# Patient Record
Sex: Male | Born: 1935 | Race: White | Hispanic: No | Marital: Married | Smoking: Former smoker
Health system: Southern US, Community
[De-identification: ages and names within clinical notes are randomized; demographics above are authoritative.]

## PROBLEM LIST (undated history)

## (undated) DIAGNOSIS — N400 Enlarged prostate without lower urinary tract symptoms: Secondary | ICD-10-CM

## (undated) DIAGNOSIS — R29898 Other symptoms and signs involving the musculoskeletal system: Secondary | ICD-10-CM

## (undated) DIAGNOSIS — M51379 Other intervertebral disc degeneration, lumbosacral region without mention of lumbar back pain or lower extremity pain: Secondary | ICD-10-CM

## (undated) DIAGNOSIS — F0150 Vascular dementia without behavioral disturbance: Secondary | ICD-10-CM

## (undated) DIAGNOSIS — J309 Allergic rhinitis, unspecified: Secondary | ICD-10-CM

## (undated) DIAGNOSIS — E785 Hyperlipidemia, unspecified: Secondary | ICD-10-CM

## (undated) DIAGNOSIS — I10 Essential (primary) hypertension: Secondary | ICD-10-CM

## (undated) DIAGNOSIS — I2510 Atherosclerotic heart disease of native coronary artery without angina pectoris: Secondary | ICD-10-CM

## (undated) DIAGNOSIS — Z973 Presence of spectacles and contact lenses: Secondary | ICD-10-CM

## (undated) DIAGNOSIS — I639 Cerebral infarction, unspecified: Secondary | ICD-10-CM

## (undated) DIAGNOSIS — E119 Type 2 diabetes mellitus without complications: Secondary | ICD-10-CM

## (undated) DIAGNOSIS — Z951 Presence of aortocoronary bypass graft: Secondary | ICD-10-CM

## (undated) DIAGNOSIS — H9190 Unspecified hearing loss, unspecified ear: Secondary | ICD-10-CM

## (undated) DIAGNOSIS — M5137 Other intervertebral disc degeneration, lumbosacral region: Secondary | ICD-10-CM

## (undated) HISTORY — PX: EYE SURGERY: SHX253

## (undated) HISTORY — DX: Hyperlipidemia, unspecified: E78.5

## (undated) HISTORY — PX: OTHER SURGICAL HISTORY: SHX169

## (undated) HISTORY — DX: Other intervertebral disc degeneration, lumbosacral region: M51.37

## (undated) HISTORY — PX: LAMINECTOMY: SHX219

## (undated) HISTORY — DX: Type 2 diabetes mellitus without complications: E11.9

## (undated) HISTORY — PX: HEMORRHOID SURGERY: SHX153

## (undated) HISTORY — DX: Atherosclerotic heart disease of native coronary artery without angina pectoris: I25.10

## (undated) HISTORY — DX: Allergic rhinitis, unspecified: J30.9

## (undated) HISTORY — DX: Cerebral infarction, unspecified: I63.9

## (undated) HISTORY — PX: TRANSURETHRAL RESECTION OF PROSTATE: SHX73

## (undated) HISTORY — PX: ROTATOR CUFF REPAIR: SHX139

## (undated) HISTORY — PX: COLONOSCOPY: SHX174

## (undated) HISTORY — DX: Other intervertebral disc degeneration, lumbosacral region without mention of lumbar back pain or lower extremity pain: M51.379

## (undated) HISTORY — DX: Benign prostatic hyperplasia without lower urinary tract symptoms: N40.0

## (undated) HISTORY — PX: TONSILLECTOMY: SUR1361

## (undated) HISTORY — DX: Essential (primary) hypertension: I10

## (undated) HISTORY — DX: Other symptoms and signs involving the musculoskeletal system: R29.898

---

## 2001-05-11 ENCOUNTER — Encounter: Payer: Self-pay | Admitting: Urology

## 2001-05-18 ENCOUNTER — Encounter (INDEPENDENT_AMBULATORY_CARE_PROVIDER_SITE_OTHER): Payer: Self-pay | Admitting: Specialist

## 2001-05-18 ENCOUNTER — Inpatient Hospital Stay (HOSPITAL_COMMUNITY): Admission: RE | Admit: 2001-05-18 | Discharge: 2001-05-20 | Payer: Self-pay | Admitting: Urology

## 2001-05-31 ENCOUNTER — Encounter: Admission: RE | Admit: 2001-05-31 | Discharge: 2001-08-29 | Payer: Self-pay | Admitting: *Deleted

## 2004-10-12 DIAGNOSIS — Z951 Presence of aortocoronary bypass graft: Secondary | ICD-10-CM

## 2004-10-12 HISTORY — PX: CORONARY ARTERY BYPASS GRAFT: SHX141

## 2004-10-12 HISTORY — DX: Presence of aortocoronary bypass graft: Z95.1

## 2005-04-08 ENCOUNTER — Encounter: Admission: RE | Admit: 2005-04-08 | Discharge: 2005-04-08 | Payer: Self-pay | Admitting: *Deleted

## 2005-04-09 ENCOUNTER — Inpatient Hospital Stay (HOSPITAL_COMMUNITY): Admission: AD | Admit: 2005-04-09 | Discharge: 2005-04-21 | Payer: Self-pay | Admitting: *Deleted

## 2005-11-30 ENCOUNTER — Encounter: Admission: RE | Admit: 2005-11-30 | Discharge: 2005-11-30 | Payer: Self-pay | Admitting: Neurology

## 2005-12-25 ENCOUNTER — Encounter: Admission: RE | Admit: 2005-12-25 | Discharge: 2006-01-08 | Payer: Self-pay | Admitting: Neurology

## 2007-05-24 ENCOUNTER — Emergency Department (HOSPITAL_COMMUNITY): Admission: EM | Admit: 2007-05-24 | Discharge: 2007-05-24 | Payer: Self-pay | Admitting: Emergency Medicine

## 2007-06-17 ENCOUNTER — Encounter: Admission: RE | Admit: 2007-06-17 | Discharge: 2007-06-17 | Payer: Self-pay | Admitting: Internal Medicine

## 2011-02-27 NOTE — Consult Note (Signed)
NAME:  [name], [name]:  [id number]   MEDICAL RECORD NO.:  [id number]          PATIENT TYPE:  OIB   LOCATION:  2899                         FACILITY:  [facility]   PHYSICIAN:  Kerin Perna, M.D.  DATE OF BIRTH:  [birth year]   DATE OF CONSULTATION:  04/09/2005  DATE OF DISCHARGE:                                   CONSULTATION   PHYSICIAN REQUESTING CONSULTATION:  Meade Maw, M.D.   PRIMARY CARE PHYSICIAN:  Thora Lance, M.D.   REASON FOR CONSULTATION:  Exertional class III angina with severe three-  vessel coronary artery disease and preserved left ventricular function.   CHIEF COMPLAINT:  Shortness of breath.   HISTORY OF PRESENT ILLNESS:  I was asked to evaluate this 77 year old white  male for potential coronary revascularization for recently diagnosed severe  three-vessel disease.  The patient presented to his physician with a  complaint of dyspnea on exertion.  This has been increasingly severe with  activities such has yard work and walking his dog.  His most severe episode  was when he rushed home due to rain while he was out walking.  He had no  associated chest pain, nausea, diaphoresis, syncope or symptoms of CHF.  He  has had no resting symptoms of shortness of breath.  His coronary risk  factors are positive for diet-controlled diabetes, hyperlipidemia,  hypertension and a remote history of smoking (he stopped in 1958).  He is  involved in a regular exercise program and has remained well conditioned  over the years.   PAST MEDICAL HISTORY:  1.  Hypertension.  2.  Diet-controlled diabetes.  3.  Dyslipidemia.   ALLERGIES:  No known drug allergies.   PAST SURGICAL HISTORY:  1.  Left rotator cuff surgery.  2.  Lumbar disk laminectomy.  3.  Inguinal hernia repair.  4.  Bilateral carpal tunnel repair.  5.  Hemorrhoidectomy.  6.  TURP for benign prostatic hypertrophy.   SOCIAL HISTORY:  The patient is a retired Quarry manager for  [employer].  He  is married.  He stopped smoking in 1958.  He does not use alcohol.  He is a  previous Engineer, production as well.   FAMILY HISTORY:  Positive for colon cancer and diabetes.  Negative for CAD.   REVIEW OF SYSTEMS:  CONSTITUTIONAL:  Review is negative for fever or weight  loss.  EARS, NOSE, AND THROAT:  Review is negative for differential  swallowing.  He has upper dental plates and partial lower plate.  THORACIC:  Review is negative for trauma or abnormal chest x-ray.  Positive for  shortness of breath.  Negative for hemoptysis.  CARDIAC:  Review is positive  for severe three-vessel coronary disease.  No prior history of MI or cardiac  murmur.  GASTROINTESTINAL:  Review is negative for blood per rectum,  hepatitis or jaundice.  UROLOGIC:  Review is positive for BPH, much improved  with TURP.  ENDOCRINE:  Review is positive for diet-controlled diabetes.  Negative for thyroid disease.  HEMATOLOGIC:  Review is negative for bleeding  disorder or blood transfusion.  MUSCULOSKELETAL:  Review is positive for  bilateral carpal tunnel syndrome treated surgically and bilateral rotator  cuff syndromes treated with surgery on the left and physical therapy on the  right.  VASCULAR:  Review is negative for DVT or claudication.  NEUROLOGIC:  Review is negative for stroke or seizure.   PHYSICAL EXAMINATION:  HEIGHT:  The patient is 5 feet 10 inches.  WEIGHT:  185 pounds.  VITAL SIGNS:  The blood pressure today is 168/74, pulse 75, respirations 18,  temperature 97.7 degrees and oxygen saturation on room air 96%.  GENERAL APPEARANCE:  A pleasant white male in the catheterization laboratory  holding area in no distress.  HEENT:  Normocephalic.  EOMs full.  Pharynx clear.  Dentition good.  NECK:  Without JVD, mass or carotid bruit.  LYMPHATICS:  No palpable supraclavicular or axillary adenopathy.  LUNGS:  Clear.  There is no thoracic deformity.  CARDIAC:  Regular rhythm without S3,  gallop, murmur or rub.  ABDOMEN:  Soft without pulsatile mass.  EXTREMITIES:  No cyanosis, clubbing or edema.  Peripheral pulses are 2+ in  all extremities.  SKIN:  Without rash or lesion.  NEUROLOGIC:  Alert and oriented without focal deficit.   LABORATORY DATA:  I reviewed the coronary arteriograms with Dr. Fraser Din and  agree with the impression of three-vessel coronary artery disease with total  occlusion of the right coronary and high-grade stenosis of the LAD, diagonal  and circumflex vessels.   His hemoglobin A1c is 7.7.  His BUN and creatinine are 11 and 0.7.  Hematocrit 41%.   IMPRESSION AND PLAN:  The patient has severe coronary artery disease and has  restricted activity levels from his exertional dyspnea, which is anginal  equivalent.  He would benefit from surgical revascularization to preserve LV  function and to improve his symptoms and long-term survival.  He is  agreeable to proceed with surgery, which  will be scheduled for Monday, April 13, 2005.  I have discussed the  indications, alternatives and risks of surgery with the patient.  He  understands and agrees to proceed.   Thank you for the consultation.       PV/MEDQ  D:  04/09/2005  T:  04/09/2005  Job:  [id number]   cc:   CVTS Office   Sigmund I. Patsi Sears, M.D.  [address]  [place]  [place] [address]  Fax: [phone]   [facility]

## 2011-02-27 NOTE — Consult Note (Signed)
NAME:  Jonathan [name], Jonathan [name] NO.:  [id number]   MEDICAL RECORD NO.:  [id number]          PATIENT TYPE:  INP   LOCATION:  2006                         FACILITY:  [facility]   PHYSICIAN:  Lebron Conners, M.D.   DATE OF BIRTH:  [birth year]   DATE OF CONSULTATION:  04/17/2005  DATE OF DISCHARGE:                                   CONSULTATION   REASON FOR CONSULTATION:  Gallstones.   HISTORY OF PRESENT ILLNESS:  Jonathan [name] is a 77 year old male patient who  underwent coronary artery bypass grafting on April 13, 2005.  Prior to  surgery, he initially complained of dyspnea on exertion.  He then underwent  a stress test followed by cardiac catheterization which resulted in his  bypass surgery.  Since surgery, the patient complains of abdominal pain,  more diffuse and nonspecific.  He did undergo an ultrasound today that  revealed cholelithiasis but no evidence of cholecystitis.  In addition, the  patient continued to have a postoperative fever with a chest x-ray showing  left basal atelectasis versus pneumonia in association with leukocytosis.  Currently, he is on Maxipime.   ALLERGIES:  No known drug allergies.   MEDICATIONS:  Aspirin, Maxipime, insulin, Prinivil 5 mg daily, Lopressor 50  mg b.i.d., Protonix 40 mg daily.   SOCIAL HISTORY:  Former tobacco, no illicit drug use or alcohol use.  He is  married, retired from [employer], lives in [place].   FAMILY HISTORY:  Diabetes mellitus and colon cancer.   PAST MEDICAL HISTORY:  Coronary artery disease as above, diabetes mellitus,  hypertension, dyslipidemia, BPH.   PAST SURGICAL HISTORY:  TURP, left rotator cuff surgery, lumbar laminectomy,  inguinal hernia repair, bilateral carpal tunnel surgery as well as  hemorrhoidectomy.   REVIEW OF SYMPTOMS:  Tenderness along the incision site, abdominal symptoms  as above, otherwise negative.   LABORATORY DATA:  LFTs normal, BMP normal.  Hemoglobin 10.4, hematocrit  30.1, white  count 16.7 (decreasing).  Chest x-ray reveals wet basilar  atelectasis versus pneumonia.   PHYSICAL EXAMINATION:  VITAL SIGNS:  Temperature 101.l, pulse 115, respirations 20, blood pressure  152/89, O2 saturation is 93% on room air.  HEENT:  No nares discharge.  Conjunctivae normal, sclerae clear.  NECK:  No carotid bruits, no JVD, no thyromegaly.  HEART:  Regular rate and rhythm, no murmurs, rubs, or ectopy.  CHEST:  Crackles in the bases bilaterally.  ABDOMEN:  Soft, there is some right upper quadrant tenderness, although he  seems to be diffusely tender.  EXTREMITIES:  No peripheral edema.  NEUROLOGICAL:  Grossly intact.  SKIN:  Warm and dry.  Incision of the sternum is healing without problems.   ASSESSMENT AND PLAN:  1.  Asymptomatic cholelithiasis.  2.  Fever.  3.  Leukocytosis.  4.  Left lung pneumonia.  5.  Coronary artery disease status post ABG.  6.  Hypertension.  7.  Hyperlipidemia.  8.  Diabetes mellitus.  9.  History of BPH status post TURP.  10. Status post left rotator cuff surgery.  11. Status post lumbar laminectomy.  12. Inguinal hernia repair.  13. Bilateral carpal tunnel surgeries.  14. History of hemorrhoidectomy.   The patient was seen and examined by Dr. Lebron Conners.  At this point, we  will continue to follow the patient while he is an inpatient to see if his  symptoms change, however, we would plan on seeing the patient as an  outpatient on May 18, 2005, at 9 a.m.  At this point, he will hopefully  recover from his pneumonia and be far enough out from his bypass surgery  that we could go ahead and proceed with cholecystectomy.  If the patient  becomes symptomatic, we can address taking him to the operating room prior  to this time.  Thank you for the consult.       LB/MEDQ  D:  04/17/2005  T:  04/17/2005  Job:  [id number]

## 2011-02-27 NOTE — Op Note (Signed)
NAME:  Jonathan [name], Jonathan [name] NO.:  [id number]   MEDICAL RECORD NO.:  [id number]          PATIENT TYPE:  INP   LOCATION:  2309                         FACILITY:  [facility]   PHYSICIAN:  Kerin Perna, M.D.  DATE OF BIRTH:  Oct 27, 1928   DATE OF PROCEDURE:  04/13/2005  DATE OF DISCHARGE:                                 OPERATIVE REPORT   OPERATION:  Coronary artery bypass grafting x 4 (left internal mammary  artery to LAD, saphenous vein graft to poster descending, sequential  saphenous vein graft to OM1 and the OM2).   PRE AND POSTOPERATIVE DIAGNOSIS:  Class IV unstable angina with severe three-  vessel coronary disease.   SURGEON:  Kerin Perna, M.D.   ASSISTANT:  Coral Ceo, PA-C   ANESTHESIA:  General by Dr. Adonis Huguenin   INDICATIONS:  The patient is a 77 year old diabetic who presents with  unstable angina. Cardiac catheterization by Dr. Fraser Din demonstrated severe  three-vessel coronary disease including total occlusion of the right  coronary and 95% stenosis of the LAD. He was not felt to be candidate for  percutaneous intervention and surgical evaluation was requested.   Prior to surgery, I examined the patient in his hospital room and reviewed  the results of the cardiac catheterization with the patient and family. I  discussed the indications and expected benefits of coronary bypass surgery  for treatment of his coronary artery disease. I reviewed the major aspects  of planned procedure including choice of conduit for grafts, location of the  surgical incisions, use of general anesthesia and cardiopulmonary bypass,  and expected postoperative hospital recovery. I discussed with the patient  the risks to him of coronary bypass surgery as well as the alternatives to  surgical therapy. I discussed with him the potential risks of MI, CVA,  bleeding, infection, blood transfusion requirement, and death. He understood  these implications for the surgery and  agreed to proceed with operation as  planned under what I felt was an informed consent.   OPERATIVE FINDINGS:  The mammary artery was a small vessel but was trimmed  proximally to allow adequate flow. The LAD had proximal high-grade calcified  lesions. The circumflex was intramyocardial. The right coronary was totally  occluded but the posterior descending was adequate target. The vein was  harvested endoscopically and was good conduit. No blood transfusions  required. The aprotinin protocol was used for this operation.   PROCEDURE:  The patient was brought to operative placed on the operating  room table. General anesthesia was induced under invasive hemodynamic  monitoring. The chest, abdomen and legs were prepped with Betadine and  draped as a sterile field. A sternal incision was made as the saphenous vein  was harvested endoscopically. Left internal mammary artery was harvested as  a pedicle graft from its origin at the subclavian vessels. Heparin was  administered and ACT was documented as being therapeutic. The sternal  retractor was placed and the pericardium was opened and suspended.  Pursestrings placed in the ascending aorta and right atrium and the patient  was cannulated and placed on bypass. The  coronaries were identified for  grafting and the mammary artery and vein grafts were prepared for the distal  anastomoses. Cardioplegia catheters were placed for both antegrade aortic  and retrograde coronary sinus cardioplegia. The patient was cooled to 32  degrees. Aortic crossclamp was applied. 800 mL of cold blood cardioplegia  was delivered in split doses between the antegrade aortic and retrograde  coronary sinus catheters. There is good cardioplegic arrest and septal  temperature dropped less than 12 degrees. Topical iced saline was used to  augment myocardial preservation and a pericardial insulator pad was used  protect left phrenic nerve.   The distal coronary anastomoses  were then performed. The first distal  anastomosis was to the posterior descending. This was a 1.7 mm vessel with  proximal total occlusion. Reverse saphenous vein was sewn end-to-side with  running 7-0 Prolene with good flow through graft. The second distal  anastomosis was the OM1. There is a small 1.5 mm vessel proximal 90%  stenosis. A side-to-side anastomosis with the vein was sewn using running 7-  0 Prolene. The third distal anastomosis was continuation of the sequential  vein graft to the OM2. The OM2 was a larger 1.7 mm vessel with proximal 90%  stenosis.  The end of the saphenous vein was sewn end-to-side with running 7-  0 Prolene and there was good flow through graft. Cardioplegia was redosed.  The fourth distal anastomosis was placed in the mid LAD beyond the high-  grade calcified stenosis. Left internal mammary artery pedicle was trimmed  back to an adequate size and flow and was sewn end-to-side with running 8-0  Prolene. The mammary pedicle had been placed through an opening in the left  lateral pericardium and brought down onto the LAD. The mammary artery was  briefly flashed by releasing the pedicle bulldog and there was a median  increase in septal temperature. The bulldog was replaced in the pericardium  and the pedicle was secured to the epicardium. Cardioplegia was redosed.   With  still in place, two proximal vein anastomoses were placed in the  ascending aorta using a 4.5 mm punch with running 6-0 Prolene. Prior to  release of the crossclamp, air was removed from the coronaries and left side  of the heart using a dose of retrograde warm blood cardioplegia and the  usual de-airing maneuvers. The head was dropped in deep Trendelenburg and  the crossclamp was removed as the proximal anastomosis was tied.   The heart was cardioverted back to a regular rhythm after air was aspirated  from the vein grafts with 27 gauge needle. The retrograde cardioplegia catheter was  removed. The patient was rewarmed and reperfused. Temporary  pacing wires were applied. The lungs re-expanded and the ventilator was  resumed. The patient was weaned from bypass without inotropes. Blood  pressure and cardiac output were stable. Protamine was administered without  adverse reaction. The cannulas were removed. The mediastinum was irrigated  with warm antibiotic irrigation. Leg incision was irrigated and closed in  standard fashion. The pericardium was loosely reapproximated. Two  mediastinal and a left pleural chest tube were placed and brought out  through separate incisions. The sternum was closed with interrupted steel  wire. The pectoralis fascia was closed in running #1 Vicryl. The  subcutaneous and skin were closed in running Vicryl and sterile dressings  were applied. Total bypass time was 108 minutes with crossclamp time of 70  minutes.       PV/MEDQ  D:  04/13/2005  T:  04/13/2005  Job:  [id number]   cc:   [facility]

## 2011-02-27 NOTE — H&P (Signed)
[facility]  Patient:    Jonathan [name], Jonathan [name]                     MRN: [id number] Adm. Date:  [id number] Attending:  Lauree Chandler                         History and Physical  HISTORY OF PRESENT ILLNESS:  This 77 year old white male has had a long history of bladder outlet obstructive symptoms.  He has been treated with Proscar and his prostate has shrunk significantly from 60 g down to 43 g, but despite that he still has significant bladder outlet obstructive symptoms.  He has bilobar hypertrophy on cystoscopy.  The PSA in April of this year was 0.59, but doubled because he was on Proscar and made it 1.18, which was just fine.  He has had multiple discussions with me about interventional procedures and he chose TUR of the prostate.  PAST MEDICAL HISTORY:  His past history includes recent onset of diabetes and he is now on Amaryl for that.  He watches his diet.  He also has a history of hypertension.  He has arthritis.  PAST SURGICAL HISTORY:  Previous surgeries include left inguinal hernia repair, right shoulder surgery, tonsillectomy, hemorrhoidectomy,  lumbar disk surgery, and bilateral carpal tunnel surgery.  MEDICATIONS: 1. Cozaar 50 mg daily. 2. Proscar 5 mg daily. 3. Aspirin which he recently discontinued. 4. Now he is on Amaryl 1 mg a day for diabetes.  ALLERGIES:  None.  SOCIAL HISTORY:  Tobacco:  None.  Alcohol:  Occasional.  FAMILY HISTORY:  Noncontributory.  REVIEW OF SYSTEMS:  Noted on health history form.  PHYSICAL EXAMINATION:  His blood pressure is 170/90, pulse 80, temperature 98.1 degrees, and respiratory rate 16.  GENERAL APPEARANCE:  He is alert and oriented.  He is in no acute distress.  SKIN:  Warm and dry.  NECK:  Supple.  CHEST:  Clear.  HEART:  Heart tones regular.  ABDOMEN:  Soft and nontender.  PELVIC:  External genitalia unremarkable.  RECTAL:  The prostate feels benign and smooth.  IMPRESSION: 1.  Benign prostatic hypertrophy. 2. Hypertension. 3. Diabetes mellitus. 4. Arthritis. DD:  05/18/01 TD:  05/18/01 Job: [id number]

## 2011-02-27 NOTE — Op Note (Signed)
[facility]  Patient:    Jonathan [name], Jonathan [name]                     MRN: [id number] Proc. Date: 05/18/01 Adm. Date:  [id number] Attending:  Lauree Chandler                           Operative Report  PREOPERATIVE DIAGNOSES:  Benign prostatic hypertrophy and bladder outlet obstruction.  POSTOPERATIVE DIAGNOSES:  Benign prostatic hypertrophy and bladder outlet obstruction.  PROCEDURE:  Transurethral resection of prostate.  SURGEON:  Maretta Bees. Vonita Moss, M.D.  ANESTHESIA:  General.  INDICATIONS:  This gentleman has had a long history of bladder outlet obstructive symptoms and has been on Proscar.  Flomax has not helped him very much.  He is interested in an interventional procedure and has opted for TUR of the prostate.  DESCRIPTION OF PROCEDURE:  The patient was brought to the operating room and placed in the lithotomy position.  After the induction of satisfactory general anesthesia, external genitalia were prepped and draped in the usual fashion. He was sounded from 70 to 44 [place].  A 28 French resectoscope sheath was then inserted.  The bladder had trabeculation but no stones or tumors.  He had bilobar hypertrophy.  A resection was performed of both lateral lobes. Despite being on Proscar, which had shrunk his prostate to 7 degrees, the prostate was still quite vascular.  Meticulous hemostasis was utilized, and the lateral lobes were resected down to capsule.  Prostatic floor was resected as was the very anterior wall.  This created a well-excavated prostatic fossa. Residual chips were removed from the bladder.  Ureteral orifices were intact. The external sphincter was intact, and there was good hemostasis, and the scope was removed.  A 24 French 30 cc Foley was placed in the bladder with clear irrigation after filling the balloon with 45 cc and placing it on traction.  He was taken to the recovery room in good condition.  Estimated blood loss was  less than 200 cc.  He tolerated the procedure well. DD:  05/18/01 TD:  05/18/01 Job: [id number]

## 2011-02-27 NOTE — Discharge Summary (Signed)
[facility]  Patient:    Jonathan [name], Jonathan [name]                     MRN: [id number] Adm. Date:  [id number] Disc. Date: [id number] Attending:  Lauree Chandler                           Discharge Summary  FINAL DIAGNOSES: 1. Benign prostatic hypertrophy and prostatism. 2. Hypertension. 3. Diabetes mellitus. 4. Arthritis.  PROCEDURE:  Transurethral resection of prostate.  HISTORY OF PRESENT ILLNESS:  This 77 year old white male has had a long history of bladder outlet obstructive symptoms.  He has been on Proscar, but still has voiding difficulties.  His PSA has been normal.  He also has had some recent onset of diabetes and a longer history of hypertension and arthritis.  PHYSICAL EXAMINATION:  Noncontributory.  His prostate felt benign.  HOSPITAL COURSE:  After admission, he underwent transurethral resection of prostate.  He had some bladder spasms postoperatively.  They were relieved when the Foley catheter was removed the day of discharge.  He will be discharged post-void.  If he has any problems with voiding, he will go home with a Foley catheter in place and have it removed in a couple of days.  DISCHARGE MEDICATIONS: 1. Cozaar 50 mg q.d. 2. He will finish up the last few Proscar tablets that he has. 3. He will continue on Amaryl 1 mg q.d. for diabetes.  DIET:  Continue diabetic diet.  ACTIVITY:  Light activity for a month.  CONDITION ON DISCHARGE:  He was sent home in good condition.  Final pathology was benign prostatic hypertrophy. DD:  05/20/01 TD:  05/20/01 Job: [id number]

## 2011-02-27 NOTE — Discharge Summary (Signed)
NAME:  Jonathan [name], Jonathan [name] NO.:  [id number]   MEDICAL RECORD NO.:  [id number]          PATIENT TYPE:  INP   LOCATION:  2006                         FACILITY:  [facility]   PHYSICIAN:  Kerin Perna, M.D.  DATE OF BIRTH:  09-03-1929   DATE OF ADMISSION:  04/09/2005  DATE OF DISCHARGE:                                 DISCHARGE SUMMARY   PRIMARY DIAGNOSIS:  Questionable unstable angina with severe three-vessel  coronary artery disease.   IN-HOSPITAL DIAGNOSES:  1.  Postoperative pneumonia.  2.  Cholelithiasis.  3.  Volume overload.   SECONDARY DIAGNOSES:  1.  Hypertension.  2.  Diet-controlled diabetes.  3.  Dyslipidemia.   ALLERGIES:  No known drug allergies.   IN-HOSPITAL OPERATIONS AND PROCEDURES:  1.  Cardiac catheterization.  2.  Coronary artery bypass grafting times four using a left internal mammary      artery to the left anterior descending, saphenous vein graft to the      posterior descending artery, sequential saphenous vein graft to the      first obtuse marginal and second obtuse marginal.   HISTORY AND PHYSICAL/HOSPITAL COURSE:  Jonathan [name] is a pleasant 77 year old  gentleman who presented to his physician with a complaint of dyspnea on  exertion. He says it is increasingly severe with activity just as yardwork  and walking. His most severe episode is when he rushed home due to rain when  he was out walking. He had no associated chest pain, nausea, diaphoresis,  syncope, or symptoms of seizure.  He has had no resting symptoms of  shortness of breath. His coronary risk factors are positive for diet-  controlled diabetes, hyperlipidemia, hypertension, and a remote history of  smoking which he stopped in 1958. He is involved in a regular exercise  program and has remained in well condition over the years. He underwent  cardiac catheterization on April 09, 2005, by Dr. Fraser Din which showed no  significant disease in the left  main artery. The left  anterior descending  revealed significant calcification in the proximal to mid portion. There is  one diffuse lesion in the LAD involving the proximal to mid portion. This  includes a small D-1, the second and third septal perforator. The circumflex  vessel had a large focal disease of 90%. The first lesion 90% is proximal  and involves the first obtuse marginal. The second lesion is in the mid  portion of the circumflex and involves the large second obtuse marginal.  There are left to right collaterals to the distal right coronary artery, is  100% proximally occluded. Ejection fraction is measured at 60% to 65%. Dr.  Donata Clay was then consulted. Dr. Donata Clay discussed undergoing coronary  artery bypass grafting for these lesions. He discussed risks and benefits of  the procedure. The patient acknowledged understanding and wished to proceed.  His surgery was then scheduled for April 13, 2005. He underwent preoperative  bilateral carotid duplex ultrasound revealing no evidence of ICA stenosis on  the right or left.  He also underwent preoperative ABIs which showed to be  within  normal limits. No complications during the patient's hospital stay  prior to undergoing surgery. He was taken to the operating room on April 13, 2005, where he underwent coronary artery bypass grafting times four with the  left internal mammary artery to the left anterior descending, saphenous vein  graft to the posterior descending artery, and sequential saphenous vein  grafting to the first branch of the obtuse marginal and second branch of the  obtuse marginal. The patient tolerated this procedure well and was  transferred up to the intensive care unit in stable condition. He was  extubated by the evening or early morning following surgery. He was deemed  to be hemodynamically stable in the immediate postoperative period. On  postoperative day one, the patient remained stable. Hematocrit was 27%,  creatinine stable  at 0.8. The patient was out of bed to chair. Lines and  chest tubes were discontinued. On postoperative day two the patient was  progressing well. He did show a low grade temperature of 99 degrees  Fahrenheit. White blood cell count was increased to 18.  The patient was  started on Ceftin prophylactically.  He was transferred out to 2000.  On  postoperative day three, the patient's white count increased further to 22.  He remained to have a low grade temperature of 99.2. Ceftin was continued.  Chest x-ray was ordered as well as UA and urine culture, and this was  followed. The patient was out of bed and ambulating well. Incisions were dry  and intact, healing well. The patient remained going in and out of sinus  tachycardia. CBGs were controlled with sliding scale and Lantus. On  postoperative day four, the patient had complaints of diaphoresis and nausea  post meals. He also complained of right upper quadrant pain. Ultrasound of  the gallbladder was done showed positive for cholelithiasis. His white count  did go down to 16.7, but he remained febrile at 101.1. General surgery was  consulted for evaluation. Chest x-ray did show questionable left lower lobe  pneumonia. General surgery evaluated patient on April 17, 2005, and felt that  he did not have cholecystitis and that the cholelithiasis can be taken care  of an outpatient and that his fever and white count was related to his  pneumonia. A follow-up appointment was scheduled with Dr. Orson Slick, per Dr.  Orson Slick. On postoperative day five, the patient was feeling better. He was  out of bed, walking around without difficulty. Appetite was improving. CBGs  were better controlled. White count was down to 13.9.  he did remain febrile  at 101.1. He was saturating at 91% on room air. Incisions were dry, intact,  and healing well. The patient was continued on antibiotics. The patient was  continued on antibiotics. On postoperative day six, the patient  was progressing well. White count was decreased to 13.3. Nausea had improved.  The patient was progressing tolerating diet well. Chest x-ray was improving.  On postoperative day seven, white blood cell count was within normal limits.  The patient was afebrile. He did remain in sinus tachycardia and Lopressor  was increased. The patient's CBGs were slightly under control, but diabetic  case management was consulted for suggestion of home p.o. medications.  Antibiotics were discontinued. The patient was out of bed and ambulating  well. The patient was discharged home on postoperative day eight in stable  condition. Incisions were dry, intact, and healing well. He remained in  regular rate and rhythm at discharge. He was 93%  to 94% on room air.  He was  tolerating a regular diet well with no nausea and no vomiting. He was out of  bed and ambulating well. A follow-up appointment will be scheduled with Dr.  Donata Clay in three weeks. The patient will follow up with Dr. Fraser Din in two  weeks. He will obtain a PA and lateral chest x-ray in Dr. Lindell Spar office  which he will then bring with him to Dr. Zenaida Niece Trigt's appointment. Again, he  is to follow up with Dr. Orson Slick, per Dr. Orson Slick.   The patient received instructions on diet, activity level, and incisional  care. He was told no driving until released to do so and no heavy lifting  over 10 pounds. He is told to ambulate three to four times a day as  tolerated. He is educated on a low-fat, low-salt diet. The patient is to  continue doing his breathing exercises. He is allowed to shower, washing his  incisions using soap and water. He is to contact the office he develops any  drainage or opening from his incision site. The patient acknowledged  understanding. The patient will have diabetic case management evaluate prior  to discharge for questionable p.o. medications.   DISCHARGE MEDICATIONS:  1.  Aspirin 325 mg p.o. daily.  2.  Lopressor 100  mg p.o. b.i.d.  3.  Lisinopril 5 mg p.o. daily.  4.  Lipitor 40 mg p.o. q.h.s.  5.  Mucinex 600 mg b.i.d.  6.  Tylox one to two tabs one to two tabs p.r.n. pain.   Again questionable diabetic medication which the patient will be told to  inform physicians of.       KMD/MEDQ  D:  04/20/2005  T:  04/20/2005  Job:  [id number]   cc:   Meade Maw, M.D.  [address]  [place]  [place] [address]  Fax: [phone]   Lorre Munroe., M.D.  [address], [address]  [place]  [place] [address]

## 2011-02-27 NOTE — Cardiovascular Report (Signed)
NAME:  Jonathan [name], Jonathan [name] NO.:  [id number]   MEDICAL RECORD NO.:  [id number]          PATIENT TYPE:  OIB   LOCATION:  2899                         FACILITY:  [facility]   PHYSICIAN:  Meade Maw, M.D.    DATE OF BIRTH:  [birth year]   DATE OF PROCEDURE:  DATE OF DISCHARGE:                              CARDIAC CATHETERIZATION   REFERRING PHYSICIAN:  Georgann Housekeeper, M.D.   INDICATION FOR PROCEDURE:  Inferolateral ischemia noted on Cardiolite.   DESCRIPTION OF PROCEDURE:  After obtaining written informed consent, the  patient was brought to the cardiac catheterization lab in a post absorptive  state.  Preop sedation was achieved using IV Versed.  The right groin was  prepped and draped in the usual sterile fashion and local anesthesia was  achieved using 1% Xylocaine.  A 6-French hemostasis sheath was placed into  the right femoral artery using a modified Seldinger technique. Selective  coronary angiography was performed using a JL-4 and JR-4 Judkins catheter.  Multiple views were obtained.  All catheter exchanges were made over a  guidewire.  Single-plane ventriculogram was performed in the RAO position  using a 6-French pigtail curved catheter.  The left internal mammary artery  was engaged using the JR-4 Judkins catheter.  The patient was then  transferred to the holding area.  Hemostasis sheath was removed.  Hemostasis  was achieved using a FemoStop device, CVTS consult was obtained.   FINDINGS:  The aortic pressure is 118/65, LV pressure is 127/11, EDP is 14.  Single plane ventriculogram revealed normal wall motion and ejection  fraction of 65%.  There was mild mitral regurgitation noted only.   CORONARY ANGIOGRAPHY:  The left main coronary artery bifurcates into the  left anterior descending and circumflex vessel.  The left main coronary  artery is short.  There is no significant disease noted in the left main  coronary artery.   The left anterior descending  reveals significant calcification in the  proximal to midportion of the LAD.  There is a long diffuse lesion in the  LAD, involving the proximal to midportion.  This includes a small D1, the  second and third septal perforator.  The distal left anterior descending  remains patent and a good target.   The circumflex vessel is a large caliber vessel and has a discrete focal  disease of 90%.  The first 90% lesion is proximal and involves the first  obtuse marginal.  The second lesion is in the midportion of the circumflex  and involves the large second obtuse marginal.  There are left-to-right  collaterals to the distal right, the right coronary artery.  The right  coronary artery is a 100% proximally occluded with collaterals from the left  system.   FINAL IMPRESSION:  1.  Critical multivessel disease with good distal targets.  2.  Normal left ventricular function with an ejection fraction of 60-65%.  3.  CVTS consult will be recommended.  The patient will be admitted.  He      will be initiated on treatment for his dyslipidemia as well as his  diabetes.       HP/MEDQ  D:  04/09/2005  T:  04/09/2005  Job:  [id number]

## 2011-07-27 LAB — POCT URINALYSIS DIP (DEVICE)
Bilirubin Urine: NEGATIVE
Glucose, UA: NEGATIVE
Hgb urine dipstick: NEGATIVE
Nitrite: NEGATIVE
Operator id: 235561
Urobilinogen, UA: 0.2

## 2011-07-27 LAB — URINALYSIS, ROUTINE W REFLEX MICROSCOPIC
Glucose, UA: NEGATIVE
Specific Gravity, Urine: 1.017
pH: 5.5

## 2013-07-03 ENCOUNTER — Encounter: Payer: Self-pay | Admitting: Interventional Cardiology

## 2013-07-03 ENCOUNTER — Encounter: Payer: Self-pay | Admitting: *Deleted

## 2013-07-14 ENCOUNTER — Ambulatory Visit (INDEPENDENT_AMBULATORY_CARE_PROVIDER_SITE_OTHER): Payer: Medicare Other | Admitting: Interventional Cardiology

## 2013-07-14 ENCOUNTER — Encounter: Payer: Self-pay | Admitting: Interventional Cardiology

## 2013-07-14 VITALS — BP 152/72 | HR 75 | Ht 70.0 in | Wt 175.0 lb

## 2013-07-14 DIAGNOSIS — E782 Mixed hyperlipidemia: Secondary | ICD-10-CM

## 2013-07-14 DIAGNOSIS — I2510 Atherosclerotic heart disease of native coronary artery without angina pectoris: Secondary | ICD-10-CM

## 2013-07-14 DIAGNOSIS — I119 Hypertensive heart disease without heart failure: Secondary | ICD-10-CM | POA: Insufficient documentation

## 2013-07-14 DIAGNOSIS — I10 Essential (primary) hypertension: Secondary | ICD-10-CM

## 2013-07-14 NOTE — Progress Notes (Signed)
Patient ID: Jonathan [name], male   DOB: 07-21-1929, 79 y.o.   MRN: [id number]    [address] [place], [place]  [address] Phone: [phone] Fax:  [phone]  Date:  07/14/2013   ID:  Jonathan [name], DOB 05/11/1929, MRN [id number]  PCP:  No primary provider on file.      History of Present Illness: Jonathan [name] is a 79 y.o. male  with CAD. He had CABG in July 2006. BP at home 130-150/70-85. CAD/ASCVD:  He has some fatigue. Walks/works everyday. No sx like he had before his CABG. Arthritis sx. Denies : Chest pain.  Dyspnea on exertion.  Leg edema.  Nitroglycerin.  Orthopnea.  Palpitations.  Syncope.     Wt Readings from Last 3 Encounters:  07/14/13 175 lb (79.379 kg)     Past Medical History  Diagnosis Date  . Diabetes   . Hypertension   . Hyperlipidemia   . Degeneration of lumbar or lumbosacral intervertebral disc   . BPH (benign prostatic hypertrophy)   . Allergic rhinitis   . Dyslipidemia   . Right leg weakness   . Coronary atherosclerosis of native coronary artery   . CAD (coronary artery disease)     CABG 2006     Current Outpatient Prescriptions  Medication Sig Dispense Refill  . losartan-hydrochlorothiazide (HYZAAR) 100-12.5 MG per tablet        No current facility-administered medications for this visit.    Allergies:    Allergies  Allergen Reactions  . Codeine     Social History:  The patient  reports that he has never smoked. He does not have any smokeless tobacco history on file. He reports that he does not use illicit drugs.   Family History:  The patient's family history includes Hypertension in his father.   ROS:  Please see the history of present illness.  No nausea, vomiting.  No fevers, chills.  No focal weakness.  No dysuria.   All other systems reviewed and negative.   PHYSICAL EXAM: VS:  BP 152/72  Pulse 75  Ht 5\' 10"  (1.778 m)  Wt 175 lb (79.379 kg)  BMI 25.11 kg/m2 Well nourished, well developed, in no  acute distress HEENT: normal Neck: no JVD, no carotid bruits Cardiac:  normal S1, S2; RRR;  Lungs:  clear to auscultation bilaterally, no wheezing, rhonchi or rales Abd: soft, nontender, no hepatomegaly Ext: no edema Skin: warm and dry Neuro:   no focal abnormalities noted  EKG:  NSR, no change since 2013     ASSESSMENT AND PLAN:  Coronary atherosclerosis of native coronary artery  Continue Aspirin Tablet Delayed Release, 81 MG, 1 tablet, Orally, Once a day Diagnostic Imaging:EKG Harward,Amy 07/05/2012 03:13:25 PM > Jennavecia Schwier,JAY 07/05/2012 03:36:39 PM > NSR, inf Q waves  No angina. No SHOB. He remains very active.  2. Mixed hyperlipidemia  Stop WelChol Tablet, 625 MG, 2 tablets, PT STOPPED.   intolerant of statins. He has stopped welchol and feels much better, last LDL 141 in 5/14.  3. Hypertension, essential  Continue Hyzaar Tablet, 100-12.5 MG, 1 tablet, Orally, Once a day Feels better when SBP > 130. Otherwise, feels very fatigued. BP controlled at home.  He checked for Dr. Mahlon Gammon.  HbA1C 6.8 in 5/14. Expressed frustration about his wife's options for AFib.  She went to [facility] and the convergent procedure was not offered.  They offered Tikosyn but she was not interested.    F/U in one year.  Signed, Fredric Mare, MD, [facility] 07/14/2013 9:29 AM

## 2013-07-14 NOTE — Patient Instructions (Addendum)
Your physician recommends that you continue on your current medications as directed. Please refer to the Current Medication list given to you today.  Your physician wants you to follow-up in: 1 Year Follow up with Dr. Eldridge Dace. You will receive a reminder letter in the mail two months in advance. If you don't receive a letter, please call our office to schedule the follow-up appointment.

## 2014-03-30 ENCOUNTER — Encounter (HOSPITAL_BASED_OUTPATIENT_CLINIC_OR_DEPARTMENT_OTHER): Payer: Self-pay | Admitting: *Deleted

## 2014-03-30 NOTE — Progress Notes (Signed)
To come in for bmet-hoh yet very active

## 2014-03-30 NOTE — Progress Notes (Signed)
03/30/14 1438  OBSTRUCTIVE SLEEP APNEA  Have you ever been diagnosed with sleep apnea through a sleep study? No  Do you snore loudly (loud enough to be heard through closed doors)?  0  Do you often feel tired, fatigued, or sleepy during the daytime? 0  Has anyone observed you stop breathing during your sleep? 0  Do you have, or are you being treated for high blood pressure? 1  BMI more than 35 kg/m2? 0  Age over 78 years old? 1  Neck circumference greater than 40 cm/16 inches? 1  Gender: 1  Obstructive Sleep Apnea Score 4  Score 4 or greater  Results sent to PCP

## 2014-04-02 ENCOUNTER — Encounter (HOSPITAL_BASED_OUTPATIENT_CLINIC_OR_DEPARTMENT_OTHER)
Admission: RE | Admit: 2014-04-02 | Discharge: 2014-04-02 | Disposition: A | Discharge status: Home / Self Care | Payer: Medicare Other | Source: Ambulatory Visit | Attending: Orthopedic Surgery | Admitting: Orthopedic Surgery

## 2014-04-02 LAB — BASIC METABOLIC PANEL
BUN: 23 mg/dL (ref 6–23)
CHLORIDE: 99 meq/L (ref 96–112)
CO2: 30 meq/L (ref 19–32)
Calcium: 9.4 mg/dL (ref 8.4–10.5)
Creatinine, Ser: 0.84 mg/dL (ref 0.50–1.35)
GFR calc Af Amer: >90 mL/min (ref >90)
GFR calc non Af Amer: 79 mL/min — ABNORMAL LOW (ref >90)
Glucose, Bld: 145 mg/dL — ABNORMAL HIGH (ref 70–99)
POTASSIUM: 4.7 meq/L (ref 3.7–5.3)
Sodium: 141 mEq/L (ref 137–147)

## 2014-04-03 ENCOUNTER — Other Ambulatory Visit: Payer: Self-pay | Admitting: Orthopedic Surgery

## 2014-04-05 ENCOUNTER — Ambulatory Visit (HOSPITAL_BASED_OUTPATIENT_CLINIC_OR_DEPARTMENT_OTHER)
Admission: RE | Admit: 2014-04-05 | Discharge: 2014-04-05 | Disposition: A | Discharge status: Home / Self Care | Payer: Medicare Other | Source: Ambulatory Visit | Attending: Orthopedic Surgery | Admitting: Orthopedic Surgery

## 2014-04-05 ENCOUNTER — Ambulatory Visit (HOSPITAL_BASED_OUTPATIENT_CLINIC_OR_DEPARTMENT_OTHER): Payer: Medicare Other | Admitting: Anesthesiology

## 2014-04-05 ENCOUNTER — Encounter (HOSPITAL_BASED_OUTPATIENT_CLINIC_OR_DEPARTMENT_OTHER): Payer: Self-pay | Admitting: Orthopedic Surgery

## 2014-04-05 ENCOUNTER — Encounter (HOSPITAL_BASED_OUTPATIENT_CLINIC_OR_DEPARTMENT_OTHER)
Admission: RE | Disposition: A | Discharge status: Home / Self Care | Payer: Self-pay | Source: Ambulatory Visit | Attending: Orthopedic Surgery

## 2014-04-05 ENCOUNTER — Encounter (HOSPITAL_BASED_OUTPATIENT_CLINIC_OR_DEPARTMENT_OTHER): Payer: Medicare Other | Admitting: Anesthesiology

## 2014-04-05 DIAGNOSIS — E119 Type 2 diabetes mellitus without complications: Secondary | ICD-10-CM | POA: Insufficient documentation

## 2014-04-05 DIAGNOSIS — N400 Enlarged prostate without lower urinary tract symptoms: Secondary | ICD-10-CM | POA: Insufficient documentation

## 2014-04-05 DIAGNOSIS — R0989 Other specified symptoms and signs involving the circulatory and respiratory systems: Secondary | ICD-10-CM | POA: Insufficient documentation

## 2014-04-05 DIAGNOSIS — M51379 Other intervertebral disc degeneration, lumbosacral region without mention of lumbar back pain or lower extremity pain: Secondary | ICD-10-CM | POA: Insufficient documentation

## 2014-04-05 DIAGNOSIS — H9190 Unspecified hearing loss, unspecified ear: Secondary | ICD-10-CM | POA: Insufficient documentation

## 2014-04-05 DIAGNOSIS — E785 Hyperlipidemia, unspecified: Secondary | ICD-10-CM | POA: Insufficient documentation

## 2014-04-05 DIAGNOSIS — Z885 Allergy status to narcotic agent status: Secondary | ICD-10-CM | POA: Insufficient documentation

## 2014-04-05 DIAGNOSIS — I10 Essential (primary) hypertension: Secondary | ICD-10-CM | POA: Insufficient documentation

## 2014-04-05 DIAGNOSIS — M5137 Other intervertebral disc degeneration, lumbosacral region: Secondary | ICD-10-CM | POA: Insufficient documentation

## 2014-04-05 DIAGNOSIS — R0609 Other forms of dyspnea: Secondary | ICD-10-CM | POA: Insufficient documentation

## 2014-04-05 DIAGNOSIS — M720 Palmar fascial fibromatosis [Dupuytren]: Secondary | ICD-10-CM | POA: Insufficient documentation

## 2014-04-05 DIAGNOSIS — Z951 Presence of aortocoronary bypass graft: Secondary | ICD-10-CM | POA: Insufficient documentation

## 2014-04-05 DIAGNOSIS — I2510 Atherosclerotic heart disease of native coronary artery without angina pectoris: Secondary | ICD-10-CM | POA: Insufficient documentation

## 2014-04-05 HISTORY — DX: Unspecified hearing loss, unspecified ear: H91.90

## 2014-04-05 HISTORY — PX: DUPUYTREN CONTRACTURE RELEASE: SHX1478

## 2014-04-05 HISTORY — DX: Presence of spectacles and contact lenses: Z97.3

## 2014-04-05 LAB — POCT HEMOGLOBIN-HEMACUE: HEMOGLOBIN: 12.9 g/dL — AB (ref 13.0–17.0)

## 2014-04-05 LAB — GLUCOSE, CAPILLARY: Glucose-Capillary: 159 mg/dL — ABNORMAL HIGH (ref 70–99)

## 2014-04-05 SURGERY — RELEASE, DUPUYTREN CONTRACTURE
Anesthesia: Regional | Site: Hand | Laterality: Left

## 2014-04-05 MED ORDER — PROPOFOL 10 MG/ML IV BOLUS
INTRAVENOUS | Status: DC | PRN
  Administered 2014-04-05: 200 mg via INTRAVENOUS

## 2014-04-05 MED ORDER — PHENYLEPHRINE HCL 10 MG/ML IJ SOLN
INTRAMUSCULAR | Status: DC | PRN
  Administered 2014-04-05: 80 ug via INTRAVENOUS
  Administered 2014-04-05: 40 ug via INTRAVENOUS

## 2014-04-05 MED ORDER — CHLORHEXIDINE GLUCONATE 4 % EX LIQD: 60.0000 mL | Freq: Once | CUTANEOUS | Status: DC

## 2014-04-05 MED ORDER — FENTANYL CITRATE 0.05 MG/ML IJ SOLN
50.0000 ug | INTRAMUSCULAR | Status: DC | PRN
  Administered 2014-04-05: 100 ug via INTRAVENOUS

## 2014-04-05 MED ORDER — CEFAZOLIN SODIUM-DEXTROSE 2-3 GM-% IV SOLR
2.0000 g | INTRAVENOUS | Status: AC
  Administered 2014-04-05: 2 g via INTRAVENOUS

## 2014-04-05 MED ORDER — LIDOCAINE HCL 2 % IJ SOLN
INTRAMUSCULAR | Status: AC
Start: 2014-04-05 — End: 2014-04-05
  Filled 2014-04-05: qty 20

## 2014-04-05 MED ORDER — CEPHALEXIN 500 MG PO CAPS: 500.0000 mg | ORAL_CAPSULE | Freq: Three times a day (TID) | ORAL | Status: DC

## 2014-04-05 MED ORDER — HYDROMORPHONE HCL 2 MG PO TABS: 2.0000 mg | ORAL_TABLET | ORAL | Status: DC | PRN

## 2014-04-05 MED ORDER — FENTANYL CITRATE 0.05 MG/ML IJ SOLN: 25.0000 ug | INTRAMUSCULAR | Status: DC | PRN

## 2014-04-05 MED ORDER — SILVER SULFADIAZINE 1 % EX CREA
TOPICAL_CREAM | CUTANEOUS | Status: AC
  Filled 2014-04-05: qty 85

## 2014-04-05 MED ORDER — 0.9 % SODIUM CHLORIDE (POUR BTL) OPTIME
TOPICAL | Status: DC | PRN
  Administered 2014-04-05: 200 mL

## 2014-04-05 MED ORDER — CEFAZOLIN SODIUM-DEXTROSE 2-3 GM-% IV SOLR
INTRAVENOUS | Status: AC
  Filled 2014-04-05: qty 50

## 2014-04-05 MED ORDER — ROPIVACAINE HCL 5 MG/ML IJ SOLN
INTRAMUSCULAR | Status: DC | PRN
  Administered 2014-04-05: 35 mL via EPIDURAL

## 2014-04-05 MED ORDER — MIDAZOLAM HCL 2 MG/2ML IJ SOLN: 1.0000 mg | INTRAMUSCULAR | Status: DC | PRN

## 2014-04-05 MED ORDER — DEXAMETHASONE SODIUM PHOSPHATE 10 MG/ML IJ SOLN
INTRAMUSCULAR | Status: DC | PRN
  Administered 2014-04-05: 10 mg via INTRAVENOUS

## 2014-04-05 MED ORDER — LIDOCAINE HCL (CARDIAC) 20 MG/ML IV SOLN
INTRAVENOUS | Status: DC | PRN
  Administered 2014-04-05: 50 mg via INTRAVENOUS

## 2014-04-05 MED ORDER — FENTANYL CITRATE 0.05 MG/ML IJ SOLN
INTRAMUSCULAR | Status: AC
  Filled 2014-04-05: qty 2

## 2014-04-05 MED ORDER — FENTANYL CITRATE 0.05 MG/ML IJ SOLN
INTRAMUSCULAR | Status: AC
  Filled 2014-04-05: qty 4

## 2014-04-05 MED ORDER — SILVER SULFADIAZINE 1 % EX CREA
TOPICAL_CREAM | CUTANEOUS | Status: DC | PRN
  Administered 2014-04-05: 1 via TOPICAL

## 2014-04-05 MED ORDER — PHENYLEPHRINE HCL 10 MG/ML IJ SOLN
10.0000 mg | INTRAVENOUS | Status: DC | PRN
  Administered 2014-04-05: 50 ug/min via INTRAVENOUS
  Administered 2014-04-05: 50 ug via INTRAVENOUS

## 2014-04-05 MED ORDER — LACTATED RINGERS IV SOLN
INTRAVENOUS | Status: DC
  Administered 2014-04-05 (×2): via INTRAVENOUS

## 2014-04-05 SURGICAL SUPPLY — 60 items
BANDAGE ADH SHEER 1  50/CT (GAUZE/BANDAGES/DRESSINGS) IMPLANT
BANDAGE ELASTIC 3 VELCRO ST LF (GAUZE/BANDAGES/DRESSINGS) IMPLANT
BLADE MINI RND TIP GREEN BEAV (BLADE) ×2 IMPLANT
BLADE SURG 15 STRL LF DISP TIS (BLADE) ×1 IMPLANT
BLADE SURG 15 STRL SS (BLADE) ×8
BNDG CMPR 9X4 STRL LF SNTH (GAUZE/BANDAGES/DRESSINGS) ×1
BNDG COHESIVE 3X5 TAN STRL LF (GAUZE/BANDAGES/DRESSINGS) ×1 IMPLANT
BNDG ESMARK 4X9 LF (GAUZE/BANDAGES/DRESSINGS) ×2 IMPLANT
BNDG GAUZE ELAST 4 BULKY (GAUZE/BANDAGES/DRESSINGS) ×3 IMPLANT
BRUSH SCRUB EZ PLAIN DRY (MISCELLANEOUS) ×2 IMPLANT
CORDS BIPOLAR (ELECTRODE) ×2 IMPLANT
COVER MAYO STAND STRL (DRAPES) ×2 IMPLANT
COVER TABLE BACK 60X90 (DRAPES) ×2 IMPLANT
CUFF TOURNIQUET SINGLE 18IN (TOURNIQUET CUFF) ×1 IMPLANT
DECANTER SPIKE VIAL GLASS SM (MISCELLANEOUS) IMPLANT
DRAPE EXTREMITY T 121X128X90 (DRAPE) ×2 IMPLANT
DRAPE SURG 17X23 STRL (DRAPES) ×2 IMPLANT
DRSG EMULSION OIL 3X3 NADH (GAUZE/BANDAGES/DRESSINGS) ×2 IMPLANT
GAUZE SPONGE 4X4 12PLY STRL (GAUZE/BANDAGES/DRESSINGS) ×2 IMPLANT
GLOVE BIO SURGEON STRL SZ7.5 (GLOVE) ×2 IMPLANT
GLOVE BIOGEL M STRL SZ7.5 (GLOVE) ×1 IMPLANT
GLOVE BIOGEL PI IND STRL 7.0 (GLOVE) IMPLANT
GLOVE BIOGEL PI IND STRL 7.5 (GLOVE) IMPLANT
GLOVE BIOGEL PI IND STRL 8 (GLOVE) IMPLANT
GLOVE BIOGEL PI INDICATOR 7.0 (GLOVE) ×2
GLOVE BIOGEL PI INDICATOR 7.5 (GLOVE) ×1
GLOVE BIOGEL PI INDICATOR 8 (GLOVE) ×2
GLOVE ECLIPSE 6.5 STRL STRAW (GLOVE) ×2 IMPLANT
GLOVE ORTHO TXT STRL SZ7.5 (GLOVE) ×2 IMPLANT
GOWN STRL REUS W/ TWL LRG LVL3 (GOWN DISPOSABLE) ×1 IMPLANT
GOWN STRL REUS W/ TWL XL LVL3 (GOWN DISPOSABLE) ×2 IMPLANT
GOWN STRL REUS W/TWL LRG LVL3 (GOWN DISPOSABLE) ×4
GOWN STRL REUS W/TWL XL LVL3 (GOWN DISPOSABLE) ×5 IMPLANT
LOOP VESSEL MAXI BLUE (MISCELLANEOUS) IMPLANT
NDL HYPO 25X1 1.5 SAFETY (NEEDLE) IMPLANT
NEEDLE 27GAX1X1/2 (NEEDLE) ×1 IMPLANT
NEEDLE HYPO 25X1 1.5 SAFETY (NEEDLE) IMPLANT
NS IRRIG 1000ML POUR BTL (IV SOLUTION) ×2 IMPLANT
PACK BASIN DAY SURGERY FS (CUSTOM PROCEDURE TRAY) ×2 IMPLANT
PAD CAST 3X4 CTTN HI CHSV (CAST SUPPLIES) ×1 IMPLANT
PADDING CAST ABS 4INX4YD NS (CAST SUPPLIES)
PADDING CAST ABS COTTON 4X4 ST (CAST SUPPLIES) IMPLANT
PADDING CAST COTTON 3X4 STRL (CAST SUPPLIES) ×2
SLEEVE SCD COMPRESS KNEE MED (MISCELLANEOUS) ×2 IMPLANT
SLING ARM LRG ADULT FOAM STRAP (SOFTGOODS) ×1 IMPLANT
SPLINT PLASTER CAST XFAST 3X15 (CAST SUPPLIES) ×8 IMPLANT
SPLINT PLASTER XTRA FASTSET 3X (CAST SUPPLIES) ×10
STOCKINETTE 4X48 STRL (DRAPES) ×2 IMPLANT
STRIP CLOSURE SKIN 1/2X4 (GAUZE/BANDAGES/DRESSINGS) IMPLANT
SUT ETHILON 5 0 P 3 18 (SUTURE) ×4
SUT NYLON ETHILON 5-0 P-3 1X18 (SUTURE) ×2 IMPLANT
SUT SILK 4 0 PS 2 (SUTURE) ×3 IMPLANT
SUT VIC AB 4-0 P2 18 (SUTURE) IMPLANT
SYR 3ML 23GX1 SAFETY (SYRINGE) IMPLANT
SYR BULB 3OZ (MISCELLANEOUS) ×2 IMPLANT
SYR CONTROL 10ML LL (SYRINGE) ×1 IMPLANT
TOWEL OR 17X24 6PK STRL BLUE (TOWEL DISPOSABLE) ×4 IMPLANT
TOWEL OR NON WOVEN STRL DISP B (DISPOSABLE) ×1 IMPLANT
TRAY DSU PREP LF (CUSTOM PROCEDURE TRAY) ×2 IMPLANT
UNDERPAD 30X30 INCONTINENT (UNDERPADS AND DIAPERS) ×2 IMPLANT

## 2014-04-05 NOTE — Progress Notes (Signed)
Assisted Dr. Ola Spurr with left, ultrasound guided, supraclavicular block. Side rails up, monitors on throughout procedure. See vital signs in flow sheet. Tolerated Procedure well.

## 2014-04-05 NOTE — Op Note (Signed)
[id number] 

## 2014-04-05 NOTE — Anesthesia Procedure Notes (Addendum)
Anesthesia Regional Block:  Supraclavicular block  Pre-Anesthetic Checklist: ,, timeout performed, Correct Patient, Correct Site, Correct Laterality, Correct Procedure, Correct Position, site marked, Risks and benefits discussed, pre-op evaluation, post-op pain management  Laterality: Left  Prep: Maximum Sterile Barrier Precautions used and chloraprep       Needles:  Injection technique: Single-shot  Needle Type: Echogenic Stimulator Needle     Needle Length: 5cm 5 cm Needle Gauge: 22 and 22 G    Additional Needles:  Procedures: ultrasound guided (picture in chart) Supraclavicular block Narrative:  Start time: 04/05/2014 9:33 AM End time: 04/05/2014 9:44 AM Injection made incrementally with aspirations every 5 mL. Anesthesiologist: Fitzgerald,MD  Additional Notes: 2% Lidocaine skin wheel. Intercostobrachial block with 5cc of 0.5% Ropivicaine.   Procedure Name: LMA Insertion Date/Time: 04/05/2014 11:22 AM Performed by: Lieutenant Diego Pre-anesthesia Checklist: Patient identified, Emergency Drugs available, Suction available and Patient being monitored Patient Re-evaluated:Patient Re-evaluated prior to inductionOxygen Delivery Method: Circle System Utilized Preoxygenation: Pre-oxygenation with 100% oxygen Intubation Type: IV induction Ventilation: Mask ventilation without difficulty LMA: LMA inserted LMA Size: 4.0 Number of attempts: 1 Airway Equipment and Method: bite block Placement Confirmation: positive ETCO2 and breath sounds checked- equal and bilateral Tube secured with: Tape Dental Injury: Teeth and Oropharynx as per pre-operative assessment

## 2014-04-05 NOTE — Anesthesia Postprocedure Evaluation (Signed)
  Anesthesia Post-op Note  Patient: Jonathan [name]  Procedure(s) Performed: Procedure(s): EXCISION DUPUYTREN LEFT PALM, RING, SMALL, AND THUMB (Left)  Patient Location: PACU  Anesthesia Type:General and block  Level of Consciousness: awake and alert   Airway and Oxygen Therapy: Patient Spontanous Breathing  Post-op Pain: none  Post-op Assessment: Post-op Vital signs reviewed, Patient's Cardiovascular Status Stable and Respiratory Function Stable  Post-op Vital Signs: Reviewed  Filed Vitals:   04/05/14 1430  BP: 163/72  Pulse: 76  Temp:   Resp: 16    Complications: No apparent anesthesia complications

## 2014-04-05 NOTE — Discharge Instructions (Addendum)
Keep left hand elevated for the next 5 days above heart level. You may gently move the fingers and the bandage in flexion and extension. Use the pain medication provided one or 2 tablets every 4-6 hours as needed for postsurgical pain.  Contact our office for followup appointment in 8 days for dressing change.  Call our office if you have any difficulties with fever increasing pain drainage from the dressing or unexpected circumstances.  Regional Anesthesia Blocks  1. Numbness or the inability to move the "blocked" extremity may last from 3-48 hours after placement. The length of time depends on the medication injected and your individual response to the medication. If the numbness is not going away after 48 hours, call your surgeon.  2. The extremity that is blocked will need to be protected until the numbness is gone and the  Strength has returned. Because you cannot feel it, you will need to take extra care to avoid injury. Because it may be weak, you may have difficulty moving it or using it. You may not know what position it is in without looking at it while the block is in effect.  3. For blocks in the legs and feet, returning to weight bearing and walking needs to be done carefully. You will need to wait until the numbness is entirely gone and the strength has returned. You should be able to move your leg and foot normally before you try and bear weight or walk. You will need someone to be with you when you first try to ensure you do not fall and possibly risk injury.  4. Bruising and tenderness at the needle site are common side effects and will resolve in a few days.  5. Persistent numbness or new problems with movement should be communicated to the surgeon or the [place] [id number] [place] [phone]).   Post Anesthesia Home Care Instructions  Activity: Get plenty of rest for the remainder of the day. A responsible adult should stay with you  for 24 hours following the procedure.  For the next 24 hours, DO NOT: -Drive a car -Paediatric nurse -Drink alcoholic beverages -Take any medication unless instructed by your physician -Make any legal decisions or sign important papers.  Meals: Start with liquid foods such as gelatin or soup. Progress to regular foods as tolerated. Avoid greasy, spicy, heavy foods. If nausea and/or vomiting occur, drink only clear liquids until the nausea and/or vomiting subsides. Call your physician if vomiting continues.  Special Instructions/Symptoms: Your throat may feel dry or sore from the anesthesia or the breathing tube placed in your throat during surgery. If this causes discomfort, gargle with warm salt water. The discomfort should disappear within 24 hours.

## 2014-04-05 NOTE — H&P (Signed)
Jonathan [name] is an 80 y.o. male.   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 HPI: 80 year old well known patient with progressive Dupuytren's contracture of left hand.  He returns requesting motion salvaging surgery at this time. Background arthritis noted.  Past Medical History  Diagnosis Date  . Hypertension   . Hyperlipidemia   . Degeneration of lumbar or lumbosacral intervertebral disc   . BPH (benign prostatic hypertrophy)   . Allergic rhinitis   . Dyslipidemia   . Right leg weakness   . Coronary atherosclerosis of native coronary artery   . CAD (coronary artery disease)     CABG 2006   . HOH (hard of hearing)   . Wears glasses     reading  . Diabetes     no meds    Past Surgical History  Procedure Laterality Date  . Rotator cuff repair    . Laminectomy    . Bil carpal tunnel surgery    . Hemorrhoid surgery    . Transurethral resection of prostate    . Tonsillectomy    . Colonoscopy    . Eye surgery      both cataracts    Family History  Problem Relation Age of Onset  . Hypertension Father    Social History:  reports that he has never smoked. He does not have any smokeless tobacco history on file. He reports that he does not drink alcohol or use illicit drugs.  Allergies:  Allergies  Allergen Reactions  . Codeine     No prescriptions prior to admission    No results found for this or any previous visit (from the past 48 hour(s)). No results found.  Review of Systems  Constitutional: Negative.   HENT: Negative.   Eyes: Negative.   Respiratory: Negative.   Cardiovascular: Negative.   Gastrointestinal: Negative.   Genitourinary: Negative.   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  Skin: Negative.   Endo/Heme/Allergies: Negative.   Psychiatric/Behavioral: Negative.     Height 5\' 10"  (1.778 m), weight  79.379 kg (175 lb). Physical Exam  Constitutional: He is oriented to person, place, and time. He appears well-developed and well-nourished.  HENT:  Head: Normocephalic and atraumatic.  Eyes: Conjunctivae and EOM are normal. Pupils are equal, round, and reactive to light.  Neck: Normal range of motion. Neck supple.  Cardiovascular: Normal rate, regular rhythm and normal heart sounds.   Respiratory: Effort normal and breath sounds normal.  GI: Soft. Bowel sounds are normal.  Musculoskeletal:  Dupuytren's contracture of thumb web, ring and small fingers.  Neurological: He is alert and oriented to person, place, and time. He has normal reflexes.  Skin: Skin is warm and dry.  Psychiatric: He has a normal mood and affect. His behavior is normal. Judgment and thought content normal.     Assessment/Plan Progressive dupuytren's contracture.  Salvage surgery to relieve flexion contractures left hand.  Complesx biology of Dupuytren's surgery detailed.  Risks and benefits outlined. Questions invited and answered.  Phineas Mcenroe JR,Janos Shampine V 04/05/2014, 6:34 AM

## 2014-04-05 NOTE — Transfer of Care (Signed)
Immediate Anesthesia Transfer of Care Note  Patient: Jonathan [name]  Procedure(s) Performed: Procedure(s): EXCISION DUPUYTREN LEFT PALM, RING, SMALL, AND THUMB (Left)  Patient Location: PACU  Anesthesia Type:GA combined with regional for post-op pain  Level of Consciousness: sedated and patient cooperative  Airway & Oxygen Therapy: Patient Spontanous Breathing and Patient connected to face mask oxygen  Post-op Assessment: Report given to PACU RN and Post -op Vital signs reviewed and stable  Post vital signs: Reviewed and stable  Complications: No apparent anesthesia complications

## 2014-04-05 NOTE — Anesthesia Preprocedure Evaluation (Addendum)
Anesthesia Evaluation  Patient identified by MRN, date of birth, ID band Patient awake    Reviewed: Allergy & Precautions, H&P , NPO status , Patient's Chart, lab work & pertinent test results  Airway Mallampati: II TM Distance: >3 FB Neck ROM: Full    Dental no notable dental hx. (+) Edentulous Upper, Partial Lower, Dental Advisory Given   Pulmonary neg pulmonary ROS,  breath sounds clear to auscultation  Pulmonary exam normal       Cardiovascular hypertension, On Medications + CAD Rhythm:Regular Rate:Normal     Neuro/Psych negative neurological ROS  negative psych ROS   GI/Hepatic negative GI ROS, Neg liver ROS,   Endo/Other  diabetes  Renal/GU negative Renal ROS  negative genitourinary   Musculoskeletal   Abdominal   Peds  Hematology negative hematology ROS (+)   Anesthesia Other Findings   Reproductive/Obstetrics negative OB ROS                          Anesthesia Physical Anesthesia Plan  ASA: III  Anesthesia Plan: General and Regional   Post-op Pain Management:    Induction: Intravenous  Airway Management Planned: LMA  Additional Equipment:   Intra-op Plan:   Post-operative Plan: Extubation in OR  Informed Consent: I have reviewed the patients History and Physical, chart, labs and discussed the procedure including the risks, benefits and alternatives for the proposed anesthesia with the patient or authorized representative who has indicated his/her understanding and acceptance.   Dental advisory given  Plan Discussed with: CRNA  Anesthesia Plan Comments:         Anesthesia Quick Evaluation

## 2014-04-05 NOTE — Brief Op Note (Signed)
04/05/2014  2:00 PM  PATIENT:  Jonathan [name]  80 y.o. male  PRE-OPERATIVE DIAGNOSIS:  LEFT HAND DUPUYTREN'S CONTRACTURE  POST-OPERATIVE DIAGNOSIS:  LEFT HAND DUPUYTREN'S CONTRACTURE  PROCEDURE:  RESECTION OF DUPUYTREN'S PALM, SMALL, RING,  LONG, INDEX AND THUMB  SURGEON:  Surgeon(s) and Role:    * Cammie Sickle, MD - Primary    * Tennis Must, MD - Assisting  PHYSICIAN ASSISTANT:   ASSISTANTS:   ANESTHESIA:   general  EBL:  Total I/O In: 1300 [I.V.:1300] Out: -   BLOOD ADMINISTERED:none  DRAINS: none   LOCAL MEDICATIONS USED:  ROPIVACAINE PLEXUS BLOCK  SPECIMEN:  Biopsy / Limited Resection  DISPOSITION OF SPECIMEN:  PATHOLOGY  COUNTS:  YES  TOURNIQUET:   Total Tourniquet Time Documented: Upper Arm (Left) - 117 minutes Total: Upper Arm (Left) - 117 minutes   DICTATION: .Other Dictation: Dictation Number [id number]  PLAN OF CARE: Discharge to home after PACU  PATIENT DISPOSITION:  PACU - hemodynamically stable.   Delay start of Pharmacological VTE agent (>24hrs) due to surgical blood loss or risk of bleeding: not applicable

## 2014-04-06 ENCOUNTER — Encounter (HOSPITAL_BASED_OUTPATIENT_CLINIC_OR_DEPARTMENT_OTHER): Payer: Self-pay | Admitting: Orthopedic Surgery

## 2014-04-06 NOTE — Op Note (Signed)
NAME:  Jonathan [name], Jonathan [name] NO.:  [id number]  MEDICAL RECORD NO.:  [id number]  LOCATION:                                 FACILITY:  PHYSICIAN:  Jonathan Mighty. Sharon Rubis, M.D.      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  OPERATION: 1. Fasciectomy of pretendinous fibers and extensive nodular disease,     small finger with double Z-plasty. 2. Resection of palmar fascia including pretendinous fibers in palm     and nodular disease over proximal / middle phalanges, ring finger, 3. Resection of very complex radial and palmar disease from thumb and     resection of 2 thumb index web space cords. 4. Resection of pretendinous fibers to index finger. 5. Resection of pretendinous fibers to long finger with segmental     fasciotomy.  OPERATING SURGEON:  Jonathan Mighty. Roberta Kelly, MD.  ASSISTANT:  Jonathan Cover, MD  ANESTHESIA:  General by LMA supplemented by ropivacaine left plexus block placed by Jonathan [name] in the holding area.  INDICATIONS:  Jonathan [name] is an 80 year old, right-hand dominant gentleman who is a longstanding patient for more than 25 years.  More than 80 years age, we resected a complex Dupuytren's palmar fibromatosis from his right hand.  He has had longstanding satisfactory relief of his disease in the palm and small finger.  He now has early disease in the ring finger on the right.  He returned for shoulder reconstruction thereafter and now returns for very complex Dupuytren palmar fibromatosis involvement of his left hand.  He has extensive disease in the thumb and thumb index web space.  He had a 45 degrees flexion contracture of his thumb MP joint and a large nodular radial cord extending to the IP joint.  He had two very thick natatory ligament nodular cords across the web space involving the pretendinous fibers to the index finger as well as the extensive disease of the thumb.  He had involvement of the pretendinous fibers to the index and long fingers as well as a very extensive disease in the ring and small fingers with very prominent flexion contractures of the interphalangeal joints of the  ring and small fingers.  There was a very large cord and nodule along the ulnar aspect of the small finger, that was an extension of the abductor digiti minimi.  We had a detailed informed consent with Jonathan [name] who requested that we perform a salvage procedure to relieve his flexion contractures and allow his thumb and thumb index web space to operate more normally.  At age 80, he is relatively healthy.  He is followed by Jonathan [name] and has occasional dyspnea on exertion.  He uses nitroglycerin occasionally.  His current medications include losartan, hydrochlorothiazide, and aspirin.  He is allergic to codeine.  After detailed informed consent in the  office during which we explained to him that we cannot change the natural history of Dupuytren's disease, however, we could relieve his flexion contractures by fasciotomy and fasciectomy.  Having had a prior experience with right hand surgery, he was ready to proceed at this time.  Preoperatively, we reminded him of the potential risks of surgery which include infection, neurovascular injury, failure to relieve his flexion contractures, keloid scar formation, CRPS type 1 or type 2.  After details have been answered in the office, he was brought to the operating room at this time.  Preoperatively, he was interviewed by Jonathan [name] of Anesthesia and had a ropivacaine plexus block for perioperative comfort and sympathectomy.  DESCRIPTION OF PROCEDURE:  Jonathan [name] was transferred to room 1 of the [facility] and placed supine position on the operating table.  Following induction of general anesthesia by LMA technique under Dr. Blane Ohara direct supervision, the left hand and arm were prepped with Betadine soap and solution, sterilely draped.  A pneumatic tourniquet was applied to the proximal left brachium.  Following exsanguination of the left arm with Esmarch bandage and confirmation that 2 g of Ancef had been administered as an IV prophylactic antibiotic, the arterial tourniquet was inflated to 220 mmHg.  A routine surgical time-out was accomplished.  We carefully studied the complex fascia nodular disease.  In the small finger, we elected to perform a midline incision to allow exposure of the complex multiple cords with optimum visualization of the neurovascular bundles.  This was extended from the distal aspect of the middle phalanx to the hypothenar eminence.  In the ring finger. we performed a Brunner zigzag incision that could have been extended as V-Y advancement flaps and in the thumb-index webspace, we decided to have a pair of incisions.  On the  radial aspect of the thumb, we performed an extensile Brunner incision and across the thumb index web space, a transverse incision to facilitate safe exposure of the cords.  Sequentially, the skin incisions were taken sharply followed by meticulous elevation the dermis off the pathologic fascia. The pretendinous fibers to the long, ring, and small fingers were's resected in the palm and index fingers treated with segmental fasciotomy. Extensive dissection of the neurovascular structures for the common digital vessels to the long, ring, and small fingers as well as the proper digital vessels to the ring and small fingers were meticulously dissected taking care to preserve all named vessels. Several perforating vessels and cutaneous nerve branches were sacrificed with the extensive nodular disease.  We released the cord to the abductor digiti minimi and resected a very large 1 cm x 8 mm nodule on the ulnar aspect of the small finger, proximal phalangeal segment.  In the ring finger, we performed a Brunner step cut across the proximal finger flexion crease and excised the  natatory ligaments between the ring and small fingers.  We removed the extensive nodular disease of the ring finger, and ultimately relieved the flexion contracture of the metacarpophalangeal and proximal interphalangeal joints.  We then proceeded to perform a very extensive dissection of the thumb including resection of 2 cords across the thumb index web space and a very extensive central and radial cord/nodule complex that required meticulous neurolysis of the radial and ulnar proper digital nerves, radial and ulnar proper digital arteries and careful protection of the princeps pollicis artery.  We then performed a transverse incision across the 1st web and resected the pretendinous fibers as well the natatory ligaments at the 1st web.  The index pretendinous fibers were resected by segmental fasciotomy.  We ultimately  corrected all flexion contractures and removed at least 95% of the nodular disease.  The tourniquet was released at 1 hour and 17 minutes with direct pressure to obtain hemostasis.  Bipolar cautery under saline allowed further hemostasis followed by pressure on the wound for 10 minutes.  We then loosely closed the wounds with a pair of 45 degree Z-plasties across the proximal finger flexion crease and in the middle palmar crease of the small finger and repair of the Brunner incisions for the thumb and ring finger.  The transverse incision across the webspace was repaired with simple interrupted suture.  Ultimately the wounds were dressed with Adaptic, Silvadene, sterile gauze, 2 Kerlix for fluffed compression followed by sterile Webril, and a volar plaster splint maintaining the MP and IP joints of the ring and small fingers in near full extension.  We had immediate capillary refill to all fingers and the thumb within 10 seconds of tourniquet release.  There were no apparent complications.  For aftercare, Jonathan [name] is advised to elevate his hand for the next 4 days.  He is provided a prescription for Dilaudid 2 mg 1 or 2 tablets p.o. q.4-6 hours p.r.n. pain, 30 tablets without refill, also Keflex 500 mg 1 p.o. q.8 hours x4 days as prophylactic antibiotic.     Jonathan [name], M.D.     RVS/MEDQ  D:  04/05/2014  T:  04/06/2014  Job:  [id number]  cc:   Dr. Irish Lack

## 2015-08-07 ENCOUNTER — Encounter (HOSPITAL_COMMUNITY): Payer: Self-pay | Admitting: Emergency Medicine

## 2015-08-07 ENCOUNTER — Emergency Department (HOSPITAL_COMMUNITY): Payer: Medicare Other

## 2015-08-07 ENCOUNTER — Emergency Department (HOSPITAL_COMMUNITY)
Admission: EM | Admit: 2015-08-07 | Discharge: 2015-08-08 | Disposition: A | Discharge status: Home / Self Care | Payer: Medicare Other | Attending: Physician Assistant | Admitting: Physician Assistant

## 2015-08-07 DIAGNOSIS — R42 Dizziness and giddiness: Secondary | ICD-10-CM | POA: Insufficient documentation

## 2015-08-07 DIAGNOSIS — Z951 Presence of aortocoronary bypass graft: Secondary | ICD-10-CM | POA: Diagnosis not present

## 2015-08-07 DIAGNOSIS — Z79899 Other long term (current) drug therapy: Secondary | ICD-10-CM | POA: Diagnosis not present

## 2015-08-07 DIAGNOSIS — H9190 Unspecified hearing loss, unspecified ear: Secondary | ICD-10-CM | POA: Insufficient documentation

## 2015-08-07 DIAGNOSIS — Z87438 Personal history of other diseases of male genital organs: Secondary | ICD-10-CM | POA: Diagnosis not present

## 2015-08-07 DIAGNOSIS — Z8739 Personal history of other diseases of the musculoskeletal system and connective tissue: Secondary | ICD-10-CM | POA: Insufficient documentation

## 2015-08-07 DIAGNOSIS — Z8709 Personal history of other diseases of the respiratory system: Secondary | ICD-10-CM | POA: Diagnosis not present

## 2015-08-07 DIAGNOSIS — I10 Essential (primary) hypertension: Secondary | ICD-10-CM | POA: Diagnosis present

## 2015-08-07 DIAGNOSIS — E119 Type 2 diabetes mellitus without complications: Secondary | ICD-10-CM | POA: Diagnosis not present

## 2015-08-07 DIAGNOSIS — I2510 Atherosclerotic heart disease of native coronary artery without angina pectoris: Secondary | ICD-10-CM | POA: Diagnosis not present

## 2015-08-07 DIAGNOSIS — I159 Secondary hypertension, unspecified: Secondary | ICD-10-CM | POA: Diagnosis not present

## 2015-08-07 LAB — BASIC METABOLIC PANEL
Anion gap: 8 (ref 5–15)
BUN: 16 mg/dL (ref 6–20)
CHLORIDE: 100 mmol/L — AB (ref 101–111)
CO2: 27 mmol/L (ref 22–32)
Calcium: 8.9 mg/dL (ref 8.9–10.3)
Creatinine, Ser: 0.83 mg/dL (ref 0.61–1.24)
GFR calc Af Amer: >60 mL/min (ref >60)
GFR calc non Af Amer: >60 mL/min (ref >60)
GLUCOSE: 170 mg/dL — AB (ref 65–99)
POTASSIUM: 3.9 mmol/L (ref 3.5–5.1)
Sodium: 135 mmol/L (ref 135–145)

## 2015-08-07 LAB — I-STAT CHEM 8, ED
BUN: 18 mg/dL (ref 6–20)
CHLORIDE: 103 mmol/L (ref 101–111)
CREATININE: 0.8 mg/dL (ref 0.61–1.24)
Calcium, Ion: 1.12 mmol/L — ABNORMAL LOW (ref 1.13–1.30)
GLUCOSE: 200 mg/dL — AB (ref 65–99)
HEMATOCRIT: 40 % (ref 39.0–52.0)
HEMOGLOBIN: 13.6 g/dL (ref 13.0–17.0)
Potassium: 3.7 mmol/L (ref 3.5–5.1)
Sodium: 141 mmol/L (ref 135–145)
TCO2: 24 mmol/L (ref 0–100)

## 2015-08-07 LAB — CBC WITH DIFFERENTIAL/PLATELET
BASOS ABS: 0 10*3/uL (ref 0.0–0.1)
Basophils Relative: 0 %
EOS PCT: 2 %
Eosinophils Absolute: 0.1 10*3/uL (ref 0.0–0.7)
HEMATOCRIT: 41.1 % (ref 39.0–52.0)
Hemoglobin: 13.7 g/dL (ref 13.0–17.0)
LYMPHS ABS: 1.5 10*3/uL (ref 0.7–4.0)
LYMPHS PCT: 17 %
MCH: 27.2 pg (ref 26.0–34.0)
MCHC: 33.3 g/dL (ref 30.0–36.0)
MCV: 81.7 fL (ref 78.0–100.0)
MONO ABS: 0.7 10*3/uL (ref 0.1–1.0)
MONOS PCT: 7 %
NEUTROS ABS: 6.7 10*3/uL (ref 1.7–7.7)
Neutrophils Relative %: 74 %
PLATELETS: 232 10*3/uL (ref 150–400)
RBC: 5.03 MIL/uL (ref 4.22–5.81)
RDW: 13.7 % (ref 11.5–15.5)
WBC: 9.1 10*3/uL (ref 4.0–10.5)

## 2015-08-07 NOTE — ED Provider Notes (Signed)
CSN: [id number]     Arrival date & time 08/07/15  2117 History   First MD Initiated Contact with Patient 08/07/15 2132     Chief Complaint  Patient presents with  . Hypertension     (Consider location/radiation/quality/duration/timing/severity/associated sxs/prior Treatment) HPI Comments: This is an 81 year old man 10 years post CABG he states he's been on Hyzaar 100/12.5 for the past 2 years.  He also reports for the past 2 years that he's had intermittent episodes of dizziness with head movement.  He takes his blood pressure 4-5 times a day and has noticed that the last 24 hours.  His blood pressure has been persistently elevated. He has played with timing of his blood pressure medication.  He states he did not take any of his medication yesterday but took his dose tonight at 8:00 just prior to leaving for the emergency department. He has not discussed his persistent dizziness with his cardiologist or his primary care physician. He describes the dizziness as when he looks up or turns his head side to side.  He gets dizzy it to the point where sometimes he will have near syncopal episodes even while sitting.  He has caught himself on numerous occasions prior to hitting the floor. He states he has regular eye he had cataract surgery 3-5 years ago.  He wears readers but this has not changed.  Denies any recent illnesses.  No chronic sinus congestion.  No ear problems.  No cerumen problems.  Patient is a 81 y.o. male presenting with hypertension. The history is provided by the patient.  Hypertension This is a chronic problem. The current episode started more than 1 year ago. The problem occurs intermittently. The problem has been unchanged. Pertinent negatives include no abdominal pain, anorexia, chest pain, chills, congestion, fever, headaches, nausea, neck pain, sore throat or weakness. Associated symptoms comments: dizzy. Exacerbated by: position. He has tried nothing for the symptoms. The  treatment provided no relief.    Past Medical History  Diagnosis Date  . Hypertension   . Hyperlipidemia   . Degeneration of lumbar or lumbosacral intervertebral disc   . BPH (benign prostatic hypertrophy)   . Allergic rhinitis   . Dyslipidemia   . Right leg weakness   . Coronary atherosclerosis of native coronary artery   . CAD (coronary artery disease)     CABG 2006   . HOH (hard of hearing)   . Wears glasses     reading  . Diabetes ([place])     no meds   Past Surgical History  Procedure Laterality Date  . Rotator cuff repair    . Laminectomy    . Bil carpal tunnel surgery    . Hemorrhoid surgery    . Transurethral resection of prostate    . Tonsillectomy    . Colonoscopy    . Eye surgery      both cataracts  . Dupuytren contracture release Left 04/05/2014    Procedure: EXCISION DUPUYTREN LEFT PALM, RING, SMALL, AND THUMB;  Surgeon: Cammie Sickle, MD;  Location: [facility];  Service: Orthopedics;  Laterality: Left;   Family History  Problem Relation Age of Onset  . Hypertension Father    Social History  Substance Use Topics  . Smoking status: Never Smoker   . Smokeless tobacco: None  . Alcohol Use: No    Review of Systems  Constitutional: Negative for fever and chills.  HENT: Negative for congestion, sinus pressure and sore throat.   Eyes:  Negative for visual disturbance.  Respiratory: Negative for chest tightness.   Cardiovascular: Negative for chest pain, palpitations and leg swelling.  Gastrointestinal: Negative for nausea, abdominal pain and anorexia.  Musculoskeletal: Negative for neck pain.  Neurological: Positive for dizziness. Negative for weakness and headaches.  All other systems reviewed and are negative.     Allergies  Codeine and Statins  Home Medications   Prior to Admission medications   Medication Sig Start Date End Date Taking? Authorizing Provider  losartan (COZAAR) 100 MG tablet Take 50 mg by mouth daily.   Yes  Historical Provider, MD  cephALEXin (KEFLEX) 500 MG capsule Take 1 capsule (500 mg total) by mouth 3 (three) times daily. Patient not taking: Reported on 08/07/2015 04/05/14   Theodis Sato, MD  HYDROmorphone (DILAUDID) 2 MG tablet Take 1 tablet (2 mg total) by mouth every 4 (four) hours as needed for moderate pain or severe pain. Patient not taking: Reported on 08/07/2015 04/05/14   Theodis Sato, MD   BP 140/80 mmHg  Pulse 83  Temp(Src) 98 F (36.7 C) (Oral)  Resp 20  SpO2 93% Physical Exam  Constitutional: He appears well-developed and well-nourished. No distress.  HENT:  Head: Normocephalic and atraumatic.  Right Ear: External ear normal.  Left Ear: External ear normal.  Mouth/Throat: Oropharynx is clear and moist.  Eyes: Pupils are equal, round, and reactive to light.  Neck: Normal range of motion. Normal carotid pulses and no JVD present. No spinous process tenderness and no muscular tenderness present.  Cardiovascular: Normal rate and regular rhythm.   Pulmonary/Chest: Effort normal and breath sounds normal.  Abdominal: Soft. Bowel sounds are normal. He exhibits no distension.  Musculoskeletal: He exhibits no edema or tenderness.  Skin: Skin is warm and dry. No pallor.  Nursing note and vitals reviewed.   ED Course  Procedures (including critical care time) Labs Review Labs Reviewed  BASIC METABOLIC PANEL - Abnormal; Notable for the following:    Chloride 100 (*)    Glucose, Bld 170 (*)    All other components within normal limits  I-STAT CHEM 8, ED - Abnormal; Notable for the following:    Glucose, Bld 200 (*)    Calcium, Ion 1.12 (*)    All other components within normal limits  CBC WITH DIFFERENTIAL/PLATELET  URINALYSIS, ROUTINE W REFLEX MICROSCOPIC (NOT AT [facility])    Imaging Review Ct Head Wo Contrast  08/08/2015  CLINICAL DATA:  81 year old male with 2 year history of dizziness. Elevated blood pressure today. EXAM: CT HEAD WITHOUT CONTRAST TECHNIQUE: Contiguous  axial images were obtained from the base of the skull through the vertex without intravenous contrast. COMPARISON:  No priors. FINDINGS: Patchy and confluent areas of decreased attenuation are noted throughout the deep and periventricular white matter of the cerebral hemispheres bilaterally, compatible with chronic microvascular ischemic disease. Well-defined areas of low attenuation in the left thalamus, compatible with old lacunar infarcts. No acute intracranial abnormalities. Specifically, no evidence of acute intracranial hemorrhage, no definite findings of acute/subacute cerebral ischemia, no mass, mass effect, hydrocephalus or abnormal intra or extra-axial fluid collections. Visualized paranasal sinuses and mastoids are well pneumatized. No acute displaced skull fractures are identified. IMPRESSION: 1. No acute intracranial abnormalities. 2. 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 Electronically Signed   By: Vinnie Langton M.D.   On: 08/08/2015 00:20   I have personally reviewed and evaluated these images and lab results as part of my medical decision-making.   EKG  Interpretation   Date/Time:  Wednesday August 07 2015 22:00:41 EDT Ventricular Rate:  84 PR Interval:  166 QRS Duration: 105 QT Interval:  381 QTC Calculation: 450 R Axis:   28 Text Interpretation:  Sinus rhythm Probable left atrial enlargement  noacute ischemia. No significant change since last tracing Confirmed by  Gerald Leitz ([id number]) on 08/07/2015 11:34:07 PM     Will obtain basic labs.  A head CT.  I feel this patient needs a carotid ultrasound and tilt table examination to better ascertain the exact cause for his chronic dizziness Initial examination prior to leaving the room.  I discussed obtaining labs and screening head CT, but it was also recommended that the patient follow-up with his primary care physician or cardiologist for carotid Dopplers as well as a tilt  table test to help explain his chronic dizziness at this time, I do not feel inclined to change his blood pressure medicine. MDM   Final diagnoses:  Dizzy spells  Secondary hypertension, unspecified         Junius Creamer, NP 08/08/15 0026  Courteney Julio Alm, MD 08/08/15 2259  Courteney Julio Alm, MD 08/08/15 2259

## 2015-08-07 NOTE — ED Notes (Signed)
Gail Schultz, NP at bedside at this time.  

## 2015-08-07 NOTE — ED Notes (Signed)
Phlebotomy at bedside at this time.

## 2015-08-07 NOTE — ED Notes (Signed)
Pt. reports elevated blood pressure at home this evening 226/107 , denies any pain or discomfort , respirations unlabored , no nausea or vomitting .

## 2015-08-08 LAB — URINALYSIS, ROUTINE W REFLEX MICROSCOPIC
Bilirubin Urine: NEGATIVE
GLUCOSE, UA: NEGATIVE mg/dL
HGB URINE DIPSTICK: NEGATIVE
KETONES UR: NEGATIVE mg/dL
Leukocytes, UA: NEGATIVE
Nitrite: NEGATIVE
Protein, ur: NEGATIVE mg/dL
Specific Gravity, Urine: 1.014 (ref 1.005–1.030)
Urobilinogen, UA: 1 mg/dL (ref 0.0–1.0)
pH: 6 (ref 5.0–8.0)

## 2015-08-08 NOTE — Discharge Instructions (Signed)
Dizziness Dizziness is a common problem. It makes you feel unsteady or lightheaded. You may feel like you are about to pass out (faint). Dizziness can lead to injury if you stumble or fall. Anyone can get dizzy, but dizziness is more common in older adults. This condition can be caused by a number of things, including:  Medicines.  Dehydration.  Illness. HOME CARE Following these instructions may help with your condition: Eating and Drinking  Drink enough fluid to keep your pee (urine) clear or pale yellow. This helps to keep you from getting dehydrated. Try to drink more clear fluids, such as water.  Do not drink alcohol.  Limit how much caffeine you drink or eat if told by your doctor.  Limit how much salt you drink or eat if told by your doctor. Activity  Avoid making quick movements.  When you stand up from sitting in a chair, steady yourself until you feel okay.  In the morning, first sit up on the side of the bed. When you feel okay, stand slowly while you hold onto something. Do this until you know that your balance is fine.  Move your legs often if you need to stand in one place for a long time. Tighten and relax your muscles in your legs while you are standing.  Do not drive or use heavy machinery if you feel dizzy.  Avoid bending down if you feel dizzy. Place items in your home so that they are easy for you to reach without leaning over. Lifestyle  Do not use any tobacco products, including cigarettes, chewing tobacco, or electronic cigarettes. If you need help quitting, ask your doctor.  Try to lower your stress level, such as with yoga or meditation. Talk with your doctor if you need help. General Instructions  Watch your dizziness for any changes.  Take medicines only as told by your doctor. Talk with your doctor if you think that your dizziness is caused by a medicine that you are taking.  Tell a friend or a family member that you are feeling dizzy. If he or  she notices any changes in your behavior, have this person call your doctor.  Keep all follow-up visits as told by your doctor. This is important. GET HELP IF:  Your dizziness does not go away.  Your dizziness or light-headedness gets worse.  You feel sick to your stomach (nauseous).  You have trouble hearing.  You have new symptoms.  You are unsteady on your feet or you feel like the room is spinning. GET HELP RIGHT AWAY IF:  You throw up (vomit) or have diarrhea and are unable to eat or drink anything.  You have trouble:  Talking.  Walking.  Swallowing.  Using your arms, hands, or legs.  You feel generally weak.  You are not thinking clearly or you have trouble forming sentences. It may take a friend or family member to notice this.  You have:  Chest pain.  Pain in your belly (abdomen).  Shortness of breath.  Sweating.  Your vision changes.  You are bleeding.  You have a headache.  You have neck pain or a stiff neck.  You have a fever.   This information is not intended to replace advice given to you by your health care provider. Make sure you discuss any questions you have with your health care provider.   Document Released: 09/17/2011 Document Revised: 02/12/2015 Document Reviewed: 09/24/2014 Elsevier Interactive Patient Education 2016 [place] were evaluated for your  chronic DC spells your lab work is all within normal parameters.  Her EKG is unchanged.  Your blood pressure did decrease slightly while you were here in response to taking your blood pressure medicine about 8 PM your head CT was read by the radiologist as normal. As discussed, I feel it is important that you make an appointment with your primary care physician or your cardiologist to discuss your dizzy spells and perhaps get an ultrasound of your carotid arteries and a "tilt table test".

## 2015-09-24 ENCOUNTER — Encounter (INDEPENDENT_AMBULATORY_CARE_PROVIDER_SITE_OTHER): Payer: Medicare Other | Admitting: Ophthalmology

## 2015-09-24 DIAGNOSIS — H34831 Tributary (branch) retinal vein occlusion, right eye, with macular edema: Secondary | ICD-10-CM | POA: Diagnosis not present

## 2015-09-24 DIAGNOSIS — H35033 Hypertensive retinopathy, bilateral: Secondary | ICD-10-CM

## 2015-09-24 DIAGNOSIS — D3131 Benign neoplasm of right choroid: Secondary | ICD-10-CM | POA: Diagnosis not present

## 2015-09-24 DIAGNOSIS — H43813 Vitreous degeneration, bilateral: Secondary | ICD-10-CM

## 2015-09-24 DIAGNOSIS — I10 Essential (primary) hypertension: Secondary | ICD-10-CM | POA: Diagnosis not present

## 2015-09-28 ENCOUNTER — Encounter (HOSPITAL_COMMUNITY): Payer: Self-pay

## 2015-09-28 ENCOUNTER — Inpatient Hospital Stay (HOSPITAL_COMMUNITY)
Admission: EM | Admit: 2015-09-28 | Discharge: 2015-10-04 | DRG: 872 | Disposition: A | Discharge status: Home / Self Care | Payer: Medicare Other | Attending: Internal Medicine | Admitting: Internal Medicine

## 2015-09-28 ENCOUNTER — Inpatient Hospital Stay (HOSPITAL_COMMUNITY): Payer: Medicare Other

## 2015-09-28 ENCOUNTER — Emergency Department (HOSPITAL_COMMUNITY): Payer: Medicare Other

## 2015-09-28 DIAGNOSIS — J209 Acute bronchitis, unspecified: Secondary | ICD-10-CM | POA: Diagnosis present

## 2015-09-28 DIAGNOSIS — E119 Type 2 diabetes mellitus without complications: Secondary | ICD-10-CM | POA: Diagnosis present

## 2015-09-28 DIAGNOSIS — R002 Palpitations: Secondary | ICD-10-CM

## 2015-09-28 DIAGNOSIS — I2582 Chronic total occlusion of coronary artery: Secondary | ICD-10-CM | POA: Diagnosis present

## 2015-09-28 DIAGNOSIS — R0602 Shortness of breath: Secondary | ICD-10-CM | POA: Diagnosis present

## 2015-09-28 DIAGNOSIS — I480 Paroxysmal atrial fibrillation: Secondary | ICD-10-CM | POA: Diagnosis present

## 2015-09-28 DIAGNOSIS — Z7982 Long term (current) use of aspirin: Secondary | ICD-10-CM

## 2015-09-28 DIAGNOSIS — R0902 Hypoxemia: Secondary | ICD-10-CM | POA: Diagnosis present

## 2015-09-28 DIAGNOSIS — I10 Essential (primary) hypertension: Secondary | ICD-10-CM | POA: Diagnosis present

## 2015-09-28 DIAGNOSIS — B349 Viral infection, unspecified: Secondary | ICD-10-CM | POA: Diagnosis not present

## 2015-09-28 DIAGNOSIS — R6510 Systemic inflammatory response syndrome (SIRS) of non-infectious origin without acute organ dysfunction: Secondary | ICD-10-CM

## 2015-09-28 DIAGNOSIS — H9190 Unspecified hearing loss, unspecified ear: Secondary | ICD-10-CM | POA: Diagnosis present

## 2015-09-28 DIAGNOSIS — I119 Hypertensive heart disease without heart failure: Secondary | ICD-10-CM | POA: Diagnosis present

## 2015-09-28 DIAGNOSIS — A419 Sepsis, unspecified organism: Principal | ICD-10-CM | POA: Diagnosis present

## 2015-09-28 DIAGNOSIS — R509 Fever, unspecified: Secondary | ICD-10-CM

## 2015-09-28 DIAGNOSIS — R0600 Dyspnea, unspecified: Secondary | ICD-10-CM | POA: Diagnosis not present

## 2015-09-28 DIAGNOSIS — I25810 Atherosclerosis of coronary artery bypass graft(s) without angina pectoris: Secondary | ICD-10-CM | POA: Diagnosis present

## 2015-09-28 DIAGNOSIS — E782 Mixed hyperlipidemia: Secondary | ICD-10-CM | POA: Diagnosis present

## 2015-09-28 DIAGNOSIS — I4891 Unspecified atrial fibrillation: Secondary | ICD-10-CM | POA: Diagnosis not present

## 2015-09-28 DIAGNOSIS — I2510 Atherosclerotic heart disease of native coronary artery without angina pectoris: Secondary | ICD-10-CM | POA: Diagnosis present

## 2015-09-28 DIAGNOSIS — I483 Typical atrial flutter: Secondary | ICD-10-CM | POA: Diagnosis not present

## 2015-09-28 DIAGNOSIS — R000 Tachycardia, unspecified: Secondary | ICD-10-CM

## 2015-09-28 DIAGNOSIS — I25110 Atherosclerotic heart disease of native coronary artery with unstable angina pectoris: Secondary | ICD-10-CM | POA: Diagnosis not present

## 2015-09-28 DIAGNOSIS — I248 Other forms of acute ischemic heart disease: Secondary | ICD-10-CM | POA: Diagnosis present

## 2015-09-28 DIAGNOSIS — R931 Abnormal findings on diagnostic imaging of heart and coronary circulation: Secondary | ICD-10-CM | POA: Diagnosis not present

## 2015-09-28 DIAGNOSIS — I471 Supraventricular tachycardia: Secondary | ICD-10-CM | POA: Diagnosis present

## 2015-09-28 DIAGNOSIS — Z951 Presence of aortocoronary bypass graft: Secondary | ICD-10-CM | POA: Diagnosis not present

## 2015-09-28 DIAGNOSIS — Z79899 Other long term (current) drug therapy: Secondary | ICD-10-CM

## 2015-09-28 DIAGNOSIS — R9439 Abnormal result of other cardiovascular function study: Secondary | ICD-10-CM | POA: Clinically undetermined

## 2015-09-28 HISTORY — DX: Presence of aortocoronary bypass graft: Z95.1

## 2015-09-28 LAB — URINALYSIS, ROUTINE W REFLEX MICROSCOPIC
Glucose, UA: NEGATIVE mg/dL
KETONES UR: 15 mg/dL — AB
LEUKOCYTES UA: NEGATIVE
NITRITE: NEGATIVE
PROTEIN: NEGATIVE mg/dL
Specific Gravity, Urine: 1.028 (ref 1.005–1.030)
pH: 5.5 (ref 5.0–8.0)

## 2015-09-28 LAB — PROCALCITONIN: Procalcitonin: <0.1 ng/mL

## 2015-09-28 LAB — CBC WITH DIFFERENTIAL/PLATELET
BASOS PCT: 0 %
Basophils Absolute: 0 10*3/uL (ref 0.0–0.1)
EOS ABS: 0 10*3/uL (ref 0.0–0.7)
Eosinophils Relative: 0 %
HCT: 35.8 % — ABNORMAL LOW (ref 39.0–52.0)
HEMOGLOBIN: 12.2 g/dL — AB (ref 13.0–17.0)
Lymphocytes Relative: 6 %
Lymphs Abs: 0.5 10*3/uL — ABNORMAL LOW (ref 0.7–4.0)
MCH: 28.1 pg (ref 26.0–34.0)
MCHC: 34.1 g/dL (ref 30.0–36.0)
MCV: 82.5 fL (ref 78.0–100.0)
Monocytes Absolute: 1.1 10*3/uL — ABNORMAL HIGH (ref 0.1–1.0)
Monocytes Relative: 12 %
NEUTROS PCT: 82 %
Neutro Abs: 7.2 10*3/uL (ref 1.7–7.7)
Platelets: 203 10*3/uL (ref 150–400)
RBC: 4.34 MIL/uL (ref 4.22–5.81)
RDW: 13.6 % (ref 11.5–15.5)
WBC: 8.8 10*3/uL (ref 4.0–10.5)

## 2015-09-28 LAB — TROPONIN I
TROPONIN I: 0.04 ng/mL — AB (ref <0.031)
TROPONIN I: 0.37 ng/mL — AB (ref <0.031)
Troponin I: 0.34 ng/mL — ABNORMAL HIGH (ref <0.031)
Troponin I: 0.42 ng/mL — ABNORMAL HIGH (ref <0.031)

## 2015-09-28 LAB — BASIC METABOLIC PANEL
ANION GAP: 8 (ref 5–15)
BUN: 21 mg/dL — ABNORMAL HIGH (ref 6–20)
CHLORIDE: 106 mmol/L (ref 101–111)
CO2: 22 mmol/L (ref 22–32)
Calcium: 8 mg/dL — ABNORMAL LOW (ref 8.9–10.3)
Creatinine, Ser: 0.86 mg/dL (ref 0.61–1.24)
GFR calc non Af Amer: >60 mL/min (ref >60)
Glucose, Bld: 209 mg/dL — ABNORMAL HIGH (ref 65–99)
POTASSIUM: 4.2 mmol/L (ref 3.5–5.1)
SODIUM: 136 mmol/L (ref 135–145)

## 2015-09-28 LAB — INFLUENZA PANEL BY PCR (TYPE A & B)
H1N1FLUPCR: NOT DETECTED
INFLAPCR: NEGATIVE
INFLBPCR: NEGATIVE

## 2015-09-28 LAB — GLUCOSE, CAPILLARY: Glucose-Capillary: 138 mg/dL — ABNORMAL HIGH (ref 65–99)

## 2015-09-28 LAB — HEPATIC FUNCTION PANEL
ALT: 14 U/L — ABNORMAL LOW (ref 17–63)
AST: 16 U/L (ref 15–41)
Albumin: 3 g/dL — ABNORMAL LOW (ref 3.5–5.0)
Alkaline Phosphatase: 59 U/L (ref 38–126)
BILIRUBIN INDIRECT: 0.5 mg/dL (ref 0.3–0.9)
Bilirubin, Direct: 0.1 mg/dL (ref 0.1–0.5)
TOTAL PROTEIN: 5.7 g/dL — AB (ref 6.5–8.1)
Total Bilirubin: 0.6 mg/dL (ref 0.3–1.2)

## 2015-09-28 LAB — URINE MICROSCOPIC-ADD ON

## 2015-09-28 LAB — I-STAT TROPONIN, ED: TROPONIN I, POC: 0.02 ng/mL (ref 0.00–0.08)

## 2015-09-28 LAB — MAGNESIUM: MAGNESIUM: 1.8 mg/dL (ref 1.7–2.4)

## 2015-09-28 LAB — T4, FREE: Free T4: 0.8 ng/dL (ref 0.61–1.12)

## 2015-09-28 LAB — MRSA PCR SCREENING: MRSA by PCR: NEGATIVE

## 2015-09-28 LAB — TSH
TSH: 2.767 u[IU]/mL (ref 0.350–4.500)
TSH: 2.004 u[IU]/mL (ref 0.350–4.500)

## 2015-09-28 LAB — PROTIME-INR
INR: 1.21 (ref 0.00–1.49)
PROTHROMBIN TIME: 15.5 s — AB (ref 11.6–15.2)

## 2015-09-28 LAB — CBG MONITORING, ED
GLUCOSE-CAPILLARY: 188 mg/dL — AB (ref 65–99)
GLUCOSE-CAPILLARY: 121 mg/dL — AB (ref 65–99)

## 2015-09-28 LAB — I-STAT CG4 LACTIC ACID, ED: LACTIC ACID, VENOUS: 0.87 mmol/L (ref 0.5–2.0)

## 2015-09-28 LAB — APTT: aPTT: 30 seconds (ref 24–37)

## 2015-09-28 LAB — BRAIN NATRIURETIC PEPTIDE: B NATRIURETIC PEPTIDE 5: 307.3 pg/mL — AB (ref 0.0–100.0)

## 2015-09-28 MED ORDER — ACETAMINOPHEN 325 MG PO TABS
650.0000 mg | ORAL_TABLET | Freq: Four times a day (QID) | ORAL | Status: DC | PRN
Start: 2015-09-28 — End: 2015-10-04
  Administered 2015-09-28 – 2015-10-03 (×7): 650 mg via ORAL
  Filled 2015-09-28 (×7): qty 2

## 2015-09-28 MED ORDER — METOPROLOL TARTRATE 12.5 MG HALF TABLET
12.5000 mg | ORAL_TABLET | Freq: Two times a day (BID) | ORAL | Status: DC
  Administered 2015-09-28 – 2015-09-29 (×2): 12.5 mg via ORAL
  Filled 2015-09-28 (×3): qty 1

## 2015-09-28 MED ORDER — DM-GUAIFENESIN ER 30-600 MG PO TB12: 1.0000 | ORAL_TABLET | Freq: Two times a day (BID) | ORAL | Status: DC | PRN

## 2015-09-28 MED ORDER — ACETAMINOPHEN 650 MG RE SUPP: 650.0000 mg | Freq: Four times a day (QID) | RECTAL | Status: DC | PRN

## 2015-09-28 MED ORDER — LEVALBUTEROL HCL 1.25 MG/0.5ML IN NEBU
1.2500 mg | INHALATION_SOLUTION | Freq: Four times a day (QID) | RESPIRATORY_TRACT | Status: DC
  Administered 2015-09-28 – 2015-09-29 (×5): 1.25 mg via RESPIRATORY_TRACT
  Filled 2015-09-28 (×9): qty 0.5

## 2015-09-28 MED ORDER — DILTIAZEM HCL 100 MG IV SOLR
5.0000 mg/h | INTRAVENOUS | Status: DC
  Administered 2015-09-28: 5 mg/h via INTRAVENOUS
  Filled 2015-09-28: qty 100

## 2015-09-28 MED ORDER — SODIUM CHLORIDE 0.9 % IJ SOLN
3.0000 mL | Freq: Two times a day (BID) | INTRAMUSCULAR | Status: DC
Start: 2015-09-28 — End: 2015-10-04
  Administered 2015-09-28 – 2015-10-04 (×8): 3 mL via INTRAVENOUS

## 2015-09-28 MED ORDER — REGADENOSON 0.4 MG/5ML IV SOLN
0.4000 mg | Freq: Once | INTRAVENOUS | Status: AC
  Administered 2015-09-29: 0.4 mg via INTRAVENOUS
  Filled 2015-09-28: qty 5

## 2015-09-28 MED ORDER — SODIUM CHLORIDE 0.9 % IV BOLUS (SEPSIS)
1000.0000 mL | INTRAVENOUS | Status: AC
  Administered 2015-09-28: 1000 mL via INTRAVENOUS

## 2015-09-28 MED ORDER — PIPERACILLIN-TAZOBACTAM 3.375 G IVPB
3.3750 g | Freq: Three times a day (TID) | INTRAVENOUS | Status: DC
  Administered 2015-09-28 – 2015-09-30 (×5): 3.375 g via INTRAVENOUS
  Filled 2015-09-28 (×8): qty 50

## 2015-09-28 MED ORDER — SODIUM CHLORIDE 0.9 % IV BOLUS (SEPSIS)
500.0000 mL | INTRAVENOUS | Status: AC
  Administered 2015-09-28: 500 mL via INTRAVENOUS

## 2015-09-28 MED ORDER — SODIUM CHLORIDE 0.9 % IV BOLUS (SEPSIS)
1000.0000 mL | Freq: Once | INTRAVENOUS | Status: AC
  Administered 2015-09-28: 1000 mL via INTRAVENOUS

## 2015-09-28 MED ORDER — SODIUM CHLORIDE 0.9 % IV SOLN: 1000.0000 mL | INTRAVENOUS | Status: DC

## 2015-09-28 MED ORDER — PIPERACILLIN-TAZOBACTAM 3.375 G IVPB 30 MIN
3.3750 g | Freq: Once | INTRAVENOUS | Status: AC
  Administered 2015-09-28: 3.375 g via INTRAVENOUS
  Filled 2015-09-28: qty 50

## 2015-09-28 MED ORDER — SODIUM CHLORIDE 0.9 % IV SOLN
1000.0000 mL | INTRAVENOUS | Status: DC
  Administered 2015-09-28: 1000 mL via INTRAVENOUS

## 2015-09-28 MED ORDER — ASPIRIN EC 81 MG PO TBEC
81.0000 mg | DELAYED_RELEASE_TABLET | Freq: Every day | ORAL | Status: DC
  Administered 2015-09-28 – 2015-10-02 (×4): 81 mg via ORAL
  Filled 2015-09-28 (×4): qty 1

## 2015-09-28 MED ORDER — HEPARIN SODIUM (PORCINE) 5000 UNIT/ML IJ SOLN
5000.0000 [IU] | Freq: Three times a day (TID) | INTRAMUSCULAR | Status: DC
  Administered 2015-09-28 – 2015-10-01 (×9): 5000 [IU] via SUBCUTANEOUS
  Filled 2015-09-28 (×10): qty 1

## 2015-09-28 MED ORDER — ASPIRIN 81 MG PO CHEW
324.0000 mg | CHEWABLE_TABLET | Freq: Once | ORAL | Status: AC
  Administered 2015-09-28: 324 mg via ORAL
  Filled 2015-09-28: qty 4

## 2015-09-28 MED ORDER — VANCOMYCIN HCL IN DEXTROSE 1-5 GM/200ML-% IV SOLN
1000.0000 mg | Freq: Once | INTRAVENOUS | Status: AC
  Administered 2015-09-28: 1000 mg via INTRAVENOUS
  Filled 2015-09-28: qty 200

## 2015-09-28 MED ORDER — VANCOMYCIN HCL IN DEXTROSE 1-5 GM/200ML-% IV SOLN
1000.0000 mg | Freq: Two times a day (BID) | INTRAVENOUS | Status: DC
  Administered 2015-09-28 – 2015-09-30 (×4): 1000 mg via INTRAVENOUS
  Filled 2015-09-28 (×5): qty 200

## 2015-09-28 MED ORDER — INSULIN ASPART 100 UNIT/ML ~~LOC~~ SOLN
0.0000 [IU] | Freq: Three times a day (TID) | SUBCUTANEOUS | Status: DC
  Administered 2015-09-28: 1 [IU] via SUBCUTANEOUS
  Administered 2015-09-28: 2 [IU] via SUBCUTANEOUS
  Administered 2015-09-28: 1 [IU] via SUBCUTANEOUS
  Administered 2015-09-29: 2 [IU] via SUBCUTANEOUS
  Administered 2015-09-29 – 2015-09-30 (×2): 1 [IU] via SUBCUTANEOUS
  Administered 2015-09-30: 2 [IU] via SUBCUTANEOUS
  Administered 2015-10-01 – 2015-10-03 (×3): 1 [IU] via SUBCUTANEOUS
  Administered 2015-10-03: 2 [IU] via SUBCUTANEOUS
  Administered 2015-10-04: 1 [IU] via SUBCUTANEOUS
  Filled 2015-09-28: qty 1

## 2015-09-28 NOTE — ED Notes (Signed)
Checked patient blood sugar it was 121 notified RN blood sugar

## 2015-09-28 NOTE — ED Notes (Signed)
Patient converted to NSR, 71bpm. EKG Performed

## 2015-09-28 NOTE — Progress Notes (Signed)
ANTIBIOTIC CONSULT NOTE - INITIAL  Pharmacy Consult for Vancomycin and Zosyn Indication: sepsis  Allergies  Allergen Reactions  . Codeine     unknown  . Statins     "pass out, dizzy, drunk feeling, poor balance"    Patient Measurements:    Ht:  70 in   Wt: 79 kg  Vital Signs: Temp: 101.4 F (38.6 C) (12/17 0309) Temp Source: Oral (12/17 0309) BP: 113/92 mmHg (12/17 0630) Pulse Rate: 133 (12/17 0630) Intake/Output from previous day: 12/16 0701 - 12/17 0700 In: 2050 [I.V.:2050] Out: -  Intake/Output from this shift:    Labs:  Recent Labs  09/28/15 0343  WBC 8.8  HGB 12.2*  PLT 203  CREATININE 0.86   CrCl cannot be calculated (Unknown ideal weight.). No results for input(s): VANCOTROUGH, VANCOPEAK, VANCORANDOM, GENTTROUGH, GENTPEAK, GENTRANDOM, TOBRATROUGH, TOBRAPEAK, TOBRARND, AMIKACINPEAK, AMIKACINTROU, AMIKACIN in the last 72 hours.   Microbiology: No results found for this or any previous visit (from the past 720 hour(s)).  Medical History: Past Medical History  Diagnosis Date  . Hypertension   . Hyperlipidemia   . Degeneration of lumbar or lumbosacral intervertebral disc   . BPH (benign prostatic hypertrophy)   . Allergic rhinitis   . Dyslipidemia   . Right leg weakness   . Coronary atherosclerosis of native coronary artery   . CAD (coronary artery disease)     CABG 2006   . HOH (hard of hearing)   . Wears glasses     reading  . Diabetes ([place])     no meds    Medications:  See electronic med rec  Assessment: 81 y.o. M presents with SOB, palpitation, and fever. Code sepsis called. Pt received Vancomycin 1gm and Zosyn 3.375gm ~0400 in ED. To continue Vancomycin and Zosyn for sepsis - unclear source of infection. LA 0.87, PCT nl. WBC wnl. SCr 0.86, est CrCl 60 ml/min.  Goal of Therapy:  Vancomycin trough level 15-20 mcg/ml  Plan:  Zosyn 3.375gm IV q8h - subsequent doses over 4 hours Vancomycin 1gm IV q12h Will f/u micro data, renal function,  and pt's clinical condition Vanc trough prn  Sherlon Handing, PharmD, BCPS Clinical pharmacist, pager [phone] 09/28/2015,7:02 AM

## 2015-09-28 NOTE — Progress Notes (Signed)
  Echocardiogram 2D Echocardiogram has been performed.  Jonathan [name] 09/28/2015, 6:08 PM

## 2015-09-28 NOTE — Consult Note (Signed)
CARDIOLOGY CONSULT NOTE   Patient ID: Jonathan [name] MRN: [id number] DOB/AGE: 1929-06-26 81 y.o.  Admit Date: 09/28/2015 Referring Physician: [facility] Primary Physician: Irven Shelling, MD Consulting Cardiologist: Ottie Glazier MD Primary Cardiologist: Legrand Pitts MD Reason for Consultation: CAD, PAF, CHF  Clinical Summary Mr. Rosiles is a 81 y.o.male with known history of CAD, CABG  2006, (LIMA to LAD, SVG to PDA, SVG to OM1 and OM2), Hypertension, hyperlipidemia, and diabetes who was admitted with dyspnea and palpitations.  EMS brought patient to ER and noted that he was initally in SVT, treated with adenosine 6 mg and 12 mg. HR slowed to atrial fib. He was treated with IV diltiazem 10 mg in ER for HR of 130 bpm. and he converted to NSR. Vital signs in ER, BP 117/57, HR 109, O2 Sat 87%, febrile at 101.4.  Flu swab was negative for influenza.  Labs demonstrated troponin of 0.04; 0.34. WBC 8.8, Hgb 12.2, HCT 35.8; Glucose 209. K+ CXR ateletasis in the lung bases. Mild vascular congestion. He was treated also with insulin, ECASA, Xopenex, and heparin. EKG NSR with no acute ST-T wave changes. We are asked to see patient concerning his atrial arrhythmia, positive troponin and questionable CHF.   Patient states that he began to have problems about 18 months ago with his BP. He states that he was on Cozaar, but by the afternoon his BP would be too low and his HR would be racing. His PCP changed him to losartan and made it 25 mg TID about 5 days ago. He was feeling ok until last night. He awoke extremely dyspneic.   Allergies  Allergen Reactions  . Codeine     unknown  . Statins     "pass out, dizzy, drunk feeling, poor balance"    Medications Scheduled Medications: . aspirin EC  81 mg Oral Daily  . heparin  5,000 Units Subcutaneous 3 times per day  . insulin aspart  0-9 Units Subcutaneous TID WC  . levalbuterol  1.25 mg Nebulization 4 times per day  . metoprolol tartrate   12.5 mg Oral BID  . piperacillin-tazobactam (ZOSYN)  IV  3.375 g Intravenous 3 times per day  . sodium chloride  3 mL Intravenous Q12H  . vancomycin  1,000 mg Intravenous Q12H      PRN Medications: acetaminophen **OR** acetaminophen, dextromethorphan-guaiFENesin   Past Medical History  Diagnosis Date  . Hypertension   . Hyperlipidemia   . Degeneration of lumbar or lumbosacral intervertebral disc   . BPH (benign prostatic hypertrophy)   . Allergic rhinitis   . Dyslipidemia   . Right leg weakness   . Coronary atherosclerosis of native coronary artery   . CAD (coronary artery disease)     CABG 2006   . HOH (hard of hearing)   . Wears glasses     reading  . Diabetes ([place])     no meds    Past Surgical History  Procedure Laterality Date  . Rotator cuff repair    . Laminectomy    . Bil carpal tunnel surgery    . Hemorrhoid surgery    . Transurethral resection of prostate    . Tonsillectomy    . Colonoscopy    . Eye surgery      both cataracts  . Dupuytren contracture release Left 04/05/2014    Procedure: EXCISION DUPUYTREN LEFT PALM, RING, SMALL, AND THUMB;  Surgeon: Cammie Sickle, MD;  Location: [facility];  Service: Orthopedics;  Laterality: Left;    Family History  Problem Relation Age of Onset  . Hypertension Father     Social History Mr. Normoyle reports that he has never smoked. He does not have any smokeless tobacco history on file. Mr. Belville reports that he does not drink alcohol.  Review of Systems Complete review of systems are found to be negative unless outlined in H&P above.  Physical Examination Blood pressure 133/69, pulse 85, temperature 98.9 F (37.2 C), temperature source Oral, resp. rate 15, height 5\' 7"  (1.702 m), weight 176 lb 12.9 oz (80.2 kg), SpO2 93 %.  Intake/Output Summary (Last 24 hours) at 09/28/15 1557 Last data filed at 09/28/15 0530  Gross per 24 hour  Intake   2050 ml  Output      0 ml  Net   2050 ml     Telemetry: NSR rates in the 80's.   GEN: No acute distress. VERY hard of hearing.  HEENT: Conjunctiva and lids normal, oropharynx clear with moist mucosa. Neck: Supple, no elevated JVP or carotid bruits, no thyromegaly. Lungs: Clear to auscultation, nonlabored breathing at rest. Cardiac: Regular rate and rhythm, no S3 or significant systolic murmur, no pericardial rub. Abdomen: Soft, nontender, no hepatomegaly, bowel sounds present, no guarding or rebound. Extremities: No pitting edema, distal pulses 2+. Skin: Warm and dry. Musculoskeletal: No kyphosis. Neuropsychiatric: Alert and oriented x3, affect grossly appropriate.  Prior Cardiac Testing/Procedures Cardiac Cath 2006 CABG 2006  Lab Results  Basic Metabolic Panel:  Recent Labs Lab 09/28/15 0343  NA 136  K 4.2  CL 106  CO2 22  GLUCOSE 209*  BUN 21*  CREATININE 0.86  CALCIUM 8.0*  MG 1.8    Liver Function Tests:  Recent Labs Lab 09/28/15 0343  AST 16  ALT 14*  ALKPHOS 59  BILITOT 0.6  PROT 5.7*  ALBUMIN 3.0*    CBC:  Recent Labs Lab 09/28/15 0343  WBC 8.8  NEUTROABS 7.2  HGB 12.2*  HCT 35.8*  MCV 82.5  PLT 203    Cardiac Enzymes:  Recent Labs Lab 09/28/15 0615 09/28/15 1440  TROPONINI 0.04* 0.34*     Radiology: Dg Chest 2 View  09/28/2015  CLINICAL DATA:  Fever, shortness of breath, and cough for 2 days. EXAM: CHEST  2 VIEW COMPARISON:  04/20/2005 FINDINGS: Postoperative changes in the mediastinum. Shallow inspiration with atelectasis in the lung bases. No focal consolidation. Mild cardiac enlargement. Mild vascular congestion. No edema. No blunting of costophrenic angles. No pneumothorax. Calcified and tortuous aorta. Degenerative changes in the spine and shoulders. IMPRESSION: Shallow inspiration with atelectasis in the lung bases. Cardiac enlargement with mild vascular congestion. No focal consolidation or edema. Electronically Signed   By: Lucienne Capers M.D.   On: 09/28/2015  03:37     ECG:  Initially, sinus tachycardia with PAC's and ST depression inferior.    Impression and Recommendations  1. Rapid Heart Rate: H&P states he was in SVT at home and treated with adenosine 35m and 12 mg by EMS. Was found to be in atrial fib after given adenosine. Converted to NSR after IV diltiazem. Has been having episodes of rapid HR since being changed from Cozaar to losartan, with decreased BP late in the day. Will  Check TSH.   Troponin is slightly elevated at 0.34 likely from demand ischemia. Agree with Echo. Follow enzymes. He is currently pain free.   2. Hx of CAD with 3 Vessel CABG in 2006: Started on metoprolol 12.5 mg BID by  TRH. Not on statin. Intolerant of statins. He remains on ASA. Consider MPI study vs cardiac cath has it has been 10 years since CABG and he has not been studied.  He has been more fatigued lately.   3. Hypertension: Has had difficulty with hypotension earlier in the day with racing HR. Medications have been adjusted to a smaller dose TID. Agree with echo.   4. CHF:  No evidence of CHF on CXR. He was given one dose of lasix by [facility] but not on diuretic currently.   5. Febrile: Currently on Zosyn IV.    Signed: Phill Myron. Lawrence NP [place]  09/28/2015, 3:57 PM Co-Sign MD  History and all data above reviewed.  Patient examined.  I agree with the findings as above.  The patient exam reveals COR:RRR  ,  Lungs: Clear  ,  Abd: Positive bowel sounds, no rebound no guarding , Ext No edema  .  All available labs, radiology testing, previous records reviewed. Agree with documented assessment and plan. Palpitations:  I do not see atrial fib but NSR with MAT.  He has a mild troponin elevation but I viewed the echo and the EF is well preserved.  Plan Lexiscan Myoview in the AM.  Need records from EMS to see what the rhythm was when they got to him.  Discontinue ACE.  Started low dose of beta blocker although we know that he is very sensitive to meds.    Jeneen Rinks  Nur Rabold  5:43 PM  09/28/2015

## 2015-09-28 NOTE — ED Notes (Signed)
Per EMS: Pt woke up this am with SOB. EMS states pt in SVT. Gave 6mg  Adenosine, no change. Gave a second 12mg  adenosine, slowed HR to show Afib. EMS then gave 10mg  Cardizem, states conversion to ST with premature beats. Pt in afib rate of 130's upon arrival. Pt no longer complaining of SOB. Bp 132/8, 82% RA.

## 2015-09-28 NOTE — H&P (Addendum)
[facility] History and Physical  Jonathan [name] [id number] DOB: 1928/10/22 DOA: 09/28/2015  Referring physician: ED physician PCP: Irven Shelling, MD  Specialists:   Chief Complaint: Shortness of breath, palpitation and fever  HPI: Jonathan [name] is a 81 y.o. male with PMH of hypertension, hyperlipidemia, diet-controlled diabetes, CAD, S/P CABG pain years ago, back pain, BPH, who presents with shortness of breath, palpitation and fever.  Pt reports that he suddenly started having SOB and palpitation in the midnight. He did not have chest pain, cough, runny nose or sore throat. Per EMS, pt was in SVT initially and was given 6 mg Adenosine to no relief. He was then given 12 mg Adenosine, which slowed his HR to show A-Fib. 10 mg Cardizem was given following the Adenosine which showed conversion to ST with premature beats. Per triage note, the pt was in A-Fib in 130s upon arrival. He had oxygen desaturation to 87%, which improved to 96% on nasal cannula.  When I saw pt in ED, he is sweating. His HR is 130 to 140. His shortness of breath has resolved. He does not have chest pain, cough, tenderness over calf areas, abdominal pain, diarrhea, unilateral weakness. He denies dysuria or burning on urination, but states that he has increased urinary frequency for 18 months. His PCP is currently making adjustment for his blood pressure medication, losartan. Pt states that he is on 25 mg Losartan 3x a day and has been for over 2 years. He reports that it has been a "trial and error" process where he will experience "heart beat skips" about 9 hours after taking the medication, particularly if his blood pressure drops to about 100.  In ED, patient was found to have temperature 101.4, tachycardia, lactate 0.82, negative troponin, WBC 8.8, electrolytes and renal function okay, negative chest x-ray for acute abnormalities. Patient is admitted to inpatient for further eval urgent treatment.  Where  does patient live?   At home    Can patient participate in ADLs?  some  Review of Systems:   General: has fevers, chills, no changes in body weight, has fatigue HEENT: no blurry vision, hearing changes or sore throat Pulm: has dyspnea, no coughing, wheezing CV: no chest pain, has palpitations Abd: no nausea, vomiting, abdominal pain, diarrhea, constipation GU: no dysuria, burning on urination, increased urinary frequency, hematuria  Ext: no leg edema Neuro: no unilateral weakness, numbness, or tingling, no vision change or hearing loss Skin: no rash MSK: No muscle spasm, no deformity, no limitation of range of movement in spin Heme: No easy bruising.  Travel history: No recent long distant travel.  Allergy:  Allergies  Allergen Reactions  . Codeine     unknown  . Statins     "pass out, dizzy, drunk feeling, poor balance"    Past Medical History  Diagnosis Date  . Hypertension   . Hyperlipidemia   . Degeneration of lumbar or lumbosacral intervertebral disc   . BPH (benign prostatic hypertrophy)   . Allergic rhinitis   . Dyslipidemia   . Right leg weakness   . Coronary atherosclerosis of native coronary artery   . CAD (coronary artery disease)     CABG 2006   . HOH (hard of hearing)   . Wears glasses     reading  . Diabetes ([place])     no meds    Past Surgical History  Procedure Laterality Date  . Rotator cuff repair    . Laminectomy    . Bil  carpal tunnel surgery    . Hemorrhoid surgery    . Transurethral resection of prostate    . Tonsillectomy    . Colonoscopy    . Eye surgery      both cataracts  . Dupuytren contracture release Left 04/05/2014    Procedure: EXCISION DUPUYTREN LEFT PALM, RING, SMALL, AND THUMB;  Surgeon: Cammie Sickle, MD;  Location: [facility];  Service: Orthopedics;  Laterality: Left;    Social History:  reports that he has never smoked. He does not have any smokeless tobacco history on file. He reports that he does not  drink alcohol or use illicit drugs.  Family History:  Family History  Problem Relation Age of Onset  . Hypertension Father      Prior to Admission medications   Medication Sig Start Date End Date Taking? Authorizing Provider  aspirin EC 81 MG tablet Take 81 mg by mouth daily.   Yes Historical Provider, MD  losartan (COZAAR) 25 MG tablet Take 25 mg by mouth 3 (three) times daily.   Yes Historical Provider, MD  cephALEXin (KEFLEX) 500 MG capsule Take 1 capsule (500 mg total) by mouth 3 (three) times daily. Patient not taking: Reported on 08/07/2015 04/05/14   Theodis Sato, MD  HYDROmorphone (DILAUDID) 2 MG tablet Take 1 tablet (2 mg total) by mouth every 4 (four) hours as needed for moderate pain or severe pain. Patient not taking: Reported on 08/07/2015 04/05/14   Theodis Sato, MD    Physical Exam: Filed Vitals:   09/28/15 0530 09/28/15 0545 09/28/15 0600 09/28/15 0630  BP: 110/66 113/65 123/74 113/92  Pulse: 89 46 90 133  Temp:      TempSrc:      Resp: 19 19 20 22   SpO2: 98% 97% 99% 99%   General: Not in acute distress HEENT:       Eyes: PERRL, EOMI, no scleral icterus.       ENT: No discharge from the ears and nose, no pharynx injection, no tonsillar enlargement.        Neck: No JVD, no bruit, no mass felt. Heme: No neck lymph node enlargement. Cardiac: S1/S2, irregular rhythm, No murmurs, No gallops or rubs. Pulm:  No rales, wheezing, rhonchi or rubs. Abd: Soft, nondistended, nontender, no rebound pain, no organomegaly, BS present. Ext: No pitting leg edema bilaterally. 2+DP/PT pulse bilaterally. Musculoskeletal: No joint deformities, No joint redness or warmth, no limitation of ROM in spin. Skin: No rashes.  Neuro: Alert, oriented X3, cranial nerves II-XII grossly intact, muscle strength 5/5 in all extremities, sensation to light touch intact. B Psych: Patient is not psychotic, no suicidal or hemocidal ideation.  Labs on Admission:  Basic Metabolic Panel:  Recent  Labs Lab 09/28/15 0343  NA 136  K 4.2  CL 106  CO2 22  GLUCOSE 209*  BUN 21*  CREATININE 0.86  CALCIUM 8.0*  MG 1.8   Liver Function Tests:  Recent Labs Lab 09/28/15 0343  AST 16  ALT 14*  ALKPHOS 59  BILITOT 0.6  PROT 5.7*  ALBUMIN 3.0*   No results for input(s): LIPASE, AMYLASE in the last 168 hours. No results for input(s): AMMONIA in the last 168 hours. CBC:  Recent Labs Lab 09/28/15 0343  WBC 8.8  NEUTROABS 7.2  HGB 12.2*  HCT 35.8*  MCV 82.5  PLT 203   Cardiac Enzymes: No results for input(s): CKTOTAL, CKMB, CKMBINDEX, TROPONINI in the last 168 hours.  BNP (last 3 results) No  results for input(s): BNP in the last 8760 hours.  ProBNP (last 3 results) No results for input(s): PROBNP in the last 8760 hours.  CBG: No results for input(s): GLUCAP in the last 168 hours.  Radiological Exams on Admission: Dg Chest 2 View  09/28/2015  CLINICAL DATA:  Fever, shortness of breath, and cough for 2 days. EXAM: CHEST  2 VIEW COMPARISON:  04/20/2005 FINDINGS: Postoperative changes in the mediastinum. Shallow inspiration with atelectasis in the lung bases. No focal consolidation. Mild cardiac enlargement. Mild vascular congestion. No edema. No blunting of costophrenic angles. No pneumothorax. Calcified and tortuous aorta. Degenerative changes in the spine and shoulders. IMPRESSION: Shallow inspiration with atelectasis in the lung bases. Cardiac enlargement with mild vascular congestion. No focal consolidation or edema. Electronically Signed   By: Lucienne Capers M.D.   On: 09/28/2015 03:37    EKG: Independently reviewed. QTC 483, sinus tachycardia, sinus arrhythmia vs MAT.   Assessment/Plan Principal Problem:   Sepsis ([place]) Active Problems:   Coronary atherosclerosis of native coronary artery   Essential hypertension, benign   Mixed hyperlipidemia   Fever   SOB (shortness of breath)   Atrial fibrillation with RVR ([facility])   Diabetes mellitus without  complication ([place])   Hypoxia  Sepsis ([place]): pt is septic with fever, tachycardia, tachypnea on admission. The source of the infection is not clear. He has SOB with oxygen desaturation, pointing to possible respiratory infection, but CXR is negative and patient does not have respiratory symptoms. He has chronic urinary frequency for 18 month, UTI is also possible. Currently patient's hemodynamically stable.  -will admit to SDU due to need of Cardizem gtt -IV vanco and zosyn was started by EDP, will continue -f/u Bx, UA, Ux, flu pcr -will get Procalcitonin and trend lactic acid levels per sepsis protocol. -IVF: 2.5L of NS bolus in ED, followed by 100 cc/h   SOB: unclear etiology. Patient does not have chest pain, no signs of DVT, unlikely to have pulmonary embolism. -See above -Xopenex nebulizer when necessary  CAD: s/p of CABG 10 years ago. No chest pain. But presents with new onset Afib with RVR - continue ASA - cycle CE q6 x3 and repeat her EKG in the am  - Risk factor stratification: will check FLP and A1C  - 2d echo  Possible new onset Atrial Fibrillation with RVR: CHA2DS2-VASc Score is 4, needs oral anticoagulation if become persistent. Currently pt is in sinus arrhythmia vs. MAT -Will need to consult to Card for recommendation -f/u trop x 3 and 2d ehco -check TSH, Free t4 and t3 -IV cardizem gtt  Essential hypertension, benign: -on Cardizem gtt -hold losartan losartan  HLD: Last LDL was not on record, patient is not taking medications currently. -Check FLP  Diet-controlled DM-II: Last A1c not on record. Patient is not taking medications at home. Blood sugar 20 on the map -SSI -Check A1c  DVT ppx: SQ Heparin   Code Status: Full code Family Communication:   Yes, patient's wife and son  at bed side Disposition Plan: Admit to inpatient   Date of Service 09/28/2015    Ivor Costa [facility] Pager [phone]  If 7PM-7AM, please contact  night-coverage [email or web address] Password [email or web address] 09/28/2015, 7:05 AM

## 2015-09-28 NOTE — ED Notes (Addendum)
Cardizem stopped per Admitting MD. Jonathan [name] with admitting MD regarding patient's bed request, states will look at his chart to determine if he is now able to go to Telemetry Unit.

## 2015-09-28 NOTE — ED Notes (Signed)
Dr. Donna Bernard at bedside with patient/family at this time.

## 2015-09-28 NOTE — Progress Notes (Signed)
Patient is seen & examined. Please see today's progress note for the details. 81 y/o male with PMH of HTN, CAD h/o CABG is admitted with SOB, palpitation. Found to have arrhyhtmia, initially SVT given adenosine in EMS, then a fib RVR started on IV Cardizem in ED. Currently in NSR, off Cardizem.  Also suspected possible sepsis, since he and subjective fever, mild leukocytosis on admission but no clear source.  -clinically crackles on exam. probable chf in the setting of a fib RVR prior to admission. We will hold IVF. Check BnP. Echo. He is not in respiratory distress at this time. But will consider IV Lasix as needed. Obtain serial trop.  -A fib. Resolved currently in NSR. Of Cardizem. Start low dose BB. Cont ASA. Monitor. Pend echo, likely needs anticoagulation. Consulted cardiology  -also thought possible sepsis on admission no clear source. obtain CT chest WO contrast for better eval. Deescalate IV atx soon if no source of infection ans improving   Chandy Tarman N 3:42 PM

## 2015-09-28 NOTE — ED Provider Notes (Signed)
By signing my name below, I, Jonathan [name], attest that this documentation has been prepared under the direction and in the presence of [place], DO. Electronically Signed: Irene [name], ED Scribe. 09/28/2015. 2:59 AM.  TIME SEEN: 2:47 AM  CHIEF COMPLAINT: SOB, palpitations  HPI:  HPI Comments: Jonathan [name] is a 81 y.o. male with a hx of CAD, HTN, and DM brought in by EMS who presents to the Emergency Department complaining of shortness of breath and palpitations that started today. Per EMS, pt was in SVT and was given 6 mg Adenosine to no relief. He was then given 12 mg Adenosine, which slowed his HR to show A-Fib. 10 mg Cardizem was given following the Adenosine which showed conversion to ST with premature beats. Per triage note, the pt was in A-Fib in 130s upon arrival. EMS vital stats were BP 132/80 and 82% on RA. He does not wear oxygen at home. Pt is no longer complaining of SOB but reports nausea after receiving medication by EMS. Pt states that he is on 25 mg Losartan 3x a day and has been for over 2 years. He reports that it has been a "trial and error" process where he will experience "heart beat skips" about 8 hours after taking the medication. He blames his palpitations on his losartan. He states that his HR is only irregular when he is on Losartan. Pt is also on baby aspirin. He denies use of blood thinners, fever, chills, coughing, vomiting, diarrhea, dizziness, diaphoresis, chest pressure or chest tightness. No known history of SVT are atrophic relation. He denies that he has had any chest pain or chest discomfort. He states that he does not have a regular cardiologist, but has seen his PCP for this problem. He states that Dr. Laurann Montana has been adjusting his medication.    Patient found to be febrile in the emergency department. He states he did not know he was having a fever. No cough, vomiting or diarrhea. No rash. No sick contacts.   PCP: Irven Shelling, MD  ROS:  See HPI Constitutional:  fever  Eyes: no drainage  ENT: no runny nose   Cardiovascular:  no chest pain  Resp: SOB  GI: no vomiting GU: no dysuria Integumentary: no rash  Allergy: no hives  Musculoskeletal: no leg swelling  Neurological: no slurred speech ROS otherwise negative  PAST MEDICAL HISTORY/PAST SURGICAL HISTORY:  Past Medical History  Diagnosis Date  . Hypertension   . Hyperlipidemia   . Degeneration of lumbar or lumbosacral intervertebral disc   . BPH (benign prostatic hypertrophy)   . Allergic rhinitis   . Dyslipidemia   . Right leg weakness   . Coronary atherosclerosis of native coronary artery   . CAD (coronary artery disease)     CABG 2006   . HOH (hard of hearing)   . Wears glasses     reading  . Diabetes ([place])     no meds    MEDICATIONS:  Prior to Admission medications   Medication Sig Start Date End Date Taking? Authorizing Provider  cephALEXin (KEFLEX) 500 MG capsule Take 1 capsule (500 mg total) by mouth 3 (three) times daily. Patient not taking: Reported on 08/07/2015 04/05/14   Theodis Sato, MD  HYDROmorphone (DILAUDID) 2 MG tablet Take 1 tablet (2 mg total) by mouth every 4 (four) hours as needed for moderate pain or severe pain. Patient not taking: Reported on 08/07/2015 04/05/14   Theodis Sato, MD  losartan (COZAAR) 100 MG tablet  Take 50 mg by mouth daily.    Historical Provider, MD    ALLERGIES:  Allergies  Allergen Reactions  . Codeine     unknown  . Statins     "pass out, dizzy, drunk feeling, poor balance"    SOCIAL HISTORY:  Social History  Substance Use Topics  . Smoking status: Never Smoker   . Smokeless tobacco: Not on file  . Alcohol Use: No    FAMILY HISTORY: Family History  Problem Relation Age of Onset  . Hypertension Father     EXAM: BP 120/64 mmHg  Pulse 51  Temp(Src) 101.4 F (38.6 C) (Oral)  Resp 19  SpO2 96% CONSTITUTIONAL: Alert and oriented and responds appropriately to questions. Well-appearing;  well-nourished; elderly; febrile HEAD: Normocephalic EYES: Conjunctivae clear, PERRL ENT: normal nose; no rhinorrhea; moist mucous membranes; pharynx without lesions noted NECK: Supple, no meningismus, no LAD  CARD: irregular, tachycardic; S1 and S2 appreciated; no murmurs, no clicks, no rubs, no gallops RESP: Normal chest excursion without splinting or tachypnea; breath sounds clear and equal bilaterally; no wheezes, no rhonchi, no rales, no  respiratory distress, speaking full sentences, patient is hypoxic on room air ABD/GI: Normal bowel sounds; non-distended; soft, non-tender, no rebound, no guarding, no peritoneal signs BACK:  The back appears normal and is non-tender to palpation, there is no CVA tenderness EXT: Normal ROM in all joints; non-tender to palpation; no edema; normal capillary refill; no cyanosis, no calf tenderness or swelling    SKIN: Normal color for age and race; warm, no rash NEURO: Moves all extremities equally, sensation to light touch intact diffusely, cranial nerves II through XII intact PSYCH: The patient's mood and manner are appropriate. Grooming and personal hygiene are appropriate.  MEDICAL DECISION MAKING: Patient here with fever, tachycardia. No hypertension but he did have hypoxia and has no oxygen requirement at home. Concern for possible pneumonia. We'll obtain labs, urine, cultures, chest x-ray. We'll give IV fluids and broad-spectrum antibiotics.  ED PROGRESS: Patient's labs showed no leukocytosis, normal lactate. Troponin is negative. Chest x-ray shows no pneumonia but suspect this may be pneumonia clinically that we are not seeing given his hypoxia, fever. Urine shows no sign of infection. We'll discuss with hospitalist for admission. Heart rate is improving and he is still hemodynamically stable. Mentating normally. Patient and family comfortable with this plan.   5:30 AM  D/w Dr. Blaine Hamper for admission to telemetry, inpatient. Patient and family updated with  plan. I will place holding orders per his request.   EKG Interpretation  Date/Time:  Saturday September 28 2015 02:01:25 EST Ventricular Rate:  136 PR Interval:  106 QRS Duration: 94 QT Interval:  321 QTC Calculation: 483 R Axis:   -25 Text Interpretation:  Sinus tachycardia with irregular rhythm Borderline left axis deviation Abnormal R-wave progression, late transition ST depression, probably rate related Confirmed by Frank Pilger,  DO, Rennee Coyne [id number]) on 09/28/2015 3:13:36 AM         EKG Interpretation  Date/Time:  Saturday September 28 2015 03:00:48 EST Ventricular Rate:  130 PR Interval:  157 QRS Duration: 104 QT Interval:  335 QTC Calculation: 493 R Axis:   14 Text Interpretation:  Sinus tachycardia with irregular rate Abnormal R-wave progression, late transition ST depression, probably rate related Minimal ST elevation, inferior leads Confirmed by Tomisha Reppucci,  DO, Kendle Turbin [id number]) on 09/28/2015 3:14:07 AM       CRITICAL CARE Performed by: Nyra Jabs   Total critical care time: 35 minutes  Critical care  time was exclusive of separately billable procedures and treating other patients.  Critical care was necessary to treat or prevent imminent or life-threatening deterioration.  Critical care was time spent personally by me on the following activities: development of treatment plan with patient and/or surrogate as well as nursing, discussions with consultants, evaluation of patient's response to treatment, examination of patient, obtaining history from patient or surrogate, ordering and performing treatments and interventions, ordering and review of laboratory studies, ordering and review of radiographic studies, pulse oximetry and re-evaluation of patient's condition.    I personally performed the services described in this documentation, which was scribed in my presence. The recorded information has been reviewed and is accurate.    [place], DO 09/28/15 [phone]

## 2015-09-28 NOTE — ED Notes (Signed)
CBG- 188 

## 2015-09-29 ENCOUNTER — Inpatient Hospital Stay (HOSPITAL_COMMUNITY): Payer: Medicare Other

## 2015-09-29 DIAGNOSIS — R0600 Dyspnea, unspecified: Secondary | ICD-10-CM

## 2015-09-29 DIAGNOSIS — I483 Typical atrial flutter: Secondary | ICD-10-CM

## 2015-09-29 LAB — LIPID PANEL
Cholesterol: 154 mg/dL (ref 0–200)
HDL: 19 mg/dL — AB (ref >40)
LDL CALC: 112 mg/dL — AB (ref 0–99)
TRIGLYCERIDES: 116 mg/dL (ref <150)
Total CHOL/HDL Ratio: 8.1 RATIO
VLDL: 23 mg/dL (ref 0–40)

## 2015-09-29 LAB — GLUCOSE, CAPILLARY
GLUCOSE-CAPILLARY: 141 mg/dL — AB (ref 65–99)
GLUCOSE-CAPILLARY: 178 mg/dL — AB (ref 65–99)
Glucose-Capillary: 125 mg/dL — ABNORMAL HIGH (ref 65–99)
Glucose-Capillary: 156 mg/dL — ABNORMAL HIGH (ref 65–99)
Glucose-Capillary: 171 mg/dL — ABNORMAL HIGH (ref 65–99)

## 2015-09-29 LAB — COMPREHENSIVE METABOLIC PANEL
ALT: 18 U/L (ref 17–63)
ANION GAP: 5 (ref 5–15)
AST: 19 U/L (ref 15–41)
Albumin: 2.6 g/dL — ABNORMAL LOW (ref 3.5–5.0)
Alkaline Phosphatase: 51 U/L (ref 38–126)
BILIRUBIN TOTAL: 0.8 mg/dL (ref 0.3–1.2)
BUN: 17 mg/dL (ref 6–20)
CO2: 26 mmol/L (ref 22–32)
Calcium: 7.8 mg/dL — ABNORMAL LOW (ref 8.9–10.3)
Chloride: 105 mmol/L (ref 101–111)
Creatinine, Ser: 0.94 mg/dL (ref 0.61–1.24)
GLUCOSE: 134 mg/dL — AB (ref 65–99)
POTASSIUM: 4.1 mmol/L (ref 3.5–5.1)
Sodium: 136 mmol/L (ref 135–145)
TOTAL PROTEIN: 5.3 g/dL — AB (ref 6.5–8.1)

## 2015-09-29 LAB — T3, FREE: T3 FREE: 2.1 pg/mL (ref 2.0–4.4)

## 2015-09-29 LAB — CBC
HEMATOCRIT: 30.6 % — AB (ref 39.0–52.0)
HEMOGLOBIN: 10.4 g/dL — AB (ref 13.0–17.0)
MCH: 28.2 pg (ref 26.0–34.0)
MCHC: 34 g/dL (ref 30.0–36.0)
MCV: 82.9 fL (ref 78.0–100.0)
Platelets: 190 10*3/uL (ref 150–400)
RBC: 3.69 MIL/uL — ABNORMAL LOW (ref 4.22–5.81)
RDW: 13.8 % (ref 11.5–15.5)
WBC: 6 10*3/uL (ref 4.0–10.5)

## 2015-09-29 LAB — URINE CULTURE: CULTURE: NO GROWTH

## 2015-09-29 LAB — TROPONIN I: Troponin I: 0.26 ng/mL — ABNORMAL HIGH (ref <0.031)

## 2015-09-29 MED ORDER — TECHNETIUM TC 99M SESTAMIBI GENERIC - CARDIOLITE
10.0000 | Freq: Once | INTRAVENOUS | Status: AC | PRN
  Administered 2015-09-29: 10 via INTRAVENOUS

## 2015-09-29 MED ORDER — TRAZODONE HCL 50 MG PO TABS
50.0000 mg | ORAL_TABLET | Freq: Once | ORAL | Status: AC
  Administered 2015-09-29: 50 mg via ORAL
  Filled 2015-09-29: qty 1

## 2015-09-29 MED ORDER — LEVALBUTEROL HCL 1.25 MG/0.5ML IN NEBU: 1.2500 mg | INHALATION_SOLUTION | Freq: Four times a day (QID) | RESPIRATORY_TRACT | Status: DC | PRN

## 2015-09-29 MED ORDER — TECHNETIUM TC 99M SESTAMIBI - CARDIOLITE
30.0000 | Freq: Once | INTRAVENOUS | Status: AC | PRN
  Administered 2015-09-29: 30 via INTRAVENOUS

## 2015-09-29 MED ORDER — METOPROLOL TARTRATE 1 MG/ML IV SOLN
INTRAVENOUS | Status: AC
  Filled 2015-09-29: qty 5

## 2015-09-29 MED ORDER — METOPROLOL TARTRATE 25 MG PO TABS
25.0000 mg | ORAL_TABLET | Freq: Once | ORAL | Status: AC
Start: 2015-09-29 — End: 2015-09-29
  Administered 2015-09-29: 25 mg via ORAL
  Filled 2015-09-29: qty 1

## 2015-09-29 MED ORDER — REGADENOSON 0.4 MG/5ML IV SOLN
INTRAVENOUS | Status: AC
  Filled 2015-09-29: qty 5

## 2015-09-29 MED ORDER — METOPROLOL TARTRATE 1 MG/ML IV SOLN
5.0000 mg | Freq: Once | INTRAVENOUS | Status: AC
  Administered 2015-09-29: 5 mg via INTRAVENOUS

## 2015-09-29 MED ORDER — ALPRAZOLAM 0.25 MG PO TABS
0.2500 mg | ORAL_TABLET | Freq: Once | ORAL | Status: AC | PRN
  Administered 2015-10-01: 0.25 mg via ORAL
  Filled 2015-09-29: qty 1

## 2015-09-29 NOTE — Progress Notes (Signed)
Pt taken back to [facility] for scan. Noted HR A-fib with rate 120's-140's. Pt eating crackers and drinking sprite. States he feels his heart racing a little.

## 2015-09-29 NOTE — Progress Notes (Signed)
SUBJECTIVE:  No acute chest pain or SOB   PHYSICAL EXAM Filed Vitals:   09/29/15 0958 09/29/15 0959 09/29/15 1001 09/29/15 1140  BP: 128/63 121/59 126/66 129/66  Pulse:    91  Temp:      TempSrc:      Resp:    28  Height:      Weight:      SpO2:    95%   General:  No distress Lungs:  Clear Heart:  Regular with ectopy Abdomen:  Positive bowel sounds, no rebound no guarding Extremities:  No edema   LABS: Lab Results  Component Value Date   TROPONINI 0.26* 09/29/2015   Results for orders placed or performed during the hospital encounter of 09/28/15 (from the past 24 hour(s))  CBG monitoring, ED     Status: Abnormal   Collection Time: 09/28/15 12:27 PM  Result Value Ref Range   Glucose-Capillary 121 (H) 65 - 99 mg/dL  Influenza panel by PCR (type A & B, H1N1)     Status: None   Collection Time: 09/28/15  1:12 PM  Result Value Ref Range   Influenza A By PCR NEGATIVE NEGATIVE   Influenza B By PCR NEGATIVE NEGATIVE   H1N1 flu by pcr NOT DETECTED NOT DETECTED  MRSA PCR Screening     Status: None   Collection Time: 09/28/15  1:36 PM  Result Value Ref Range   MRSA by PCR NEGATIVE NEGATIVE  Troponin I (q 6hr x 3)     Status: Abnormal   Collection Time: 09/28/15  2:40 PM  Result Value Ref Range   Troponin I 0.34 (H) <0.031 ng/mL  Glucose, capillary     Status: Abnormal   Collection Time: 09/28/15  6:09 PM  Result Value Ref Range   Glucose-Capillary 138 (H) 65 - 99 mg/dL  Troponin I (q 6hr x 3)     Status: Abnormal   Collection Time: 09/28/15  6:32 PM  Result Value Ref Range   Troponin I 0.42 (H) <0.031 ng/mL  Brain natriuretic peptide     Status: Abnormal   Collection Time: 09/28/15  6:32 PM  Result Value Ref Range   B Natriuretic Peptide 307.3 (H) 0.0 - 100.0 pg/mL  TSH     Status: None   Collection Time: 09/28/15  6:32 PM  Result Value Ref Range   TSH 2.004 0.350 - 4.500 uIU/mL  Glucose, capillary     Status: Abnormal   Collection Time: 09/28/15  9:28 PM    Result Value Ref Range   Glucose-Capillary 141 (H) 65 - 99 mg/dL   Comment 1 Capillary Specimen   Troponin I (q 6hr x 3)     Status: Abnormal   Collection Time: 09/28/15  9:40 PM  Result Value Ref Range   Troponin I 0.37 (H) <0.031 ng/mL  Troponin I (q 6hr x 3)     Status: Abnormal   Collection Time: 09/29/15  3:37 AM  Result Value Ref Range   Troponin I 0.26 (H) <0.031 ng/mL  Comprehensive metabolic panel     Status: Abnormal   Collection Time: 09/29/15  3:37 AM  Result Value Ref Range   Sodium 136 135 - 145 mmol/L   Potassium 4.1 3.5 - 5.1 mmol/L   Chloride 105 101 - 111 mmol/L   CO2 26 22 - 32 mmol/L   Glucose, Bld 134 (H) 65 - 99 mg/dL   BUN 17 6 - 20 mg/dL   Creatinine, Ser 0.94 0.61 -  1.24 mg/dL   Calcium 7.8 (L) 8.9 - 10.3 mg/dL   Total Protein 5.3 (L) 6.5 - 8.1 g/dL   Albumin 2.6 (L) 3.5 - 5.0 g/dL   AST 19 15 - 41 U/L   ALT 18 17 - 63 U/L   Alkaline Phosphatase 51 38 - 126 U/L   Total Bilirubin 0.8 0.3 - 1.2 mg/dL   GFR calc non Af Amer >60 >60 mL/min   GFR calc Af Amer >60 >60 mL/min   Anion gap 5 5 - 15  CBC     Status: Abnormal   Collection Time: 09/29/15  3:37 AM  Result Value Ref Range   WBC 6.0 4.0 - 10.5 K/uL   RBC 3.69 (L) 4.22 - 5.81 MIL/uL   Hemoglobin 10.4 (L) 13.0 - 17.0 g/dL   HCT 30.6 (L) 39.0 - 52.0 %   MCV 82.9 78.0 - 100.0 fL   MCH 28.2 26.0 - 34.0 pg   MCHC 34.0 30.0 - 36.0 g/dL   RDW 13.8 11.5 - 15.5 %   Platelets 190 150 - 400 K/uL  Lipid panel     Status: Abnormal   Collection Time: 09/29/15  3:37 AM  Result Value Ref Range   Cholesterol 154 0 - 200 mg/dL   Triglycerides 116 <150 mg/dL   HDL 19 (L) >40 mg/dL   Total CHOL/HDL Ratio 8.1 RATIO   VLDL 23 0 - 40 mg/dL   LDL Cholesterol 112 (H) 0 - 99 mg/dL  Glucose, capillary     Status: Abnormal   Collection Time: 09/29/15  8:03 AM  Result Value Ref Range   Glucose-Capillary 125 (H) 65 - 99 mg/dL   Comment 1 Capillary Specimen     Intake/Output Summary (Last 24 hours) at 09/29/15  1148 Last data filed at 09/29/15 0808  Gross per 24 hour  Intake    700 ml  Output    575 ml  Net    125 ml    ASSESSMENT AND PLAN:  ATRIAL FLUTTER:  He had definite flutter today.  He is reluctant to believe that this is a chronic problem.  He blames much on his ARB which he is no longer taking.   Mr. HALBERT REZNICK has a CHA2DS2 - VASc score of 4 with a risk of stroke of 4%.  However, he wants to hold of on starting Xarelto until he checks with his ophthalmologist about any risk.  He is having ongoing injections in his eyes.    CAD:  Echo results are pending.  However, he has normal EF as I was viewing the images in real time.  Lexiscan Myoview results pending.  I doubt ischemia but we can follow up on these results.  Jeneen Rinks [facility] 09/29/2015 11:48 AM

## 2015-09-29 NOTE — Progress Notes (Signed)
Called Jory Sims, [place] and notified of heart rate. Orders given.

## 2015-09-29 NOTE — Progress Notes (Signed)
Called and spoke with Everlene Farrier, RN regarding pts heart rate and dose of Metoprolol given. Also informed her of monitor being placed on stand by. Pt remains awake and in no distress.

## 2015-09-29 NOTE — Progress Notes (Signed)
Noted monitor to be placed on standby by floor.

## 2015-09-29 NOTE — Progress Notes (Signed)
PT Cancellation Note  Patient Details Name: Jonathan [name] MRN: [id number] DOB: [birth year]   Cancelled Treatment:    Reason Eval/Treat Not Completed: Patient not medically ready, will await improvement in status to proceed with mobility evaluation.  MD, please indicate when pt appropriate for PT activities.  Thank you,    Herbie Drape 09/29/2015, 1:42 PM

## 2015-09-29 NOTE — Progress Notes (Signed)
[facility] PROGRESS NOTE  Jonathan [name] [id number] DOB: [birth year] DOA: 09/28/2015 PCP: Irven Shelling, MD  Length of stay: 1   Assessment/Plan: Principal Problem:   Sepsis ([place]) Active Problems:   Coronary atherosclerosis of native coronary artery   Essential hypertension, benign   Mixed hyperlipidemia   Fever   SOB (shortness of breath)   Atrial fibrillation with RVR ([place])   Diabetes mellitus without complication ([place])   Hypoxia  History of present illness 81 y.o.male with known history of CAD, CABG 2006, (LIMA to LAD, SVG to PDA, SVG to OM1 and OM2), Hypertension, hyperlipidemia, and diabetes who was admitted with dyspnea and palpitations.  EMS brought patient to ER and noted that he was initally in SVT, treated with adenosine 6 mg and 12 mg. HR slowed to atrial fib. He was treated with IV diltiazem 10 mg in ER for HR of 130 bpm. and he converted to NSR. Vital signs in ER, BP 117/57, HR 109, O2 Sat 87%, febrile at 101.4. Flu swab was negative for influenza.  Labs demonstrated troponin of 0.04; 0.34. WBC 8.8, Hgb 12.2, HCT 35.8; Glucose 209. K+ CXR ateletasis in the lung bases. Mild vascular congestion. He was treated also with insulin, ECASA, Xopenex, and heparin. EKG NSR with no acute ST-T wave changes. We are asked to see patient concerning his atrial arrhythmia, positive troponin and questionable CHF.   Patient states that he began to have problems about 18 months ago with his BP. He states that he was on Cozaar, but by the afternoon his BP would be too low and his HR would be racing. His PCP changed him to losartan and made it 25 mg TID about 5 days ago. He was feeling ok until last night. He awoke extremely dyspneic.   Assessment and plan  Sepsis [facility]): pt is septic with fever, tachycardia, tachypnea on admission. The source of the infection is not clear. He has SOB with oxygen desaturation, pointing to possible respiratory infection, but CXR/CT chest  is negative and patient does not have respiratory symptoms. Influenza PCR negative Patient initiated on empiric antibiotics, UA negative Continue SDU due to need of Cardizem gtt -IV vanco and zosyn was started by EDP, will continue Follow blood culture Pro calcitonin negative, normal lactic acid   SOB: Likely secondary to arrhythmia, NSR with MAT,abnormal troponin,- echo, lexiscan myoview   CAD: s/p of CABG 10 years ago. No chest pain. But presents with new onset Afib with RVR - continue ASA - cycle CE q6 x3 and repeat her EKG in the am  - 2d echo, NSR with MAT,Lexiscan Myoview   Possible new onset Atrial Fibrillation with RVR: CHA2DS2-VASc Score is 4, needs oral anticoagulation if become persistent. Currently pt is in sinus arrhythmia vs. MAT -f/u trop x 3 and 2d ehco   TSH, Free t4 and t3-within normal limits Currently receiving IV Lopressor, further recommendations per cardiology  Essential hypertension, benign: Of Cardizem gtt, receiving IV Lopressor, PO metoprolol -hold losartan    HLD: Last LDL was not on record, patient is not taking medications currently. -Check FLP  Diet-controlled DM-II: Last A1c not on record. Patient is not taking medications at home. Blood sugar 20 on the map -SSI Hemoglobin A1c pending   DVT prophylaxsis heparin  Code Status:      Code Status Orders        Start     Ordered   09/28/15 0609  Full code   Continuous  09/28/15 0609      Family Communication: Discussed in detail with the patient, all imaging results, lab results explained to the patient   Disposition Plan:  As per cardiology       Consultants:  Cardiology  Procedures:  Myoview  Antibiotics: Anti-infectives    Start     Dose/Rate Route Frequency Ordered Stop   09/28/15 1700  vancomycin (VANCOCIN) IVPB 1000 mg/200 mL premix     1,000 mg 200 mL/hr over 60 Minutes Intravenous Every 12 hours 09/28/15 0708     09/28/15 1200  piperacillin-tazobactam (ZOSYN)  IVPB 3.375 g     3.375 g 12.5 mL/hr over 240 Minutes Intravenous 3 times per day 09/28/15 0708     09/28/15 0330  piperacillin-tazobactam (ZOSYN) IVPB 3.375 g     3.375 g 100 mL/hr over 30 Minutes Intravenous  Once 09/28/15 0315 09/28/15 0529   09/28/15 0330  vancomycin (VANCOCIN) IVPB 1000 mg/200 mL premix     1,000 mg 200 mL/hr over 60 Minutes Intravenous  Once 09/28/15 0315 09/28/15 0853         HPI/Subjective: Sinus rhythm overnight, back in A. fib during his stress test, given 1 dose of IV metoprolol  Objective: Filed Vitals:   09/29/15 0926 09/29/15 0958 09/29/15 0959 09/29/15 1001  BP: 152/69 128/63 121/59 126/66  Pulse:      Temp:      TempSrc:      Resp:      Height:      Weight:      SpO2:        Intake/Output Summary (Last 24 hours) at 09/29/15 1101 Last data filed at 09/29/15 [id number]  Gross per 24 hour  Intake    700 ml  Output    575 ml  Net    125 ml    Exam:  General: No acute respiratory distress Lungs: Clear to auscultation bilaterally without wheezes or crackles Cardiovascular: Regular rate and rhythm without murmur gallop or rub normal S1 and S2 Abdomen: Nontender, nondistended, soft, bowel sounds positive, no rebound, no ascites, no appreciable mass Extremities: No significant cyanosis, clubbing, or edema bilateral lower extremities     Data Review   Micro Results Recent Results (from the past 240 hour(s))  Blood Culture (routine x 2)     Status: None (Preliminary result)   Collection Time: 09/28/15  3:38 AM  Result Value Ref Range Status   Specimen Description BLOOD RIGHT ARM  Final   Special Requests BOTTLES DRAWN AEROBIC AND ANAEROBIC 10ML  Final   Culture NO GROWTH 1 DAY  Final   Report Status PENDING  Incomplete  Blood Culture (routine x 2)     Status: None (Preliminary result)   Collection Time: 09/28/15  3:43 AM  Result Value Ref Range Status   Specimen Description BLOOD LEFT ARM  Final   Special Requests BOTTLES DRAWN AEROBIC  AND ANAEROBIC 10ML  Final   Culture NO GROWTH 1 DAY  Final   Report Status PENDING  Incomplete  MRSA PCR Screening     Status: None   Collection Time: 09/28/15  1:36 PM  Result Value Ref Range Status   MRSA by PCR NEGATIVE NEGATIVE Final    Comment:        The GeneXpert MRSA Assay (FDA approved for NASAL specimens only), is one component of a comprehensive MRSA colonization surveillance program. It is not intended to diagnose MRSA infection nor to guide or monitor treatment for MRSA infections.  Radiology Reports Dg Chest 2 View  09/28/2015  CLINICAL DATA:  Fever, shortness of breath, and cough for 2 days. EXAM: CHEST  2 VIEW COMPARISON:  04/20/2005 FINDINGS: Postoperative changes in the mediastinum. Shallow inspiration with atelectasis in the lung bases. No focal consolidation. Mild cardiac enlargement. Mild vascular congestion. No edema. No blunting of costophrenic angles. No pneumothorax. Calcified and tortuous aorta. Degenerative changes in the spine and shoulders. IMPRESSION: Shallow inspiration with atelectasis in the lung bases. Cardiac enlargement with mild vascular congestion. No focal consolidation or edema. Electronically Signed   By: Lucienne Capers M.D.   On: 09/28/2015 03:37   Ct Chest Wo Contrast  09/28/2015  CLINICAL DATA:  Shortness of breath. Past medical history of hypertension, coronary artery disease, palpitations, hyperlipidemia. EXAM: CT CHEST WITHOUT CONTRAST TECHNIQUE: Multidetector CT imaging of the chest was performed following the standard protocol without IV contrast. COMPARISON:  None. FINDINGS: Heart size is upper normal. No pericardial effusion. Coronary artery calcifications noted. Patient is status post CABG. Scattered atherosclerotic changes noted along the walls of the normal-caliber thoracic aorta. Scattered small lymph nodes within the mediastinum. No mass or enlarged lymph nodes identified within the mediastinum or perihilar regions. Mild  scarring/atelectasis noted at each lung base. Trace pleural effusions versus pleural thickening at each lung base. Lungs otherwise clear. No evidence of pneumonia. No evidence of congestive heart failure. No pneumothorax. Trachea and central bronchi are unremarkable. Stone within the gallbladder measures at least 1.8 cm diameter. Limited images of the upper abdomen are otherwise unremarkable. Mild degenerative change noted throughout the thoracic spine. No acute appearing osseous abnormality IMPRESSION: 1. No evidence of acute intrathoracic abnormality. No evidence of pneumonia. No significant pleural effusion. Heart size is upper normal. No evidence of congestive heart failure. 2. Mild atelectasis/scarring at each lung base. Trace pleural effusions, versus more likely chronic pleural thickening, at each lung base. 3. Cholelithiasis, incompletely imaged, without obvious evidence of acute cholecystitis. Electronically Signed   By: Franki Cabot M.D.   On: 09/28/2015 22:49     CBC  Recent Labs Lab 09/28/15 0343 09/29/15 0337  WBC 8.8 6.0  HGB 12.2* 10.4*  HCT 35.8* 30.6*  PLT 203 190  MCV 82.5 82.9  MCH 28.1 28.2  MCHC 34.1 34.0  RDW 13.6 13.8  LYMPHSABS 0.5*  --   MONOABS 1.1*  --   EOSABS 0.0  --   BASOSABS 0.0  --     Chemistries   Recent Labs Lab 09/28/15 0343 09/29/15 0337  NA 136 136  K 4.2 4.1  CL 106 105  CO2 22 26  GLUCOSE 209* 134*  BUN 21* 17  CREATININE 0.86 0.94  CALCIUM 8.0* 7.8*  MG 1.8  --   AST 16 19  ALT 14* 18  ALKPHOS 59 51  BILITOT 0.6 0.8   ------------------------------------------------------------------------------------------------------------------ estimated creatinine clearance is 59.3 mL/min (by C-G formula based on Cr of 0.94). ------------------------------------------------------------------------------------------------------------------ No results for input(s): HGBA1C in the last 72  hours. ------------------------------------------------------------------------------------------------------------------  Recent Labs  09/29/15 0337  CHOL 154  HDL 19*  LDLCALC 112*  TRIG 116  CHOLHDL 8.1   ------------------------------------------------------------------------------------------------------------------  Recent Labs  09/28/15 0615 09/28/15 1832  TSH  --  2.004  T3FREE 2.1  --    ------------------------------------------------------------------------------------------------------------------ No results for input(s): VITAMINB12, FOLATE, FERRITIN, TIBC, IRON, RETICCTPCT in the last 72 hours.  Coagulation profile  Recent Labs Lab 09/28/15 0615  INR 1.21    No results for input(s): DDIMER in the last 72 hours.  Cardiac Enzymes  Recent Labs Lab 09/28/15 1832 09/28/15 2140 09/29/15 0337  TROPONINI 0.42* 0.37* 0.26*   ------------------------------------------------------------------------------------------------------------------ Invalid input(s): POCBNP   CBG:  Recent Labs Lab 09/28/15 0811 09/28/15 1227 09/28/15 1809 09/28/15 2128 09/29/15 0803  GLUCAP 188* 121* 138* 141* 125*       Studies: Dg Chest 2 View  09/28/2015  CLINICAL DATA:  Fever, shortness of breath, and cough for 2 days. EXAM: CHEST  2 VIEW COMPARISON:  04/20/2005 FINDINGS: Postoperative changes in the mediastinum. Shallow inspiration with atelectasis in the lung bases. No focal consolidation. Mild cardiac enlargement. Mild vascular congestion. No edema. No blunting of costophrenic angles. No pneumothorax. Calcified and tortuous aorta. Degenerative changes in the spine and shoulders. IMPRESSION: Shallow inspiration with atelectasis in the lung bases. Cardiac enlargement with mild vascular congestion. No focal consolidation or edema. Electronically Signed   By: Lucienne Capers M.D.   On: 09/28/2015 03:37   Ct Chest Wo Contrast  09/28/2015  CLINICAL DATA:  Shortness of  breath. Past medical history of hypertension, coronary artery disease, palpitations, hyperlipidemia. EXAM: CT CHEST WITHOUT CONTRAST TECHNIQUE: Multidetector CT imaging of the chest was performed following the standard protocol without IV contrast. COMPARISON:  None. FINDINGS: Heart size is upper normal. No pericardial effusion. Coronary artery calcifications noted. Patient is status post CABG. Scattered atherosclerotic changes noted along the walls of the normal-caliber thoracic aorta. Scattered small lymph nodes within the mediastinum. No mass or enlarged lymph nodes identified within the mediastinum or perihilar regions. Mild scarring/atelectasis noted at each lung base. Trace pleural effusions versus pleural thickening at each lung base. Lungs otherwise clear. No evidence of pneumonia. No evidence of congestive heart failure. No pneumothorax. Trachea and central bronchi are unremarkable. Stone within the gallbladder measures at least 1.8 cm diameter. Limited images of the upper abdomen are otherwise unremarkable. Mild degenerative change noted throughout the thoracic spine. No acute appearing osseous abnormality IMPRESSION: 1. No evidence of acute intrathoracic abnormality. No evidence of pneumonia. No significant pleural effusion. Heart size is upper normal. No evidence of congestive heart failure. 2. Mild atelectasis/scarring at each lung base. Trace pleural effusions, versus more likely chronic pleural thickening, at each lung base. 3. Cholelithiasis, incompletely imaged, without obvious evidence of acute cholecystitis. Electronically Signed   By: Franki Cabot M.D.   On: 09/28/2015 22:49      No results found for: HGBA1C Lab Results  Component Value Date   LDLCALC 112* 09/29/2015   CREATININE 0.94 09/29/2015       Scheduled Meds: . aspirin EC  81 mg Oral Daily  . heparin  5,000 Units Subcutaneous 3 times per day  . insulin aspart  0-9 Units Subcutaneous TID WC  . levalbuterol  1.25 mg  Nebulization 4 times per day  . metoprolol      . metoprolol      . metoprolol tartrate  12.5 mg Oral BID  . piperacillin-tazobactam (ZOSYN)  IV  3.375 g Intravenous 3 times per day  . regadenoson      . sodium chloride  3 mL Intravenous Q12H  . vancomycin  1,000 mg Intravenous Q12H   Continuous Infusions:   Principal Problem:   Sepsis ([place]) Active Problems:   Coronary atherosclerosis of native coronary artery   Essential hypertension, benign   Mixed hyperlipidemia   Fever   SOB (shortness of breath)   Atrial fibrillation with RVR ([facility])   Diabetes mellitus without complication ([facility])   Hypoxia    Time spent: 45 minutes  [facility] Pager [phone]. If 7PM-7AM, please contact night-coverage at [email or web address], password [facility] 09/29/2015, 11:01 AM  LOS: 1 day

## 2015-09-29 NOTE — Progress Notes (Signed)
Noted CR monitor to be on standby. Turned back on with Pt's HR 122. Metoprolol given. Pt in no distress. States he still feels his heart racing a little but better.

## 2015-09-30 ENCOUNTER — Encounter (HOSPITAL_COMMUNITY): Payer: Self-pay | Admitting: Cardiology

## 2015-09-30 DIAGNOSIS — R9439 Abnormal result of other cardiovascular function study: Secondary | ICD-10-CM | POA: Clinically undetermined

## 2015-09-30 DIAGNOSIS — I10 Essential (primary) hypertension: Secondary | ICD-10-CM

## 2015-09-30 DIAGNOSIS — A419 Sepsis, unspecified organism: Principal | ICD-10-CM

## 2015-09-30 DIAGNOSIS — R509 Fever, unspecified: Secondary | ICD-10-CM

## 2015-09-30 DIAGNOSIS — I2510 Atherosclerotic heart disease of native coronary artery without angina pectoris: Secondary | ICD-10-CM

## 2015-09-30 DIAGNOSIS — I4891 Unspecified atrial fibrillation: Secondary | ICD-10-CM

## 2015-09-30 LAB — COMPREHENSIVE METABOLIC PANEL
ALK PHOS: 51 U/L (ref 38–126)
ALT: 19 U/L (ref 17–63)
ANION GAP: 8 (ref 5–15)
AST: 21 U/L (ref 15–41)
Albumin: 2.8 g/dL — ABNORMAL LOW (ref 3.5–5.0)
BILIRUBIN TOTAL: 0.8 mg/dL (ref 0.3–1.2)
BUN: 14 mg/dL (ref 6–20)
CALCIUM: 7.9 mg/dL — AB (ref 8.9–10.3)
CO2: 26 mmol/L (ref 22–32)
Chloride: 104 mmol/L (ref 101–111)
Creatinine, Ser: 0.95 mg/dL (ref 0.61–1.24)
GFR calc non Af Amer: >60 mL/min (ref >60)
Glucose, Bld: 147 mg/dL — ABNORMAL HIGH (ref 65–99)
POTASSIUM: 3.8 mmol/L (ref 3.5–5.1)
SODIUM: 138 mmol/L (ref 135–145)
TOTAL PROTEIN: 5.5 g/dL — AB (ref 6.5–8.1)

## 2015-09-30 LAB — NM MYOCAR MULTI W/SPECT W/WALL MOTION / EF
CHL CUP STRESS STAGE 1 GRADE: 0 %
CHL CUP STRESS STAGE 1 SPEED: 0 mph
CHL CUP STRESS STAGE 2 GRADE: 0 %
CHL CUP STRESS STAGE 2 HR: 73 {beats}/min
CHL CUP STRESS STAGE 4 SPEED: 0 mph
CSEPPBP: 126 mmHg
CSEPPMHR: 66 %
Estimated workload: 1 METS
Peak HR: 90 {beats}/min
Stage 1 DBP: 69 mmHg
Stage 1 HR: 73 {beats}/min
Stage 1 SBP: 152 mmHg
Stage 2 Speed: 0 mph
Stage 3 DBP: 59 mmHg
Stage 3 Grade: 0 %
Stage 3 HR: 91 {beats}/min
Stage 3 SBP: 121 mmHg
Stage 3 Speed: 0 mph
Stage 4 DBP: 66 mmHg
Stage 4 Grade: 0 %
Stage 4 HR: 90 {beats}/min
Stage 4 SBP: 126 mmHg

## 2015-09-30 LAB — CBC
HCT: 32.1 % — ABNORMAL LOW (ref 39.0–52.0)
HEMOGLOBIN: 11 g/dL — AB (ref 13.0–17.0)
MCH: 28.1 pg (ref 26.0–34.0)
MCHC: 34.3 g/dL (ref 30.0–36.0)
MCV: 81.9 fL (ref 78.0–100.0)
Platelets: 193 10*3/uL (ref 150–400)
RBC: 3.92 MIL/uL — ABNORMAL LOW (ref 4.22–5.81)
RDW: 13.5 % (ref 11.5–15.5)
WBC: 5.7 10*3/uL (ref 4.0–10.5)

## 2015-09-30 LAB — GLUCOSE, CAPILLARY
GLUCOSE-CAPILLARY: 152 mg/dL — AB (ref 65–99)
GLUCOSE-CAPILLARY: 109 mg/dL — AB (ref 65–99)
GLUCOSE-CAPILLARY: 135 mg/dL — AB (ref 65–99)
Glucose-Capillary: 148 mg/dL — ABNORMAL HIGH (ref 65–99)

## 2015-09-30 LAB — HEMOGLOBIN A1C
HEMOGLOBIN A1C: 8.1 % — AB (ref 4.8–5.6)
MEAN PLASMA GLUCOSE: 186 mg/dL

## 2015-09-30 MED ORDER — SODIUM CHLORIDE 0.9 % IV SOLN: 250.0000 mL | INTRAVENOUS | Status: DC | PRN

## 2015-09-30 MED ORDER — SODIUM CHLORIDE 0.9 % IJ SOLN
3.0000 mL | Freq: Two times a day (BID) | INTRAMUSCULAR | Status: DC
  Administered 2015-09-30: 3 mL via INTRAVENOUS

## 2015-09-30 MED ORDER — SODIUM CHLORIDE 0.9 % WEIGHT BASED INFUSION
1.0000 mL/kg/h | INTRAVENOUS | Status: DC
  Administered 2015-09-30: 1 mL/kg/h via INTRAVENOUS

## 2015-09-30 MED ORDER — SODIUM CHLORIDE 0.9 % IJ SOLN: 3.0000 mL | INTRAMUSCULAR | Status: DC | PRN

## 2015-09-30 MED ORDER — METOPROLOL TARTRATE 25 MG PO TABS
25.0000 mg | ORAL_TABLET | Freq: Two times a day (BID) | ORAL | Status: DC
  Administered 2015-09-30 (×2): 25 mg via ORAL
  Filled 2015-09-30 (×2): qty 1

## 2015-09-30 MED ORDER — DEXTROSE 5 % IV SOLN
1.0000 g | INTRAVENOUS | Status: DC
  Administered 2015-09-30 – 2015-10-03 (×4): 1 g via INTRAVENOUS
  Filled 2015-09-30 (×5): qty 10

## 2015-09-30 MED ORDER — AZITHROMYCIN 500 MG PO TABS
500.0000 mg | ORAL_TABLET | Freq: Every day | ORAL | Status: DC
Start: 2015-09-30 — End: 2015-10-04
  Administered 2015-09-30 – 2015-10-04 (×4): 500 mg via ORAL
  Filled 2015-09-30 (×6): qty 1

## 2015-09-30 MED ORDER — ASPIRIN 81 MG PO CHEW
81.0000 mg | CHEWABLE_TABLET | ORAL | Status: AC
  Administered 2015-10-01: 81 mg via ORAL
  Filled 2015-09-30: qty 1

## 2015-09-30 NOTE — Progress Notes (Signed)
PT Cancellation Note  Patient Details Name: Jonathan [name] MRN: [id number] DOB: 16-Nov-1928   Cancelled Treatment:    Reason Eval/Treat Not Completed: Fatigue/lethargy limiting ability to participate; second attempt, patient continues to feel poorly and wished to wait to try and participate in AM prior to cath.  Will attempt again tomorrow.   Reginia Naas 09/30/2015, 4:48 PM  Magda Kiel, [place] 09/30/2015

## 2015-09-30 NOTE — Evaluation (Signed)
Occupational Therapy Evaluation Patient Details Name: Jonathan [name] MRN: [id number] DOB: 11/09/1928 Today's Date: 09/30/2015    History of Present Illness Pt admitted with sepsis, SOB, possible new onset afib with RVR.  PMH: HTN, HLD, DM, CAD s/p CABG, back pain, BPH.   Clinical Impression   Pt reports being independent prior to admission in mobility and ADL. Per RN, pt assisted his wife.  Pt presents with impaired cognition, although he is oriented,  and decreased safety awareness. He demonstrates impaired standing balance interfering with ability to perform ADL.  No family available at time of assessment.  Will follow acutely. Anticipate pt will be able to return home with 24 hour supervision of his wife or son and daughter in law who live next door to pt.    Follow Up Recommendations  Home health OT;Supervision/Assistance - 24 hour    Equipment Recommendations   (TBD)    Recommendations for Other Services       Precautions / Restrictions Precautions Precautions: Fall      Mobility Bed Mobility Overal bed mobility: Modified Independent                Transfers Overall transfer level: Needs assistance Equipment used: 1 person hand held assist Transfers: Sit to/from Stand;Stand Pivot Transfers Sit to Stand: Min guard Stand pivot transfers: Min assist            Balance                                            ADL Overall ADL's : Needs assistance/impaired Eating/Feeding: Independent;Sitting   Grooming: Wash/dry hands;Min guard;Standing   Upper Body Bathing: Set up;Sitting   Lower Body Bathing: Minimal assistance;Sit to/from stand   Upper Body Dressing : Set up;Sitting   Lower Body Dressing: Minimal assistance;Sit to/from stand Lower Body Dressing Details (indicate cue type and reason): able to don and doff socks Toilet Transfer: Stand-pivot;Min guard Toilet Transfer Details (indicate cue type and reason): simulated bed to  chair Toileting- Clothing Manipulation and Hygiene: Minimal assistance;Sit to/from stand               Vision     Perception     Praxis      Pertinent Vitals/Pain Pain Assessment: No/denies pain     Hand Dominance Right   Extremity/Trunk Assessment Upper Extremity Assessment Upper Extremity Assessment: Overall WFL for tasks assessed   Lower Extremity Assessment Lower Extremity Assessment: Defer to PT evaluation       Communication Communication Communication: No difficulties   Cognition Arousal/Alertness: Awake/alert Behavior During Therapy: Impulsive Overall Cognitive Status: No family/caregiver present to determine baseline cognitive functioning Area of Impairment: Memory;Safety/judgement     Memory: Decreased short-term memory   Safety/Judgement: Decreased awareness of safety;Decreased awareness of deficits         General Comments       Exercises       Shoulder Instructions      Home Living Family/patient expects to be discharged to:: Private residence Living Arrangements: Spouse/significant other Available Help at Discharge: Family;Available 24 hours/day Type of Home: House Home Access: Stairs to enter [employer] of Steps: 1 Entrance Stairs-Rails: Right Home Layout: One level     Bathroom Shower/Tub: Occupational psychologist: Standard     Home Equipment: None          Prior Functioning/Environment Level  of Independence: Independent        Comments: pt is a questionable historian    OT Diagnosis: Generalized weakness;Cognitive deficits   OT Problem List: Impaired balance (sitting and/or standing);Decreased cognition;Decreased safety awareness;Decreased knowledge of use of DME or AE   OT Treatment/Interventions: Self-care/ADL training;DME and/or AE instruction;Patient/family education;Balance training    OT Goals(Current goals can be found in the care plan section) Acute Rehab OT Goals Patient Stated  Goal: go home OT Goal Formulation: With patient Time For Goal Achievement: 10/14/15 Potential to Achieve Goals: Good ADL Goals Pt Will Perform Grooming: standing;with modified independence Pt Will Perform Lower Body Bathing: sit to/from stand;with modified independence Pt Will Perform Lower Body Dressing: with modified independence;sit to/from stand Pt Will Transfer to Toilet: with modified independence;ambulating;regular height toilet Pt Will Perform Toileting - Clothing Manipulation and hygiene: with modified independence;sit to/from stand Pt Will Perform Tub/Shower Transfer: Shower transfer;ambulating;with supervision (determine need for shower seat)  OT Frequency: Min 2X/week   Barriers to D/C:            Co-evaluation              End of Session Equipment Utilized During Treatment: Gait belt Nurse Communication: Mobility status  Activity Tolerance: Patient tolerated treatment well Patient left: in chair;with call bell/phone within reach;with chair alarm set   Time: 1455-1520 OT Time Calculation (min): 25 min Charges:  OT General Charges $OT Visit: 1 Procedure OT Evaluation $Initial OT Evaluation Tier I: 1 Procedure OT Treatments $Self Care/Home Management : 8-22 mins G-Codes:    Malka So 09/30/2015, 4:26 PM  [phone]

## 2015-09-30 NOTE — Progress Notes (Signed)
PT Cancellation Note  Patient Details Name: Jonathan [name] MRN: [id number] DOB: [birth year]   Cancelled Treatment:    Reason Eval/Treat Not Completed: Fatigue/lethargy limiting ability to participate; patient just back to bed after going to bathroom on his own and reports lightheadedness nausea.  RN made aware and pt encouraged to call for assist when getting up again.   Reginia Naas 09/30/2015, 1:10 PM  Magda Kiel, [place] 09/30/2015

## 2015-09-30 NOTE — Progress Notes (Signed)
OT Cancellation Note  Patient Details Name: Jonathan [name] MRN: [id number] DOB: [birth year]   Cancelled Treatment:    Reason Eval/Treat Not Completed: Fatigue/lethargy limiting ability to participate. Pt reports "not feeling like a hoot." Politely declined OOB. Will continue to follow.  Malka So 09/30/2015, 3:53 PM  [phone]

## 2015-09-30 NOTE — Progress Notes (Signed)
I spoke with patient for about 30 minutes concerning his stress test report and the plan to move forward with cardiac cath, likely on Tuesday. He asked many questions and was not inclined to proceed with this until he spoke with his family. This was about 11 am.   I got a call from the nurse that his family had arrived and wanted to again discuss the stress test results and cath procedure. I Saw them at 5:30 and spent 45 minutes with them and the patient again answering multiple questions, and explaining the procedure. The patient is now ready to have cardiac cath, but wants to talk to his wife who was not there at the time. Family wishes to discuss this again with interventional cardiologist before proceeding. I will defer to Dr. Warren Lacy for more explanation concerning logistics of the procedure.

## 2015-09-30 NOTE — Progress Notes (Addendum)
[facility] PROGRESS NOTE  Jonathan [name] [id number] DOB: 01-07-1929 DOA: 09/28/2015 PCP: Irven Shelling, MD  Length of stay: 2   Assessment/Plan: Principal Problem:   Sepsis ([place]) Active Problems:   Coronary atherosclerosis of native coronary artery   Essential hypertension, benign   Mixed hyperlipidemia   Fever   SOB (shortness of breath)   Atrial fibrillation with RVR ([place])   Diabetes mellitus without complication ([place])   Hypoxia   Typical atrial flutter (HCC)    History of present illness 80 y.o.male with known history of CAD, CABG 2006, (LIMA to LAD, SVG to PDA, SVG to OM1 and OM2), Hypertension, hyperlipidemia, and diabetes who was admitted with dyspnea and palpitations.  EMS brought patient to ER and noted that he was initally in SVT, treated with adenosine 6 mg and 12 mg. HR slowed to atrial fib. He was treated with IV diltiazem 10 mg in ER for HR of 130 bpm. and he converted to NSR. Vital signs in ER, BP 117/57, HR 109, O2 Sat 87%, febrile at 101.4. Flu swab was negative for influenza.  Labs demonstrated troponin of 0.04; 0.34. WBC 8.8, Hgb 12.2, HCT 35.8; Glucose 209. K+ CXR ateletasis in the lung bases. Mild vascular congestion. He was treated also with insulin, ECASA, Xopenex, and heparin. EKG NSR with no acute ST-T wave changes. We are asked to see patient concerning his atrial arrhythmia, positive troponin and questionable CHF.   Patient states that he began to have problems about 18 months ago with his BP. He states that he was on Cozaar, but by the afternoon his BP would be too low and his HR would be racing. His PCP changed him to losartan and made it 25 mg TID about 5 days ago. He was feeling ok until last night. He awoke extremely dyspneic.     Assessment and plan  Sepsis [facility]): pt is septic with fever, tachycardia, tachypnea on admission. The source of the infection is not clear. He has SOB with oxygen desaturation, pointing to possible  respiratory infection, but CXR/CT chest is negative and patient does not have respiratory symptoms. Influenza PCR negative Patient initiated on empiricbroad-spectrum antibiotics48 hours,blood culture, urine culture negative, DC vancomycin/Zosyn, continue Rocephin, azithromycin for cough, possible acute bronchitis Continue SDU       SOB: Likely secondary to arrhythmia, NSR with MAT,abnormal troponin,- echo, lexiscan myoview abnormal, EF 42%, plan is for cardiac cath tomorrow  CAD: s/p of CABG 10 years ago. No chest pain. But presents with new onset Afib with RVR - continue ASA - cycle CE q6 x3 and repeat her EKG in the am  Abnormal Lexiscan Myoview , cardiac cath tomorrow  Possible new onset Atrial Fibrillation with RVR: CHA2DS2-VASc Score is 4, needs oral anticoagulation if become persistent. Currently pt is in sinus arrhythmia vs. MAT Cardiology recommended anticoagulation after cath, Cardizem drip DC'd, switched to by mouth metoprolol   TSH, Free t4 and t3-within normal limits     Essential hypertension, benign: Of Cardizem gtt, receiving IV Lopressor, PO metoprolol -hold losartan    HLD: Last LDL was not on record, patient is not taking medications currently. -Check FLP  Diet-controlled DM-II: Last A1c not on record. Patient is not taking medications at home. Blood sugar 20 on the map -SSI Hemoglobin A1c 8.1   DVT prophylaxsis heparin  Code Status:      Code Status Orders        Start     Ordered  09/28/15 0609  Full code   Continuous     09/28/15 0609      Family Communication: Discussed in detail with the patient, all imaging results, lab results explained to the patient   Disposition Plan: cardiac cath in the morning      Consultants:  Cardiology  Procedures:  Myoview  Antibiotics: Anti-infectives    Start     Dose/Rate Route Frequency Ordered Stop   09/28/15 1700  vancomycin (VANCOCIN) IVPB 1000 mg/200 mL premix     1,000 mg 200 mL/hr over 60  Minutes Intravenous Every 12 hours 09/28/15 0708     09/28/15 1200  piperacillin-tazobactam (ZOSYN) IVPB 3.375 g     3.375 g 12.5 mL/hr over 240 Minutes Intravenous 3 times per day 09/28/15 0708     09/28/15 0330  piperacillin-tazobactam (ZOSYN) IVPB 3.375 g     3.375 g 100 mL/hr over 30 Minutes Intravenous  Once 09/28/15 0315 09/28/15 0529   09/28/15 0330  vancomycin (VANCOCIN) IVPB 1000 mg/200 mL premix     1,000 mg 200 mL/hr over 60 Minutes Intravenous  Once 09/28/15 0315 09/28/15 0853         HPI/Subjective: Low-grade fever last night, no cough nodules or focal symptoms  Objective: Filed Vitals:   09/29/15 2332 09/30/15 0446 09/30/15 0740 09/30/15 1124  BP: 132/67 132/57 143/76 150/66  Pulse: 71 67 74 86  Temp: 98.7 F (37.1 C) 98.4 F (36.9 C) 98 F (36.7 C) 98.3 F (36.8 C)  TempSrc: Oral Oral Oral Oral  Resp: 21 18 18 18   Height:      Weight:      SpO2: 98% 100% 98% 95%    Intake/Output Summary (Last 24 hours) at 09/30/15 1209 Last data filed at 09/30/15 1100  Gross per 24 hour  Intake   1030 ml  Output   1400 ml  Net   -370 ml    Exam:  General: No acute respiratory distress Lungs: Clear to auscultation bilaterally without wheezes or crackles Cardiovascular: Regular rate and rhythm without murmur gallop or rub normal S1 and S2 Abdomen: Nontender, nondistended, soft, bowel sounds positive, no rebound, no ascites, no appreciable mass Extremities: No significant cyanosis, clubbing, or edema bilateral lower extremities     Data Review   Micro Results Recent Results (from the past 240 hour(s))  Blood Culture (routine x 2)     Status: None (Preliminary result)   Collection Time: 09/28/15  3:38 AM  Result Value Ref Range Status   Specimen Description BLOOD RIGHT ARM  Final   Special Requests BOTTLES DRAWN AEROBIC AND ANAEROBIC 10ML  Final   Culture NO GROWTH 1 DAY  Final   Report Status PENDING  Incomplete  Blood Culture (routine x 2)     Status:  None (Preliminary result)   Collection Time: 09/28/15  3:43 AM  Result Value Ref Range Status   Specimen Description BLOOD LEFT ARM  Final   Special Requests BOTTLES DRAWN AEROBIC AND ANAEROBIC 10ML  Final   Culture NO GROWTH 1 DAY  Final   Report Status PENDING  Incomplete  Urine culture     Status: None   Collection Time: 09/28/15  5:51 AM  Result Value Ref Range Status   Specimen Description URINE, RANDOM  Final   Special Requests NONE  Final   Culture NO GROWTH 1 DAY  Final   Report Status 09/29/2015 FINAL  Final  MRSA PCR Screening     Status: None   Collection Time:  09/28/15  1:36 PM  Result Value Ref Range Status   MRSA by PCR NEGATIVE NEGATIVE Final    Comment:        The GeneXpert MRSA Assay (FDA approved for NASAL specimens only), is one component of a comprehensive MRSA colonization surveillance program. It is not intended to diagnose MRSA infection nor to guide or monitor treatment for MRSA infections.     Radiology Reports Dg Chest 2 View  09/28/2015  CLINICAL DATA:  Fever, shortness of breath, and cough for 2 days. EXAM: CHEST  2 VIEW COMPARISON:  04/20/2005 FINDINGS: Postoperative changes in the mediastinum. Shallow inspiration with atelectasis in the lung bases. No focal consolidation. Mild cardiac enlargement. Mild vascular congestion. No edema. No blunting of costophrenic angles. No pneumothorax. Calcified and tortuous aorta. Degenerative changes in the spine and shoulders. IMPRESSION: Shallow inspiration with atelectasis in the lung bases. Cardiac enlargement with mild vascular congestion. No focal consolidation or edema. Electronically Signed   By: Lucienne Capers M.D.   On: 09/28/2015 03:37   Ct Chest Wo Contrast  09/28/2015  CLINICAL DATA:  Shortness of breath. Past medical history of hypertension, coronary artery disease, palpitations, hyperlipidemia. EXAM: CT CHEST WITHOUT CONTRAST TECHNIQUE: Multidetector CT imaging of the chest was performed following  the standard protocol without IV contrast. COMPARISON:  None. FINDINGS: Heart size is upper normal. No pericardial effusion. Coronary artery calcifications noted. Patient is status post CABG. Scattered atherosclerotic changes noted along the walls of the normal-caliber thoracic aorta. Scattered small lymph nodes within the mediastinum. No mass or enlarged lymph nodes identified within the mediastinum or perihilar regions. Mild scarring/atelectasis noted at each lung base. Trace pleural effusions versus pleural thickening at each lung base. Lungs otherwise clear. No evidence of pneumonia. No evidence of congestive heart failure. No pneumothorax. Trachea and central bronchi are unremarkable. Stone within the gallbladder measures at least 1.8 cm diameter. Limited images of the upper abdomen are otherwise unremarkable. Mild degenerative change noted throughout the thoracic spine. No acute appearing osseous abnormality IMPRESSION: 1. No evidence of acute intrathoracic abnormality. No evidence of pneumonia. No significant pleural effusion. Heart size is upper normal. No evidence of congestive heart failure. 2. Mild atelectasis/scarring at each lung base. Trace pleural effusions, versus more likely chronic pleural thickening, at each lung base. 3. Cholelithiasis, incompletely imaged, without obvious evidence of acute cholecystitis. Electronically Signed   By: Franki Cabot M.D.   On: 09/28/2015 22:49   Nm Myocar Multi W/spect W/wall Motion / Ef  09/29/2015  CLINICAL DATA:  Shortness of breath beginning Friday and Saturday, history diabetes mellitus, hypertension, hyperlipidemia, coronary artery disease post CABG EXAM: MYOCARDIAL IMAGING WITH SPECT (REST AND PHARMACOLOGIC-STRESS) GATED LEFT VENTRICULAR WALL MOTION STUDY LEFT VENTRICULAR EJECTION FRACTION TECHNIQUE: Standard myocardial SPECT imaging was performed after resting intravenous injection of 10 mCi Tc-19m sestamibi. Subsequently, intravenous infusion of  Lexiscan was performed under the supervision of the Cardiology staff. At peak effect of the drug, 30 mCi Tc-51m sestamibi was injected intravenously and standard myocardial SPECT imaging was performed. Quantitative gated imaging was also performed to evaluate left ventricular wall motion, and estimate left ventricular ejection fraction. COMPARISON:  None. FINDINGS: Perfusion: Moderate-sized area of decreased myocardial perfusion at the lateral wall of the LEFT ventricle on image its obtained following pharmacologic stress. Reperfusion of this zone on resting images. No pulmonary uptake of tracer. Wall Motion: Normal left ventricular wall motion. No left ventricular dilation. Left Ventricular Ejection Fraction: 42 % End diastolic volume 87 ml End systolic volume  51 ml IMPRESSION: 1. Lexiscan-induced reversible decreased myocardial perfusion of the lateral wall of the LEFT ventricle. 2. Normal left ventricular wall motion. 3. Left ventricular ejection fraction 42% 4. Intermediate-risk stress test findings*. *2012 Appropriate Use Criteria for Coronary Revascularization Focused Update: J Am Coll Cardiol. [id number]. [email or web address]?[email or web address] Electronically Signed   By: Lavonia Dana M.D.   On: 09/29/2015 12:44     CBC  Recent Labs Lab 09/28/15 0343 09/29/15 0337 09/30/15 0445  WBC 8.8 6.0 5.7  HGB 12.2* 10.4* 11.0*  HCT 35.8* 30.6* 32.1*  PLT 203 190 193  MCV 82.5 82.9 81.9  MCH 28.1 28.2 28.1  MCHC 34.1 34.0 34.3  RDW 13.6 13.8 13.5  LYMPHSABS 0.5*  --   --   MONOABS 1.1*  --   --   EOSABS 0.0  --   --   BASOSABS 0.0  --   --     Chemistries   Recent Labs Lab 09/28/15 0343 09/29/15 0337 09/30/15 0445  NA 136 136 138  K 4.2 4.1 3.8  CL 106 105 104  CO2 22 26 26   GLUCOSE 209* 134* 147*  BUN 21* 17 14  CREATININE 0.86 0.94 0.95  CALCIUM 8.0* 7.8* 7.9*  MG 1.8  --   --   AST 16 19 21   ALT 14* 18 19  ALKPHOS 59 51 51  BILITOT 0.6 0.8 0.8    ------------------------------------------------------------------------------------------------------------------ estimated creatinine clearance is 58.7 mL/min (by C-G formula based on Cr of 0.95). ------------------------------------------------------------------------------------------------------------------  Recent Labs  09/28/15 0615  HGBA1C 8.1*   ------------------------------------------------------------------------------------------------------------------  Recent Labs  09/29/15 0337  CHOL 154  HDL 19*  LDLCALC 112*  TRIG 116  CHOLHDL 8.1   ------------------------------------------------------------------------------------------------------------------  Recent Labs  09/28/15 0615 09/28/15 1832  TSH  --  2.004  T3FREE 2.1  --    ------------------------------------------------------------------------------------------------------------------ No results for input(s): VITAMINB12, FOLATE, FERRITIN, TIBC, IRON, RETICCTPCT in the last 72 hours.  Coagulation profile  Recent Labs Lab 09/28/15 0615  INR 1.21    No results for input(s): DDIMER in the last 72 hours.  Cardiac Enzymes  Recent Labs Lab 09/28/15 1832 09/28/15 2140 09/29/15 0337  TROPONINI 0.42* 0.37* 0.26*   ------------------------------------------------------------------------------------------------------------------ Invalid input(s): POCBNP   CBG:  Recent Labs Lab 09/29/15 0803 09/29/15 1220 09/29/15 1650 09/29/15 2112 09/30/15 0742  GLUCAP 125* 156* 171* 178* 148*       Studies: Ct Chest Wo Contrast  09/28/2015  CLINICAL DATA:  Shortness of breath. Past medical history of hypertension, coronary artery disease, palpitations, hyperlipidemia. EXAM: CT CHEST WITHOUT CONTRAST TECHNIQUE: Multidetector CT imaging of the chest was performed following the standard protocol without IV contrast. COMPARISON:  None. FINDINGS: Heart size is upper normal. No pericardial effusion.  Coronary artery calcifications noted. Patient is status post CABG. Scattered atherosclerotic changes noted along the walls of the normal-caliber thoracic aorta. Scattered small lymph nodes within the mediastinum. No mass or enlarged lymph nodes identified within the mediastinum or perihilar regions. Mild scarring/atelectasis noted at each lung base. Trace pleural effusions versus pleural thickening at each lung base. Lungs otherwise clear. No evidence of pneumonia. No evidence of congestive heart failure. No pneumothorax. Trachea and central bronchi are unremarkable. Stone within the gallbladder measures at least 1.8 cm diameter. Limited images of the upper abdomen are otherwise unremarkable. Mild degenerative change noted throughout the thoracic spine. No acute appearing osseous abnormality IMPRESSION: 1. No evidence of acute intrathoracic abnormality. No evidence of pneumonia. No significant pleural effusion. Heart size is upper normal.  No evidence of congestive heart failure. 2. Mild atelectasis/scarring at each lung base. Trace pleural effusions, versus more likely chronic pleural thickening, at each lung base. 3. Cholelithiasis, incompletely imaged, without obvious evidence of acute cholecystitis. Electronically Signed   By: Franki Cabot M.D.   On: 09/28/2015 22:49   Nm Myocar Multi W/spect W/wall Motion / Ef  09/29/2015  CLINICAL DATA:  Shortness of breath beginning Friday and Saturday, history diabetes mellitus, hypertension, hyperlipidemia, coronary artery disease post CABG EXAM: MYOCARDIAL IMAGING WITH SPECT (REST AND PHARMACOLOGIC-STRESS) GATED LEFT VENTRICULAR WALL MOTION STUDY LEFT VENTRICULAR EJECTION FRACTION TECHNIQUE: Standard myocardial SPECT imaging was performed after resting intravenous injection of 10 mCi Tc-88m sestamibi. Subsequently, intravenous infusion of Lexiscan was performed under the supervision of the Cardiology staff. At peak effect of the drug, 30 mCi Tc-51m sestamibi was  injected intravenously and standard myocardial SPECT imaging was performed. Quantitative gated imaging was also performed to evaluate left ventricular wall motion, and estimate left ventricular ejection fraction. COMPARISON:  None. FINDINGS: Perfusion: Moderate-sized area of decreased myocardial perfusion at the lateral wall of the LEFT ventricle on image its obtained following pharmacologic stress. Reperfusion of this zone on resting images. No pulmonary uptake of tracer. Wall Motion: Normal left ventricular wall motion. No left ventricular dilation. Left Ventricular Ejection Fraction: 42 % End diastolic volume 87 ml End systolic volume 51 ml IMPRESSION: 1. Lexiscan-induced reversible decreased myocardial perfusion of the lateral wall of the LEFT ventricle. 2. Normal left ventricular wall motion. 3. Left ventricular ejection fraction 42% 4. Intermediate-risk stress test findings*. *2012 Appropriate Use Criteria for Coronary Revascularization Focused Update: J Am Coll Cardiol. [id number]. http://[email or web address]?[email or web address] Electronically Signed   By: Lavonia Dana M.D.   On: 09/29/2015 12:44      Lab Results  Component Value Date   HGBA1C 8.1* 09/28/2015   Lab Results  Component Value Date   LDLCALC 112* 09/29/2015   CREATININE 0.95 09/30/2015       Scheduled Meds: . aspirin EC  81 mg Oral Daily  . heparin  5,000 Units Subcutaneous 3 times per day  . insulin aspart  0-9 Units Subcutaneous TID WC  . metoprolol tartrate  25 mg Oral BID  . piperacillin-tazobactam (ZOSYN)  IV  3.375 g Intravenous 3 times per day  . sodium chloride  3 mL Intravenous Q12H  . vancomycin  1,000 mg Intravenous Q12H   Continuous Infusions:   Principal Problem:   Sepsis ([place]) Active Problems:   Coronary atherosclerosis of native coronary artery   Essential hypertension, benign   Mixed hyperlipidemia   Fever   SOB (shortness of breath)   Atrial fibrillation with RVR ([place])    Diabetes mellitus without complication (HCC)   Hypoxia   Typical atrial flutter ([place])    Time spent: 45 minutes   [facility] Pager [phone]. If 7PM-7AM, please contact night-coverage at [email or web address], password [email or web address] 09/30/2015, 12:09 PM  LOS: 2 days

## 2015-09-30 NOTE — Progress Notes (Addendum)
Dr. Irish Lack Subjective:   Transient fever over night. No CP, no SOB. Mild cough, sputum.   Objective:  Vital Signs in the last 24 hours: Temp:  [98 F (36.7 C)-102.2 F (39 C)] 98 F (36.7 C) (12/19 0740) Pulse Rate:  [67-98] 74 (12/19 0740) Resp:  [18-28] 18 (12/19 0740) BP: (114-148)/(51-76) 143/76 mmHg (12/19 0740) SpO2:  [89 %-100 %] 98 % (12/19 0740)  Intake/Output from previous day: 12/18 0701 - 12/19 0700 In: 960 [P.O.:360; IV Piggyback:600] Out: 1300 [Urine:1300]   Physical Exam: General: Well developed,elderly, in no acute distress. Head:  Normocephalic and atraumatic. Lungs: Clear to auscultation and percussion. Heart: Normal S1 and S2.  No murmur, rubs or gallops.  Abdomen: soft, non-tender, positive bowel sounds. Extremities: No clubbing or cyanosis. No edema. Neurologic: Alert and oriented x 3.    Lab Results:  Recent Labs  09/29/15 0337 09/30/15 0445  WBC 6.0 5.7  HGB 10.4* 11.0*  PLT 190 193    Recent Labs  09/29/15 0337 09/30/15 0445  NA 136 138  K 4.1 3.8  CL 105 104  CO2 26 26  GLUCOSE 134* 147*  BUN 17 14  CREATININE 0.94 0.95    Recent Labs  09/28/15 2140 09/29/15 0337  TROPONINI 0.37* 0.26*   Hepatic Function Panel  Recent Labs  09/28/15 0343  09/30/15 0445  PROT 5.7*  < > 5.5*  ALBUMIN 3.0*  < > 2.8*  AST 16  < > 21  ALT 14*  < > 19  ALKPHOS 59  < > 51  BILITOT 0.6  < > 0.8  BILIDIR 0.1  --   --   IBILI 0.5  --   --   < > = values in this interval not displayed.  Recent Labs  09/29/15 0337  CHOL 154   No results for input(s): PROTIME in the last 72 hours.  Imaging: Ct Chest Wo Contrast  09/28/2015  CLINICAL DATA:  Shortness of breath. Past medical history of hypertension, coronary artery disease, palpitations, hyperlipidemia. EXAM: CT CHEST WITHOUT CONTRAST TECHNIQUE: Multidetector CT imaging of the chest was performed following the standard protocol without IV contrast. COMPARISON:  None. FINDINGS:  Heart size is upper normal. No pericardial effusion. Coronary artery calcifications noted. Patient is status post CABG. Scattered atherosclerotic changes noted along the walls of the normal-caliber thoracic aorta. Scattered small lymph nodes within the mediastinum. No mass or enlarged lymph nodes identified within the mediastinum or perihilar regions. Mild scarring/atelectasis noted at each lung base. Trace pleural effusions versus pleural thickening at each lung base. Lungs otherwise clear. No evidence of pneumonia. No evidence of congestive heart failure. No pneumothorax. Trachea and central bronchi are unremarkable. Stone within the gallbladder measures at least 1.8 cm diameter. Limited images of the upper abdomen are otherwise unremarkable. Mild degenerative change noted throughout the thoracic spine. No acute appearing osseous abnormality IMPRESSION: 1. No evidence of acute intrathoracic abnormality. No evidence of pneumonia. No significant pleural effusion. Heart size is upper normal. No evidence of congestive heart failure. 2. Mild atelectasis/scarring at each lung base. Trace pleural effusions, versus more likely chronic pleural thickening, at each lung base. 3. Cholelithiasis, incompletely imaged, without obvious evidence of acute cholecystitis. Electronically Signed   By: Franki Cabot M.D.   On: 09/28/2015 22:49   Nm Myocar Multi W/spect W/wall Motion / Ef  09/29/2015  CLINICAL DATA:  Shortness of breath beginning Friday and Saturday, history diabetes mellitus, hypertension, hyperlipidemia, coronary artery disease post CABG EXAM: MYOCARDIAL  IMAGING WITH SPECT (REST AND PHARMACOLOGIC-STRESS) GATED LEFT VENTRICULAR WALL MOTION STUDY LEFT VENTRICULAR EJECTION FRACTION TECHNIQUE: Standard myocardial SPECT imaging was performed after resting intravenous injection of 10 mCi Tc-78m sestamibi. Subsequently, intravenous infusion of Lexiscan was performed under the supervision of the Cardiology staff. At peak  effect of the drug, 30 mCi Tc-77m sestamibi was injected intravenously and standard myocardial SPECT imaging was performed. Quantitative gated imaging was also performed to evaluate left ventricular wall motion, and estimate left ventricular ejection fraction. COMPARISON:  None. FINDINGS: Perfusion: Moderate-sized area of decreased myocardial perfusion at the lateral wall of the LEFT ventricle on image its obtained following pharmacologic stress. Reperfusion of this zone on resting images. No pulmonary uptake of tracer. Wall Motion: Normal left ventricular wall motion. No left ventricular dilation. Left Ventricular Ejection Fraction: 42 % End diastolic volume 87 ml End systolic volume 51 ml IMPRESSION: 1. Lexiscan-induced reversible decreased myocardial perfusion of the lateral wall of the LEFT ventricle. 2. Normal left ventricular wall motion. 3. Left ventricular ejection fraction 42% 4. Intermediate-risk stress test findings*. *2012 Appropriate Use Criteria for Coronary Revascularization Focused Update: J Am Coll Cardiol. [id number]. http://[email or web address]?[email or web address] Electronically Signed   By: Lavonia Dana M.D.   On: 09/29/2015 12:44  Personally viewed   Telemetry: SVT episodes (PAT at times), now NSR Personally viewed.   EKG:   NSR 79 Personally viewed.  Cardiac Studies:  NUC - lateral ischemia. EF 42%  Meds: Scheduled Meds: . aspirin EC  81 mg Oral Daily  . heparin  5,000 Units Subcutaneous 3 times per day  . insulin aspart  0-9 Units Subcutaneous TID WC  . metoprolol tartrate  12.5 mg Oral BID  . piperacillin-tazobactam (ZOSYN)  IV  3.375 g Intravenous 3 times per day  . sodium chloride  3 mL Intravenous Q12H  . vancomycin  1,000 mg Intravenous Q12H   Continuous Infusions:  PRN Meds:.acetaminophen **OR** acetaminophen, ALPRAZolam, dextromethorphan-guaiFENesin, levalbuterol  Assessment/Plan:  Principal Problem:   Sepsis ([place]) Active Problems:    Coronary atherosclerosis of native coronary artery   Essential hypertension, benign   Mixed hyperlipidemia   Fever   SOB (shortness of breath)   Atrial fibrillation with RVR (HCC)   Diabetes mellitus without complication (HCC)   Hypoxia   Typical atrial flutter (HCC)  Demand ischemia  - Trop low level elevation  - Stress test abnormal, lateral ischemia  - EF 42% on NUC  - Plan for cath tomorrow, (as long as fever not an issue)  - 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   Fever  - recent 102, now AF  - Zosyn per primary team  - no PNA on CT scan.   CAD  - post CABG ten years ago  - 2006, (LIMA to LAD, SVG to PDA, SVG to OM1 and OM2)  - 9am Dr. Ellyn Hack for 10/01/15, Tues  Dyspnea  - improved (main complaint coming in)  - EF on ECHO 70%, on NUC 42%.   Essential hypertension  - off ARB   SKAINS, MARK 09/30/2015, 9:51 AM

## 2015-10-01 ENCOUNTER — Inpatient Hospital Stay (HOSPITAL_COMMUNITY): Payer: Medicare Other

## 2015-10-01 ENCOUNTER — Encounter (HOSPITAL_COMMUNITY)
Admission: EM | Disposition: A | Discharge status: Home / Self Care | Payer: Self-pay | Source: Home / Self Care | Attending: Internal Medicine

## 2015-10-01 ENCOUNTER — Encounter (HOSPITAL_COMMUNITY): Payer: Self-pay | Admitting: Cardiology

## 2015-10-01 DIAGNOSIS — Z951 Presence of aortocoronary bypass graft: Secondary | ICD-10-CM

## 2015-10-01 DIAGNOSIS — R931 Abnormal findings on diagnostic imaging of heart and coronary circulation: Secondary | ICD-10-CM

## 2015-10-01 HISTORY — PX: CARDIAC CATHETERIZATION: SHX172

## 2015-10-01 LAB — BASIC METABOLIC PANEL
Anion gap: 9 (ref 5–15)
BUN: 15 mg/dL (ref 6–20)
CALCIUM: 8.3 mg/dL — AB (ref 8.9–10.3)
CO2: 24 mmol/L (ref 22–32)
CREATININE: 0.82 mg/dL (ref 0.61–1.24)
Chloride: 105 mmol/L (ref 101–111)
GFR calc non Af Amer: >60 mL/min (ref >60)
GLUCOSE: 124 mg/dL — AB (ref 65–99)
Potassium: 3.6 mmol/L (ref 3.5–5.1)
Sodium: 138 mmol/L (ref 135–145)

## 2015-10-01 LAB — CBC
HCT: 36.9 % — ABNORMAL LOW (ref 39.0–52.0)
HEMATOCRIT: 32.3 % — AB (ref 39.0–52.0)
Hemoglobin: 11 g/dL — ABNORMAL LOW (ref 13.0–17.0)
Hemoglobin: 12.3 g/dL — ABNORMAL LOW (ref 13.0–17.0)
MCH: 27.8 pg (ref 26.0–34.0)
MCH: 27.2 pg (ref 26.0–34.0)
MCHC: 34.1 g/dL (ref 30.0–36.0)
MCHC: 33.3 g/dL (ref 30.0–36.0)
MCV: 81.6 fL (ref 78.0–100.0)
MCV: 81.6 fL (ref 78.0–100.0)
PLATELETS: 247 10*3/uL (ref 150–400)
Platelets: 217 10*3/uL (ref 150–400)
RBC: 3.96 MIL/uL — ABNORMAL LOW (ref 4.22–5.81)
RBC: 4.52 MIL/uL (ref 4.22–5.81)
RDW: 13.5 % (ref 11.5–15.5)
RDW: 13.3 % (ref 11.5–15.5)
WBC: 7 10*3/uL (ref 4.0–10.5)
WBC: 8 10*3/uL (ref 4.0–10.5)

## 2015-10-01 LAB — CREATININE, SERUM
CREATININE: 0.84 mg/dL (ref 0.61–1.24)
GFR calc Af Amer: >60 mL/min (ref >60)

## 2015-10-01 LAB — GLUCOSE, CAPILLARY
GLUCOSE-CAPILLARY: 115 mg/dL — AB (ref 65–99)
GLUCOSE-CAPILLARY: 128 mg/dL — AB (ref 65–99)
Glucose-Capillary: 109 mg/dL — ABNORMAL HIGH (ref 65–99)
Glucose-Capillary: 143 mg/dL — ABNORMAL HIGH (ref 65–99)

## 2015-10-01 LAB — C-REACTIVE PROTEIN: CRP: 10.4 mg/dL — ABNORMAL HIGH (ref <1.0)

## 2015-10-01 LAB — PROTIME-INR
INR: 1.23 (ref 0.00–1.49)
Prothrombin Time: 15.6 seconds — ABNORMAL HIGH (ref 11.6–15.2)

## 2015-10-01 LAB — SEDIMENTATION RATE: Sed Rate: 48 mm/hr — ABNORMAL HIGH (ref 0–16)

## 2015-10-01 LAB — LACTATE DEHYDROGENASE: LDH: 226 U/L — ABNORMAL HIGH (ref 98–192)

## 2015-10-01 SURGERY — LEFT HEART CATH AND CORS/GRAFTS ANGIOGRAPHY

## 2015-10-01 MED ORDER — LIDOCAINE HCL (PF) 1 % IJ SOLN
INTRAMUSCULAR | Status: AC
Start: 2015-10-01 — End: 2015-10-01
  Filled 2015-10-01: qty 30

## 2015-10-01 MED ORDER — SODIUM CHLORIDE 0.9 % WEIGHT BASED INFUSION
1.0000 mL/kg/h | INTRAVENOUS | Status: AC
  Administered 2015-10-01: 1 mL/kg/h via INTRAVENOUS

## 2015-10-01 MED ORDER — IOHEXOL 350 MG/ML SOLN
INTRAVENOUS | Status: DC | PRN
  Administered 2015-10-01: 120 mL via INTRAVENOUS

## 2015-10-01 MED ORDER — FENTANYL CITRATE (PF) 100 MCG/2ML IJ SOLN
INTRAMUSCULAR | Status: DC | PRN
  Administered 2015-10-01: 25 ug via INTRAVENOUS

## 2015-10-01 MED ORDER — MIDAZOLAM HCL 2 MG/2ML IJ SOLN
INTRAMUSCULAR | Status: DC | PRN
  Administered 2015-10-01: 1 mg via INTRAVENOUS

## 2015-10-01 MED ORDER — HEPARIN SODIUM (PORCINE) 5000 UNIT/ML IJ SOLN
5000.0000 [IU] | Freq: Three times a day (TID) | INTRAMUSCULAR | Status: DC
  Filled 2015-10-01 (×2): qty 1

## 2015-10-01 MED ORDER — HEPARIN SODIUM (PORCINE) 1000 UNIT/ML IJ SOLN
INTRAMUSCULAR | Status: DC | PRN
  Administered 2015-10-01: 4000 [IU] via INTRAVENOUS

## 2015-10-01 MED ORDER — HEPARIN SODIUM (PORCINE) 1000 UNIT/ML IJ SOLN
INTRAMUSCULAR | Status: AC
  Filled 2015-10-01: qty 1

## 2015-10-01 MED ORDER — FENTANYL CITRATE (PF) 100 MCG/2ML IJ SOLN
INTRAMUSCULAR | Status: AC
  Filled 2015-10-01: qty 2

## 2015-10-01 MED ORDER — MIDAZOLAM HCL 2 MG/2ML IJ SOLN
INTRAMUSCULAR | Status: AC
  Filled 2015-10-01: qty 2

## 2015-10-01 MED ORDER — VERAPAMIL HCL 2.5 MG/ML IV SOLN
INTRAVENOUS | Status: AC
Start: 2015-10-01 — End: 2015-10-01
  Filled 2015-10-01: qty 2

## 2015-10-01 MED ORDER — LIDOCAINE HCL (PF) 1 % IJ SOLN
INTRAMUSCULAR | Status: DC | PRN
  Administered 2015-10-01: 5 mL

## 2015-10-01 MED ORDER — VERAPAMIL HCL 2.5 MG/ML IV SOLN
INTRAVENOUS | Status: DC | PRN
  Administered 2015-10-01: 10 mL via INTRA_ARTERIAL

## 2015-10-01 MED ORDER — HEPARIN (PORCINE) IN NACL 2-0.9 UNIT/ML-% IJ SOLN
INTRAMUSCULAR | Status: AC
  Filled 2015-10-01: qty 1500

## 2015-10-01 MED ORDER — HEPARIN (PORCINE) IN NACL 2-0.9 UNIT/ML-% IJ SOLN
INTRAMUSCULAR | Status: DC | PRN
  Administered 2015-10-01: 11:00:00

## 2015-10-01 MED ORDER — SODIUM CHLORIDE 0.9 % IJ SOLN: 3.0000 mL | INTRAMUSCULAR | Status: DC | PRN

## 2015-10-01 MED ORDER — SODIUM CHLORIDE 0.9 % IV SOLN: 250.0000 mL | INTRAVENOUS | Status: DC | PRN

## 2015-10-01 MED ORDER — SODIUM CHLORIDE 0.9 % IJ SOLN
3.0000 mL | Freq: Two times a day (BID) | INTRAMUSCULAR | Status: DC
  Administered 2015-10-01 – 2015-10-04 (×5): 3 mL via INTRAVENOUS

## 2015-10-01 MED ORDER — METOPROLOL TARTRATE 50 MG PO TABS
50.0000 mg | ORAL_TABLET | Freq: Two times a day (BID) | ORAL | Status: DC
  Administered 2015-10-01 – 2015-10-02 (×2): 50 mg via ORAL
  Filled 2015-10-01 (×2): qty 1

## 2015-10-01 SURGICAL SUPPLY — 10 items
CATH INFINITI 5 FR IM (CATHETERS) ×1 IMPLANT
CATH INFINITI 5FR MULTPACK ANG (CATHETERS) ×1 IMPLANT
DEVICE RAD COMP TR BAND LRG (VASCULAR PRODUCTS) ×2 IMPLANT
GLIDESHEATH SLEND SS 6F .021 (SHEATH) ×2 IMPLANT
KIT HEART LEFT (KITS) ×2 IMPLANT
PACK CARDIAC CATHETERIZATION (CUSTOM PROCEDURE TRAY) ×2 IMPLANT
SYR MEDRAD MARK V 150ML (SYRINGE) ×2 IMPLANT
TRANSDUCER W/STOPCOCK (MISCELLANEOUS) ×2 IMPLANT
TUBING CIL FLEX 10 FLL-RA (TUBING) ×2 IMPLANT
WIRE SAFE-T 1.5MM-J .035X260CM (WIRE) ×2 IMPLANT

## 2015-10-01 NOTE — Progress Notes (Signed)
Report called to Ryan on unit 5W. Patient transferred via bed to 5W17. Attempted to contact family wife @ [phone] with no answer, son [name] at [phone] with no answer, message left for son [name].

## 2015-10-01 NOTE — Evaluation (Signed)
Physical Therapy Evaluation Patient Details Name: Jonathan [name] MRN: [id number] DOB: 20-Sep-1929 Today's Date: 10/01/2015   History of Present Illness  Pt admitted with sepsis, SOB, possible new onset afib with RVR.  PMH: HTN, HLD, DM, CAD s/p CABG, back pain, BPH.  Clinical Impression  Patient presents with mild balance deficits, decreased safety awareness and some decreased activity tolerance and will benefit from skilled PT in the acute setting to ensure safety and allow d/c home with intermittent family support.  Likely no follow up needs, but will perform formal balance assessment following cath to ensure minimal fall risk.    Follow Up Recommendations No PT follow up    Equipment Recommendations  Other (comment) (TBA after formal balance assessment)    Recommendations for Other Services       Precautions / Restrictions Precautions Precautions: Fall      Mobility  Bed Mobility Overal bed mobility: Modified Independent                Transfers     Transfers: Sit to/from Stand Sit to Stand: Min guard         General transfer comment: for balance/safety due to bed higher at edge  Ambulation/Gait Ambulation/Gait assistance: Supervision;Min guard Ambulation Distance (Feet): 250 Feet Assistive device: None Gait Pattern/deviations: Step-through pattern;Wide base of support;Decreased stride length     General Gait Details: increased lateral weight shift, but no LOB. Did head turns/nods (reports occasionally woozy with head nods but felt due to Losartan), and turn and stop without increased variance from straight path or LOB  Stairs            Wheelchair Mobility    Modified Rankin (Stroke Patients Only)       Balance Overall balance assessment: Needs assistance           Standing balance-Leahy Scale: Good Standing balance comment: mild deficits evident with initial standing, but no LOB                             Pertinent  Vitals/Pain Pain Assessment: No/denies pain    Home Living Family/patient expects to be discharged to:: Private residence Living Arrangements: Spouse/significant other Available Help at Discharge: Family;Available 24 hours/day Type of Home: House Home Access: Stairs to enter Entrance Stairs-Rails: Right Entrance Stairs-Number of Steps: 1 Home Layout: One level Home Equipment: None      Prior Function Level of Independence: Independent         Comments: family in room to confirm info     Hand Dominance   Dominant Hand: Right    Extremity/Trunk Assessment   Upper Extremity Assessment:  (noted R with Dupytren's contracture, reports recent surgery for L hand)           Lower Extremity Assessment: Overall WFL for tasks assessed         Communication   Communication: No difficulties  Cognition Arousal/Alertness: Awake/alert Behavior During Therapy: Impulsive   Area of Impairment: Memory;Safety/judgement     Memory: Decreased short-term memory   Safety/Judgement: Decreased awareness of safety          General Comments      Exercises        Assessment/Plan    PT Assessment Patient needs continued PT services  PT Diagnosis Abnormality of gait   PT Problem List Decreased balance;Decreased mobility;Decreased safety awareness;Decreased cognition;Decreased activity tolerance  PT Treatment Interventions Balance training;Stair training;Cognitive remediation;Gait training;Functional mobility training;Patient/family education;Therapeutic  activities;Therapeutic exercise   PT Goals (Current goals can be found in the Care Plan section) Acute Rehab PT Goals Patient Stated Goal: go home after successful cath procedure PT Goal Formulation: With patient/family Time For Goal Achievement: 10/08/15 Potential to Achieve Goals: Good    Frequency Min 3X/week   Barriers to discharge Decreased caregiver support      Co-evaluation               End of  Session Equipment Utilized During Treatment: Gait belt Activity Tolerance: Patient tolerated treatment well Patient left: in bed;with call bell/phone within reach;with family/visitor present           Time: 0845-0900 PT Time Calculation (min) (ACUTE ONLY): 15 min   Charges:   PT Evaluation $Initial PT Evaluation Tier I: 1 Procedure     PT G CodesReginia Naas 19-Oct-2015, 9:12 AM  Magda Kiel, PT [phone] 10/19/15

## 2015-10-01 NOTE — H&P (View-Only) (Signed)
Dr. Irish Lack Subjective:   Transient temp overnight again. No source. IM team following.  No CP, no SOB. Mild cough, sputum. ?bronchitis. [place] to cath lab  Objective:  Vital Signs in the last 24 hours: Temp:  [97.9 F (36.6 C)-101.1 F (38.4 C)] 98 F (36.7 C) (12/20 0721) Pulse Rate:  [70-86] 70 (12/19 2153) Resp:  [16-20] 18 (12/20 0721) BP: (119-171)/(64-87) 171/87 mmHg (12/20 0721) SpO2:  [92 %-98 %] 92 % (12/20 0721) Weight:  [173 lb 11.6 oz (78.8 kg)] 173 lb 11.6 oz (78.8 kg) (12/20 0600)  Intake/Output from previous day: 12/19 0701 - 12/20 0700 In: 1012.1 [P.O.:120; I.V.:842.1; IV Piggyback:50] Out: [id number] [Urine:575]   Physical Exam: General: Well developed,elderly, in no acute distress. Head:  Normocephalic and atraumatic. Lungs: Clear to auscultation and percussion. Heart: Normal S1 and S2.  No murmur, rubs or gallops.  Abdomen: soft, non-tender, positive bowel sounds. Extremities: No clubbing or cyanosis. No edema. Neurologic: Alert and oriented x 3.    Lab Results:  Recent Labs  09/30/15 0445 10/01/15 0505  WBC 5.7 7.0  HGB 11.0* 11.0*  PLT 193 217    Recent Labs  09/30/15 0445 10/01/15 0505  NA 138 138  K 3.8 3.6  CL 104 105  CO2 26 24  GLUCOSE 147* 124*  BUN 14 15  CREATININE 0.95 0.82    Recent Labs  09/28/15 2140 09/29/15 0337  TROPONINI 0.37* 0.26*   Hepatic Function Panel  Recent Labs  09/30/15 0445  PROT 5.5*  ALBUMIN 2.8*  AST 21  ALT 19  ALKPHOS 51  BILITOT 0.8    Recent Labs  09/29/15 0337  CHOL 154   No results for input(s): PROTIME in the last 72 hours.  Imaging: Nm Myocar Multi W/spect W/wall Motion / Ef  09/29/2015  CLINICAL DATA:  Shortness of breath beginning Friday and Saturday, history diabetes mellitus, hypertension, hyperlipidemia, coronary artery disease post CABG EXAM: MYOCARDIAL IMAGING WITH SPECT (REST AND PHARMACOLOGIC-STRESS) GATED LEFT VENTRICULAR WALL MOTION STUDY LEFT VENTRICULAR  EJECTION FRACTION TECHNIQUE: Standard myocardial SPECT imaging was performed after resting intravenous injection of 10 mCi Tc-72m sestamibi. Subsequently, intravenous infusion of Lexiscan was performed under the supervision of the Cardiology staff. At peak effect of the drug, 30 mCi Tc-78m sestamibi was injected intravenously and standard myocardial SPECT imaging was performed. Quantitative gated imaging was also performed to evaluate left ventricular wall motion, and estimate left ventricular ejection fraction. COMPARISON:  None. FINDINGS: Perfusion: Moderate-sized area of decreased myocardial perfusion at the lateral wall of the LEFT ventricle on image its obtained following pharmacologic stress. Reperfusion of this zone on resting images. No pulmonary uptake of tracer. Wall Motion: Normal left ventricular wall motion. No left ventricular dilation. Left Ventricular Ejection Fraction: 42 % End diastolic volume 87 ml End systolic volume 51 ml IMPRESSION: 1. Lexiscan-induced reversible decreased myocardial perfusion of the lateral wall of the LEFT ventricle. 2. Normal left ventricular wall motion. 3. Left ventricular ejection fraction 42% 4. Intermediate-risk stress test findings*. *2012 Appropriate Use Criteria for Coronary Revascularization Focused Update: J Am Coll Cardiol. [id number]. http://[email or web address]?[email or web address] Electronically Signed   By: Lavonia Dana M.D.   On: 09/29/2015 12:44  Personally viewed   Telemetry: SVT episodes (PAT at times), now NSR Personally viewed.   EKG:   NSR 79 Personally viewed.  Cardiac Studies:  NUC - lateral ischemia. EF 42%  Meds: Scheduled Meds: . aspirin EC  81 mg Oral Daily  . azithromycin  500 mg Oral Daily  .  cefTRIAXone (ROCEPHIN)  IV  1 g Intravenous Q24H  . heparin  5,000 Units Subcutaneous 3 times per day  . insulin aspart  0-9 Units Subcutaneous TID WC  . metoprolol tartrate  50 mg Oral BID  . sodium chloride  3 mL  Intravenous Q12H  . sodium chloride  3 mL Intravenous Q12H   Continuous Infusions: . sodium chloride 1 mL/kg/hr (09/30/15 2030)   PRN Meds:.sodium chloride, acetaminophen **OR** acetaminophen, ALPRAZolam, dextromethorphan-guaiFENesin, levalbuterol, sodium chloride  Assessment/Plan:  Principal Problem:   Sepsis ([place]) Active Problems:   Coronary atherosclerosis of native coronary artery   Essential hypertension, benign   Mixed hyperlipidemia   Fever   SOB (shortness of breath)   Atrial fibrillation with RVR (HCC)   Diabetes mellitus without complication (HCC)   Hypoxia   Typical atrial flutter (HCC)   S/P CABG x 4   Abnormal nuclear stress test - Intermediate Risk, Lateral ischemia, EF 42%  Demand ischemia  - Trop low level elevation  - Stress test abnormal, lateral ischemia  - EF 42% on NUC  -  cath today  - 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   Fever  - recent 101, now AFeb  - Ceftriaxone per primary team  - no PNA on CT scan.   - empiric tx for bronchitis  CAD  - post CABG ten years ago  - 2006, (LIMA to LAD, SVG to PDA, SVG to OM1 and OM2)  - 9am Dr. Ellyn Hack for 10/01/15, Tues  Dyspnea  - improved (main complaint coming in)  - EF on ECHO 70%, on NUC 42%.   Essential hypertension  - off ARB - he thinks this is contributing to AFIB.   SKAINS, [place] 10/01/2015, 9:30 AM

## 2015-10-01 NOTE — Progress Notes (Signed)
Pt. Arrived to, and oriented to unit. Patient stable at this time.

## 2015-10-01 NOTE — Progress Notes (Signed)
HR afib 160-170 patient is asymptomatic. BP 144/94. Dr Marlou Porch paged and made aware Metoprolol 50mg  Po and xanax .25mg  po given. Dr Marlou Porch aware and advises to continue to monitor patient. Will continue to monitor patient.

## 2015-10-01 NOTE — Progress Notes (Signed)
Dr. Irish Lack Subjective:   Transient temp overnight again. No source. IM team following.  No CP, no SOB. Mild cough, sputum. ?bronchitis. [place] to cath lab  Objective:  Vital Signs in the last 24 hours: Temp:  [97.9 F (36.6 C)-101.1 F (38.4 C)] 98 F (36.7 C) (12/20 0721) Pulse Rate:  [70-86] 70 (12/19 2153) Resp:  [16-20] 18 (12/20 0721) BP: (119-171)/(64-87) 171/87 mmHg (12/20 0721) SpO2:  [92 %-98 %] 92 % (12/20 0721) Weight:  [173 lb 11.6 oz (78.8 kg)] 173 lb 11.6 oz (78.8 kg) (12/20 0600)  Intake/Output from previous day: 12/19 0701 - 12/20 0700 In: 1012.1 [P.O.:120; I.V.:842.1; IV Piggyback:50] Out: [id number] [Urine:575]   Physical Exam: General: Well developed,elderly, in no acute distress. Head:  Normocephalic and atraumatic. Lungs: Clear to auscultation and percussion. Heart: Normal S1 and S2.  No murmur, rubs or gallops.  Abdomen: soft, non-tender, positive bowel sounds. Extremities: No clubbing or cyanosis. No edema. Neurologic: Alert and oriented x 3.    Lab Results:  Recent Labs  09/30/15 0445 10/01/15 0505  WBC 5.7 7.0  HGB 11.0* 11.0*  PLT 193 217    Recent Labs  09/30/15 0445 10/01/15 0505  NA 138 138  K 3.8 3.6  CL 104 105  CO2 26 24  GLUCOSE 147* 124*  BUN 14 15  CREATININE 0.95 0.82    Recent Labs  09/28/15 2140 09/29/15 0337  TROPONINI 0.37* 0.26*   Hepatic Function Panel  Recent Labs  09/30/15 0445  PROT 5.5*  ALBUMIN 2.8*  AST 21  ALT 19  ALKPHOS 51  BILITOT 0.8    Recent Labs  09/29/15 0337  CHOL 154   No results for input(s): PROTIME in the last 72 hours.  Imaging: Nm Myocar Multi W/spect W/wall Motion / Ef  09/29/2015  CLINICAL DATA:  Shortness of breath beginning Friday and Saturday, history diabetes mellitus, hypertension, hyperlipidemia, coronary artery disease post CABG EXAM: MYOCARDIAL IMAGING WITH SPECT (REST AND PHARMACOLOGIC-STRESS) GATED LEFT VENTRICULAR WALL MOTION STUDY LEFT VENTRICULAR  EJECTION FRACTION TECHNIQUE: Standard myocardial SPECT imaging was performed after resting intravenous injection of 10 mCi Tc-62m sestamibi. Subsequently, intravenous infusion of Lexiscan was performed under the supervision of the Cardiology staff. At peak effect of the drug, 30 mCi Tc-73m sestamibi was injected intravenously and standard myocardial SPECT imaging was performed. Quantitative gated imaging was also performed to evaluate left ventricular wall motion, and estimate left ventricular ejection fraction. COMPARISON:  None. FINDINGS: Perfusion: Moderate-sized area of decreased myocardial perfusion at the lateral wall of the LEFT ventricle on image its obtained following pharmacologic stress. Reperfusion of this zone on resting images. No pulmonary uptake of tracer. Wall Motion: Normal left ventricular wall motion. No left ventricular dilation. Left Ventricular Ejection Fraction: 42 % End diastolic volume 87 ml End systolic volume 51 ml IMPRESSION: 1. Lexiscan-induced reversible decreased myocardial perfusion of the lateral wall of the LEFT ventricle. 2. Normal left ventricular wall motion. 3. Left ventricular ejection fraction 42% 4. Intermediate-risk stress test findings*. *2012 Appropriate Use Criteria for Coronary Revascularization Focused Update: J Am Coll Cardiol. [id number]. http://[email or web address]?[email or web address] Electronically Signed   By: Lavonia Dana M.D.   On: 09/29/2015 12:44  Personally viewed   Telemetry: SVT episodes (PAT at times), now NSR Personally viewed.   EKG:   NSR 79 Personally viewed.  Cardiac Studies:  NUC - lateral ischemia. EF 42%  Meds: Scheduled Meds: . aspirin EC  81 mg Oral Daily  . azithromycin  500 mg Oral Daily  .  cefTRIAXone (ROCEPHIN)  IV  1 g Intravenous Q24H  . heparin  5,000 Units Subcutaneous 3 times per day  . insulin aspart  0-9 Units Subcutaneous TID WC  . metoprolol tartrate  50 mg Oral BID  . sodium chloride  3 mL  Intravenous Q12H  . sodium chloride  3 mL Intravenous Q12H   Continuous Infusions: . sodium chloride 1 mL/kg/hr (09/30/15 2030)   PRN Meds:.sodium chloride, acetaminophen **OR** acetaminophen, ALPRAZolam, dextromethorphan-guaiFENesin, levalbuterol, sodium chloride  Assessment/Plan:  Principal Problem:   Sepsis ([place]) Active Problems:   Coronary atherosclerosis of native coronary artery   Essential hypertension, benign   Mixed hyperlipidemia   Fever   SOB (shortness of breath)   Atrial fibrillation with RVR (HCC)   Diabetes mellitus without complication (HCC)   Hypoxia   Typical atrial flutter (HCC)   S/P CABG x 4   Abnormal nuclear stress test - Intermediate Risk, Lateral ischemia, EF 42%  Demand ischemia  - Trop low level elevation  - Stress test abnormal, lateral ischemia  - EF 42% on NUC  -  cath today  - 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   Fever  - recent 101, now AFeb  - Ceftriaxone per primary team  - no PNA on CT scan.   - empiric tx for bronchitis  CAD  - post CABG ten years ago  - 2006, (LIMA to LAD, SVG to PDA, SVG to OM1 and OM2)  - 9am Dr. Ellyn Hack for 10/01/15, Tues  Dyspnea  - improved (main complaint coming in)  - EF on ECHO 70%, on NUC 42%.   Essential hypertension  - off ARB - he thinks this is contributing to AFIB.   Jonathan [name], [place] 10/01/2015, 9:30 AM

## 2015-10-01 NOTE — Care Management Important Message (Signed)
Important Message  Patient Details  Name: Jonathan [name] MRN: [id number] Date of Birth: 07-24-1929   Medicare Important Message Given:  Yes    [name] P [name] 10/01/2015, 12:40 PM

## 2015-10-01 NOTE — Progress Notes (Addendum)
[facility] PROGRESS NOTE  Jonathan [name] [id number] DOB: April 09, 1929 DOA: 09/28/2015 PCP: Irven Shelling, MD  Length of stay: 3   Assessment/Plan: Principal Problem:   Sepsis ([place]) Active Problems:   Coronary atherosclerosis of native coronary artery   Essential hypertension, benign   Mixed hyperlipidemia   Fever   SOB (shortness of breath)   Atrial fibrillation with RVR ([place])   Diabetes mellitus without complication (HCC)   Hypoxia   Typical atrial flutter (HCC)   S/P CABG x 4   Abnormal nuclear stress test - Intermediate Risk, Lateral ischemia, EF 42%    History of present illness 81 y.o.male with known history of CAD, CABG 2006, (LIMA to LAD, SVG to PDA, SVG to OM1 and OM2), Hypertension, hyperlipidemia, and diabetes who was admitted with dyspnea and palpitations.  EMS brought patient to ER and noted that he was initally in SVT, treated with adenosine 6 mg and 12 mg. HR slowed to atrial fib. He was treated with IV diltiazem 10 mg in ER for HR of 130 bpm. and he converted to NSR. Vital signs in ER, BP 117/57, HR 109, O2 Sat 87%, febrile at 101.4. Flu swab was negative for influenza.  Labs demonstrated troponin of 0.04; 0.34. WBC 8.8, Hgb 12.2, HCT 35.8; Glucose 209. K+ CXR ateletasis in the lung bases. Mild vascular congestion. He was treated also with insulin, ECASA, Xopenex, and heparin. EKG NSR with no acute ST-T wave changes. We are asked to see patient concerning his atrial arrhythmia, positive troponin and questionable CHF.   Patient states that he began to have problems about 18 months ago with his BP. He states that he was on Cozaar, but by the afternoon his BP would be too low and his HR would be racing. His PCP changed him to losartan and made it 25 mg TID about 5 days ago. He was feeling ok until last night. He awoke extremely dyspneic.     Assessment and plan  Sepsis (HCC):/fever , almost cyclical every 24 hrs   pt appeared  septic with  fever, tachycardia, tachypnea on admission. The source of the infection is not clear. He has SOB with oxygen desaturation, pointing to possible respiratory infection, but CXR/CT chest is negative and patient does not have respiratory symptoms. Influenza PCR negative Patient initiated on empiricbroad-spectrum antibiotics48 hours,blood culture, urine culture negative, DC'd  vancomycin/Zosyn, now on  Rocephin, azithromycin for cough, possible acute bronchitis Repeat CXR   Patient family would like me to start workup of fever of unknown origin, labs ordered    SOB: Likely secondary to arrhythmia, NSR with MAT,abnormal troponin,- echo, lexiscan myoview abnormal, EF 42%, plan is for cardiac cath today   CAD: s/p of CABG 10 years ago. No chest pain. But presents with new onset Afib with RVR - continue ASA - cycle CE q6 x3 and repeat her EKG in the am  Abnormal Lexiscan Myoview , cardiac cath Severe 3 vessel obstructive CAD, medical therapy. No suitable targets for PCI  Possible new onset Atrial Fibrillation with RVR: CHA2DS2-VASc Score is 4, needs oral anticoagulation if become persistent.  ,Cardiology recommended anticoagulation after cath, Cardizem drip DC'd, switched to by mouth metoprolol TSH, Free t4 and t3-within normal limits increase metoprolol to 50 BID Choice of anticoagulant per cards     Essential hypertension, benign: Off Cardizem gtt, now on  PO metoprolol -hold losartan    HLD: Last LDL was not on record, patient is not taking medications  currently. -Check FLP  Diet-controlled DM-II: Last A1c not on record. Patient is not taking medications at home. Blood sugar 20 on the map -SSI Hemoglobin A1c 8.1   DVT prophylaxsis heparin  Code Status:      Code Status Orders        Start     Ordered   09/28/15 0609  Full code   Continuous     09/28/15 0609      Family Communication: Discussed in detail with the patient, all imaging results, lab results explained to the  patient   Disposition Plan: tx to tele       Consultants:  Cardiology  Procedures:  Myoview  Antibiotics: Anti-infectives    Start     Dose/Rate Route Frequency Ordered Stop   09/30/15 1300  cefTRIAXone (ROCEPHIN) 1 g in dextrose 5 % 50 mL IVPB     1 g 100 mL/hr over 30 Minutes Intravenous Every 24 hours 09/30/15 1221     09/30/15 1230  azithromycin (ZITHROMAX) tablet 500 mg     500 mg Oral Daily 09/30/15 1221     09/28/15 1700  vancomycin (VANCOCIN) IVPB 1000 mg/200 mL premix  Status:  Discontinued     1,000 mg 200 mL/hr over 60 Minutes Intravenous Every 12 hours 09/28/15 0708 09/30/15 1229   09/28/15 1200  piperacillin-tazobactam (ZOSYN) IVPB 3.375 g  Status:  Discontinued     3.375 g 12.5 mL/hr over 240 Minutes Intravenous 3 times per day 09/28/15 0708 09/30/15 1229   09/28/15 0330  piperacillin-tazobactam (ZOSYN) IVPB 3.375 g     3.375 g 100 mL/hr over 30 Minutes Intravenous  Once 09/28/15 0315 09/28/15 0529   09/28/15 0330  vancomycin (VANCOCIN) IVPB 1000 mg/200 mL premix     1,000 mg 200 mL/hr over 60 Minutes Intravenous  Once 09/28/15 0315 09/28/15 0853         HPI/Subjective: Low-grade fever last night, no cough  ,No CP, no SOB  Objective: Filed Vitals:   10/01/15 1145 10/01/15 1200 10/01/15 1206 10/01/15 1215  BP: 163/92 146/91 146/91 163/98  Pulse: 98 95  88  Temp:   99.4 F (37.4 C)   TempSrc:   Oral   Resp:   20   Height:      Weight:      SpO2: 97% 99% 100% 99%    Intake/Output Summary (Last 24 hours) at 10/01/15 1233 Last data filed at 10/01/15 0700  Gross per 24 hour  Intake  892.1 ml  Output    300 ml  Net  592.1 ml    Exam:  General: Well developed,elderly, in no acute distress. Head: Normocephalic and atraumatic. Lungs: Clear to auscultation and percussion. Heart: Normal S1 and S2. No murmur, rubs or gallops.  Abdomen: soft, non-tender, positive bowel sounds. Extremities: No clubbing or cyanosis. No edema. Neurologic:  Alert and oriented x 3.   Data Review   Micro Results Recent Results (from the past 240 hour(s))  Blood Culture (routine x 2)     Status: None (Preliminary result)   Collection Time: 09/28/15  3:38 AM  Result Value Ref Range Status   Specimen Description BLOOD RIGHT ARM  Final   Special Requests BOTTLES DRAWN AEROBIC AND ANAEROBIC 10ML  Final   Culture NO GROWTH 2 DAYS  Final   Report Status PENDING  Incomplete  Blood Culture (routine x 2)     Status: None (Preliminary result)   Collection Time: 09/28/15  3:43 AM  Result Value Ref Range  Status   Specimen Description BLOOD LEFT ARM  Final   Special Requests BOTTLES DRAWN AEROBIC AND ANAEROBIC 10ML  Final   Culture NO GROWTH 2 DAYS  Final   Report Status PENDING  Incomplete  Urine culture     Status: None   Collection Time: 09/28/15  5:51 AM  Result Value Ref Range Status   Specimen Description URINE, RANDOM  Final   Special Requests NONE  Final   Culture NO GROWTH 1 DAY  Final   Report Status 09/29/2015 FINAL  Final  MRSA PCR Screening     Status: None   Collection Time: 09/28/15  1:36 PM  Result Value Ref Range Status   MRSA by PCR NEGATIVE NEGATIVE Final    Comment:        The GeneXpert MRSA Assay (FDA approved for NASAL specimens only), is one component of a comprehensive MRSA colonization surveillance program. It is not intended to diagnose MRSA infection nor to guide or monitor treatment for MRSA infections.     Radiology Reports Dg Chest 2 View  09/28/2015  CLINICAL DATA:  Fever, shortness of breath, and cough for 2 days. EXAM: CHEST  2 VIEW COMPARISON:  04/20/2005 FINDINGS: Postoperative changes in the mediastinum. Shallow inspiration with atelectasis in the lung bases. No focal consolidation. Mild cardiac enlargement. Mild vascular congestion. No edema. No blunting of costophrenic angles. No pneumothorax. Calcified and tortuous aorta. Degenerative changes in the spine and shoulders. IMPRESSION: Shallow  inspiration with atelectasis in the lung bases. Cardiac enlargement with mild vascular congestion. No focal consolidation or edema. Electronically Signed   By: Lucienne Capers M.D.   On: 09/28/2015 03:37   Ct Chest Wo Contrast  09/28/2015  CLINICAL DATA:  Shortness of breath. Past medical history of hypertension, coronary artery disease, palpitations, hyperlipidemia. EXAM: CT CHEST WITHOUT CONTRAST TECHNIQUE: Multidetector CT imaging of the chest was performed following the standard protocol without IV contrast. COMPARISON:  None. FINDINGS: Heart size is upper normal. No pericardial effusion. Coronary artery calcifications noted. Patient is status post CABG. Scattered atherosclerotic changes noted along the walls of the normal-caliber thoracic aorta. Scattered small lymph nodes within the mediastinum. No mass or enlarged lymph nodes identified within the mediastinum or perihilar regions. Mild scarring/atelectasis noted at each lung base. Trace pleural effusions versus pleural thickening at each lung base. Lungs otherwise clear. No evidence of pneumonia. No evidence of congestive heart failure. No pneumothorax. Trachea and central bronchi are unremarkable. Stone within the gallbladder measures at least 1.8 cm diameter. Limited images of the upper abdomen are otherwise unremarkable. Mild degenerative change noted throughout the thoracic spine. No acute appearing osseous abnormality IMPRESSION: 1. No evidence of acute intrathoracic abnormality. No evidence of pneumonia. No significant pleural effusion. Heart size is upper normal. No evidence of congestive heart failure. 2. Mild atelectasis/scarring at each lung base. Trace pleural effusions, versus more likely chronic pleural thickening, at each lung base. 3. Cholelithiasis, incompletely imaged, without obvious evidence of acute cholecystitis. Electronically Signed   By: Franki Cabot M.D.   On: 09/28/2015 22:49   Nm Myocar Multi W/spect W/wall Motion /  Ef  09/29/2015  CLINICAL DATA:  Shortness of breath beginning Friday and Saturday, history diabetes mellitus, hypertension, hyperlipidemia, coronary artery disease post CABG EXAM: MYOCARDIAL IMAGING WITH SPECT (REST AND PHARMACOLOGIC-STRESS) GATED LEFT VENTRICULAR WALL MOTION STUDY LEFT VENTRICULAR EJECTION FRACTION TECHNIQUE: Standard myocardial SPECT imaging was performed after resting intravenous injection of 10 mCi Tc-62m sestamibi. Subsequently, intravenous infusion of Lexiscan was performed under  the supervision of the Cardiology staff. At peak effect of the drug, 30 mCi Tc-57m sestamibi was injected intravenously and standard myocardial SPECT imaging was performed. Quantitative gated imaging was also performed to evaluate left ventricular wall motion, and estimate left ventricular ejection fraction. COMPARISON:  None. FINDINGS: Perfusion: Moderate-sized area of decreased myocardial perfusion at the lateral wall of the LEFT ventricle on image its obtained following pharmacologic stress. Reperfusion of this zone on resting images. No pulmonary uptake of tracer. Wall Motion: Normal left ventricular wall motion. No left ventricular dilation. Left Ventricular Ejection Fraction: 42 % End diastolic volume 87 ml End systolic volume 51 ml IMPRESSION: 1. Lexiscan-induced reversible decreased myocardial perfusion of the lateral wall of the LEFT ventricle. 2. Normal left ventricular wall motion. 3. Left ventricular ejection fraction 42% 4. Intermediate-risk stress test findings*. *2012 Appropriate Use Criteria for Coronary Revascularization Focused Update: J Am Coll Cardiol. [id number]. http://content.airportbarriers.com.aspx?articleid=[email or web address] Electronically Signed   By: Lavonia Dana M.D.   On: 09/29/2015 12:44     CBC  Recent Labs Lab 09/28/15 0343 09/29/15 0337 09/30/15 0445 10/01/15 0505  WBC 8.8 6.0 5.7 7.0  HGB 12.2* 10.4* 11.0* 11.0*  HCT 35.8* 30.6* 32.1* 32.3*  PLT 203 190 193 217  MCV  82.5 82.9 81.9 81.6  MCH 28.1 28.2 28.1 27.8  MCHC 34.1 34.0 34.3 34.1  RDW 13.6 13.8 13.5 13.5  LYMPHSABS 0.5*  --   --   --   MONOABS 1.1*  --   --   --   EOSABS 0.0  --   --   --   BASOSABS 0.0  --   --   --     Chemistries   Recent Labs Lab 09/28/15 0343 09/29/15 0337 09/30/15 0445 10/01/15 0505  NA 136 136 138 138  K 4.2 4.1 3.8 3.6  CL 106 105 104 105  CO2 22 26 26 24   GLUCOSE 209* 134* 147* 124*  BUN 21* 17 14 15   CREATININE 0.86 0.94 0.95 0.82  CALCIUM 8.0* 7.8* 7.9* 8.3*  MG 1.8  --   --   --   AST 16 19 21   --   ALT 14* 18 19  --   ALKPHOS 59 51 51  --   BILITOT 0.6 0.8 0.8  --    ------------------------------------------------------------------------------------------------------------------ estimated creatinine clearance is 62.7 mL/min (by C-G formula based on Cr of 0.82). ------------------------------------------------------------------------------------------------------------------ No results for input(s): HGBA1C in the last 72 hours. ------------------------------------------------------------------------------------------------------------------  Recent Labs  09/29/15 0337  CHOL 154  HDL 19*  LDLCALC 112*  TRIG 116  CHOLHDL 8.1   ------------------------------------------------------------------------------------------------------------------  Recent Labs  09/28/15 1832  TSH 2.004   ------------------------------------------------------------------------------------------------------------------ No results for input(s): VITAMINB12, FOLATE, FERRITIN, TIBC, IRON, RETICCTPCT in the last 72 hours.  Coagulation profile  Recent Labs Lab 09/28/15 0615 10/01/15 0505  INR 1.21 1.23    No results for input(s): DDIMER in the last 72 hours.  Cardiac Enzymes  Recent Labs Lab 09/28/15 1832 09/28/15 2140 09/29/15 0337  TROPONINI 0.42* 0.37* 0.26*    ------------------------------------------------------------------------------------------------------------------ Invalid input(s): POCBNP   CBG:  Recent Labs Lab 09/30/15 1126 09/30/15 1659 09/30/15 2122 10/01/15 0725 10/01/15 1212  GLUCAP 152* 109* 135* 115* 109*       Studies: No results found.    Lab Results  Component Value Date   HGBA1C 8.1* 09/28/2015   Lab Results  Component Value Date   LDLCALC 112* 09/29/2015   CREATININE 0.82 10/01/2015       Scheduled Meds: .  aspirin EC  81 mg Oral Daily  . azithromycin  500 mg Oral Daily  . cefTRIAXone (ROCEPHIN)  IV  1 g Intravenous Q24H  . heparin  5,000 Units Subcutaneous 3 times per day  . insulin aspart  0-9 Units Subcutaneous TID WC  . metoprolol tartrate  50 mg Oral BID  . sodium chloride  3 mL Intravenous Q12H  . sodium chloride  3 mL Intravenous Q12H   Continuous Infusions: . sodium chloride      Principal Problem:   Sepsis ([place]) Active Problems:   Coronary atherosclerosis of native coronary artery   Essential hypertension, benign   Mixed hyperlipidemia   Fever   SOB (shortness of breath)   Atrial fibrillation with RVR (HCC)   Diabetes mellitus without complication (HCC)   Hypoxia   Typical atrial flutter (HCC)   S/P CABG x 4   Abnormal nuclear stress test - Intermediate Risk, Lateral ischemia, EF 42%    Time spent: 45 minutes   [facility] Pager [phone]. If 7PM-7AM, please contact night-coverage at [email or web address], password [facility] 10/01/2015, 12:33 PM  LOS: 3 days

## 2015-10-01 NOTE — Interval H&P Note (Signed)
History and Physical Interval Note:  10/01/2015 10:02 AM  Jonathan [name]  has presented today for surgery, with the diagnosis of cp  The various methods of treatment have been discussed with the patient and family. After consideration of risks, benefits and other options for treatment, the patient has consented to  Procedure(s): Left Heart Cath and Cors/Grafts Angiography (N/A) as a surgical intervention .  The patient's history has been reviewed, patient examined, no change in status, stable for surgery.  I have reviewed the patient's chart and labs.  Questions were answered to the patient's satisfaction.   Cath Lab Visit (complete for each Cath Lab visit)  Clinical Evaluation Leading to the Procedure:   ACS: Yes.    Non-ACS:    Anginal Classification: CCS III  Anti-ischemic medical therapy: Minimal Therapy (1 class of medications)  Non-Invasive Test Results: Intermediate-risk stress test findings: cardiac mortality 1-3%/year  Prior CABG: Previous CABG        Collier [facility] 10/01/2015 10:02 AM

## 2015-10-02 ENCOUNTER — Inpatient Hospital Stay (HOSPITAL_COMMUNITY): Payer: Medicare Other

## 2015-10-02 LAB — PROTEIN ELECTROPHORESIS, SERUM
A/G Ratio: 0.9 (ref 0.7–1.7)
ALPHA-1-GLOBULIN: 0.3 g/dL (ref 0.0–0.4)
ALPHA-2-GLOBULIN: 0.9 g/dL (ref 0.4–1.0)
Albumin ELP: 2.7 g/dL — ABNORMAL LOW (ref 2.9–4.4)
Beta Globulin: 0.9 g/dL (ref 0.7–1.3)
GAMMA GLOBULIN: 1.1 g/dL (ref 0.4–1.8)
Globulin, Total: 3.1 g/dL (ref 2.2–3.9)
Total Protein ELP: 5.8 g/dL — ABNORMAL LOW (ref 6.0–8.5)

## 2015-10-02 LAB — GLUCOSE, CAPILLARY
GLUCOSE-CAPILLARY: 113 mg/dL — AB (ref 65–99)
GLUCOSE-CAPILLARY: 119 mg/dL — AB (ref 65–99)
GLUCOSE-CAPILLARY: 175 mg/dL — AB (ref 65–99)
Glucose-Capillary: 135 mg/dL — ABNORMAL HIGH (ref 65–99)

## 2015-10-02 LAB — HEPATITIS PANEL, ACUTE
HEP A IGM: NEGATIVE
HEP B C IGM: NEGATIVE
Hepatitis B Surface Ag: NEGATIVE

## 2015-10-02 LAB — ANTI-DNA ANTIBODY, DOUBLE-STRANDED

## 2015-10-02 LAB — CMV ANTIBODY, IGG (EIA): CMV AB - IGG: 3.6 U/mL — AB (ref 0.00–0.59)

## 2015-10-02 LAB — HIV ANTIBODY (ROUTINE TESTING W REFLEX): HIV Screen 4th Generation wRfx: NONREACTIVE

## 2015-10-02 LAB — CYCLIC CITRUL PEPTIDE ANTIBODY, IGG/IGA: CCP ANTIBODIES IGG/IGA: 8 U (ref 0–19)

## 2015-10-02 LAB — EPSTEIN-BARR VIRUS NUCLEAR ANTIGEN ANTIBODY, IGG: EBV NA IgG: 33.9 U/mL — ABNORMAL HIGH (ref 0.0–17.9)

## 2015-10-02 MED ORDER — RIVAROXABAN 20 MG PO TABS
20.0000 mg | ORAL_TABLET | Freq: Every day | ORAL | Status: DC
  Administered 2015-10-02 – 2015-10-03 (×2): 20 mg via ORAL
  Filled 2015-10-02 (×2): qty 1

## 2015-10-02 MED ORDER — LOSARTAN POTASSIUM 50 MG PO TABS
50.0000 mg | ORAL_TABLET | Freq: Every day | ORAL | Status: DC
  Filled 2015-10-02 (×2): qty 1

## 2015-10-02 MED ORDER — CARVEDILOL 12.5 MG PO TABS
12.5000 mg | ORAL_TABLET | Freq: Two times a day (BID) | ORAL | Status: DC
  Administered 2015-10-02 – 2015-10-03 (×2): 12.5 mg via ORAL
  Filled 2015-10-02 (×2): qty 1

## 2015-10-02 NOTE — Progress Notes (Addendum)
Occupational Therapy Treatment Patient Details Name: Jonathan [name] MRN: [id number] DOB: 07/30/1929 Today's Date: 10/02/2015    History of present illness Pt admitted with sepsis, SOB, possible new onset afib with RVR.  PMH: HTN, HLD, DM, CAD s/p CABG, back pain, BPH.   OT comments  Pt making steady progress. Able to ambulate around room and unit for functional tasks with S. O2 Sats RA 97. Pt with minimal balance deficits. Discussed reducing risk of falls at home and slowly increasing activity tolerance. Son will be able to assist as needed after D/C. Will continue to follow. Encourage ambulation with staff.    Follow Up Recommendations  Home health OT;Supervision/Assistance - 24 hour    Equipment Recommendations  None recommended by OT    Recommendations for Other Services      Precautions / Restrictions Precautions Precautions: Fall       Mobility Bed Mobility Overal bed mobility: Modified Independent                Transfers Overall transfer level: Needs assistance   Transfers: Sit to/from Stand Sit to Stand: Supervision              Balance Overall balance assessment: Needs assistance   Sitting balance-Leahy Scale: Good       Standing balance-Leahy Scale: Good                       ADL                           Toilet Transfer: Supervision/safety;Ambulation;Regular Museum/gallery exhibitions officer and Hygiene: Modified independent         General ADL Comments: Overall set up for ADL      Vision                     Perception     Praxis      Cognition   Behavior During Therapy: [facility] for tasks assessed/performed Overall Cognitive Status: Within Functional Limits for tasks assessed (family confirmed pt at baseline)       Memory: Decreased short-term memory               Extremity/Trunk Assessment               Exercises Other Exercises Other Exercises: theraband ex for general UB  strengthening   Shoulder Instructions       General Comments      Pertinent Vitals/ Pain       Pain Assessment: 0-10 Pain Score: 3  Pain Location: headache Pain Descriptors / Indicators: Aching Pain Intervention(s): Limited activity within patient's tolerance  Home Living                                          Prior Functioning/Environment              Frequency Min 2X/week     Progress Toward Goals  OT Goals(current goals can now be found in the care plan section)  Progress towards OT goals: Progressing toward goals  Acute Rehab OT Goals Patient Stated Goal: go home after successful cath procedure OT Goal Formulation: With patient Time For Goal Achievement: 10/14/15 Potential to Achieve Goals: Good ADL Goals Pt Will Perform Grooming: standing;with modified independence Pt Will Perform Lower Body Bathing: sit  to/from stand;with modified independence Pt Will Perform Lower Body Dressing: with modified independence;sit to/from stand Pt Will Transfer to Toilet: with modified independence;ambulating;regular height toilet Pt Will Perform Toileting - Clothing Manipulation and hygiene: with modified independence;sit to/from stand Pt Will Perform Tub/Shower Transfer: Shower transfer;ambulating;with supervision  Plan Discharge plan remains appropriate    Co-evaluation                 End of Session Equipment Utilized During Treatment: Gait belt   Activity Tolerance Patient tolerated treatment well   Patient Left in chair;with call bell/phone within reach;with family/visitor present   Nurse Communication Mobility status        Time: [phone] OT Time Calculation (min): 24 min  Charges: OT General Charges $OT Visit: 1 Procedure OT Treatments $Self Care/Home Management : 23-37 mins  Phylliss Strege,HILLARY 10/02/2015, 9:30 AM   Maurie Boettcher, OTR/L  [phone] 10/02/2015

## 2015-10-02 NOTE — Progress Notes (Signed)
Patient has order for continuous pulse oximetry. RN came to patient's room, pulse oximetry was unhooked, lying on the bed, patient refused to put it back on, stating "It's bothering me. It wraps around my neck every time I move. I don't need it back on." Patient's O2 sat 93-95% on room air, denied shortness of breath, denied acute distress. Baltazar Najjar NP paged and notified.

## 2015-10-02 NOTE — Progress Notes (Signed)
Physical Therapy Treatment Patient Details Name: Jonathan [name] MRN: [id number] DOB: February 01, 1929 Today's Date: 10/23/2015    History of Present Illness Pt admitted with sepsis, SOB, possible new onset afib with RVR.  PMH: HTN, HLD, DM, CAD s/p CABG, back pain, BPH.    PT Comments    Pt is getting up to walk with PT supervising and is notably somewhat tired but not unsafe.  He is being given multiple tests today and is just simply fatigued from the persistent process.  Recommend he go with no AD for now, and pt is not likely to use one even if recommended.  Had balance test with OT today and will not expect further testing needed.  Follow Up Recommendations  No PT follow up     Equipment Recommendations  None recommended by PT    Recommendations for Other Services       Precautions / Restrictions Precautions Precautions: Fall    Mobility  Bed Mobility Overal bed mobility: Modified Independent                Transfers Overall transfer level: Modified independent Equipment used: 1 person hand held assist Transfers: Sit to/from [facility] Sit to Stand: Supervision;Min guard Stand pivot transfers: Supervision;Min guard       General transfer comment: reminded of hand placement  Ambulation/Gait Ambulation/Gait assistance: Supervision Ambulation Distance (Feet): 200 Feet Assistive device: None Gait Pattern/deviations: Step-through pattern;Drifts right/left;Wide base of support Gait velocity: reduced Gait velocity interpretation: Below normal speed for age/gender General Gait Details: wider base to steady himself   Stairs            Wheelchair Mobility    Modified Rankin (Stroke Patients Only)       Balance     Sitting balance-Leahy Scale: Good       Standing balance-Leahy Scale: Good                      Cognition Arousal/Alertness: Awake/alert Behavior During Therapy: WFL for tasks assessed/performed Overall  Cognitive Status: Within Functional Limits for tasks assessed       Memory: Decreased recall of precautions;Decreased short-term memory   Safety/Judgement: Decreased awareness of safety;Decreased awareness of deficits          Exercises      General Comments        Pertinent Vitals/Pain Pain Assessment: No/denies pain    Home Living                      Prior Function            PT Goals (current goals can now be found in the care plan section) Acute Rehab PT Goals Patient Stated Goal: get home Progress towards PT goals: Progressing toward goals    Frequency  Min 3X/week    PT Plan Current plan remains appropriate    Co-evaluation             End of Session   Activity Tolerance: Patient tolerated treatment well Patient left: in bed;with call bell/phone within reach     Time: 1656-1710 PT Time Calculation (min) (ACUTE ONLY): 14 min  Charges:  $Gait Training: 8-22 mins                    G Codes:      [name] 23-Oct-2015, 6:54 PM   [name], PT MS Acute Rehab Dept. Number: [phone] [phone] and [place] [phone]

## 2015-10-02 NOTE — Progress Notes (Signed)
Cardiologist: Dr. Irish Lack Subjective:   Transient temp overnight again. No clear source. IM team following.  No CP, no SOB. Mild cough, sputum. ?bronchitis.   Cath yesterday - occluded SVG to OM. No PCI Objective:  Vital Signs in the last 24 hours: Temp:  [98.4 F (36.9 C)-102.3 F (39.1 C)] 99 F (37.2 C) (12/21 0549) Pulse Rate:  [68-122] 87 (12/21 0840) Resp:  [16-20] 20 (12/21 0549) BP: (124-167)/(57-98) 167/77 mmHg (12/21 0840) SpO2:  [90 %-100 %] 100 % (12/21 0549) Weight:  [174 lb 9.7 oz (79.2 kg)] 174 lb 9.7 oz (79.2 kg) (12/20 1554)  Intake/Output from previous day: 12/20 0701 - 12/21 0700 In: 62 [P.O.:480; IV Piggyback:50] Out: 650 [Urine:650]   Physical Exam: General: Well developed,elderly, in no acute distress. Head:  Normocephalic and atraumatic. Lungs: Clear to auscultation and percussion. Heart: Normal S1 and S2.  No murmur, rubs or gallops.  Abdomen: soft, non-tender, positive bowel sounds. Extremities: No clubbing or cyanosis. No edema. Neurologic: Alert and oriented x 3.    Lab Results:  Recent Labs  10/01/15 0505 10/01/15 1415  WBC 7.0 8.0  HGB 11.0* 12.3*  PLT 217 247    Recent Labs  09/30/15 0445 10/01/15 0505 10/01/15 1415  NA 138 138  --   K 3.8 3.6  --   CL 104 105  --   CO2 26 24  --   GLUCOSE 147* 124*  --   BUN 14 15  --   CREATININE 0.95 0.82 0.84   No results for input(s): TROPONINI in the last 72 hours.  Invalid input(s): CK, MB Hepatic Function Panel  Recent Labs  09/30/15 0445  PROT 5.5*  ALBUMIN 2.8*  AST 21  ALT 19  ALKPHOS 51  BILITOT 0.8    Imaging: Dg Chest Port 1 View  10/01/2015  CLINICAL DATA:  Fever and shortness of Breath EXAM: PORTABLE CHEST - 1 VIEW COMPARISON:  09/28/2015 FINDINGS: Cardiac shadow is stable. Postoperative changes are seen. Increased vascular congestion with interstitial edema is noted. Slight increased density is noted in the medial right lung base which may represent some  very early infiltrate. No bony abnormality noted. IMPRESSION: Increased interstitial edema. Question early right basilar infiltrate. Electronically Signed   By: Inez Catalina M.D.   On: 10/01/2015 13:59  Personally viewed   Telemetry: Episodes of rapid AFIB, now NSR Personally viewed.   EKG:   NSR 79 Personally viewed.  Cardiac Studies:  NUC - lateral ischemia. EF 42%  Meds: Scheduled Meds: . azithromycin  500 mg Oral Daily  . carvedilol  12.5 mg Oral BID WC  . cefTRIAXone (ROCEPHIN)  IV  1 g Intravenous Q24H  . insulin aspart  0-9 Units Subcutaneous TID WC  . rivaroxaban  20 mg Oral Daily  . sodium chloride  3 mL Intravenous Q12H  . sodium chloride  3 mL Intravenous Q12H   Continuous Infusions:   PRN Meds:.sodium chloride, acetaminophen **OR** acetaminophen, dextromethorphan-guaiFENesin, levalbuterol, sodium chloride  Assessment/Plan:  Principal Problem:   Sepsis ([place]) Active Problems:   Coronary atherosclerosis of native coronary artery   Essential hypertension, benign   Mixed hyperlipidemia   Fever   SOB (shortness of breath)   Atrial fibrillation with RVR (HCC)   Diabetes mellitus without complication (HCC)   Hypoxia   Typical atrial flutter (HCC)   S/P CABG x 4   Abnormal nuclear stress test - Intermediate Risk, Lateral ischemia, EF 42%  Demand ischemia  - Trop low level elevation  -  Stress test abnormal, lateral ischemia  - EF 42% on NUC  - cath 12/20 with occluded SVG - OM. NO PCI. Med mgt  PSVT/ PAF  - AFIB after adenosine  - CHADS-VASc 4  - Starting Xarelto 20mg  PO QD (wife takes). Discussed risks, bleeding.   - changed metoprolol to 50 BID to coreg 12.5 BID. He is worried that metoprolol caused him to pass out after his CABG. Does not want to take it.   - States that 9 hours after taking high dose Losartan that his heart would race.   Fever  - recent 102, now AFeb  - Ceftriaxone per primary team  - no PNA on CT scan.   - empiric tx for bronchitis. ?  develp infiltrate on xray  CAD  - post CABG ten years ago  - 2006, (LIMA to LAD, SVG to PDA, SVG to OM1 and OM2)  - 9am Dr. Ellyn Hack for 10/01/15, Tues  Dyspnea  - improved (main complaint coming in)  - EF on ECHO 70%, on NUC 42%.   Essential hypertension  - off ARB - he thinks this is contributing to AFIB.  - maybe coreg will be more efficaciaous.   - states that his BP can be labile  - lots of questions.     SKAINS, [place] 10/02/2015, 11:38 AM

## 2015-10-02 NOTE — Progress Notes (Signed)
[name] PROGRESS NOTE  [name] [id number] DOB: January 16, 1929 DOA: 09/28/2015 PCP: Irven Shelling, MD  Length of stay: 4   Assessment/Plan: Principal Problem:   Sepsis ([place]) Active Problems:   Coronary atherosclerosis of native coronary artery   Essential hypertension, benign   Mixed hyperlipidemia   Fever   SOB (shortness of breath)   Atrial fibrillation with RVR ([place])   Diabetes mellitus without complication (HCC)   Hypoxia   Typical atrial flutter (HCC)   S/P CABG x 4   Abnormal nuclear stress test - Intermediate Risk, Lateral ischemia, EF 42%    History of present illness 81 y.o.male with known history of CAD, CABG 2006, (LIMA to LAD, SVG to PDA, SVG to OM1 and OM2), Hypertension, hyperlipidemia, and diabetes who was admitted with dyspnea and palpitations.  EMS brought patient to ER and noted that he was initally in SVT, treated with adenosine 6 mg and 12 mg. HR slowed to atrial fib. He was treated with IV diltiazem 10 mg in ER for HR of 130 bpm. and he converted to NSR. Vital signs in ER, BP 117/57, HR 109, O2 Sat 87%, febrile at 101.4. Flu swab was negative for influenza.  Labs demonstrated troponin of 0.04; 0.34. WBC 8.8, Hgb 12.2, HCT 35.8; Glucose 209. K+ CXR ateletasis in the lung bases. Mild vascular congestion. He was treated also with insulin, ECASA, Xopenex, and heparin. EKG NSR with no acute ST-T wave changes. We are asked to see patient concerning his atrial arrhythmia, positive troponin and questionable CHF.   Patient states that he began to have problems about 18 months ago with his BP. He states that he was on Cozaar, but by the afternoon his BP would be too low and his HR would be racing. His PCP changed him to losartan and made it 25 mg TID about 5 days ago. He was feeling ok until last night. He awoke extremely dyspneic.     Assessment and plan  Sepsis ([place]) -Evidenced by temperature of 102.3, heart rate 122, respiratory  rate of 25  -Suspected pulmonary source as he presented with SOB with oxygen desaturation -CXR/CT chest is negative. Influenza PCR negative -Patient initiated on empiricbroad-spectrum antibiotics48 hours,blood culture, urine culture negative, DC'd  vancomycin/Zosyn, now on  Rocephin, azithromycin for cough, possible acute bronchitis -Pending repeat 2 view chest x-ray    SOB: Likely secondary to arrhythmia, NSR with MAT,abnormal troponin,- echo, lexiscan myoview abnormal, EF 42%  CAD: s/p of CABG 10 years ago. No chest pain. But presents with new onset Afib with RVR - continue ASA - cycle CE q6 x3 and repeat her EKG in the am  Abnormal Lexiscan Myoview   -Cardiac Cath performed on 10/01/2015 showing severe 3 vessel obstructive CAD, medical therapy. No suitable targets for PCI  Possible new onset Atrial Fibrillation with RVR: CHA2DS2-VASc Score is 4, needs oral anticoagulation if become persistent.  ,Cardiology recommended anticoagulation after cath, Cardizem drip DC'd, switched to by mouth metoprolol TSH, Free t4 and t3-within normal limits increase metoprolol to 50 BID He is now anticoagulated with Xarelto    Essential hypertension, benign: -Continue Coreg 12.5 mg by mouth twice a day -Blood pressures remain elevated will restart Losartan at 50 mg by mouth daily  Diet-controlled DM-II:  -Blood sugars controlled  DVT prophylaxsis: Patient fully anticoagulated  Code Status:      Code Status Orders        Start     Ordered  09/28/15 0609  Full code   Continuous     09/28/15 0609      Family Communication: Discussed in detail with the patient, all imaging results, lab results explained to the patient   Disposition Plan: tx to tele       Consultants:  Cardiology  Procedures:  Myoview  Antibiotics: Anti-infectives    Start     Dose/Rate Route Frequency Ordered Stop   09/30/15 1300  cefTRIAXone (ROCEPHIN) 1 g in dextrose 5 % 50 mL IVPB     1 g 100 mL/hr over  30 Minutes Intravenous Every 24 hours 09/30/15 1221     09/30/15 1230  azithromycin (ZITHROMAX) tablet 500 mg     500 mg Oral Daily 09/30/15 1221     09/28/15 1700  vancomycin (VANCOCIN) IVPB 1000 mg/200 mL premix  Status:  Discontinued     1,000 mg 200 mL/hr over 60 Minutes Intravenous Every 12 hours 09/28/15 0708 09/30/15 1229   09/28/15 1200  piperacillin-tazobactam (ZOSYN) IVPB 3.375 g  Status:  Discontinued     3.375 g 12.5 mL/hr over 240 Minutes Intravenous 3 times per day 09/28/15 0708 09/30/15 1229   09/28/15 0330  piperacillin-tazobactam (ZOSYN) IVPB 3.375 g     3.375 g 100 mL/hr over 30 Minutes Intravenous  Once 09/28/15 0315 09/28/15 0529   09/28/15 0330  vancomycin (VANCOCIN) IVPB 1000 mg/200 mL premix     1,000 mg 200 mL/hr over 60 Minutes Intravenous  Once 09/28/15 0315 09/28/15 0853         HPI/Subjective: Low-grade fever last night, no cough  ,No CP, no SOB  Objective: Filed Vitals:   10/02/15 0402 10/02/15 0549 10/02/15 0635 10/02/15 0840  BP:  161/72 154/68 167/77  Pulse:  90  87  Temp:  99 F (37.2 C)    TempSrc:  Oral    Resp:  20    Height:      Weight:      SpO2: 95% 100%      Intake/Output Summary (Last 24 hours) at 10/02/15 1553 Last data filed at 10/02/15 1053  Gross per 24 hour  Intake    603 ml  Output    475 ml  Net    128 ml    Exam:  General: Well developed,elderly, in no acute distress. Head: Normocephalic and atraumatic. Lungs: Clear to auscultation and percussion. Heart: Normal S1 and S2. No murmur, rubs or gallops.  Abdomen: soft, non-tender, positive bowel sounds. Extremities: No clubbing or cyanosis. No edema. Neurologic: Alert and oriented x 3.   Data Review   Micro Results Recent Results (from the past 240 hour(s))  Blood Culture (routine x 2)     Status: None (Preliminary result)   Collection Time: 09/28/15  3:38 AM  Result Value Ref Range Status   Specimen Description BLOOD RIGHT ARM  Final   Special Requests  BOTTLES DRAWN AEROBIC AND ANAEROBIC 10ML  Final   Culture NO GROWTH 4 DAYS  Final   Report Status PENDING  Incomplete  Blood Culture (routine x 2)     Status: None (Preliminary result)   Collection Time: 09/28/15  3:43 AM  Result Value Ref Range Status   Specimen Description BLOOD LEFT ARM  Final   Special Requests BOTTLES DRAWN AEROBIC AND ANAEROBIC 10ML  Final   Culture NO GROWTH 4 DAYS  Final   Report Status PENDING  Incomplete  Urine culture     Status: None   Collection Time: 09/28/15  5:51 AM  Result Value Ref Range Status   Specimen Description URINE, RANDOM  Final   Special Requests NONE  Final   Culture NO GROWTH 1 DAY  Final   Report Status 09/29/2015 FINAL  Final  MRSA PCR Screening     Status: None   Collection Time: 09/28/15  1:36 PM  Result Value Ref Range Status   MRSA by PCR NEGATIVE NEGATIVE Final    Comment:        The GeneXpert MRSA Assay (FDA approved for NASAL specimens only), is one component of a comprehensive MRSA colonization surveillance program. It is not intended to diagnose MRSA infection nor to guide or monitor treatment for MRSA infections.   Culture, blood (single)     Status: None (Preliminary result)   Collection Time: 10/01/15  9:06 PM  Result Value Ref Range Status   Specimen Description BLOOD LEFT ANTECUBITAL  Final   Special Requests IN PEDIATRIC BOTTLE 3CC  Final   Culture NO GROWTH < 24 HOURS  Final   Report Status PENDING  Incomplete    Radiology Reports Dg Chest 2 View  10/02/2015  CLINICAL DATA:  Fever EXAM: CHEST  2 VIEW COMPARISON:  Chest radiograph from one day prior. FINDINGS: Stable configuration of median sternotomy wires. Stable cardiomediastinal silhouette with top-normal heart size. No pneumothorax. Trace bilateral pleural effusions. No overt pulmonary edema. Mild patchy opacity in both lower lobes, not appreciably changed. IMPRESSION: 1. Nonspecific stable mild patchy bilateral lower lobe opacities, likely atelectasis,  cannot exclude pneumonia or aspiration. 2. Trace bilateral pleural effusions. Electronically Signed   By: Ilona Sorrel M.D.   On: 10/02/2015 15:36   Dg Chest 2 View  09/28/2015  CLINICAL DATA:  Fever, shortness of breath, and cough for 2 days. EXAM: CHEST  2 VIEW COMPARISON:  04/20/2005 FINDINGS: Postoperative changes in the mediastinum. Shallow inspiration with atelectasis in the lung bases. No focal consolidation. Mild cardiac enlargement. Mild vascular congestion. No edema. No blunting of costophrenic angles. No pneumothorax. Calcified and tortuous aorta. Degenerative changes in the spine and shoulders. IMPRESSION: Shallow inspiration with atelectasis in the lung bases. Cardiac enlargement with mild vascular congestion. No focal consolidation or edema. Electronically Signed   By: Lucienne Capers M.D.   On: 09/28/2015 03:37   Ct Chest Wo Contrast  09/28/2015  CLINICAL DATA:  Shortness of breath. Past medical history of hypertension, coronary artery disease, palpitations, hyperlipidemia. EXAM: CT CHEST WITHOUT CONTRAST TECHNIQUE: Multidetector CT imaging of the chest was performed following the standard protocol without IV contrast. COMPARISON:  None. FINDINGS: Heart size is upper normal. No pericardial effusion. Coronary artery calcifications noted. Patient is status post CABG. Scattered atherosclerotic changes noted along the walls of the normal-caliber thoracic aorta. Scattered small lymph nodes within the mediastinum. No mass or enlarged lymph nodes identified within the mediastinum or perihilar regions. Mild scarring/atelectasis noted at each lung base. Trace pleural effusions versus pleural thickening at each lung base. Lungs otherwise clear. No evidence of pneumonia. No evidence of congestive heart failure. No pneumothorax. Trachea and central bronchi are unremarkable. Stone within the gallbladder measures at least 1.8 cm diameter. Limited images of the upper abdomen are otherwise unremarkable. Mild  degenerative change noted throughout the thoracic spine. No acute appearing osseous abnormality IMPRESSION: 1. No evidence of acute intrathoracic abnormality. No evidence of pneumonia. No significant pleural effusion. Heart size is upper normal. No evidence of congestive heart failure. 2. Mild atelectasis/scarring at each lung base. Trace pleural effusions, versus more likely chronic pleural thickening, at  each lung base. 3. Cholelithiasis, incompletely imaged, without obvious evidence of acute cholecystitis. Electronically Signed   By: Franki Cabot M.D.   On: 09/28/2015 22:49   Nm Myocar Multi W/spect W/wall Motion / Ef  09/29/2015  CLINICAL DATA:  Shortness of breath beginning Friday and Saturday, history diabetes mellitus, hypertension, hyperlipidemia, coronary artery disease post CABG EXAM: MYOCARDIAL IMAGING WITH SPECT (REST AND PHARMACOLOGIC-STRESS) GATED LEFT VENTRICULAR WALL MOTION STUDY LEFT VENTRICULAR EJECTION FRACTION TECHNIQUE: Standard myocardial SPECT imaging was performed after resting intravenous injection of 10 mCi Tc-19m sestamibi. Subsequently, intravenous infusion of Lexiscan was performed under the supervision of the Cardiology staff. At peak effect of the drug, 30 mCi Tc-49m sestamibi was injected intravenously and standard myocardial SPECT imaging was performed. Quantitative gated imaging was also performed to evaluate left ventricular wall motion, and estimate left ventricular ejection fraction. COMPARISON:  None. FINDINGS: Perfusion: Moderate-sized area of decreased myocardial perfusion at the lateral wall of the LEFT ventricle on image its obtained following pharmacologic stress. Reperfusion of this zone on resting images. No pulmonary uptake of tracer. Wall Motion: Normal left ventricular wall motion. No left ventricular dilation. Left Ventricular Ejection Fraction: 42 % End diastolic volume 87 ml End systolic volume 51 ml IMPRESSION: 1. Lexiscan-induced reversible decreased  myocardial perfusion of the lateral wall of the LEFT ventricle. 2. Normal left ventricular wall motion. 3. Left ventricular ejection fraction 42% 4. Intermediate-risk stress test findings*. *2012 Appropriate Use Criteria for Coronary Revascularization Focused Update: J Am Coll Cardiol. N6492421. http://content.airportbarriers.com.aspx?articleid=[email or web address] Electronically Signed   By: Lavonia Dana M.D.   On: 09/29/2015 12:44   Dg Chest Port 1 View  10/01/2015  CLINICAL DATA:  Fever and shortness of Breath EXAM: PORTABLE CHEST - 1 VIEW COMPARISON:  09/28/2015 FINDINGS: Cardiac shadow is stable. Postoperative changes are seen. Increased vascular congestion with interstitial edema is noted. Slight increased density is noted in the medial right lung base which may represent some very early infiltrate. No bony abnormality noted. IMPRESSION: Increased interstitial edema. Question early right basilar infiltrate. Electronically Signed   By: Inez Catalina M.D.   On: 10/01/2015 13:59     CBC  Recent Labs Lab 09/28/15 0343 09/29/15 0337 09/30/15 0445 10/01/15 0505 10/01/15 1415  WBC 8.8 6.0 5.7 7.0 8.0  HGB 12.2* 10.4* 11.0* 11.0* 12.3*  HCT 35.8* 30.6* 32.1* 32.3* 36.9*  PLT 203 190 193 217 247  MCV 82.5 82.9 81.9 81.6 81.6  MCH 28.1 28.2 28.1 27.8 27.2  MCHC 34.1 34.0 34.3 34.1 33.3  RDW 13.6 13.8 13.5 13.5 13.3  LYMPHSABS 0.5*  --   --   --   --   MONOABS 1.1*  --   --   --   --   EOSABS 0.0  --   --   --   --   BASOSABS 0.0  --   --   --   --     Chemistries   Recent Labs Lab 09/28/15 0343 09/29/15 0337 09/30/15 0445 10/01/15 0505 10/01/15 1415  NA 136 136 138 138  --   K 4.2 4.1 3.8 3.6  --   CL 106 105 104 105  --   CO2 22 26 26 24   --   GLUCOSE 209* 134* 147* 124*  --   BUN 21* 17 14 15   --   CREATININE 0.86 0.94 0.95 0.82 0.84  CALCIUM 8.0* 7.8* 7.9* 8.3*  --   MG 1.8  --   --   --   --  AST 16 19 21   --   --   ALT 14* 18 19  --   --   ALKPHOS 59 51 51  --   --    BILITOT 0.6 0.8 0.8  --   --    ------------------------------------------------------------------------------------------------------------------ estimated creatinine clearance is 61.2 mL/min (by C-G formula based on Cr of 0.84). ------------------------------------------------------------------------------------------------------------------ No results for input(s): HGBA1C in the last 72 hours. ------------------------------------------------------------------------------------------------------------------ No results for input(s): CHOL, HDL, LDLCALC, TRIG, CHOLHDL, LDLDIRECT in the last 72 hours. ------------------------------------------------------------------------------------------------------------------ No results for input(s): TSH, T4TOTAL, T3FREE, THYROIDAB in the last 72 hours.  Invalid input(s): FREET3 ------------------------------------------------------------------------------------------------------------------ No results for input(s): VITAMINB12, FOLATE, FERRITIN, TIBC, IRON, RETICCTPCT in the last 72 hours.  Coagulation profile  Recent Labs Lab 09/28/15 0615 10/01/15 0505  INR 1.21 1.23    No results for input(s): DDIMER in the last 72 hours.  Cardiac Enzymes  Recent Labs Lab 09/28/15 1832 09/28/15 2140 09/29/15 0337  TROPONINI 0.42* 0.37* 0.26*   ------------------------------------------------------------------------------------------------------------------ Invalid input(s): POCBNP   CBG:  Recent Labs Lab 10/01/15 1212 10/01/15 1747 10/01/15 2157 10/02/15 0749 10/02/15 1148  GLUCAP 109* 143* 128* 113* 135*       Studies: Dg Chest 2 View  10/02/2015  CLINICAL DATA:  Fever EXAM: CHEST  2 VIEW COMPARISON:  Chest radiograph from one day prior. FINDINGS: Stable configuration of median sternotomy wires. Stable cardiomediastinal silhouette with top-normal heart size. No pneumothorax. Trace bilateral pleural effusions. No overt pulmonary edema.  Mild patchy opacity in both lower lobes, not appreciably changed. IMPRESSION: 1. Nonspecific stable mild patchy bilateral lower lobe opacities, likely atelectasis, cannot exclude pneumonia or aspiration. 2. Trace bilateral pleural effusions. Electronically Signed   By: Ilona Sorrel M.D.   On: 10/02/2015 15:36   Dg Chest Port 1 View  10/01/2015  CLINICAL DATA:  Fever and shortness of Breath EXAM: PORTABLE CHEST - 1 VIEW COMPARISON:  09/28/2015 FINDINGS: Cardiac shadow is stable. Postoperative changes are seen. Increased vascular congestion with interstitial edema is noted. Slight increased density is noted in the medial right lung base which may represent some very early infiltrate. No bony abnormality noted. IMPRESSION: Increased interstitial edema. Question early right basilar infiltrate. Electronically Signed   By: Inez Catalina M.D.   On: 10/01/2015 13:59      Lab Results  Component Value Date   HGBA1C 8.1* 09/28/2015   Lab Results  Component Value Date   LDLCALC 112* 09/29/2015   CREATININE 0.84 10/01/2015       Scheduled Meds: . azithromycin  500 mg Oral Daily  . carvedilol  12.5 mg Oral BID WC  . cefTRIAXone (ROCEPHIN)  IV  1 g Intravenous Q24H  . insulin aspart  0-9 Units Subcutaneous TID WC  . rivaroxaban  20 mg Oral Q supper  . sodium chloride  3 mL Intravenous Q12H  . sodium chloride  3 mL Intravenous Q12H   Continuous Infusions:    Principal Problem:   Sepsis ([place]) Active Problems:   Coronary atherosclerosis of native coronary artery   Essential hypertension, benign   Mixed hyperlipidemia   Fever   SOB (shortness of breath)   Atrial fibrillation with RVR (HCC)   Diabetes mellitus without complication (HCC)   Hypoxia   Typical atrial flutter (HCC)   S/P CABG x 4   Abnormal nuclear stress test - Intermediate Risk, Lateral ischemia, EF 42%    Time spent: 30 minutes   Kelvin Cellar  [facility] Pager [phone]. If 7PM-7AM, please contact  night-coverage  at [email or web address], password [facility] 10/02/2015, 3:53 PM  LOS: 4 days

## 2015-10-02 NOTE — Progress Notes (Signed)
Patient hr in 150-169 [place] on call paged order for a EKG

## 2015-10-02 NOTE — Progress Notes (Signed)
RN paged this NP because pt's HR on tele was 150-160 and ? Afib. By the time 12 lead EKG performed, pt's HR was in the 80s on tele and confirmed as NSR on EKG. Will follow. KJKG, NP [facility]

## 2015-10-03 DIAGNOSIS — I25110 Atherosclerotic heart disease of native coronary artery with unstable angina pectoris: Secondary | ICD-10-CM

## 2015-10-03 LAB — CULTURE, BLOOD (ROUTINE X 2)
CULTURE: NO GROWTH
CULTURE: NO GROWTH

## 2015-10-03 LAB — CBC
HCT: 34.1 % — ABNORMAL LOW (ref 39.0–52.0)
Hemoglobin: 11.8 g/dL — ABNORMAL LOW (ref 13.0–17.0)
MCH: 28.1 pg (ref 26.0–34.0)
MCHC: 34.6 g/dL (ref 30.0–36.0)
MCV: 81.2 fL (ref 78.0–100.0)
PLATELETS: 255 10*3/uL (ref 150–400)
RBC: 4.2 MIL/uL — AB (ref 4.22–5.81)
RDW: 13.4 % (ref 11.5–15.5)
WBC: 6.3 10*3/uL (ref 4.0–10.5)

## 2015-10-03 LAB — BASIC METABOLIC PANEL
ANION GAP: 11 (ref 5–15)
BUN: 12 mg/dL (ref 6–20)
CALCIUM: 8 mg/dL — AB (ref 8.9–10.3)
CO2: 26 mmol/L (ref 22–32)
CREATININE: 0.81 mg/dL (ref 0.61–1.24)
Chloride: 98 mmol/L — ABNORMAL LOW (ref 101–111)
Glucose, Bld: 143 mg/dL — ABNORMAL HIGH (ref 65–99)
Potassium: 3.3 mmol/L — ABNORMAL LOW (ref 3.5–5.1)
SODIUM: 135 mmol/L (ref 135–145)

## 2015-10-03 LAB — GLUCOSE, CAPILLARY
GLUCOSE-CAPILLARY: 137 mg/dL — AB (ref 65–99)
GLUCOSE-CAPILLARY: 153 mg/dL — AB (ref 65–99)
GLUCOSE-CAPILLARY: 124 mg/dL — AB (ref 65–99)
GLUCOSE-CAPILLARY: 148 mg/dL — AB (ref 65–99)

## 2015-10-03 MED ORDER — ISOSORBIDE MONONITRATE ER 30 MG PO TB24
30.0000 mg | ORAL_TABLET | Freq: Every day | ORAL | Status: DC
  Administered 2015-10-03 – 2015-10-04 (×2): 30 mg via ORAL
  Filled 2015-10-03 (×2): qty 1

## 2015-10-03 MED ORDER — CARVEDILOL 25 MG PO TABS
25.0000 mg | ORAL_TABLET | Freq: Two times a day (BID) | ORAL | Status: DC
  Administered 2015-10-03 – 2015-10-04 (×2): 25 mg via ORAL
  Filled 2015-10-03 (×2): qty 1

## 2015-10-03 MED ORDER — POTASSIUM CHLORIDE CRYS ER 20 MEQ PO TBCR
40.0000 meq | EXTENDED_RELEASE_TABLET | Freq: Once | ORAL | Status: AC
  Administered 2015-10-03: 40 meq via ORAL
  Filled 2015-10-03: qty 2

## 2015-10-03 NOTE — Discharge Instructions (Signed)

## 2015-10-03 NOTE — Progress Notes (Signed)
Cardiologist: Dr. Irish Lack Subjective:   Transient temp overnight again. Brief AFIB episode (160). He says his heart stopped (tele OK).  IM team following.  No CP, no SOB. Mild cough, sputum. ?bronchitis.   Cath yesterday - occluded SVG to OM. No PCI Objective:  Vital Signs in the last 24 hours: Temp:  [99 F (37.2 C)-101.8 F (38.8 C)] 99.1 F (37.3 C) (12/22 0542) Pulse Rate:  [55-91] 89 (12/22 0542) Resp:  [18-20] 20 (12/22 0542) BP: (132-181)/(60-88) 165/76 mmHg (12/22 0542) SpO2:  [94 %-96 %] 96 % (12/22 0542)  Intake/Output from previous day: 12/21 0701 - 12/22 0700 In: 483 [P.O.:480; I.V.:3] Out: 25 [Urine:25]   Physical Exam: General: Well developed,elderly, in no acute distress. Head:  Normocephalic and atraumatic. Lungs: Clear to auscultation and percussion. Heart: Normal S1 and S2.  No murmur, rubs or gallops.  Abdomen: soft, non-tender, positive bowel sounds. Extremities: No clubbing or cyanosis. No edema. Neurologic: Alert and oriented x 3.    Lab Results:  Recent Labs  10/01/15 1415 10/03/15 0615  WBC 8.0 6.3  HGB 12.3* 11.8*  PLT 247 255    Recent Labs  10/01/15 0505 10/01/15 1415 10/03/15 0615  NA 138  --  135  K 3.6  --  3.3*  CL 105  --  98*  CO2 24  --  26  GLUCOSE 124*  --  143*  BUN 15  --  12  CREATININE 0.82 0.84 0.81   Imaging: Dg Chest 2 View  10/02/2015  CLINICAL DATA:  Fever EXAM: CHEST  2 VIEW COMPARISON:  Chest radiograph from one day prior. FINDINGS: Stable configuration of median sternotomy wires. Stable cardiomediastinal silhouette with top-normal heart size. No pneumothorax. Trace bilateral pleural effusions. No overt pulmonary edema. Mild patchy opacity in both lower lobes, not appreciably changed. IMPRESSION: 1. Nonspecific stable mild patchy bilateral lower lobe opacities, likely atelectasis, cannot exclude pneumonia or aspiration. 2. Trace bilateral pleural effusions. Electronically Signed   By: Ilona Sorrel M.D.    On: 10/02/2015 15:36   Dg Chest Port 1 View  10/01/2015  CLINICAL DATA:  Fever and shortness of Breath EXAM: PORTABLE CHEST - 1 VIEW COMPARISON:  09/28/2015 FINDINGS: Cardiac shadow is stable. Postoperative changes are seen. Increased vascular congestion with interstitial edema is noted. Slight increased density is noted in the medial right lung base which may represent some very early infiltrate. No bony abnormality noted. IMPRESSION: Increased interstitial edema. Question early right basilar infiltrate. Electronically Signed   By: Inez Catalina M.D.   On: 10/01/2015 13:59  Personally viewed  Telemetry: Episodes of rapid AFIB, now NSR Personally viewed.   EKG:   NSR 79 Personally viewed.  Cardiac Studies:  NUC - lateral ischemia. EF 42%  Meds: Scheduled Meds: . azithromycin  500 mg Oral Daily  . carvedilol  12.5 mg Oral BID WC  . cefTRIAXone (ROCEPHIN)  IV  1 g Intravenous Q24H  . insulin aspart  0-9 Units Subcutaneous TID WC  . isosorbide mononitrate  30 mg Oral Daily  . rivaroxaban  20 mg Oral Q supper  . sodium chloride  3 mL Intravenous Q12H  . sodium chloride  3 mL Intravenous Q12H   Continuous Infusions:   PRN Meds:.sodium chloride, acetaminophen **OR** acetaminophen, dextromethorphan-guaiFENesin, levalbuterol, sodium chloride  Assessment/Plan:  Principal Problem:   Sepsis ([place]) Active Problems:   Coronary atherosclerosis of native coronary artery   Essential hypertension, benign   Mixed hyperlipidemia   Fever   SOB (shortness of  breath)   Atrial fibrillation with RVR (HCC)   Diabetes mellitus without complication (HCC)   Hypoxia   Typical atrial flutter (HCC)   S/P CABG x 4   Abnormal nuclear stress test - Intermediate Risk, Lateral ischemia, EF 42%  Demand ischemia  - Trop low level elevation  - Stress test abnormal, lateral ischemia  - EF 42% on NUC  - cath 12/20 with occluded SVG - OM. NO PCI. Med mgt  PSVT/ PAF  - AFIB after adenosine  - CHADS-VASc 4  -  Starting Xarelto 20mg  PO QD (wife takes). Discussed risks, bleeding.   - changed metoprolol to 50 BID to coreg 12.5 BID which we will now increase to 25mg  BID (needs improved BP control). He was worried that metoprolol caused him to pass out after his CABG. Does not want to take it.   - States that 9 hours after taking high dose Losartan that his heart would race.   - Imdur started - good choice with CAD.   Fever  - recent fevers, now AFeb  - Ceftriaxone per primary team  - no PNA on CT scan.   - empiric tx for bronchitis. ? develp infiltrate on xray  CAD  - post CABG ten years ago  - 2006, (LIMA to LAD, SVG to PDA, SVG to OM1 and OM2)  - Occluded OM graft - med mgt.   Dyspnea  - improved (main complaint coming in)  - EF on ECHO 70%, on NUC 42%.   Essential hypertension  - off ARB - he thinks this is the reason why he has AFIB.  - maybe coreg will be more efficaciaous.   - states that his BP can be labile  - coreg increase, imdur start.      Jaleeya Mcnelly, [place] 10/03/2015, 1:13 PM

## 2015-10-03 NOTE — Progress Notes (Signed)
Pt copay will be $45- prior auth not required for xarelto per benefit check

## 2015-10-03 NOTE — Progress Notes (Signed)
[facility] PROGRESS NOTE  Jonathan [name] [id number] DOB: 1929-04-06 DOA: 09/28/2015 PCP: Irven Shelling, MD  Length of stay: 5   Assessment/Plan: Principal Problem:   Sepsis ([place]) Active Problems:   Coronary atherosclerosis of native coronary artery   Essential hypertension, benign   Mixed hyperlipidemia   Fever   SOB (shortness of breath)   Atrial fibrillation with RVR ([place])   Diabetes mellitus without complication (HCC)   Hypoxia   Typical atrial flutter (HCC)   S/P CABG x 4   Abnormal nuclear stress test - Intermediate Risk, Lateral ischemia, EF 42%    History of present illness 81 y.o.male with known history of CAD, CABG 2006, (LIMA to LAD, SVG to PDA, SVG to OM1 and OM2), Hypertension, hyperlipidemia, and diabetes who was admitted with dyspnea and palpitations.  EMS brought patient to ER and noted that he was initally in SVT, treated with adenosine 6 mg and 12 mg. HR slowed to atrial fib. He was treated with IV diltiazem 10 mg in ER for HR of 130 bpm. and he converted to NSR. Vital signs in ER, BP 117/57, HR 109, O2 Sat 87%, febrile at 101.4. Flu swab was negative for influenza.  Labs demonstrated troponin of 0.04; 0.34. WBC 8.8, Hgb 12.2, HCT 35.8; Glucose 209. K+ CXR ateletasis in the lung bases. Mild vascular congestion. He was treated also with insulin, ECASA, Xopenex, and heparin. EKG NSR with no acute ST-T wave changes. We are asked to see patient concerning his atrial arrhythmia, positive troponin and questionable CHF.   Patient states that he began to have problems about 18 months ago with his BP. He states that he was on Cozaar, but by the afternoon his BP would be too low and his HR would be racing. His PCP changed him to losartan and made it 25 mg TID about 5 days ago. He was feeling ok until last night. He awoke extremely dyspneic.     Assessment and plan  Sepsis ([place]) -Evidenced by temperature of 102.3, heart rate 122, respiratory  rate of 25  -Suspected pulmonary source as he presented with SOB with oxygen desaturation -CXR/CT chest is negative. Influenza PCR negative -Patient initiated on empiricbroad-spectrum antibiotics48 hours,blood culture, urine culture negative, DC'd  vancomycin/Zosyn, now on  Rocephin, azithromycin for cough, possible acute bronchitis -Repeat chest x-ray performed on 10/02/2015 showing stable findings. Patchy lower extremity opacities likely secondary to atelectasis. -On 10/02/2015 he again spiked a temperature of 101.8. On exam. Appears nontoxic and nonfocal. Will continue empiric antibiotic coverage with ceftriaxone and azithromycin since he continues to spike temperatures.   SOB: Likely secondary to arrhythmia, NSR with MAT,abnormal troponin,- echo, lexiscan myoview abnormal, EF 42% -Given treated with antibiotic therapy for possible acute bronchitis.  CAD: s/p of CABG 10 years ago. No chest pain. But presents with new onset Afib with RVR - continue ASA - cycle CE q6 x3 and repeat her EKG in the am  Abnormal Lexiscan Myoview   -Cardiac Cath performed on 10/01/2015 showing severe 3 vessel obstructive CAD, medical therapy. No suitable targets for PCI  Paroxysmal atrial fibrillation: CHA2DS2-VASc Score is 4, needs oral anticoagulation if become persistent. Cardiology recommended anticoagulation after cath, Cardizem drip DC'd, switched to by mouth metoprolol TSH, Free t4 and t3-within normal limits -Patient did not want to take metoprolol for which this agent was changed to Coreg 12.5 mg by mouth twice a day. Overnight had episode of A. fib with RVR that spontaneously resolved. Case  discussed with cardiology plan to increase his Coreg to 25 mg by mouth twice a day     Essential hypertension, benign: -His blood pressures remain elevated. He refuses ARB therapy stating Cozaar causes his heart to race.  -On 10/03/2015 Cozaar stopped per pt's request. Have started Imdur 30 mg PO q daily -Will  continue monitoring blood pressures.   Diet-controlled DM-II:  -Blood sugars controlled  DVT prophylaxsis: Patient fully anticoagulated  Code Status:      Code Status Orders        Start     Ordered   09/28/15 0609  Full code   Continuous     09/28/15 0609      Family Communication:  I spoke with his son and wife were present at bedside   Disposition Plan:  Added Imdur 30 mg by mouth daily and increasing Coreg dose. Plan to continue antimicrobial therapy as he continues to spike temperatures.    Consultants:  Cardiology  Procedures:  Myoview  Antibiotics: Anti-infectives    Start     Dose/Rate Route Frequency Ordered Stop   09/30/15 1300  cefTRIAXone (ROCEPHIN) 1 g in dextrose 5 % 50 mL IVPB     1 g 100 mL/hr over 30 Minutes Intravenous Every 24 hours 09/30/15 1221     09/30/15 1230  azithromycin (ZITHROMAX) tablet 500 mg     500 mg Oral Daily 09/30/15 1221     09/28/15 1700  vancomycin (VANCOCIN) IVPB 1000 mg/200 mL premix  Status:  Discontinued     1,000 mg 200 mL/hr over 60 Minutes Intravenous Every 12 hours 09/28/15 0708 09/30/15 1229   09/28/15 1200  piperacillin-tazobactam (ZOSYN) IVPB 3.375 g  Status:  Discontinued     3.375 g 12.5 mL/hr over 240 Minutes Intravenous 3 times per day 09/28/15 0708 09/30/15 1229   09/28/15 0330  piperacillin-tazobactam (ZOSYN) IVPB 3.375 g     3.375 g 100 mL/hr over 30 Minutes Intravenous  Once 09/28/15 0315 09/28/15 0529   09/28/15 0330  vancomycin (VANCOCIN) IVPB 1000 mg/200 mL premix     1,000 mg 200 mL/hr over 60 Minutes Intravenous  Once 09/28/15 0315 09/28/15 0853         HPI/Subjective: Low-grade fever last night, no cough  ,No CP, no SOB  Objective: Filed Vitals:   10/03/15 0415 10/03/15 0417 10/03/15 0542 10/03/15 1350  BP: 181/88 174/83 165/76 134/67  Pulse: 91 90 89 75  Temp: 99 F (37.2 C)  99.1 F (37.3 C) 100.5 F (38.1 C)  TempSrc: Oral  Oral Oral  Resp: 18  20 16   Height:      Weight:       SpO2: 96%  96% 96%    Intake/Output Summary (Last 24 hours) at 10/03/15 1611 Last data filed at 10/03/15 0730  Gross per 24 hour  Intake    120 ml  Output      0 ml  Net    120 ml    Exam:  General: Well developed,elderly, in no acute distress. Head: Normocephalic and atraumatic. Lungs: Clear to auscultation and percussion. Heart: Normal S1 and S2. No murmur, rubs or gallops.  Abdomen: soft, non-tender, positive bowel sounds. Extremities: No clubbing or cyanosis. No edema.  Neurologic: Alert and oriented x 3.   Data Review   Micro Results Recent Results (from the past 240 hour(s))  Blood Culture (routine x 2)     Status: None   Collection Time: 09/28/15  3:38 AM  Result Value Ref  Range Status   Specimen Description BLOOD RIGHT ARM  Final   Special Requests BOTTLES DRAWN AEROBIC AND ANAEROBIC 10ML  Final   Culture NO GROWTH 5 DAYS  Final   Report Status 10/03/2015 FINAL  Final  Blood Culture (routine x 2)     Status: None   Collection Time: 09/28/15  3:43 AM  Result Value Ref Range Status   Specimen Description BLOOD LEFT ARM  Final   Special Requests BOTTLES DRAWN AEROBIC AND ANAEROBIC 10ML  Final   Culture NO GROWTH 5 DAYS  Final   Report Status 10/03/2015 FINAL  Final  Urine culture     Status: None   Collection Time: 09/28/15  5:51 AM  Result Value Ref Range Status   Specimen Description URINE, RANDOM  Final   Special Requests NONE  Final   Culture NO GROWTH 1 DAY  Final   Report Status 09/29/2015 FINAL  Final  MRSA PCR Screening     Status: None   Collection Time: 09/28/15  1:36 PM  Result Value Ref Range Status   MRSA by PCR NEGATIVE NEGATIVE Final    Comment:        The GeneXpert MRSA Assay (FDA approved for NASAL specimens only), is one component of a comprehensive MRSA colonization surveillance program. It is not intended to diagnose MRSA infection nor to guide or monitor treatment for MRSA infections.   Culture, blood (single)     Status:  None (Preliminary result)   Collection Time: 10/01/15  9:06 PM  Result Value Ref Range Status   Specimen Description BLOOD LEFT ANTECUBITAL  Final   Special Requests IN PEDIATRIC BOTTLE 3CC  Final   Culture NO GROWTH 2 DAYS  Final   Report Status PENDING  Incomplete    Radiology Reports Dg Chest 2 View  10/02/2015  CLINICAL DATA:  Fever EXAM: CHEST  2 VIEW COMPARISON:  Chest radiograph from one day prior. FINDINGS: Stable configuration of median sternotomy wires. Stable cardiomediastinal silhouette with top-normal heart size. No pneumothorax. Trace bilateral pleural effusions. No overt pulmonary edema. Mild patchy opacity in both lower lobes, not appreciably changed. IMPRESSION: 1. Nonspecific stable mild patchy bilateral lower lobe opacities, likely atelectasis, cannot exclude pneumonia or aspiration. 2. Trace bilateral pleural effusions. Electronically Signed   By: Ilona Sorrel M.D.   On: 10/02/2015 15:36   Dg Chest 2 View  09/28/2015  CLINICAL DATA:  Fever, shortness of breath, and cough for 2 days. EXAM: CHEST  2 VIEW COMPARISON:  04/20/2005 FINDINGS: Postoperative changes in the mediastinum. Shallow inspiration with atelectasis in the lung bases. No focal consolidation. Mild cardiac enlargement. Mild vascular congestion. No edema. No blunting of costophrenic angles. No pneumothorax. Calcified and tortuous aorta. Degenerative changes in the spine and shoulders. IMPRESSION: Shallow inspiration with atelectasis in the lung bases. Cardiac enlargement with mild vascular congestion. No focal consolidation or edema. Electronically Signed   By: Lucienne Capers M.D.   On: 09/28/2015 03:37   Ct Chest Wo Contrast  09/28/2015  CLINICAL DATA:  Shortness of breath. Past medical history of hypertension, coronary artery disease, palpitations, hyperlipidemia. EXAM: CT CHEST WITHOUT CONTRAST TECHNIQUE: Multidetector CT imaging of the chest was performed following the standard protocol without IV contrast.  COMPARISON:  None. FINDINGS: Heart size is upper normal. No pericardial effusion. Coronary artery calcifications noted. Patient is status post CABG. Scattered atherosclerotic changes noted along the walls of the normal-caliber thoracic aorta. Scattered small lymph nodes within the mediastinum. No mass or enlarged lymph nodes  identified within the mediastinum or perihilar regions. Mild scarring/atelectasis noted at each lung base. Trace pleural effusions versus pleural thickening at each lung base. Lungs otherwise clear. No evidence of pneumonia. No evidence of congestive heart failure. No pneumothorax. Trachea and central bronchi are unremarkable. Stone within the gallbladder measures at least 1.8 cm diameter. Limited images of the upper abdomen are otherwise unremarkable. Mild degenerative change noted throughout the thoracic spine. No acute appearing osseous abnormality IMPRESSION: 1. No evidence of acute intrathoracic abnormality. No evidence of pneumonia. No significant pleural effusion. Heart size is upper normal. No evidence of congestive heart failure. 2. Mild atelectasis/scarring at each lung base. Trace pleural effusions, versus more likely chronic pleural thickening, at each lung base. 3. Cholelithiasis, incompletely imaged, without obvious evidence of acute cholecystitis. Electronically Signed   By: Franki Cabot M.D.   On: 09/28/2015 22:49   Nm Myocar Multi W/spect W/wall Motion / Ef  09/29/2015  CLINICAL DATA:  Shortness of breath beginning Friday and Saturday, history diabetes mellitus, hypertension, hyperlipidemia, coronary artery disease post CABG EXAM: MYOCARDIAL IMAGING WITH SPECT (REST AND PHARMACOLOGIC-STRESS) GATED LEFT VENTRICULAR WALL MOTION STUDY LEFT VENTRICULAR EJECTION FRACTION TECHNIQUE: Standard myocardial SPECT imaging was performed after resting intravenous injection of 10 mCi Tc-74m sestamibi. Subsequently, intravenous infusion of Lexiscan was performed under the supervision of  the Cardiology staff. At peak effect of the drug, 30 mCi Tc-33m sestamibi was injected intravenously and standard myocardial SPECT imaging was performed. Quantitative gated imaging was also performed to evaluate left ventricular wall motion, and estimate left ventricular ejection fraction. COMPARISON:  None. FINDINGS: Perfusion: Moderate-sized area of decreased myocardial perfusion at the lateral wall of the LEFT ventricle on image its obtained following pharmacologic stress. Reperfusion of this zone on resting images. No pulmonary uptake of tracer. Wall Motion: Normal left ventricular wall motion. No left ventricular dilation. Left Ventricular Ejection Fraction: 42 % End diastolic volume 87 ml End systolic volume 51 ml IMPRESSION: 1. Lexiscan-induced reversible decreased myocardial perfusion of the lateral wall of the LEFT ventricle. 2. Normal left ventricular wall motion. 3. Left ventricular ejection fraction 42% 4. Intermediate-risk stress test findings*. *2012 Appropriate Use Criteria for Coronary Revascularization Focused Update: J Am Coll Cardiol. [id number]. http://content.[email or web address].aspx?articleid=[email or web address] Electronically Signed   By: Lavonia Dana M.D.   On: 09/29/2015 12:44   Dg Chest Port 1 View  10/01/2015  CLINICAL DATA:  Fever and shortness of Breath EXAM: PORTABLE CHEST - 1 VIEW COMPARISON:  09/28/2015 FINDINGS: Cardiac shadow is stable. Postoperative changes are seen. Increased vascular congestion with interstitial edema is noted. Slight increased density is noted in the medial right lung base which may represent some very early infiltrate. No bony abnormality noted. IMPRESSION: Increased interstitial edema. Question early right basilar infiltrate. Electronically Signed   By: Inez Catalina M.D.   On: 10/01/2015 13:59     CBC  Recent Labs Lab 09/28/15 0343 09/29/15 0337 09/30/15 0445 10/01/15 0505 10/01/15 1415 10/03/15 0615  WBC 8.8 6.0 5.7 7.0 8.0 6.3  HGB 12.2*  10.4* 11.0* 11.0* 12.3* 11.8*  HCT 35.8* 30.6* 32.1* 32.3* 36.9* 34.1*  PLT 203 190 193 217 247 255  MCV 82.5 82.9 81.9 81.6 81.6 81.2  MCH 28.1 28.2 28.1 27.8 27.2 28.1  MCHC 34.1 34.0 34.3 34.1 33.3 34.6  RDW 13.6 13.8 13.5 13.5 13.3 13.4  LYMPHSABS 0.5*  --   --   --   --   --   MONOABS 1.1*  --   --   --   --   --  EOSABS 0.0  --   --   --   --   --   BASOSABS 0.0  --   --   --   --   --     Chemistries   Recent Labs Lab 09/28/15 0343 09/29/15 0337 09/30/15 0445 10/01/15 0505 10/01/15 1415 10/03/15 0615  NA 136 136 138 138  --  135  K 4.2 4.1 3.8 3.6  --  3.3*  CL 106 105 104 105  --  98*  CO2 22 26 26 24   --  26  GLUCOSE 209* 134* 147* 124*  --  143*  BUN 21* 17 14 15   --  12  CREATININE 0.86 0.94 0.95 0.82 0.84 0.81  CALCIUM 8.0* 7.8* 7.9* 8.3*  --  8.0*  MG 1.8  --   --   --   --   --   AST 16 19 21   --   --   --   ALT 14* 18 19  --   --   --   ALKPHOS 59 51 51  --   --   --   BILITOT 0.6 0.8 0.8  --   --   --    ------------------------------------------------------------------------------------------------------------------ estimated creatinine clearance is 63.5 mL/min (by C-G formula based on Cr of 0.81). ------------------------------------------------------------------------------------------------------------------ No results for input(s): HGBA1C in the last 72 hours. ------------------------------------------------------------------------------------------------------------------ No results for input(s): CHOL, HDL, LDLCALC, TRIG, CHOLHDL, LDLDIRECT in the last 72 hours. ------------------------------------------------------------------------------------------------------------------ No results for input(s): TSH, T4TOTAL, T3FREE, THYROIDAB in the last 72 hours.  Invalid input(s): FREET3 ------------------------------------------------------------------------------------------------------------------ No results for input(s): VITAMINB12, FOLATE, FERRITIN,  TIBC, IRON, RETICCTPCT in the last 72 hours.  Coagulation profile  Recent Labs Lab 09/28/15 0615 10/01/15 0505  INR 1.21 1.23    No results for input(s): DDIMER in the last 72 hours.  Cardiac Enzymes  Recent Labs Lab 09/28/15 1832 09/28/15 2140 09/29/15 0337  TROPONINI 0.42* 0.37* 0.26*   ------------------------------------------------------------------------------------------------------------------ Invalid input(s): POCBNP   CBG:  Recent Labs Lab 10/02/15 1148 10/02/15 1653 10/02/15 2200 10/03/15 0744 10/03/15 1207  GLUCAP 135* 119* 175* 137* 153*       Studies: Dg Chest 2 View  10/02/2015  CLINICAL DATA:  Fever EXAM: CHEST  2 VIEW COMPARISON:  Chest radiograph from one day prior. FINDINGS: Stable configuration of median sternotomy wires. Stable cardiomediastinal silhouette with top-normal heart size. No pneumothorax. Trace bilateral pleural effusions. No overt pulmonary edema. Mild patchy opacity in both lower lobes, not appreciably changed. IMPRESSION: 1. Nonspecific stable mild patchy bilateral lower lobe opacities, likely atelectasis, cannot exclude pneumonia or aspiration. 2. Trace bilateral pleural effusions. Electronically Signed   By: Ilona Sorrel M.D.   On: 10/02/2015 15:36      Lab Results  Component Value Date   HGBA1C 8.1* 09/28/2015   Lab Results  Component Value Date   LDLCALC 112* 09/29/2015   CREATININE 0.81 10/03/2015       Scheduled Meds: . azithromycin  500 mg Oral Daily  . carvedilol  25 mg Oral BID WC  . cefTRIAXone (ROCEPHIN)  IV  1 g Intravenous Q24H  . insulin aspart  0-9 Units Subcutaneous TID WC  . isosorbide mononitrate  30 mg Oral Daily  . rivaroxaban  20 mg Oral Q supper  . sodium chloride  3 mL Intravenous Q12H  . sodium chloride  3 mL Intravenous Q12H   Continuous Infusions:    Principal Problem:   Sepsis ([place]) Active Problems:   Coronary atherosclerosis of native  coronary artery   Essential hypertension,  benign   Mixed hyperlipidemia   Fever   SOB (shortness of breath)   Atrial fibrillation with RVR (HCC)   Diabetes mellitus without complication (HCC)   Hypoxia   Typical atrial flutter (HCC)   S/P CABG x 4   Abnormal nuclear stress test - Intermediate Risk, Lateral ischemia, EF 42%    Time spent: 25 minutes   Kelvin Cellar  [facility] Pager [phone]. If 7PM-7AM, please contact night-coverage at [email or web address], password [facility] 10/03/2015, 4:11 PM  LOS: 5 days

## 2015-10-03 NOTE — Care Management Note (Addendum)
Case Management Note  Patient Details  Name: Jonathan [name] MRN: [id number] Date of Birth: December 26, 1928  Subjective/Objective:   Pt admitted with sepsis, SOB.  Started on Xarelto due to new onset Afib.PMH: HTN, HLD, DM, CAD s/p CABG, back pain, BPH.   Home with Family is the plan.              Action/Plan: Presented pt, Spouse and adult son with 30 day free coupon for Xarelto. CM has submitted benefits check for ongoing therapy and will relay this info when available. Discussed importance of daily therapy and costs, as well as, cost effectiveness of Xarelto vs Coumadin and INR Blood Draws with Office copays. CM reinforced that these alternatives could be discussed at PCP visit but no changes needed to be made without advisement of PCP. All were in agreement.    Expected Discharge Date:                  Expected Discharge Plan:     In-House Referral:     Discharge planning Services  CM Consult  Post Acute Care Choice:    Choice offered to:     DME Arranged:    DME Agency:     HH Arranged:    HH Agency:     Status of Service:  In process, will continue to follow  Medicare Important Message Given:  Yes Date Medicare IM Given:    Medicare IM give by:    Date Additional Medicare IM Given:    Additional Medicare Important Message give by:     If discussed at [place] of Stay Meetings, dates discussed:    Additional Comments:  Delrae Sawyers, RN 10/03/2015, 11:46 AM

## 2015-10-04 DIAGNOSIS — B349 Viral infection, unspecified: Secondary | ICD-10-CM

## 2015-10-04 LAB — BASIC METABOLIC PANEL
ANION GAP: 7 (ref 5–15)
BUN: 15 mg/dL (ref 6–20)
CO2: 27 mmol/L (ref 22–32)
Calcium: 8.3 mg/dL — ABNORMAL LOW (ref 8.9–10.3)
Chloride: 103 mmol/L (ref 101–111)
Creatinine, Ser: 0.89 mg/dL (ref 0.61–1.24)
GLUCOSE: 208 mg/dL — AB (ref 65–99)
POTASSIUM: 3.9 mmol/L (ref 3.5–5.1)
SODIUM: 137 mmol/L (ref 135–145)

## 2015-10-04 LAB — GLUCOSE, CAPILLARY
GLUCOSE-CAPILLARY: 124 mg/dL — AB (ref 65–99)
GLUCOSE-CAPILLARY: 144 mg/dL — AB (ref 65–99)

## 2015-10-04 MED ORDER — ISOSORBIDE MONONITRATE ER 30 MG PO TB24: 30.0000 mg | ORAL_TABLET | Freq: Every day | ORAL | Status: DC

## 2015-10-04 MED ORDER — RIVAROXABAN 20 MG PO TABS: 20.0000 mg | ORAL_TABLET | Freq: Every day | ORAL | Status: DC

## 2015-10-04 MED ORDER — CARVEDILOL 25 MG PO TABS: 25.0000 mg | ORAL_TABLET | Freq: Two times a day (BID) | ORAL | Status: DC

## 2015-10-04 NOTE — Progress Notes (Signed)
Cardiologist: Dr. Irish Lack Subjective:  Improved. No CP, noSOB  Objective:  Vital Signs in the last 24 hours: Temp:  [97.4 F (36.3 C)-100.5 F (38.1 C)] 97.4 F (36.3 C) (12/23 1325) Pulse Rate:  [71-85] 73 (12/23 1325) Resp:  [18-22] 22 (12/23 1325) BP: (121-142)/(54-66) 136/61 mmHg (12/23 1325) SpO2:  [94 %-99 %] 97 % (12/23 1325)  Intake/Output from previous day: 12/22 0701 - 12/23 0700 In: 240 [P.O.:240] Out: 350 [Urine:350]   Physical Exam: General: Well developed,elderly, in no acute distress. Head:  Normocephalic and atraumatic. Lungs: Clear to auscultation and percussion. Heart: Normal S1 and S2.  No murmur, rubs or gallops.  Abdomen: soft, non-tender, positive bowel sounds. Extremities: No clubbing or cyanosis. No edema. Neurologic: Alert and oriented x 3.    Lab Results:  Recent Labs  10/03/15 0615  WBC 6.3  HGB 11.8*  PLT 255    Recent Labs  10/03/15 0615 10/04/15 0921  NA 135 137  K 3.3* 3.9  CL 98* 103  CO2 26 27  GLUCOSE 143* 208*  BUN 12 15  CREATININE 0.81 0.89   Imaging: Dg Chest 2 View  10/02/2015  CLINICAL DATA:  Fever EXAM: CHEST  2 VIEW COMPARISON:  Chest radiograph from one day prior. FINDINGS: Stable configuration of median sternotomy wires. Stable cardiomediastinal silhouette with top-normal heart size. No pneumothorax. Trace bilateral pleural effusions. No overt pulmonary edema. Mild patchy opacity in both lower lobes, not appreciably changed. IMPRESSION: 1. Nonspecific stable mild patchy bilateral lower lobe opacities, likely atelectasis, cannot exclude pneumonia or aspiration. 2. Trace bilateral pleural effusions. Electronically Signed   By: Ilona Sorrel M.D.   On: 10/02/2015 15:36  Personally viewed  Telemetry: Episodes of rapid AFIB previously, now NSR Personally viewed.   EKG:   NSR 79 Personally viewed.  Cardiac Studies:  NUC - lateral ischemia. EF 42%  Meds: Scheduled Meds: . azithromycin  500 mg Oral Daily  .  carvedilol  25 mg Oral BID WC  . cefTRIAXone (ROCEPHIN)  IV  1 g Intravenous Q24H  . insulin aspart  0-9 Units Subcutaneous TID WC  . isosorbide mononitrate  30 mg Oral Daily  . rivaroxaban  20 mg Oral Q supper  . sodium chloride  3 mL Intravenous Q12H  . sodium chloride  3 mL Intravenous Q12H   Continuous Infusions:   PRN Meds:.sodium chloride, acetaminophen **OR** acetaminophen, dextromethorphan-guaiFENesin, levalbuterol, sodium chloride  Assessment/Plan:  Principal Problem:   Sepsis ([place]) Active Problems:   Coronary atherosclerosis of native coronary artery   Essential hypertension, benign   Mixed hyperlipidemia   Fever   SOB (shortness of breath)   Atrial fibrillation with RVR (HCC)   Diabetes mellitus without complication (HCC)   Hypoxia   Typical atrial flutter (HCC)   S/P CABG x 4   Abnormal nuclear stress test - Intermediate Risk, Lateral ischemia, EF 42%  Demand ischemia  - Trop low level elevation  - Stress test abnormal, lateral ischemia  - EF 42% on NUC  - cath 12/20 with occluded SVG - OM. NO PCI. Med mgt  PSVT/ PAF  - AFIB after adenosine  - CHADS-VASc 4  - Starting Xarelto 20mg  PO QD (wife takes). Discussed risks, bleeding.   - changed metoprolol to 50 BID to coreg 12.5 BID which we will now increase to 25mg  BID (needs improved BP control). He was worried that metoprolol caused him to pass out after his CABG. Does not want to take it.   - States that  9 hours after taking high dose Losartan that his heart would race.   - Imdur started - good choice with CAD.   Fever  - recent fevers, now AFeb  - Ceftriaxone per primary team  - no PNA on CT scan.   - empiric tx for bronchitis. ? develp infiltrate on xray  CAD  - post CABG ten years ago  - 2006, (LIMA to LAD, SVG to PDA, SVG to OM1 and OM2)  - Occluded OM graft - med mgt.   Dyspnea  - improved (main complaint coming in)  - EF on ECHO 70%, on NUC 42%.   Essential hypertension  - off ARB - he thinks  this is the reason why he has AFIB.  - maybe coreg will be more efficaciaous.   - states that his BP can be labile  - coreg increase, imdur start.    He is going home. Discussed with him. Encouraged him to be positive about new medication.   SKAINS, [place] 10/04/2015, 2:28 PM

## 2015-10-04 NOTE — Care Management Note (Signed)
Case Management Note  Patient Details  Name: [name] MRN: [id number] Date of Birth: Oct 04, 1929  Subjective/Objective:   Patient states  He would like MD to electronically send scripts to pharmacy so they will be ready to pick up when he is discharge , [facility] on [address] does have xarelto in stock.              Action/Plan:   Expected Discharge Date:                  Expected Discharge Plan:  Home/Self Care  In-House Referral:     Discharge planning Services  CM Consult, Medication Assistance (Xarelto)  Post Acute Care Choice:    Choice offered to:     DME Arranged:    DME Agency:     HH Arranged:    HH Agency:     Status of Service:  Completed, signed off  Medicare Important Message Given:  Yes Date Medicare IM Given:    Medicare IM give by:    Date Additional Medicare IM Given:    Additional Medicare Important Message give by:     If discussed at [place] of Stay Meetings, dates discussed:    Additional Comments:  Zenon Mayo, RN 10/04/2015, 11:49 AM

## 2015-10-04 NOTE — Progress Notes (Signed)
Pt and wife given discharge instructions, prescriptions, and care notes. Pt verbalized understanding AEB no further questions or concerns at this time. IV was discontinued, no redness, pain, or swelling noted at this time. Telemetry discontinued and Centralized Telemetry was notified. Pt left the floor via wheelchair with staff in stable condition.

## 2015-10-04 NOTE — Discharge Summary (Signed)
Physician Discharge Summary  [name] [id number] DOB: [birth year] DOA: 09/28/2015  PCP: Jonathan Shelling, MD  Admit date: 09/28/2015 Discharge date: 10/04/2015  Time spent: 35 minutes  Recommendations for Outpatient Follow-up:  1. Please follow-up on blood pressures, he was hypertensive during this hospitalization, discharged on Coreg and Imdur.  2. He was discharged on anticoagulation with Xarelto for A. fib. 3. Febrile illness felt to be secondary to viral infection. By day of discharge he was coming down. Please follow-up   Discharge Diagnoses:  Principal Problem:   Sepsis ([place]) Active Problems:   Coronary atherosclerosis of native coronary artery   Essential hypertension, benign   Mixed hyperlipidemia   Fever   SOB (shortness of breath)   Atrial fibrillation with RVR (HCC)   Diabetes mellitus without complication (HCC)   Hypoxia   Typical atrial flutter (HCC)   S/P CABG x 4   Abnormal nuclear stress test - Intermediate Risk, Lateral ischemia, EF 42%   Discharge Condition: Stable  Diet recommendation: Heart healthy  Filed Weights   09/28/15 1336 10/01/15 0600 10/01/15 1554  Weight: 80.2 kg (176 lb 12.9 oz) 78.8 kg (173 lb 11.6 oz) 79.2 kg (174 lb 9.7 oz)    History of present illness:  Jonathan [name] is a 81 y.o. male with PMH of hypertension, hyperlipidemia, diet-controlled diabetes, CAD, S/P CABG pain years ago, back pain, BPH, who presents with shortness of breath, palpitation and fever.  Pt reports that he suddenly started having SOB and palpitation in the midnight. He did not have chest pain, cough, runny nose or sore throat. Per EMS, pt was in SVT initially and was given 6 mg Adenosine to no relief. He was then given 12 mg Adenosine, which slowed his HR to show A-Fib. 10 mg Cardizem was given following the Adenosine which showed conversion to ST with premature beats. Per triage note, the pt was in A-Fib in 130s upon arrival. He had oxygen  desaturation to 87%, which improved to 96% on nasal cannula.  When I saw pt in ED, he is sweating. His HR is 130 to 140. His shortness of breath has resolved. He does not have chest pain, cough, tenderness over calf areas, abdominal pain, diarrhea, unilateral weakness. He denies dysuria or burning on urination, but states that he has increased urinary frequency for 18 months. His PCP is currently making adjustment for his blood pressure medication, losartan. Pt states that he is on 25 mg Losartan 3x a day and has been for over 2 years. He reports that it has been a "trial and error" process where he will experience "heart beat skips" about 9 hours after taking the medication, particularly if his blood pressure drops to about 100.  In ED, patient was found to have temperature 101.4, tachycardia, lactate 0.82, negative troponin, WBC 8.8, electrolytes and renal function okay, negative chest x-ray for acute abnormalities. Patient is admitted to inpatient for further eval urgent treatment.  Hospital Course:  Mr Jonathan [name] is an 81 year old gentleman with a history of coronary artery disease status post coronary artery bypass grafting, paroxysmal atrial fibrillation, admitted to the medicine service on 09/28/2015 when he presented with complaints of shortness of breath, palpitations and fever. He was felt to be septic evidence by temperature 102.3, heart rate of 122, respiratory rate of 25. With his history of cough and shortness of breath source of infection was suspected to be pulmonary. Influenza PCR was negative. Chest x-ray and CT scan of lungs do not show infiltrate. He  was treated with empiric IV antimicrobial therapy initially with vancomycin and Zosyn and then changed to ceftriaxone and azithromycin for atypical coverage and to cover acute bronchitis. He had a repeat chest x-ray on 10/02/2015 that was stable. His fevers gradually came down. Blood cultures drawn during this hospitalization remained  negative. Other issues addressed during this hospitalization include paroxysmal atrial fibrillation. Having a CHADSVasc score of 4 he was started on oral anticoagulation with Xarelto. During this hospitalization he had an abnormal stress test for which she was taken to the cath lab on 10/01/2015 that showed severe 3 vessel obstructive coronary artery disease, cardiology recommending ongoing medical therapy as there were no suitable targets for percutaneous intervention. By 10/04/2015 he reported feeling much better. His sats were in the 90s on room air. Fevers coming down. Blood pressure is better controlled. He was discharged on Coreg 25 mg by mouth twice a day, Imdur 30 mg by mouth daily, please follow-up on blood pressures. He was discharged to his home in stable condition on 10/04/2015.  Procedures:  Transthoracic echocardiogram performed on 09/28/2015 depression: - Left ventricle: The cavity size was normal. There was mild focal basal hypertrophy of the septum. Systolic function was vigorous. The estimated ejection fraction was in the range of 65% to 70%. Wall motion was normal; there were no regional wall motion abnormalities. Doppler parameters are consistent with abnormal left ventricular relaxation (grade 1 diastolic dysfunction). Doppler parameters are consistent with elevated ventricular end-diastolic filling pressure.  Cardiac catheterization performed on 10/01/2015 impression: 1. Severe 3 vessel obstructive CAD 2. Patent LIMA to the LAD 3. Patent SVG to PDA 4. Occluded sequential SVG to the second and third OMs. 5. Mild LV dysfunction.    Consultations:  Cardiology  Discharge Exam: Filed Vitals:   10/04/15 0837 10/04/15 1325  BP: 142/66 136/61  Pulse: 85 73  Temp: 100.5 F (38.1 C) 97.4 F (36.3 C)  Resp:  22   General: He is awake and alert, no acute distress. Head: Normocephalic and atraumatic. Lungs: Clear to auscultation and percussion. Heart:  Normal S1 and S2. No murmur, rubs or gallops.  Abdomen: soft, non-tender, positive bowel sounds. Extremities: No clubbing or cyanosis. No edema.  Neurologic: Alert and oriented x 3.  Discharge Instructions   Discharge Instructions    Call MD for:  difficulty breathing, headache or visual disturbances    Complete by:  As directed      Call MD for:  extreme fatigue    Complete by:  As directed      Call MD for:  hives    Complete by:  As directed      Call MD for:  persistant dizziness or light-headedness    Complete by:  As directed      Call MD for:  persistant nausea and vomiting    Complete by:  As directed      Call MD for:  redness, tenderness, or signs of infection (pain, swelling, redness, odor or green/yellow discharge around incision site)    Complete by:  As directed      Call MD for:  severe uncontrolled pain    Complete by:  As directed      Call MD for:  temperature >100.4    Complete by:  As directed      Call MD for:    Complete by:  As directed      Diet - low sodium heart healthy    Complete by:  As directed  Increase activity slowly    Complete by:  As directed           Current Discharge Medication List    START taking these medications   Details  carvedilol (COREG) 25 MG tablet Take 1 tablet (25 mg total) by mouth 2 (two) times daily with a meal. Qty: 60 tablet, Refills: 1    isosorbide mononitrate (IMDUR) 30 MG 24 hr tablet Take 1 tablet (30 mg total) by mouth daily. Qty: 30 tablet, Refills: 1    rivaroxaban (XARELTO) 20 MG TABS tablet Take 1 tablet (20 mg total) by mouth daily with supper. Qty: 30 tablet, Refills: 1      STOP taking these medications     aspirin EC 81 MG tablet      losartan (COZAAR) 25 MG tablet      cephALEXin (KEFLEX) 500 MG capsule      HYDROmorphone (DILAUDID) 2 MG tablet        Allergies  Allergen Reactions  . Codeine     unknown  . Statins     "pass out, dizzy, drunk feeling, poor balance"    Follow-up Information    Follow up with Jonathan Shelling, MD In 2 weeks.   Specialty:  Internal Medicine   Why:  Left message for doctor office to call patient at home with an appointment    Contact information:   [address] [place] [address] [phone]       Follow up with Jettie Booze., MD In 2 weeks.   Specialties:  Cardiology, Radiology, Interventional Cardiology   Contact information:   [id number] [place] [address] [phone]       Follow up with Jonathan Shelling, MD.   Specialty:  Internal Medicine   Contact information:   [address] [place] [address] [phone]        The results of significant diagnostics from this hospitalization (including imaging, microbiology, ancillary and laboratory) are listed below for reference.    Significant Diagnostic Studies: Dg Chest 2 View  10/02/2015  CLINICAL DATA:  Fever EXAM: CHEST  2 VIEW COMPARISON:  Chest radiograph from one day prior. FINDINGS: Stable configuration of median sternotomy wires. Stable cardiomediastinal silhouette with top-normal heart size. No pneumothorax. Trace bilateral pleural effusions. No overt pulmonary edema. Mild patchy opacity in both lower lobes, not appreciably changed. IMPRESSION: 1. Nonspecific stable mild patchy bilateral lower lobe opacities, likely atelectasis, cannot exclude pneumonia or aspiration. 2. Trace bilateral pleural effusions. Electronically Signed   By: Ilona Sorrel M.D.   On: 10/02/2015 15:36   Dg Chest 2 View  09/28/2015  CLINICAL DATA:  Fever, shortness of breath, and cough for 2 days. EXAM: CHEST  2 VIEW COMPARISON:  04/20/2005 FINDINGS: Postoperative changes in the mediastinum. Shallow inspiration with atelectasis in the lung bases. No focal consolidation. Mild cardiac enlargement. Mild vascular congestion. No edema. No blunting of costophrenic angles. No pneumothorax. Calcified and tortuous  aorta. Degenerative changes in the spine and shoulders. IMPRESSION: Shallow inspiration with atelectasis in the lung bases. Cardiac enlargement with mild vascular congestion. No focal consolidation or edema. Electronically Signed   By: Lucienne Capers M.D.   On: 09/28/2015 03:37   Ct Chest Wo Contrast  09/28/2015  CLINICAL DATA:  Shortness of breath. Past medical history of hypertension, coronary artery disease, palpitations, hyperlipidemia. EXAM: CT CHEST WITHOUT CONTRAST TECHNIQUE: Multidetector CT imaging of the chest was performed following the standard protocol without IV  contrast. COMPARISON:  None. FINDINGS: Heart size is upper normal. No pericardial effusion. Coronary artery calcifications noted. Patient is status post CABG. Scattered atherosclerotic changes noted along the walls of the normal-caliber thoracic aorta. Scattered small lymph nodes within the mediastinum. No mass or enlarged lymph nodes identified within the mediastinum or perihilar regions. Mild scarring/atelectasis noted at each lung base. Trace pleural effusions versus pleural thickening at each lung base. Lungs otherwise clear. No evidence of pneumonia. No evidence of congestive heart failure. No pneumothorax. Trachea and central bronchi are unremarkable. Stone within the gallbladder measures at least 1.8 cm diameter. Limited images of the upper abdomen are otherwise unremarkable. Mild degenerative change noted throughout the thoracic spine. No acute appearing osseous abnormality IMPRESSION: 1. No evidence of acute intrathoracic abnormality. No evidence of pneumonia. No significant pleural effusion. Heart size is upper normal. No evidence of congestive heart failure. 2. Mild atelectasis/scarring at each lung base. Trace pleural effusions, versus more likely chronic pleural thickening, at each lung base. 3. Cholelithiasis, incompletely imaged, without obvious evidence of acute cholecystitis. Electronically Signed   By: Franki Cabot M.D.    On: 09/28/2015 22:49   Nm Myocar Multi W/spect W/wall Motion / Ef  09/29/2015  CLINICAL DATA:  Shortness of breath beginning Friday and Saturday, history diabetes mellitus, hypertension, hyperlipidemia, coronary artery disease post CABG EXAM: MYOCARDIAL IMAGING WITH SPECT (REST AND PHARMACOLOGIC-STRESS) GATED LEFT VENTRICULAR WALL MOTION STUDY LEFT VENTRICULAR EJECTION FRACTION TECHNIQUE: Standard myocardial SPECT imaging was performed after resting intravenous injection of 10 mCi Tc-57m sestamibi. Subsequently, intravenous infusion of Lexiscan was performed under the supervision of the Cardiology staff. At peak effect of the drug, 30 mCi Tc-65m sestamibi was injected intravenously and standard myocardial SPECT imaging was performed. Quantitative gated imaging was also performed to evaluate left ventricular wall motion, and estimate left ventricular ejection fraction. COMPARISON:  None. FINDINGS: Perfusion: Moderate-sized area of decreased myocardial perfusion at the lateral wall of the LEFT ventricle on image its obtained following pharmacologic stress. Reperfusion of this zone on resting images. No pulmonary uptake of tracer. Wall Motion: Normal left ventricular wall motion. No left ventricular dilation. Left Ventricular Ejection Fraction: 42 % End diastolic volume 87 ml End systolic volume 51 ml IMPRESSION: 1. Lexiscan-induced reversible decreased myocardial perfusion of the lateral wall of the LEFT ventricle. 2. Normal left ventricular wall motion. 3. Left ventricular ejection fraction 42% 4. Intermediate-risk stress test findings*. *2012 Appropriate Use Criteria for Coronary Revascularization Focused Update: J Am Coll Cardiol. N6492421. http://content.[email or web address].aspx?articleid=[email or web address] Electronically Signed   By: Lavonia Dana M.D.   On: 09/29/2015 12:44   Dg Chest Port 1 View  10/01/2015  CLINICAL DATA:  Fever and shortness of Breath EXAM: PORTABLE CHEST - 1 VIEW COMPARISON:   09/28/2015 FINDINGS: Cardiac shadow is stable. Postoperative changes are seen. Increased vascular congestion with interstitial edema is noted. Slight increased density is noted in the medial right lung base which may represent some very early infiltrate. No bony abnormality noted. IMPRESSION: Increased interstitial edema. Question early right basilar infiltrate. Electronically Signed   By: Inez Catalina M.D.   On: 10/01/2015 13:59    Microbiology: Recent Results (from the past 240 hour(s))  Blood Culture (routine x 2)     Status: None   Collection Time: 09/28/15  3:38 AM  Result Value Ref Range Status   Specimen Description BLOOD RIGHT ARM  Final   Special Requests BOTTLES DRAWN AEROBIC AND ANAEROBIC 10ML  Final   Culture NO GROWTH 5 DAYS  Final  Report Status 10/03/2015 FINAL  Final  Blood Culture (routine x 2)     Status: None   Collection Time: 09/28/15  3:43 AM  Result Value Ref Range Status   Specimen Description BLOOD LEFT ARM  Final   Special Requests BOTTLES DRAWN AEROBIC AND ANAEROBIC 10ML  Final   Culture NO GROWTH 5 DAYS  Final   Report Status 10/03/2015 FINAL  Final  Urine culture     Status: None   Collection Time: 09/28/15  5:51 AM  Result Value Ref Range Status   Specimen Description URINE, RANDOM  Final   Special Requests NONE  Final   Culture NO GROWTH 1 DAY  Final   Report Status 09/29/2015 FINAL  Final  MRSA PCR Screening     Status: None   Collection Time: 09/28/15  1:36 PM  Result Value Ref Range Status   MRSA by PCR NEGATIVE NEGATIVE Final    Comment:        The GeneXpert MRSA Assay (FDA approved for NASAL specimens only), is one component of a comprehensive MRSA colonization surveillance program. It is not intended to diagnose MRSA infection nor to guide or monitor treatment for MRSA infections.   Culture, blood (single)     Status: None (Preliminary result)   Collection Time: 10/01/15  9:06 PM  Result Value Ref Range Status   Specimen Description  BLOOD LEFT ANTECUBITAL  Final   Special Requests IN PEDIATRIC BOTTLE 3CC  Final   Culture NO GROWTH 3 DAYS  Final   Report Status PENDING  Incomplete     Labs: Basic Metabolic Panel:  Recent Labs Lab 09/28/15 0343 09/29/15 0337 09/30/15 0445 10/01/15 0505 10/01/15 1415 10/03/15 0615 10/04/15 0921  NA 136 136 138 138  --  135 137  K 4.2 4.1 3.8 3.6  --  3.3* 3.9  CL 106 105 104 105  --  98* 103  CO2 22 26 26 24   --  26 27  GLUCOSE 209* 134* 147* 124*  --  143* 208*  BUN 21* 17 14 15   --  12 15  CREATININE 0.86 0.94 0.95 0.82 0.84 0.81 0.89  CALCIUM 8.0* 7.8* 7.9* 8.3*  --  8.0* 8.3*  MG 1.8  --   --   --   --   --   --    Liver Function Tests:  Recent Labs Lab 09/28/15 0343 09/29/15 0337 09/30/15 0445  AST 16 19 21   ALT 14* 18 19  ALKPHOS 59 51 51  BILITOT 0.6 0.8 0.8  PROT 5.7* 5.3* 5.5*  ALBUMIN 3.0* 2.6* 2.8*   No results for input(s): LIPASE, AMYLASE in the last 168 hours. No results for input(s): AMMONIA in the last 168 hours. CBC:  Recent Labs Lab 09/28/15 0343 09/29/15 0337 09/30/15 0445 10/01/15 0505 10/01/15 1415 10/03/15 0615  WBC 8.8 6.0 5.7 7.0 8.0 6.3  NEUTROABS 7.2  --   --   --   --   --   HGB 12.2* 10.4* 11.0* 11.0* 12.3* 11.8*  HCT 35.8* 30.6* 32.1* 32.3* 36.9* 34.1*  MCV 82.5 82.9 81.9 81.6 81.6 81.2  PLT 203 190 193 217 247 255   Cardiac Enzymes:  Recent Labs Lab 09/28/15 0615 09/28/15 1440 09/28/15 1832 09/28/15 2140 09/29/15 0337  TROPONINI 0.04* 0.34* 0.42* 0.37* 0.26*   BNP: BNP (last 3 results)  Recent Labs  09/28/15 1832  BNP 307.3*    ProBNP (last 3 results) No results for input(s): PROBNP in the last  8760 hours.  CBG:  Recent Labs Lab 10/03/15 1207 10/03/15 1643 10/03/15 2054 10/04/15 0751 10/04/15 1149  GLUCAP 153* 124* 148* 124* 144*       Signed:  Kelvin Cellar MD  FACP  [facility] 10/04/2015, 1:29 PM

## 2015-10-04 NOTE — Progress Notes (Signed)
Physical Therapy Treatment Patient Details Name: JEFFERIE MEYERING MRN: [id number] DOB: Feb 25, 1929 Today's Date: 10/31/15    History of Present Illness Pt admitted with sepsis, SOB, possible new onset afib with RVR.  PMH: HTN, HLD, DM, CAD s/p CABG, back pain, BPH.    PT Comments    Pt feeling "good", eager to D/C to home.  Assisted OOB to amb a greater distance with No LOB and No issues.  Follow Up Recommendations  No PT follow up     Equipment Recommendations  None recommended by PT    Recommendations for Other Services       Precautions / Restrictions Precautions Precautions: Fall    Mobility  Bed Mobility Overal bed mobility: Modified Independent             General bed mobility comments: in/out bed  Transfers Overall transfer level: Modified independent Equipment used: 1 person hand held assist Transfers: Sit to/from Stand Sit to Stand: Modified independent (Device/Increase time)         General transfer comment: increased time  Ambulation/Gait Ambulation/Gait assistance: Supervision Ambulation Distance (Feet): 250 Feet Assistive device: None Gait Pattern/deviations: Step-through pattern     General Gait Details: wider base to steady himself.  tolerated increased distance.   Stairs            Wheelchair Mobility    Modified Rankin (Stroke Patients Only)       Balance                                    Cognition Arousal/Alertness: Awake/alert Behavior During Therapy: WFL for tasks assessed/performed Overall Cognitive Status: Within Functional Limits for tasks assessed                      Exercises      General Comments        Pertinent Vitals/Pain Pain Assessment: No/denies pain    Home Living                      Prior Function            PT Goals (current goals can now be found in the care plan section) Progress towards PT goals: Progressing toward goals    Frequency  Min  3X/week    PT Plan Current plan remains appropriate    Co-evaluation             End of Session Equipment Utilized During Treatment: Gait belt Activity Tolerance: Patient tolerated treatment well Patient left: in bed;with call bell/phone within reach;with family/visitor present     Time: [id number] PT Time Calculation (min) (ACUTE ONLY): 12 min  Charges:  $Gait Training: 8-22 mins                    G Codes:      Nathanial Rancher Oct 31, 2015, 12:27 PM

## 2015-10-06 LAB — CULTURE, BLOOD (SINGLE): Culture: NO GROWTH

## 2015-10-17 ENCOUNTER — Encounter: Payer: Self-pay | Admitting: Cardiology

## 2015-10-17 DIAGNOSIS — Z7901 Long term (current) use of anticoagulants: Secondary | ICD-10-CM | POA: Insufficient documentation

## 2015-10-18 ENCOUNTER — Encounter (INDEPENDENT_AMBULATORY_CARE_PROVIDER_SITE_OTHER): Payer: Medicare Other | Admitting: Ophthalmology

## 2015-10-18 DIAGNOSIS — D3131 Benign neoplasm of right choroid: Secondary | ICD-10-CM

## 2015-10-18 DIAGNOSIS — H35033 Hypertensive retinopathy, bilateral: Secondary | ICD-10-CM

## 2015-10-18 DIAGNOSIS — H34831 Tributary (branch) retinal vein occlusion, right eye, with macular edema: Secondary | ICD-10-CM | POA: Diagnosis not present

## 2015-10-18 DIAGNOSIS — I10 Essential (primary) hypertension: Secondary | ICD-10-CM | POA: Diagnosis not present

## 2015-10-18 DIAGNOSIS — H43813 Vitreous degeneration, bilateral: Secondary | ICD-10-CM | POA: Diagnosis not present

## 2015-10-29 ENCOUNTER — Other Ambulatory Visit: Payer: Self-pay | Admitting: Cardiology

## 2015-10-29 ENCOUNTER — Ambulatory Visit (HOSPITAL_COMMUNITY)
Admission: RE | Admit: 2015-10-29 | Discharge: 2015-10-29 | Disposition: A | Discharge status: Home / Self Care | Payer: Medicare Other | Source: Ambulatory Visit | Attending: Vascular Surgery | Admitting: Vascular Surgery

## 2015-10-29 DIAGNOSIS — R42 Dizziness and giddiness: Secondary | ICD-10-CM | POA: Diagnosis not present

## 2015-11-15 ENCOUNTER — Encounter (INDEPENDENT_AMBULATORY_CARE_PROVIDER_SITE_OTHER): Payer: Medicare Other | Admitting: Ophthalmology

## 2015-11-15 DIAGNOSIS — H43813 Vitreous degeneration, bilateral: Secondary | ICD-10-CM | POA: Diagnosis not present

## 2015-11-15 DIAGNOSIS — H35033 Hypertensive retinopathy, bilateral: Secondary | ICD-10-CM

## 2015-11-15 DIAGNOSIS — D3131 Benign neoplasm of right choroid: Secondary | ICD-10-CM | POA: Diagnosis not present

## 2015-11-15 DIAGNOSIS — H34831 Tributary (branch) retinal vein occlusion, right eye, with macular edema: Secondary | ICD-10-CM

## 2015-11-15 DIAGNOSIS — I10 Essential (primary) hypertension: Secondary | ICD-10-CM

## 2015-12-13 ENCOUNTER — Encounter (INDEPENDENT_AMBULATORY_CARE_PROVIDER_SITE_OTHER): Payer: Medicare Other | Admitting: Ophthalmology

## 2015-12-13 DIAGNOSIS — H43813 Vitreous degeneration, bilateral: Secondary | ICD-10-CM

## 2015-12-13 DIAGNOSIS — H35033 Hypertensive retinopathy, bilateral: Secondary | ICD-10-CM

## 2015-12-13 DIAGNOSIS — D3131 Benign neoplasm of right choroid: Secondary | ICD-10-CM | POA: Diagnosis not present

## 2015-12-13 DIAGNOSIS — H34831 Tributary (branch) retinal vein occlusion, right eye, with macular edema: Secondary | ICD-10-CM

## 2015-12-13 DIAGNOSIS — I10 Essential (primary) hypertension: Secondary | ICD-10-CM | POA: Diagnosis not present

## 2015-12-27 DIAGNOSIS — E119 Type 2 diabetes mellitus without complications: Secondary | ICD-10-CM | POA: Diagnosis not present

## 2016-01-10 ENCOUNTER — Encounter (INDEPENDENT_AMBULATORY_CARE_PROVIDER_SITE_OTHER): Payer: Medicare Other | Admitting: Ophthalmology

## 2016-01-10 DIAGNOSIS — D3131 Benign neoplasm of right choroid: Secondary | ICD-10-CM | POA: Diagnosis not present

## 2016-01-10 DIAGNOSIS — H35033 Hypertensive retinopathy, bilateral: Secondary | ICD-10-CM

## 2016-01-10 DIAGNOSIS — H43813 Vitreous degeneration, bilateral: Secondary | ICD-10-CM | POA: Diagnosis not present

## 2016-01-10 DIAGNOSIS — H34831 Tributary (branch) retinal vein occlusion, right eye, with macular edema: Secondary | ICD-10-CM | POA: Diagnosis not present

## 2016-01-10 DIAGNOSIS — I10 Essential (primary) hypertension: Secondary | ICD-10-CM | POA: Diagnosis not present

## 2016-01-16 DIAGNOSIS — R42 Dizziness and giddiness: Secondary | ICD-10-CM | POA: Diagnosis not present

## 2016-01-16 DIAGNOSIS — Z7901 Long term (current) use of anticoagulants: Secondary | ICD-10-CM | POA: Diagnosis not present

## 2016-01-16 DIAGNOSIS — I10 Essential (primary) hypertension: Secondary | ICD-10-CM | POA: Diagnosis not present

## 2016-01-16 DIAGNOSIS — I2510 Atherosclerotic heart disease of native coronary artery without angina pectoris: Secondary | ICD-10-CM | POA: Diagnosis not present

## 2016-01-16 DIAGNOSIS — E119 Type 2 diabetes mellitus without complications: Secondary | ICD-10-CM | POA: Diagnosis not present

## 2016-01-16 DIAGNOSIS — E785 Hyperlipidemia, unspecified: Secondary | ICD-10-CM | POA: Diagnosis not present

## 2016-01-16 DIAGNOSIS — I480 Paroxysmal atrial fibrillation: Secondary | ICD-10-CM | POA: Diagnosis not present

## 2016-01-16 DIAGNOSIS — Z951 Presence of aortocoronary bypass graft: Secondary | ICD-10-CM | POA: Diagnosis not present

## 2016-01-30 DIAGNOSIS — I10 Essential (primary) hypertension: Secondary | ICD-10-CM | POA: Diagnosis not present

## 2016-01-30 DIAGNOSIS — R42 Dizziness and giddiness: Secondary | ICD-10-CM | POA: Diagnosis not present

## 2016-01-30 DIAGNOSIS — I480 Paroxysmal atrial fibrillation: Secondary | ICD-10-CM | POA: Diagnosis not present

## 2016-01-30 DIAGNOSIS — I2510 Atherosclerotic heart disease of native coronary artery without angina pectoris: Secondary | ICD-10-CM | POA: Diagnosis not present

## 2016-01-30 DIAGNOSIS — Z7901 Long term (current) use of anticoagulants: Secondary | ICD-10-CM | POA: Diagnosis not present

## 2016-01-30 DIAGNOSIS — Z951 Presence of aortocoronary bypass graft: Secondary | ICD-10-CM | POA: Diagnosis not present

## 2016-01-30 DIAGNOSIS — E785 Hyperlipidemia, unspecified: Secondary | ICD-10-CM | POA: Diagnosis not present

## 2016-01-30 DIAGNOSIS — E119 Type 2 diabetes mellitus without complications: Secondary | ICD-10-CM | POA: Diagnosis not present

## 2016-02-07 ENCOUNTER — Encounter (INDEPENDENT_AMBULATORY_CARE_PROVIDER_SITE_OTHER): Payer: Medicare Other | Admitting: Ophthalmology

## 2016-02-07 DIAGNOSIS — H34831 Tributary (branch) retinal vein occlusion, right eye, with macular edema: Secondary | ICD-10-CM

## 2016-02-07 DIAGNOSIS — D3131 Benign neoplasm of right choroid: Secondary | ICD-10-CM | POA: Diagnosis not present

## 2016-02-07 DIAGNOSIS — I10 Essential (primary) hypertension: Secondary | ICD-10-CM | POA: Diagnosis not present

## 2016-02-07 DIAGNOSIS — H43813 Vitreous degeneration, bilateral: Secondary | ICD-10-CM | POA: Diagnosis not present

## 2016-02-07 DIAGNOSIS — H35033 Hypertensive retinopathy, bilateral: Secondary | ICD-10-CM | POA: Diagnosis not present

## 2016-02-12 DIAGNOSIS — H26491 Other secondary cataract, right eye: Secondary | ICD-10-CM | POA: Diagnosis not present

## 2016-02-13 ENCOUNTER — Ambulatory Visit (INDEPENDENT_AMBULATORY_CARE_PROVIDER_SITE_OTHER): Payer: Medicare Other | Admitting: Neurology

## 2016-02-13 ENCOUNTER — Encounter: Payer: Self-pay | Admitting: Neurology

## 2016-02-13 VITALS — BP 190/99 | HR 76 | Resp 16 | Ht 70.0 in | Wt 175.0 lb

## 2016-02-13 DIAGNOSIS — R531 Weakness: Secondary | ICD-10-CM

## 2016-02-13 DIAGNOSIS — R413 Other amnesia: Secondary | ICD-10-CM | POA: Diagnosis not present

## 2016-02-13 DIAGNOSIS — R42 Dizziness and giddiness: Secondary | ICD-10-CM | POA: Diagnosis not present

## 2016-02-13 DIAGNOSIS — M6289 Other specified disorders of muscle: Secondary | ICD-10-CM | POA: Diagnosis not present

## 2016-02-13 DIAGNOSIS — R2689 Other abnormalities of gait and mobility: Secondary | ICD-10-CM

## 2016-02-13 DIAGNOSIS — R29818 Other symptoms and signs involving the nervous system: Secondary | ICD-10-CM

## 2016-02-13 NOTE — Patient Instructions (Addendum)
We will do a brain scan, called MRI and call you with the test results. We will have to schedule you for this on a separate date. This test requires authorization from your insurance, and we will take care of the insurance process.  We will refer you to physical therapy for gait and balance and strengthening exercises.   Your memory scores are good.   Monitor your water intake.   Please ask family and friends or your significant other or bed partner if you snore and if so, how loud it is, and if you have breathing related issues in your sleep, such as: snorting sounds, choking sounds, pauses in your breathing or shallow breathing events. These may be symptoms of obstructive sleep apnea (OSA).

## 2016-02-13 NOTE — Progress Notes (Signed)
Subjective:    Patient ID: Jonathan [name] is a 82 y.o. male.  HPI      Star Age, MD, PhD [facility] [address], [address] [place], [place] [address]   Dear Dr. Laurann Montana,   I saw your patient, Jonathan [name], upon your kind request in my neurologic clinic today for initial consultation of his memory loss, dizziness and foot drop. The patient is accompanied by his son today. As you know, Jonathan [name] is an 82 year old right-handed gentleman with an underlying complex medical history of BPH, right leg weakness, lumbar degenerative disc disease, history of L3-for herniated disc, coronary artery disease status post four-vessel CABG in 2007, hypertension, paroxysmal A. fib, on Xarelto, type 2 diabetes with diabetic retinopathy, hyperlipidemia, who reports feeling off balance for some months, and he feels weaker in his L leg. He has had some memory complaints. Overall, he reports that since his hospitalization in December 2016 he does not feel right and not the same. He used to be very active and very strong and now he feels easily exhausted, weaker, off balance, unsteady on his feet at times, mentally less sharp. He brought in a lot of written notes and records, hand written notes, had multiple questions today and blood pressure and pulse log. All of the questions were reviewed and his notes were reviewed and I reviewed his handwritten blood pressure and pulse log as well. He has been working with his cardiologist with medication changes. Coreg is being phased out. Some of his symptoms were attributed to medication side effects. He has complaints of dizziness. Sometimes he feels that the room is spinning. I reviewed your office note from 09/17/2015, which you kindly included. Blood work from 09/17/2015 was also reviewed, hemoglobin A1c 7.7, BMP unremarkable with the exception of glucose at 1:30, lipid panel showed total cholesterol of 204, triglycerides 167, LDL  139. Of note, he was hospitalized from 09/28/2015 through 10/04/2015 secondary to fever, shortness of breath and palpitation. He was felt to have sepsis secondary to febrile illness from a viral infection. I reviewed records from the hospitalization including test results and discharge summary. He was treated with IV antibiotics, initially vancomycin and Zosyn and then changed to ceftriaxone and azithromycin.  He  had a TTE on 09/28/2015 which showed systolic function was vigorous, estimated EF was 65-70%. He had He had an abnormal stress test on 09/29/15. He had C Doppler study on 10/29/15 with <40% stenosis on R ICA and 40-59% stenosis on L ICA. He had a left heart cath on 10/01/2015 which showed severe three-vessel obstructive coronary artery disease, patent lima to LAD, patent SVG to PDA, occluded sequential SVG to the second and third OM's. Mild LV Dysfunction. Cardiology was consulted. Medical therapy was recommended. He had a chest x-ray and CT chest.  He had a head CT without contrast on 08/08/2015 which I reviewed:IMPRESSION: 1. No acute intracranial abnormalities. 2. 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  He denies any significant snoring. He does not always wake up rested. 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 He quit smoking in 1958 and quit alcohol altogether in 2015 and was an occasional beer drinker before then. He has never used illicit drugs and drinks about 3 cups of coffee per day, not always enough water.  It bothers him that he does not have full recollection from all the events that happened during his hospitalization in December  2016. He was sedated for his left heart cath and has amnesia for quite some time even after the left heart cath.  His Past Medical History Is Significant For: Past Medical History  Diagnosis Date  . Hypertension   . Hyperlipidemia   . Degeneration of lumbar or lumbosacral  intervertebral disc   . BPH (benign prostatic hypertrophy)   . Allergic rhinitis   . Dyslipidemia   . Right leg weakness   . Coronary atherosclerosis of native coronary artery   . S/P CABG x 4 2006    LIMA-LAD, SVG-rPDA, sSVG-OM1-OM2  . HOH (hard of hearing)   . Wears glasses     reading  . Diabetes ([place])     no meds  . Stroke [facility])     His Past Surgical History Is Significant For: Past Surgical History  Procedure Laterality Date  . Rotator cuff repair    . Laminectomy    . Bil carpal tunnel surgery    . Hemorrhoid surgery    . Transurethral resection of prostate    . Tonsillectomy    . Colonoscopy    . Eye surgery      both cataracts  . Dupuytren contracture release Left 04/05/2014    Procedure: EXCISION DUPUYTREN LEFT PALM, RING, SMALL, AND THUMB;  Surgeon: Cammie Sickle, MD;  Location: [facility];  Service: Orthopedics;  Laterality: Left;  . Coronary artery bypass graft  2006     Coronary artery bypass grafting x 4 (left internal mammary  . Cardiac catheterization N/A 10/01/2015    Procedure: Left Heart Cath and Cors/Grafts Angiography;  Surgeon: Peter M Martinique, MD;  Location: [place] [facility];  Service: Cardiovascular;  Laterality: N/A;    His Family History Is Significant For: Family History  Problem Relation Age of Onset  . Hypertension Father     His Social History Is Significant For: Social History   Social History  . Marital Status: Married    Spouse Name: N/A  . Number of Children: 2  . Years of Education: HS   Occupational History  . Retired    Social History Main Topics  . Smoking status: Former Research scientist (life sciences)  . Smokeless tobacco: None     Comment: Quit 1958  . Alcohol Use: 0.0 oz/week    0 Standard drinks or equivalent per week  . Drug Use: No  . Sexual Activity: Not Asked   Other Topics Concern  . None   Social History Narrative   Retired ,Scientist, clinical (histocompatibility and immunogenetics), married .    His Allergies Are:  Allergies  Allergen  Reactions  . Amlodipine Other (See Comments)    Fatigue   . Codeine     unknown  . Metformin And Related Other (See Comments)    fatigue  . Statins     "pass out, dizzy, drunk feeling, poor balance"  . Welchol [Colesevelam Hcl]   :   His Current Medications Are:  Outpatient Encounter Prescriptions as of 02/13/2016  Medication Sig  . carvedilol (COREG) 25 MG tablet   . isosorbide mononitrate (IMDUR) 30 MG 24 hr tablet Take 1 tablet (30 mg total) by mouth daily.  . rivaroxaban (XARELTO) 20 MG TABS tablet Take 1 tablet (20 mg total) by mouth daily with supper.  . [DISCONTINUED] carvedilol (COREG) 25 MG tablet Take 1 tablet (25 mg total) by mouth 2 (two) times daily with a meal.  . [DISCONTINUED] carvedilol (COREG) 3.125 MG tablet    No facility-administered encounter medications  on file as of 02/13/2016.  :   Review of Systems:  Out of a complete 14 point review of systems, all are reviewed and negative with the exception of these symptoms as listed below:   Review of Systems  Neurological:       Patient reports that he has trouble with dizziness and unstable balance. Since then he has had some medication changes. The dizziness has changed and become constant Reports that his dizziness is less in the morning, before taking his medications. Cardiologist is lowering dose of Coreg.     Objective:  Neurologic Exam  Physical Exam Physical Examination:   Filed Vitals:   02/13/16 1451 02/13/16 1456  BP: 190/97 190/99  Pulse: 73 76  Resp: 16 16   General Examination: The patient is a very pleasant 82 y.o. male in no acute distress. He appears well-developed and well-nourished and well groomed. He denies lightheadedness upon standing. He has no vertiginous symptoms.  HEENT: Normocephalic, atraumatic, pupils are equal, round and reactive to light and accommodation. Funduscopic exam is normal with sharp disc margins noted. Extraocular tracking is good without limitation to gaze  excursion or nystagmus noted. Normal smooth pursuit is noted. Hearing is grossly intact. Face is symmetric with normal facial animation and normal facial sensation. Speech is clear with no dysarthria noted. There is no hypophonia. There is no lip, neck/head, jaw or voice tremor. Neck is supple with full range of passive and active motion. There are no carotid bruits on auscultation. Oropharynx exam reveals: mild mouth dryness, adequate dental hygiene and mild airway crowding, due to Smaller airway entry, redundant soft palate, and larger tongue. Mallampati is class II. Tongue protrudes centrally and palate elevates symmetrically.   Chest: Clear to auscultation without wheezing, rhonchi or crackles noted.  Heart: S1+S2+0, regular and normal without murmurs, rubs or gallops noted.   Abdomen: Soft, non-tender and non-distended with normal bowel sounds appreciated on auscultation.  Extremities: There is no pitting edema in the distal lower extremities bilaterally. Pedal pulses are intact.  Skin: Warm and dry without trophic changes noted. There are no varicose veins.  Musculoskeletal: exam reveals no obvious joint deformities, tenderness or joint swelling or erythema.   Neurologically:  Mental status: The patient is awake, alert and oriented in all 4 spheres. His immediate and remote memory, attention, language skills and fund of knowledge are appropriate. There is no evidence of aphasia, agnosia, apraxia or anomia. Speech is clear with normal prosody and enunciation. Thought process is linear. Mood is normal and affect is normal.   On 02/13/2016: MMSE is 30 out of 30, clock drawing is 4 out of 4, animal fluency is 9/m. Cranial nerves II - XII are as described above under HEENT exam. In addition: shoulder shrug is normal with equal shoulder height noted. Motor exam: Normal bulk, strength and tone is noted. He has thinner legs and thigh muscles appear to be slender. But overall strength is very good and  he does not have a foot drop. There is no drift, tremor or rebound. Romberg is negative. Reflexes are 1+ in the upper extremities, trace in the knees and absent in the ankles.  2+ throughout. Babinski: Toes are flexor bilaterally. Fine motor skills and coordination: intact with normal finger taps, normal hand movements, normal rapid alternating patting, normal foot taps and normal foot agility.  Cerebellar testing: No dysmetria or intention tremor on finger to nose testing. Heel to shin is unremarkable bilaterally. There is no truncal or gait ataxia.  Sensory exam: intact to light touch, pinprick, vibration, temperature sense in the upper and lower extremities.  Gait, station and balance: He stands without difficulty.  stands slightly wide-based. He walks fairly well with preserved arm swing but reports that he walks slower than his usual and with less confidence. He is slightly insecure returning and does stagger a little bit when he turns and he has a tendency to turn quite quickly. Tandem walk is not possible safely for him.  Assessment and Plan:   In summary, Jonathan [name] is a very pleasant 82 y.o.-year old male with an underlying complex medical history of BPH, right leg weakness, lumbar degenerative disc disease, history of L3-for herniated disc, coronary artery disease status post four-vessel CABG in 2007, hypertension, paroxysmal A. fib, on Xarelto, type 2 diabetes with diabetic retinopathy, hyperlipidemia, who Presents for initial consultation for a variety of concerns including balance issues, weakness particularly left-sided weakness, exhaustion, dizziness, lethargy, lack of energy, and memory issues. He was recently hospitalized for a viral infection, sepsis, and was found to have restenosis of some of his bypass blood vessels. He was not a candidate for stenting. Overall, he reports that since his hospitalization in December 2016 he does not feel right and not the same. He used to be very  active and very strong and now he feels easily exhausted, weaker, off balance, unsteady on his feet at times, mentally less sharp. On examination, he does not have any orthostatic hypotension, memory scores are not bad, certainly not in keeping with dementia, overall physical and neurological exam also nonfocal, neurological exam, not in keeping with one-sided weakness and I did not detect any foot drop. He is somewhat unsteady and less confident with his walk. Nothing specific with his walk stands out though. He certainly does not have symptoms and signs in keeping with vertigo. I talked to the patient and his son at length today. I explained to him that his symptomatology may not be explained by 1 factor one issue and may not be fixable with one intervention. I think it is a good idea to revisit his medication regimen and he is in the process of phasing out of Coreg. In addition, from my end of things, I suggested we proceed with a brain MRI. His head CT from October 2016 showed old lacunar strokes. We talked about stroke secondary prevention, we talked about overall healthy lifestyle. We talked about age related issues with balance and memory and energy. I tried to explain to him that having gone through a fairly eventful hospital admission recently can also cause setbacks and it may just take him longer to recuperate from a systemic illness. He is also worried about his cardiac health. I think stress is a player as well.  I suggested we proceed with a brain MRI, physical therapy evaluation for gait and balance evaluation and possible strength training. He is somewhat reluctant to pursue physical therapy as in the past even after surgery physical therapy was not very helpful he feels. We will also do an EEG in our office. We will keep them posted via phone call as to his test results. I did not suggest any new medication. I suggested that he increase his water intake with a goal of about 6 glasses of water per  day. He is advised to pace himself as well but to stay active mentally and physically.  I will see him back routinely in a couple months, sooner if needed. I answered all their questions  today and he had several comments and questions. He was in agreement with the plan. This was an extended and complex visit.  Thank you very much for allowing me to participate in the care of this nice patient. If I can be of any further assistance to you please do not hesitate to call me at [phone].  Sincerely,   Star Age, MD, PhD

## 2016-02-18 DIAGNOSIS — I10 Essential (primary) hypertension: Secondary | ICD-10-CM | POA: Diagnosis not present

## 2016-02-18 DIAGNOSIS — R42 Dizziness and giddiness: Secondary | ICD-10-CM | POA: Diagnosis not present

## 2016-02-18 DIAGNOSIS — Z7901 Long term (current) use of anticoagulants: Secondary | ICD-10-CM | POA: Diagnosis not present

## 2016-02-18 DIAGNOSIS — I2510 Atherosclerotic heart disease of native coronary artery without angina pectoris: Secondary | ICD-10-CM | POA: Diagnosis not present

## 2016-02-18 DIAGNOSIS — Z951 Presence of aortocoronary bypass graft: Secondary | ICD-10-CM | POA: Diagnosis not present

## 2016-02-18 DIAGNOSIS — E785 Hyperlipidemia, unspecified: Secondary | ICD-10-CM | POA: Diagnosis not present

## 2016-02-18 DIAGNOSIS — I480 Paroxysmal atrial fibrillation: Secondary | ICD-10-CM | POA: Diagnosis not present

## 2016-02-18 DIAGNOSIS — E119 Type 2 diabetes mellitus without complications: Secondary | ICD-10-CM | POA: Diagnosis not present

## 2016-02-21 ENCOUNTER — Ambulatory Visit
Admission: RE | Admit: 2016-02-21 | Discharge: 2016-02-21 | Disposition: A | Discharge status: Home / Self Care | Payer: Medicare Other | Source: Ambulatory Visit | Attending: Neurology | Admitting: Neurology

## 2016-02-21 DIAGNOSIS — R531 Weakness: Secondary | ICD-10-CM

## 2016-02-21 DIAGNOSIS — R42 Dizziness and giddiness: Secondary | ICD-10-CM

## 2016-02-21 DIAGNOSIS — R413 Other amnesia: Secondary | ICD-10-CM

## 2016-02-21 DIAGNOSIS — M6289 Other specified disorders of muscle: Secondary | ICD-10-CM | POA: Diagnosis not present

## 2016-02-21 DIAGNOSIS — R2689 Other abnormalities of gait and mobility: Secondary | ICD-10-CM

## 2016-02-24 NOTE — Progress Notes (Signed)
Quick Note:  Please call patient regarding the recent brain MRI: The brain scan showed a normal structure of the brain and mild volume loss which we call atrophy. There were changes in the deeper structures of the brain, which we call white matter changes or microvascular changes. These were reported as moderate in His case, but truthfully, mostly in keeping with [name] related and expected changes.  These are tiny white spots, that occur with time and are seen in a variety of conditions, including with normal aging, chronic hypertension, chronic headaches, especially migraine HAs, chronic diabetes, chronic hyperlipidemia. These are not strokes and no mass or lesion were seen which is reassuring. Again, there were no acute findings, such as a stroke, or mass or blood products. No further action is required on this test at this time, other than re-enforcing the importance of good blood pressure control, good cholesterol control, good blood sugar control, and weight management. Please remind patient to keep any upcoming appointments or tests and to call us with any interim questions, concerns, problems or updates. Thanks,  Jonathan Age, MD, PhD    ______

## 2016-02-25 ENCOUNTER — Telehealth: Payer: Self-pay

## 2016-02-25 NOTE — Telephone Encounter (Signed)
LM for patient to call back for results

## 2016-02-25 NOTE — Telephone Encounter (Signed)
-----   Message from Star Age, MD sent at 02/24/2016 10:08 AM EDT ----- Please call patient regarding the recent brain MRI: The brain scan showed a normal structure of the brain and mild volume loss which we call atrophy. There were changes in the deeper structures of the brain, which we call white matter changes or microvascular changes. These were reported as moderate in His case, but truthfully, mostly in keeping with age related and expected changes.  These are tiny white spots, that occur with time and are seen in a variety of conditions, including with normal aging, chronic hypertension, chronic headaches, especially migraine HAs, chronic diabetes, chronic hyperlipidemia. These are not strokes and no mass or lesion were seen which is reassuring. Again, there were no acute findings, such as a stroke, or mass or blood products. No further action is required on this test at this time, other than re-enforcing the importance of good blood pressure control, good cholesterol control, good blood sugar control, and weight management. Please remind patient to keep any upcoming appointments or tests and to call us with any interim questions, concerns, problems or updates. Thanks,  Star Age, MD, PhD

## 2016-03-02 ENCOUNTER — Ambulatory Visit (INDEPENDENT_AMBULATORY_CARE_PROVIDER_SITE_OTHER): Payer: Medicare Other

## 2016-03-02 NOTE — Telephone Encounter (Signed)
I called patient back and he is aware of results and recommendations.

## 2016-03-03 ENCOUNTER — Ambulatory Visit (INDEPENDENT_AMBULATORY_CARE_PROVIDER_SITE_OTHER): Payer: Medicare Other | Admitting: Neurology

## 2016-03-03 DIAGNOSIS — R410 Disorientation, unspecified: Secondary | ICD-10-CM | POA: Diagnosis not present

## 2016-03-03 DIAGNOSIS — R413 Other amnesia: Secondary | ICD-10-CM

## 2016-03-03 NOTE — Procedures (Signed)
    History: Jonathan [name] is an 82 year old gentleman with a history of dizziness and a gait disturbance. He has some issues with memory and slight confusion. The patient is being evaluated for this issue.  This is a routine EEG. No skull defects are noted. Medications include Coreg, Imdur, and Xarelto.   EEG classification: Normal awake and drowsy  Description of the recording: The background rhythms of this recording consists of a fairly well modulated medium amplitude alpha rhythm of 9 Hz that is reactive to eye opening and closure. As the record progresses, the patient appears to remain in the waking state throughout the recording. Photic stimulation and hyperventilation were not performed. Toward the end of the recording, the patient enters the drowsy state with slight symmetric slowing seen. The patient never enters stage II sleep. At no time during the recording does there appear to be evidence of spike or spike wave discharges or evidence of focal slowing. EKG monitor shows no evidence of cardiac rhythm abnormalities with a heart rate of 72.  Impression: This is a normal EEG recording in the waking and drowsy state. No evidence of ictal or interictal discharges are seen.

## 2016-03-16 NOTE — Progress Notes (Signed)
Quick Note:  Please call and advise the patient that the EEG or brain wave test we performed was reported as normal in the awake and drowsy states. We checked for abnormal electrical discharges in the brain waves and the report suggested normal findings. No further action is required on this test at this time. Please remind patient to keep any upcoming appointments or tests and to call us with any interim questions, concerns, problems or updates. Thanks,  Jonathan Mckeough, MD, PhD    ______ 

## 2016-03-18 ENCOUNTER — Telehealth: Payer: Self-pay

## 2016-03-18 NOTE — Telephone Encounter (Signed)
-----   Message from Star Age, MD sent at 03/16/2016 11:44 AM EDT ----- Please call and advise the patient that the EEG or brain wave test we performed was reported as normal in the awake and drowsy states. We checked for abnormal electrical discharges in the brain waves and the report suggested normal findings. No further action is required on this test at this time. Please remind patient to keep any upcoming appointments or tests and to call us with any interim questions, concerns, problems or updates. Thanks,  Star Age, MD, PhD

## 2016-03-18 NOTE — Telephone Encounter (Signed)
I spoke to patient and he is aware of results/.  

## 2016-03-19 DIAGNOSIS — R5383 Other fatigue: Secondary | ICD-10-CM | POA: Diagnosis not present

## 2016-03-19 DIAGNOSIS — I10 Essential (primary) hypertension: Secondary | ICD-10-CM | POA: Diagnosis not present

## 2016-03-19 DIAGNOSIS — R42 Dizziness and giddiness: Secondary | ICD-10-CM | POA: Diagnosis not present

## 2016-03-19 DIAGNOSIS — E119 Type 2 diabetes mellitus without complications: Secondary | ICD-10-CM | POA: Diagnosis not present

## 2016-03-19 DIAGNOSIS — I480 Paroxysmal atrial fibrillation: Secondary | ICD-10-CM | POA: Diagnosis not present

## 2016-04-17 DIAGNOSIS — E119 Type 2 diabetes mellitus without complications: Secondary | ICD-10-CM | POA: Diagnosis not present

## 2016-05-19 DIAGNOSIS — Z7901 Long term (current) use of anticoagulants: Secondary | ICD-10-CM | POA: Diagnosis not present

## 2016-05-19 DIAGNOSIS — I2510 Atherosclerotic heart disease of native coronary artery without angina pectoris: Secondary | ICD-10-CM | POA: Diagnosis not present

## 2016-05-19 DIAGNOSIS — Z951 Presence of aortocoronary bypass graft: Secondary | ICD-10-CM | POA: Diagnosis not present

## 2016-05-19 DIAGNOSIS — I10 Essential (primary) hypertension: Secondary | ICD-10-CM | POA: Diagnosis not present

## 2016-05-19 DIAGNOSIS — I480 Paroxysmal atrial fibrillation: Secondary | ICD-10-CM | POA: Diagnosis not present

## 2016-05-19 DIAGNOSIS — R42 Dizziness and giddiness: Secondary | ICD-10-CM | POA: Diagnosis not present

## 2016-05-19 DIAGNOSIS — E785 Hyperlipidemia, unspecified: Secondary | ICD-10-CM | POA: Diagnosis not present

## 2016-05-19 DIAGNOSIS — E119 Type 2 diabetes mellitus without complications: Secondary | ICD-10-CM | POA: Diagnosis not present

## 2016-05-20 ENCOUNTER — Ambulatory Visit: Payer: Medicare Other | Admitting: Neurology

## 2016-06-16 DIAGNOSIS — H04123 Dry eye syndrome of bilateral lacrimal glands: Secondary | ICD-10-CM | POA: Diagnosis not present

## 2016-06-16 DIAGNOSIS — Z961 Presence of intraocular lens: Secondary | ICD-10-CM | POA: Diagnosis not present

## 2016-06-16 DIAGNOSIS — H02831 Dermatochalasis of right upper eyelid: Secondary | ICD-10-CM | POA: Diagnosis not present

## 2016-06-16 DIAGNOSIS — H02834 Dermatochalasis of left upper eyelid: Secondary | ICD-10-CM | POA: Diagnosis not present

## 2016-08-10 DIAGNOSIS — E119 Type 2 diabetes mellitus without complications: Secondary | ICD-10-CM | POA: Diagnosis not present

## 2016-08-12 ENCOUNTER — Ambulatory Visit (INDEPENDENT_AMBULATORY_CARE_PROVIDER_SITE_OTHER): Payer: Medicare Other | Admitting: Ophthalmology

## 2016-09-22 DIAGNOSIS — I480 Paroxysmal atrial fibrillation: Secondary | ICD-10-CM | POA: Diagnosis not present

## 2016-09-22 DIAGNOSIS — Z1389 Encounter for screening for other disorder: Secondary | ICD-10-CM | POA: Diagnosis not present

## 2016-09-22 DIAGNOSIS — I10 Essential (primary) hypertension: Secondary | ICD-10-CM | POA: Diagnosis not present

## 2016-09-22 DIAGNOSIS — I2510 Atherosclerotic heart disease of native coronary artery without angina pectoris: Secondary | ICD-10-CM | POA: Diagnosis not present

## 2016-09-22 DIAGNOSIS — Z0000 Encounter for general adult medical examination without abnormal findings: Secondary | ICD-10-CM | POA: Diagnosis not present

## 2016-09-22 DIAGNOSIS — E119 Type 2 diabetes mellitus without complications: Secondary | ICD-10-CM | POA: Diagnosis not present

## 2016-10-28 ENCOUNTER — Emergency Department (HOSPITAL_COMMUNITY): Payer: Medicare Other

## 2016-10-28 ENCOUNTER — Inpatient Hospital Stay (HOSPITAL_COMMUNITY)
Admission: EM | Admit: 2016-10-28 | Discharge: 2016-10-30 | DRG: 066 | Disposition: A | Discharge status: Rehabilitation Facility | Payer: Medicare Other | Attending: Internal Medicine | Admitting: Internal Medicine

## 2016-10-28 ENCOUNTER — Encounter (HOSPITAL_COMMUNITY): Payer: Self-pay | Admitting: Emergency Medicine

## 2016-10-28 DIAGNOSIS — E1165 Type 2 diabetes mellitus with hyperglycemia: Secondary | ICD-10-CM | POA: Diagnosis not present

## 2016-10-28 DIAGNOSIS — I6330 Cerebral infarction due to thrombosis of unspecified cerebral artery: Secondary | ICD-10-CM | POA: Diagnosis not present

## 2016-10-28 DIAGNOSIS — Z8249 Family history of ischemic heart disease and other diseases of the circulatory system: Secondary | ICD-10-CM

## 2016-10-28 DIAGNOSIS — R410 Disorientation, unspecified: Secondary | ICD-10-CM | POA: Diagnosis not present

## 2016-10-28 DIAGNOSIS — Z951 Presence of aortocoronary bypass graft: Secondary | ICD-10-CM

## 2016-10-28 DIAGNOSIS — R4701 Aphasia: Secondary | ICD-10-CM | POA: Diagnosis present

## 2016-10-28 DIAGNOSIS — I480 Paroxysmal atrial fibrillation: Secondary | ICD-10-CM | POA: Diagnosis not present

## 2016-10-28 DIAGNOSIS — E1151 Type 2 diabetes mellitus with diabetic peripheral angiopathy without gangrene: Secondary | ICD-10-CM | POA: Diagnosis present

## 2016-10-28 DIAGNOSIS — E785 Hyperlipidemia, unspecified: Secondary | ICD-10-CM | POA: Diagnosis not present

## 2016-10-28 DIAGNOSIS — E119 Type 2 diabetes mellitus without complications: Secondary | ICD-10-CM

## 2016-10-28 DIAGNOSIS — I639 Cerebral infarction, unspecified: Secondary | ICD-10-CM | POA: Diagnosis not present

## 2016-10-28 DIAGNOSIS — R4182 Altered mental status, unspecified: Secondary | ICD-10-CM

## 2016-10-28 DIAGNOSIS — I119 Hypertensive heart disease without heart failure: Secondary | ICD-10-CM | POA: Diagnosis not present

## 2016-10-28 DIAGNOSIS — R4189 Other symptoms and signs involving cognitive functions and awareness: Secondary | ICD-10-CM | POA: Diagnosis not present

## 2016-10-28 DIAGNOSIS — I10 Essential (primary) hypertension: Secondary | ICD-10-CM | POA: Diagnosis present

## 2016-10-28 DIAGNOSIS — R6889 Other general symptoms and signs: Secondary | ICD-10-CM | POA: Diagnosis not present

## 2016-10-28 DIAGNOSIS — Z79899 Other long term (current) drug therapy: Secondary | ICD-10-CM

## 2016-10-28 DIAGNOSIS — Z8673 Personal history of transient ischemic attack (TIA), and cerebral infarction without residual deficits: Secondary | ICD-10-CM

## 2016-10-28 DIAGNOSIS — H9190 Unspecified hearing loss, unspecified ear: Secondary | ICD-10-CM | POA: Diagnosis not present

## 2016-10-28 DIAGNOSIS — I2510 Atherosclerotic heart disease of native coronary artery without angina pectoris: Secondary | ICD-10-CM | POA: Diagnosis present

## 2016-10-28 DIAGNOSIS — R55 Syncope and collapse: Secondary | ICD-10-CM | POA: Diagnosis present

## 2016-10-28 DIAGNOSIS — Z87891 Personal history of nicotine dependence: Secondary | ICD-10-CM

## 2016-10-28 LAB — BASIC METABOLIC PANEL
Anion gap: 8 (ref 5–15)
BUN: 15 mg/dL (ref 6–20)
CHLORIDE: 104 mmol/L (ref 101–111)
CO2: 27 mmol/L (ref 22–32)
CREATININE: 0.71 mg/dL (ref 0.61–1.24)
Calcium: 9.2 mg/dL (ref 8.9–10.3)
GFR calc Af Amer: >60 mL/min (ref >60)
GLUCOSE: 133 mg/dL — AB (ref 65–99)
Potassium: 3.9 mmol/L (ref 3.5–5.1)
Sodium: 139 mmol/L (ref 135–145)

## 2016-10-28 LAB — URINALYSIS, ROUTINE W REFLEX MICROSCOPIC
BILIRUBIN URINE: NEGATIVE
GLUCOSE, UA: NEGATIVE mg/dL
HGB URINE DIPSTICK: NEGATIVE
Ketones, ur: NEGATIVE mg/dL
Leukocytes, UA: NEGATIVE
Nitrite: NEGATIVE
PROTEIN: NEGATIVE mg/dL
Specific Gravity, Urine: 1.014 (ref 1.005–1.030)
pH: 7 (ref 5.0–8.0)

## 2016-10-28 LAB — I-STAT TROPONIN, ED: Troponin i, poc: 0 ng/mL (ref 0.00–0.08)

## 2016-10-28 LAB — COOXEMETRY PANEL
CARBOXYHEMOGLOBIN: 1.1 % (ref 0.5–1.5)
Methemoglobin: 1 % (ref 0.0–1.5)
O2 SAT: 95.3 %
Total hemoglobin: 12.3 g/dL (ref 12.0–16.0)

## 2016-10-28 LAB — CBC WITH DIFFERENTIAL/PLATELET
Basophils Absolute: 0 10*3/uL (ref 0.0–0.1)
Basophils Relative: 1 %
EOS ABS: 0.2 10*3/uL (ref 0.0–0.7)
EOS PCT: 2 %
HCT: 38.5 % — ABNORMAL LOW (ref 39.0–52.0)
Hemoglobin: 12.9 g/dL — ABNORMAL LOW (ref 13.0–17.0)
LYMPHS ABS: 1.3 10*3/uL (ref 0.7–4.0)
LYMPHS PCT: 15 %
MCH: 26.5 pg (ref 26.0–34.0)
MCHC: 33.5 g/dL (ref 30.0–36.0)
MCV: 79.2 fL (ref 78.0–100.0)
MONO ABS: 0.5 10*3/uL (ref 0.1–1.0)
MONOS PCT: 6 %
Neutro Abs: 6.6 10*3/uL (ref 1.7–7.7)
Neutrophils Relative %: 76 %
PLATELETS: 241 10*3/uL (ref 150–400)
RBC: 4.86 MIL/uL (ref 4.22–5.81)
RDW: 14 % (ref 11.5–15.5)
WBC: 8.6 10*3/uL (ref 4.0–10.5)

## 2016-10-28 MED ORDER — ENOXAPARIN SODIUM 40 MG/0.4ML ~~LOC~~ SOLN
40.0000 mg | SUBCUTANEOUS | Status: DC
  Administered 2016-10-28: 40 mg via SUBCUTANEOUS
  Filled 2016-10-28: qty 0.4

## 2016-10-28 MED ORDER — ISOSORBIDE MONONITRATE ER 30 MG PO TB24
15.0000 mg | ORAL_TABLET | Freq: Every day | ORAL | Status: DC
  Administered 2016-10-29 – 2016-10-30 (×2): 15 mg via ORAL
  Filled 2016-10-28 (×2): qty 1

## 2016-10-28 MED ORDER — SODIUM CHLORIDE 0.9 % IV SOLN
INTRAVENOUS | Status: AC
  Administered 2016-10-28: 100 mL/h via INTRAVENOUS

## 2016-10-28 MED ORDER — ACETAMINOPHEN 325 MG PO TABS: 650.0000 mg | ORAL_TABLET | ORAL | Status: DC | PRN

## 2016-10-28 MED ORDER — ASPIRIN 325 MG PO TABS
325.0000 mg | ORAL_TABLET | Freq: Every day | ORAL | Status: DC
  Administered 2016-10-29: 325 mg via ORAL
  Filled 2016-10-28: qty 1

## 2016-10-28 MED ORDER — ASPIRIN 300 MG RE SUPP: 300.0000 mg | Freq: Every day | RECTAL | Status: DC

## 2016-10-28 MED ORDER — ACETAMINOPHEN 160 MG/5ML PO SOLN: 650.0000 mg | ORAL | Status: DC | PRN

## 2016-10-28 MED ORDER — ACETAMINOPHEN 650 MG RE SUPP: 650.0000 mg | RECTAL | Status: DC | PRN

## 2016-10-28 MED ORDER — STROKE: EARLY STAGES OF RECOVERY BOOK
Freq: Once | Status: AC
  Administered 2016-10-29: 02:00:00
  Filled 2016-10-28: qty 1

## 2016-10-28 NOTE — Progress Notes (Signed)
Received report from ED nurse, Gretta Cool.

## 2016-10-28 NOTE — ED Triage Notes (Addendum)
Per EMS, Pt's family believes pt fell while outside shoveling snow. Pt was crawling to door to come in house. Since incident, Pt is confused, normally A&O. Pt gives inappropriate answers to questions. No injuries noted. Pt denies pain. BP 190/110. Cbg-138.

## 2016-10-28 NOTE — ED Notes (Signed)
Patient transported to CT 

## 2016-10-28 NOTE — ED Notes (Signed)
Patient transported to MRI 

## 2016-10-28 NOTE — ED Notes (Signed)
Patient returned from MRI.

## 2016-10-28 NOTE — ED Provider Notes (Signed)
[facility] DEPT Provider Note   CSN: [id number] Arrival date & time: 10/28/16  1728     History   Chief Complaint Chief Complaint  Patient presents with  . Altered Mental Status    HPI Jonathan [name] is a 83 y.o. male.  HPI   Patient is a 83 year old male with history of CAD s/p CABG, CVA, hypertension, hyperlipidemia and diabetes who presents to the ED via EMS from home with complaint of altered mental status. Son at bedside reports around 1 PM this afternoon his mother states when she looked out the window she saw the patient was playing on the ground on their front porch trying to crawl over to a chair. Patient unable to report if he fell or passed out. She notes since finding him on the ground patient began acting unusual. Patient appeared to have difficulty answering questions, states it took him a while to respond and was confused regarding what had happened. Patient denies any pain or complaints. Son reports when he got home from work around 3 PM his mother called him over to the patient's house to come and check on the patient. He states when he saw the patient he appeared confused which she notes is a significant change from his baseline mental status. Denies patient having history of confusion or dementia. Son also reports patient appeared weak and was having difficulty standing up from his chair. Denies use of anticoagulants, son reports patient was previously on Xarelto for episode of A. fib but states the med was d/c by his physician and he has not been taking it for over the past month.  Level V Caveat- AMS  Past Medical History:  Diagnosis Date  . Allergic rhinitis   . BPH (benign prostatic hypertrophy)   . Coronary atherosclerosis of native coronary artery   . Degeneration of lumbar or lumbosacral intervertebral disc   . Diabetes ([place])    no meds  . Dyslipidemia   . HOH (hard of hearing)   . Hyperlipidemia   . Hypertension   . Right leg weakness   . S/P CABG  x 4 2006   LIMA-LAD, SVG-rPDA, sSVG-OM1-OM2  . Stroke ([place])   . Wears glasses    reading    Patient Active Problem List   Diagnosis Date Noted  . Confusion 10/28/2016  . Syncope 10/28/2016  . Long-term (current) use of anticoagulants 10/17/2015  . Typical atrial flutter ([place])   . Diabetes mellitus without complication ([place]) [id number]  . Paroxysmal atrial fibrillation ([facility])   . Coronary atherosclerosis of native coronary artery 07/14/2013  . Mixed hyperlipidemia 07/14/2013  . Hypertensive heart disease without CHF     Past Surgical History:  Procedure Laterality Date  . bil carpal tunnel surgery    . CARDIAC CATHETERIZATION N/A 10/01/2015   Procedure: Left Heart Cath and Cors/Grafts Angiography;  Surgeon: Peter M Martinique, MD;  Location: [facility];  Service: Cardiovascular;  Laterality: N/A;  . COLONOSCOPY    . CORONARY ARTERY BYPASS GRAFT  2006    Coronary artery bypass grafting x 4 (left internal mammary  . DUPUYTREN CONTRACTURE RELEASE Left 04/05/2014   Procedure: EXCISION DUPUYTREN LEFT PALM, RING, SMALL, AND THUMB;  Surgeon: Cammie Sickle, MD;  Location: [facility];  Service: Orthopedics;  Laterality: Left;  . EYE SURGERY     both cataracts  . HEMORRHOID SURGERY    . LAMINECTOMY    . ROTATOR CUFF REPAIR    . TONSILLECTOMY    .  TRANSURETHRAL RESECTION OF PROSTATE         Home Medications    Prior to Admission medications   Medication Sig Start Date End Date Taking? Authorizing Provider  isosorbide mononitrate (IMDUR) 30 MG 24 hr tablet Take 1 tablet (30 mg total) by mouth daily. Patient taking differently: Take 15 mg by mouth daily.  10/04/15  Yes Kelvin Cellar, MD    Family History Family History  Problem Relation Age of Onset  . Hypertension Father     Social History Social History  Substance Use Topics  . Smoking status: Former Research scientist (life sciences)  . Smokeless tobacco: Never Used     Comment: Quit 1958  . Alcohol use 0.0 oz/week      Allergies   Amlodipine; Codeine; Metformin and related; Statins; and Welchol [colesevelam hcl]   Review of Systems Review of Systems  Unable to perform ROS: Mental status change     Physical Exam Updated Vital Signs BP 170/82 (BP Location: Left Arm)   Pulse 75   Temp 98 F (36.7 C)   Resp 14   SpO2 98%   Physical Exam  Constitutional: He appears well-developed and well-nourished.  HENT:  Head: Normocephalic and atraumatic. Head is without raccoon's eyes, without Battle's sign, without abrasion, without contusion and without laceration.  Right Ear: Tympanic membrane normal. No hemotympanum.  Left Ear: Tympanic membrane normal. No hemotympanum.  Nose: Nose normal.  Mouth/Throat: Uvula is midline, oropharynx is clear and moist and mucous membranes are normal. No oropharyngeal exudate, posterior oropharyngeal edema, posterior oropharyngeal erythema or tonsillar abscesses. No tonsillar exudate.  Eyes: Conjunctivae and EOM are normal. Pupils are equal, round, and reactive to light. Right eye exhibits no discharge. Left eye exhibits no discharge. No scleral icterus.  Neck: Normal range of motion. Neck supple.  Cardiovascular: Normal rate, regular rhythm, normal heart sounds and intact distal pulses.   Pulmonary/Chest: Effort normal and breath sounds normal. No respiratory distress. He has no wheezes. He has no rales. He exhibits no tenderness.  Abdominal: Soft. Bowel sounds are normal. He exhibits no distension and no mass. There is no tenderness. There is no rebound and no guarding. No hernia.  Musculoskeletal: Normal range of motion. He exhibits no edema.  No midline C, T, or L tenderness. Full range of motion of neck and back. Full range of motion of bilateral upper and lower extremities, with 5/5 strength. Sensation intact. 2+ radial and PT pulses. Cap refill <2 seconds.   Lymphadenopathy:    He has no cervical adenopathy.  Neurological: He is alert. He has normal strength  and normal reflexes. He displays no tremor. No cranial nerve deficit or sensory deficit. He displays a negative Romberg sign. Coordination normal. GCS eye subscore is 4. GCS verbal subscore is 5. GCS motor subscore is 6.  Pt alert, only oriented to self. Pt with slowed verbal responses to questions, switching of words and tangential responses. Pt with shuffling/limping gait on exam and appears to bear most of his weight on his right leg.  Skin: Skin is warm and dry.  Nursing note and vitals reviewed.    ED Treatments / Results  Labs (all labs ordered are listed, but only abnormal results are displayed) Labs Reviewed  CBC WITH DIFFERENTIAL/PLATELET - Abnormal; Notable for the following:       Result Value   Hemoglobin 12.9 (*)    HCT 38.5 (*)    All other components within normal limits  BASIC METABOLIC PANEL - Abnormal; Notable for the following:  Glucose, Bld 133 (*)    All other components within normal limits  URINALYSIS, ROUTINE W REFLEX MICROSCOPIC  COOXEMETRY PANEL  I-STAT TROPOININ, ED    EKG  EKG Interpretation  Date/Time:  Wednesday October 28 2016 17:55:07 EST Ventricular Rate:  80 PR Interval:    QRS Duration: 104 QT Interval:  379 QTC Calculation: 438 R Axis:   -21 Text Interpretation:  Sinus rhythm Borderline left axis deviation Abnormal R-wave progression, late transition Minimal ST elevation, inferior leads Otherwise no significant change Confirmed by [facility] MD, PEDRO ([id number]) on 10/28/2016 7:30:01 PM       Radiology Dg Chest 2 View  Result Date: 10/28/2016 CLINICAL DATA:  Altered mental status EXAM: CHEST  2 VIEW COMPARISON:  10/02/2015 FINDINGS: AP and lateral views of the chest show low volumes. The cardio pericardial silhouette is enlarged. There is pulmonary vascular congestion without overt pulmonary edema. Patient is status post CABG. IMPRESSION: No active cardiopulmonary disease. Electronically Signed   By: Misty Stanley M.D.   On: 10/28/2016 18:34    Ct Head Wo Contrast  Result Date: 10/28/2016 CLINICAL DATA:  Altered mental status possible fall EXAM: CT HEAD WITHOUT CONTRAST TECHNIQUE: Contiguous axial images were obtained from the base of the skull through the vertex without intravenous contrast. COMPARISON:  MRI 02/21/2016, CT 08/07/2015 FINDINGS: Brain: No acute territorial infarction, intracranial hemorrhage or extra-axial fluid collection is seen. No focal mass, mass effect or midline shift. Mild to moderate periventricular white matter hypodensity consistent with small vessel disease. Old left thalamic lacunar infarcts as before. Old left cerebellar infarct as before. Ventricles are similar in size compared to the prior study. There is mild atrophy. Vascular: No hyperdense vessels.  Carotid artery calcifications. Skull: Mastoid air cells clear.  No skull fracture Sinuses/Orbits: Mucosal thickening in the maxillary ethmoid and sphenoid sinuses. No acute orbital abnormality. Bilateral lens extraction. Other: None IMPRESSION: No definite CT evidence for acute intracranial abnormality Electronically Signed   By: Donavan Foil M.D.   On: 10/28/2016 18:49   Mr Brain Wo Contrast  Result Date: 10/28/2016 CLINICAL DATA:  [place] while shoveling snow. Confusion, change in speech. History of hypertension, hyperlipidemia. EXAM: MRI HEAD WITHOUT CONTRAST TECHNIQUE: Multiplanar, multiecho pulse sequences of the brain and surrounding structures were obtained without intravenous contrast. COMPARISON:  CT HEAD October 28, 2016 and MRI head Feb 21, 2016 FINDINGS: BRAIN: 15 x 11 mm focus of reduced diffusion mesial LEFT thalamus with low ADC values. 3 mm T2 hyperintensity LEFT thalamus most consistent with perivascular space, present on prior MRI. No susceptibility artifact to suggest hemorrhage. Ventricles and sulci are normal for patient's age. Old small LEFT cerebellar infarct. Patchy supratentorial white matter FLAIR T2 hyperintensities. No abnormal extra-axial  fluid collections. VASCULAR: Similar probable slow flow RIGHT vertebral artery, major intracranial vascular flow voids otherwise maintained. SKULL AND UPPER CERVICAL SPINE: No abnormal sellar expansion. No suspicious calvarial bone marrow signal. Craniocervical junction maintained. SINUSES/ORBITS: Trace paranasal sinus mucosal thickening. Mastoid air cells are well aerated. Status post bilateral ocular lens implants. The included ocular globes and orbital contents are non-suspicious. OTHER: None. IMPRESSION: Acute 15 x 11 mm LEFT thalamus infarct. Otherwise stable appearance of the brain including old small LEFT cerebellar infarct and mild to moderate chronic small vessel ischemic disease. Electronically Signed   By: Elon Alas M.D.   On: 10/28/2016 21:28    Procedures Procedures (including critical care time)  Medications Ordered in ED Medications - No data to display   Initial Impression /  Assessment and Plan / ED Course  I have reviewed the triage vital signs and the nursing notes.  Pertinent labs & imaging results that were available during my care of the patient were reviewed by me and considered in my medical decision making (see chart for details).  Clinical Course    Pt presents from home with reported new onset confusion and weakness that started around 1pm when his wife found his crawling on their front porch after he was sweeping off the snow. Pt unable to recall what happened during the event. Son reports pt is usually alert and oriented, active and states "he is a very healthy 83yo". PMH of CAD s/p CABG, CVA, hypertension, hyperlipidemia and diabetes. Denies use of anticoagulants, son notes he was on Xarelto for episode of afib last year but states the med was d/c a few months ago. Denies any other recent illness. VSS. On exam, pt appears to be in no acute distress, alert and oriented to self only. Pt follows commands, CNs intact, 5/5 strength to BUE and BLE, no abnormal  coordination with finger-nose-finger. Pt able to stand but noted to have bilateral leg weakness upon stand with limping/shuffling gait. Pt with FROM of BLE, nontender, no pelvic instability or tenderness. Pt with slowed verbal responses to questions, switching of words and tangential responses.   EKG showed sinus rhythm with slight ST elevation in lead III which appears unchanged from prior. Trop negative. CXR negative. No leukocytosis. UA without evidence of infection. CT head negative. Discussed pt with Dr. Leonette Monarch who also evaluated pt in the ED. On reevaluation, son reports pt was in garage for a short period of time earlier this afternoon and notes he had a wood burning stove that he uses to heat the garage, however son reports his mother noted the pt was not down there for very long. Will order carbon monoxide labs for further evaluation. Consulted neurology. Dr. Leonel Ramsay advised to order MRI brain and EEG and to admit pt to medicine for further workup of AMS. Consulted hospitalist, Dr. Loleta Books agrees to admission. Discussed results and plan for admission with pt and family.   Final Clinical Impressions(s) / ED Diagnoses   Final diagnoses:  Altered mental status, unspecified altered mental status type    New Prescriptions New Prescriptions   No medications on file     Nona Dell, [place] 10/28/16 2155    Fatima Blank, MD 10/30/16 1550

## 2016-10-28 NOTE — ED Notes (Signed)
Dr Loleta Books in with son.

## 2016-10-28 NOTE — Consult Note (Signed)
Neurology Consultation Reason for Consult: Altered mental status Referring Physician: Danford, C  CC: Altered mental status  History is obtained from: Patient  HPI: Jonathan [name] is a 83 y.o. male he was last known to be well sometime between 35 and 12:30 when he spoke to his son. Following shoveling some snow, he came inside and took off his coat. He then was seen by his wife to be crawling, and had difficulty standing up. He went to lay down for a couple of hours. Following this, he continued to be confused and therefore his wife called his son over. At that time, he was complaining of right-sided numbness. His speech did not make sense completely and therefore he was brought to the hospital via EMS.   LKW: 12 AM tpa given?: no, out of window   ROS: A 14 point ROS was performed and is negative except as noted in the HPI.   Past Medical History:  Diagnosis Date  . Allergic rhinitis   . BPH (benign prostatic hypertrophy)   . Coronary atherosclerosis of native coronary artery   . Degeneration of lumbar or lumbosacral intervertebral disc   . Diabetes ([place])    no meds  . Dyslipidemia   . HOH (hard of hearing)   . Hyperlipidemia   . Hypertension   . Right leg weakness   . S/P CABG x 4 2006   LIMA-LAD, SVG-rPDA, sSVG-OM1-OM2  . Stroke ([place])   . Wears glasses    reading     Family History  Problem Relation Age of Onset  . Hypertension Father      Social History:  reports that he has quit smoking. He has never used smokeless tobacco. He reports that he drinks alcohol. He reports that he does not use drugs.   Exam: Current vital signs: BP (!) 185/85 (BP Location: Left Arm)   Pulse 75   Temp 98.1 F (36.7 C) (Oral)   Resp 18   SpO2 98%  Vital signs in last 24 hours: Temp:  [98 F (36.7 C)-98.3 F (36.8 C)] 98.1 F (36.7 C) (01/17 2216) Pulse Rate:  [75-82] 75 (01/17 2215) Resp:  [12-21] 18 (01/17 2215) BP: (153-185)/(82-100) 185/85 (01/17 2215) SpO2:  [95 %-99  %] 98 % (01/17 2215)   Physical Exam  Constitutional: Appears well-developed and well-nourished.  Psych: Affect appropriate to situation Eyes: No scleral injection HENT: No OP obstrucion Head: Normocephalic.  Cardiovascular: Normal rate and regular rhythm.  Respiratory: Effort normal and breath sounds normal to anterior ascultation GI: Soft.  No distension. There is no tenderness.  Skin: WDI  Neuro: Mental Status: Patient is awake, alert, He has a moderate aphasia with frequent word substitution, preserved repetition, and relatively preserved understanding.  Cranial Nerves: II: Visual Fields are full. Pupils are equal, round, and reactive to light.   III,IV, VI: EOMI without ptosis or diploplia.  V: Facial sensation is symmetric to temperature VII: Facial movement is symmetric.  VIII: hearing is intact to voice X: Uvula elevates symmetrically XI: Shoulder shrug is symmetric. XII: tongue is midline without atrophy or fasciculations.  Motor: Tone is normal. Bulk is normal. 5/5 strength was present in all four extremities.  Sensory: Sensation is symmetric to light touch and temperature in the arms and legs. Cerebellar: No clear ataxia  I have reviewed labs in epic and the results pertinent to this consultation are: Bmp - unrmarkable  I have reviewed the images obtained: MRI brain - left thalamic infarct.   Impression: 83 year old  male with thalamic infarct in the setting of history of atrial fibrillation. The location is one I typically think of more in association with small vessel disease, but difficult to definitively rule out embolus.   Recommendations: 1. HgbA1c, fasting lipid panel 2. CTA head and neck 3. Frequent neuro checks 4. Echocardiogram 5. Prophylactic therapy-Antiplatelet med: Aspirin - dose 325mg  PO or 300mg  PR 6. Risk factor modification 7. Telemetry monitoring 8. PT consult, OT consult, Speech consult 9. please page stroke NP  Or  PA  Or MD  from 8am -4  pm starting 1/18 as this patient will be followed by the stroke team at this point.   You can look them up on [email or web address]      Roland Rack, MD [facility] [phone]  If 7pm- 7am, please page neurology on call as listed in [place].

## 2016-10-28 NOTE — ED Notes (Signed)
Nurse starting IV 

## 2016-10-28 NOTE — H&P (Signed)
History and Physical  Patient Name: Jonathan [name]     [id number]    DOB: June 02, 1929    DOA: 10/28/2016 PCP: Irven Shelling, MD   Patient coming from: Home     Chief Complaint: Altered mental status  HPI: Jonathan [name] is a 83 y.o. male with a past medical history significant for CAD s/p CABG in 2007, HTN, pAF no longer on Xarelto and diet controlled DM who presents with altered mental status.  History obtained from family, as patient has no ability to explain event.    The patient was in his usual state of health (lives with wife, independent, walks a mile/day, still drives, still does finances, negative dementia workup with Neuro 2 years ago) until this afternoon.    Per son, the patient was doing work around the house most of the morning, son saw him, he was normal.  Then around 1PM, he came in from shoveling outside, had taken off his coat and boots, and was in the sunroom, when wife found him crawling on the floor to his recliner.  He was able to pull himself into the recliner, was tachycardic per wife, told her he "thought he passed out", but was otherwise incoherent and couldn't give any details of the event.  He was relatively oriented, and so his wife let him nap for a few hours, but when he woke up, he was still somewhat disoriented, and so she called her son over who describes that he was oriented to his surroundings and his son, but kept repeating "I just can't think of the word" and when asked about what had happened, he conflated things he had done earlier in the day or week.  EMS arrived, he had a negative stroke screen, but couldn't state the date, his DOB, or the president, and kept repeating "I can't think of the word."  ED course: -Afebrile, heart rate 82, respirations and pulse ox normal, BP 157/100 -Na 139, K 3.9, Cr 0.71, WBC 8.6K, Hgb 12.9 -COox testing done because of some initial concern for CO poisoning, but this was normal -Troponin negative, ECG  unchanged from previous -CT head unremarkable -The case was discussed with Neurology who recommended MRI brain and EEG and observation overnight   The patient was diagnosed with pAF in the setting of pneumonia 2 years ago.  Eventually tapered carvedilol and actually stopped Xarelto recently.  Additionally, he was evaluated by Neuro 2 years ago for dementia and foot drop.  MRI brain at the time showed age appropriate changes/microvascular disease, no acute findings.  MMSE was 30/30 and he was prescribed PT.     Review of systems:  Review of Systems  Constitutional: Negative for chills and fever.  Respiratory: Negative for shortness of breath.   Cardiovascular: Negative for chest pain and palpitations.  Neurological: Positive for loss of consciousness (uncertain, unwitnessed, patient can't say). Negative for dizziness, tingling, sensory change, speech change, focal weakness, seizures and headaches.  All other systems reviewed and are negative.        Past Medical History:  Diagnosis Date  . Allergic rhinitis   . BPH (benign prostatic hypertrophy)   . Coronary atherosclerosis of native coronary artery   . Degeneration of lumbar or lumbosacral intervertebral disc   . Diabetes ([place])    no meds  . Dyslipidemia   . HOH (hard of hearing)   . Hyperlipidemia   . Hypertension   . Right leg weakness   . S/P CABG x 4 2006  LIMA-LAD, SVG-rPDA, sSVG-OM1-OM2  . Stroke ([place])   . Wears glasses    reading    Past Surgical History:  Procedure Laterality Date  . bil carpal tunnel surgery    . CARDIAC CATHETERIZATION N/A 10/01/2015   Procedure: Left Heart Cath and Cors/Grafts Angiography;  Surgeon: Peter M Martinique, MD;  Location: [facility];  Service: Cardiovascular;  Laterality: N/A;  . COLONOSCOPY    . CORONARY ARTERY BYPASS GRAFT  2006    Coronary artery bypass grafting x 4 (left internal mammary  . DUPUYTREN CONTRACTURE RELEASE Left 04/05/2014   Procedure: EXCISION  DUPUYTREN LEFT PALM, RING, SMALL, AND THUMB;  Surgeon: Cammie Sickle, MD;  Location: [facility];  Service: Orthopedics;  Laterality: Left;  . EYE SURGERY     both cataracts  . HEMORRHOID SURGERY    . LAMINECTOMY    . ROTATOR CUFF REPAIR    . TONSILLECTOMY    . TRANSURETHRAL RESECTION OF PROSTATE      Social History: Patient lives with his wife.  Patient walks unasssited.  He walks a mile per day.  He smoked cigars, long time ago.  He is from [place], went to [place].  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  POA is his wife.  Allergies  Allergen Reactions  . Amlodipine Other (See Comments)    Fatigue   . Codeine     unknown  . Metformin And Related Other (See Comments)    fatigue  . Statins     "pass out, dizzy, drunk feeling, poor balance"  . Welchol [Colesevelam Hcl]     Family history: family history includes Hypertension in his father.  Prior to Admission medications   Medication Sig Start Date End Date Taking? Authorizing Provider  isosorbide mononitrate (IMDUR) 30 MG 24 hr tablet Take 1 tablet (30 mg total) by mouth daily. Patient taking differently: Take 15 mg by mouth daily.  10/04/15  Yes Kelvin Cellar, MD     Physical Exam: BP 177/97   Pulse 80   Temp 98.3 F (36.8 C) (Oral)   Resp 12   SpO2 99%  General appearance: Well-developed, elderly adult male, awake and alert to circumstances, in no acute distress.   Eyes: Anicteric, conjunctiva pink, lids and lashes normal. PERRL.    ENT: No nasal deformity, discharge, epistaxis.  Hearing normal. OP moist without lesions.   Dentition normal, upper dentures. Lymph: No cervical, supraclavicular or axillary lymphadenopathy. Skin: Warm and dry.  No jaundice.  No suspicious rashes or lesions. Cardiac: RRR, nl S1-S2, no murmurs appreciated.  Capillary refill is brisk.  JVP normal.  No LE edema.  Radial and DP pulses 2+ and symmetric.  No carotid bruits. Respiratory: Normal respiratory  rate and rhythm.  CTAB without rales or wheezes.  Diminihsed/poor effort. GI: Abdomen soft without rigidity.  No TTP. No ascites, distension, no hepatosplenomegaly.   MSK: No deformities or effusions. Neuro: Pupils are 4 mm and reactive to 3 mm. Extraocular movements are intact, without nystagmus. Cranial nerve 5 is within normal limits. Cranial nerve 7 is symmetrical. Cranial nerve 8 is within normal limits. Cranial nerves 9 and 10 reveal equal palate elevation. Cranial nerve 11 reveals sternocleidomastoid strong. Cranial nerve 12 is midline. I do not note a deficit in motor strength testing in the upper and lower extremities bilaterally with normal motor, tone and bulk. Romberg maneuver is negative for pathology. Finger-to-nose testing is within normal limits. Speech is fluent. Naming is  Intact  for "hat", "shoe".  Calls a pen a pencil.  Oriented to self, says that he is in the "[place] [facility]", doesn't say hospital.  States that his son is his "brother" but uses correct name. When asked if anything out of the ordinary happened today, states "no". Attention span and concentration seem within normal limits.   Psych: Behavior seems appropriate.  Affect pleasant.  No evidence of aural or visual hallucinations or delusions.       Labs on Admission:  I have personally reviewed following labs and imaging studies: CBC:  Recent Labs Lab 10/28/16 1754  WBC 8.6  NEUTROABS 6.6  HGB 12.9*  HCT 38.5*  MCV 79.2  PLT [id number]   Basic Metabolic Panel:  Recent Labs Lab 10/28/16 1754  NA 139  K 3.9  CL 104  CO2 27  GLUCOSE 133*  BUN 15  CREATININE 0.71  CALCIUM 9.2   GFR: CrCl cannot be calculated (Unknown ideal weight.). Liver Function Tests: No results for input(s): AST, ALT, ALKPHOS, BILITOT, PROT, ALBUMIN in the last 168 hours. No results for input(s): LIPASE, AMYLASE in the last 168 hours. No results for input(s): AMMONIA in the last 168 hours. Coagulation Profile: No  results for input(s): INR, PROTIME in the last 168 hours. Cardiac Enzymes: No results for input(s): CKTOTAL, CKMB, CKMBINDEX, TROPONINI in the last 168 hours. BNP (last 3 results) No results for input(s): PROBNP in the last 8760 hours. HbA1C: No results for input(s): HGBA1C in the last 72 hours. CBG: No results for input(s): GLUCAP in the last 168 hours. Lipid Profile: No results for input(s): CHOL, HDL, LDLCALC, TRIG, CHOLHDL, LDLDIRECT in the last 72 hours. Thyroid Function Tests: No results for input(s): TSH, T4TOTAL, FREET4, T3FREE, THYROIDAB in the last 72 hours. Anemia Panel: No results for input(s): VITAMINB12, FOLATE, FERRITIN, TIBC, IRON, RETICCTPCT in the last 72 hours. Sepsis Labs: Invalid input(s): PROCALCITONIN, LACTICIDVEN No results found for this or any previous visit (from the past 240 hour(s)).    Radiological Exams on Admission: Personally reviewed CXR shows no focal opacity; CT head report reviewed: Dg Chest 2 View  Result Date: 10/28/2016 CLINICAL DATA:  Altered mental status EXAM: CHEST  2 VIEW COMPARISON:  10/02/2015 FINDINGS: AP and lateral views of the chest show low volumes. The cardio pericardial silhouette is enlarged. There is pulmonary vascular congestion without overt pulmonary edema. Patient is status post CABG. IMPRESSION: No active cardiopulmonary disease. Electronically Signed   By: Misty Stanley M.D.   On: 10/28/2016 18:34   Ct Head Wo Contrast  Result Date: 10/28/2016 CLINICAL DATA:  Altered mental status possible fall EXAM: CT HEAD WITHOUT CONTRAST TECHNIQUE: Contiguous axial images were obtained from the base of the skull through the vertex without intravenous contrast. COMPARISON:  MRI 02/21/2016, CT 08/07/2015 FINDINGS: Brain: No acute territorial infarction, intracranial hemorrhage or extra-axial fluid collection is seen. No focal mass, mass effect or midline shift. Mild to moderate periventricular white matter hypodensity consistent with small  vessel disease. Old left thalamic lacunar infarcts as before. Old left cerebellar infarct as before. Ventricles are similar in size compared to the prior study. There is mild atrophy. Vascular: No hyperdense vessels.  Carotid artery calcifications. Skull: Mastoid air cells clear.  No skull fracture Sinuses/Orbits: Mucosal thickening in the maxillary ethmoid and sphenoid sinuses. No acute orbital abnormality. Bilateral lens extraction. Other: None IMPRESSION: No definite CT evidence for acute intracranial abnormality Electronically Signed   By: Donavan Foil M.D.   On: 10/28/2016 18:49  EKG: Independently reviewed. Rate 80, QTc 438, inferior STE that are old and similar to previous ECG.    Assessment/Plan  1. Possible TIA vs syncope:  There are no metabolic abnormalities or evidence of infection. Coox normal.  Patient unable to state circumstances of his acute change in mental status. One report states that he initially told wife he "passed out" or "almost passed out".  Neuro exam normal now except aphasia.  Seems to be a very prolonged period of time to be confused after a simple vasovagal episode, but this is considered.  MRI pending.   -Admit to telemetry -Neuro checks, NIHSS per protocol -Check MRI brain and EEG -Gentle fluids -Check echocardiogram  -Check orthostatics -PT/OT/SLP consultation -Consult to Neurology, appreciate recommendations -Daily aspirin 325 mg for now -Check lipids, hemoglobin A1c   2. Hypertension and coronary secondary prevention:  Not on aspirin, BB, or statin. -Continue Imdur  3. Diabetes:  HgbA1c 8% before, not currently on meds. -Check HgbA1c  4. Paroxysmal atrial fibrillation: CHADS2Vasc would be 5.  Saw Dr. Wynonia Lawman, but does not have Cards follow up.  Never had an event monitor.  Discussed risk per year of stroke with his CHADS score, vs risk with anticoagulation with his primary Cardiologist, and chose not to take Xarelto anymore because of  fatigue.       DVT prophylaxis: Lovenox  Code Status: FULL for now, son will discuss with wife  Family Communication: Son at bedsdie  Disposition Plan: Anticipate work up as above and consult to ancillary services.  Expect discharge within 1-2 days. Consults called: Neurology, Dr. Leonel Ramsay has seen patient. Admission status: Telemetry, OBS status  Core measures: -VTE prophylaxis ordered at time of admission -Aspirin ordered at admission -Atrial fibrillation: previously present, patient off anticoagulation -tPA not given because of minor symptoms -Dysphagia screen ordered in ER -Lipids ordered -PT eval ordered     Medical decision making: Patient seen at 9:10 PM on 10/28/2016.  The patient was discussed with Harlene Ramus, PA-C. What exists of the patient's chart was reviewed in depth and summarized above.  Clinical condition: stable.       Edwin Dada [facility] Pager [phone]

## 2016-10-29 ENCOUNTER — Observation Stay (HOSPITAL_COMMUNITY): Payer: Medicare Other

## 2016-10-29 DIAGNOSIS — H9190 Unspecified hearing loss, unspecified ear: Secondary | ICD-10-CM | POA: Diagnosis not present

## 2016-10-29 DIAGNOSIS — E1151 Type 2 diabetes mellitus with diabetic peripheral angiopathy without gangrene: Secondary | ICD-10-CM | POA: Diagnosis present

## 2016-10-29 DIAGNOSIS — R4189 Other symptoms and signs involving cognitive functions and awareness: Secondary | ICD-10-CM | POA: Diagnosis not present

## 2016-10-29 DIAGNOSIS — E785 Hyperlipidemia, unspecified: Secondary | ICD-10-CM | POA: Diagnosis not present

## 2016-10-29 DIAGNOSIS — I69351 Hemiplegia and hemiparesis following cerebral infarction affecting right dominant side: Secondary | ICD-10-CM | POA: Diagnosis not present

## 2016-10-29 DIAGNOSIS — I480 Paroxysmal atrial fibrillation: Secondary | ICD-10-CM | POA: Diagnosis not present

## 2016-10-29 DIAGNOSIS — R410 Disorientation, unspecified: Secondary | ICD-10-CM

## 2016-10-29 DIAGNOSIS — F09 Unspecified mental disorder due to known physiological condition: Secondary | ICD-10-CM | POA: Diagnosis not present

## 2016-10-29 DIAGNOSIS — I119 Hypertensive heart disease without heart failure: Secondary | ICD-10-CM | POA: Diagnosis not present

## 2016-10-29 DIAGNOSIS — I2510 Atherosclerotic heart disease of native coronary artery without angina pectoris: Secondary | ICD-10-CM | POA: Diagnosis not present

## 2016-10-29 DIAGNOSIS — F05 Delirium due to known physiological condition: Secondary | ICD-10-CM | POA: Diagnosis not present

## 2016-10-29 DIAGNOSIS — I639 Cerebral infarction, unspecified: Secondary | ICD-10-CM | POA: Diagnosis not present

## 2016-10-29 DIAGNOSIS — Z951 Presence of aortocoronary bypass graft: Secondary | ICD-10-CM | POA: Diagnosis not present

## 2016-10-29 DIAGNOSIS — E119 Type 2 diabetes mellitus without complications: Secondary | ICD-10-CM | POA: Diagnosis not present

## 2016-10-29 DIAGNOSIS — I10 Essential (primary) hypertension: Secondary | ICD-10-CM | POA: Diagnosis present

## 2016-10-29 DIAGNOSIS — E1165 Type 2 diabetes mellitus with hyperglycemia: Secondary | ICD-10-CM | POA: Diagnosis not present

## 2016-10-29 DIAGNOSIS — Z79899 Other long term (current) drug therapy: Secondary | ICD-10-CM | POA: Diagnosis not present

## 2016-10-29 DIAGNOSIS — Z9842 Cataract extraction status, left eye: Secondary | ICD-10-CM | POA: Diagnosis not present

## 2016-10-29 DIAGNOSIS — I6330 Cerebral infarction due to thrombosis of unspecified cerebral artery: Secondary | ICD-10-CM | POA: Diagnosis present

## 2016-10-29 DIAGNOSIS — G8191 Hemiplegia, unspecified affecting right dominant side: Secondary | ICD-10-CM | POA: Diagnosis not present

## 2016-10-29 DIAGNOSIS — G9340 Encephalopathy, unspecified: Secondary | ICD-10-CM | POA: Diagnosis not present

## 2016-10-29 DIAGNOSIS — I69319 Unspecified symptoms and signs involving cognitive functions following cerebral infarction: Secondary | ICD-10-CM | POA: Diagnosis not present

## 2016-10-29 DIAGNOSIS — I25810 Atherosclerosis of coronary artery bypass graft(s) without angina pectoris: Secondary | ICD-10-CM | POA: Diagnosis not present

## 2016-10-29 DIAGNOSIS — Z8673 Personal history of transient ischemic attack (TIA), and cerebral infarction without residual deficits: Secondary | ICD-10-CM | POA: Diagnosis not present

## 2016-10-29 DIAGNOSIS — R55 Syncope and collapse: Secondary | ICD-10-CM | POA: Diagnosis not present

## 2016-10-29 DIAGNOSIS — Z8249 Family history of ischemic heart disease and other diseases of the circulatory system: Secondary | ICD-10-CM | POA: Diagnosis not present

## 2016-10-29 DIAGNOSIS — Z87891 Personal history of nicotine dependence: Secondary | ICD-10-CM | POA: Diagnosis not present

## 2016-10-29 DIAGNOSIS — R4182 Altered mental status, unspecified: Secondary | ICD-10-CM | POA: Diagnosis present

## 2016-10-29 DIAGNOSIS — I69398 Other sequelae of cerebral infarction: Secondary | ICD-10-CM | POA: Diagnosis not present

## 2016-10-29 DIAGNOSIS — R4701 Aphasia: Secondary | ICD-10-CM | POA: Diagnosis not present

## 2016-10-29 LAB — LIPID PANEL
Cholesterol: 183 mg/dL (ref 0–200)
HDL: 25 mg/dL — ABNORMAL LOW (ref >40)
LDL CALC: 126 mg/dL — AB (ref 0–99)
Total CHOL/HDL Ratio: 7.3 RATIO
Triglycerides: 161 mg/dL — ABNORMAL HIGH (ref <150)
VLDL: 32 mg/dL (ref 0–40)

## 2016-10-29 LAB — ECHOCARDIOGRAM COMPLETE
Height: 71 in
WEIGHTICAEL: 2638.4 [oz_av]

## 2016-10-29 MED ORDER — ENSURE ENLIVE PO LIQD
237.0000 mL | Freq: Two times a day (BID) | ORAL | Status: DC
  Administered 2016-10-29 – 2016-10-30 (×3): 237 mL via ORAL

## 2016-10-29 MED ORDER — IOPAMIDOL (ISOVUE-370) INJECTION 76%
INTRAVENOUS | Status: AC
  Administered 2016-10-29: 50 mL
  Filled 2016-10-29: qty 100

## 2016-10-29 MED ORDER — APIXABAN 5 MG PO TABS
5.0000 mg | ORAL_TABLET | Freq: Two times a day (BID) | ORAL | Status: DC
  Administered 2016-10-29 – 2016-10-30 (×2): 5 mg via ORAL
  Filled 2016-10-29 (×2): qty 1

## 2016-10-29 MED ORDER — ASPIRIN EC 81 MG PO TBEC
81.0000 mg | DELAYED_RELEASE_TABLET | Freq: Every day | ORAL | Status: DC
  Administered 2016-10-30: 81 mg via ORAL
  Filled 2016-10-29: qty 1

## 2016-10-29 MED ORDER — PRAVASTATIN SODIUM 20 MG PO TABS
20.0000 mg | ORAL_TABLET | Freq: Every day | ORAL | Status: DC
  Administered 2016-10-29 – 2016-10-30 (×2): 20 mg via ORAL
  Filled 2016-10-29 (×2): qty 1

## 2016-10-29 NOTE — Consult Note (Signed)
Physical Medicine and Rehabilitation Consult Reason for Consult: Acute left thalamic infarct Referring Physician: [facility]   HPI: Jonathan [name] is a 83 y.o. right handed male with history of CAD status post CABG 2007, hypertension, PAF on Xarelto in the past. Presented 10/28/2016 with altered mental status right-sided numbness and slurred speech. Per chart review patient lives with her 79 year old wife. Reported to be independent prior to admission still driving. Son lives next door and works during the day. MRI imaging revealed acute 15 x 11 mm left thalamus infarct. CT angiogram head and neck showed right posterior cerebral artery P1 occlusion. Atherosclerotic type changes. 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 Patient did not receive TPA. EEG and echocardiogram are pending. Neurology follow-up currently on aspirin for CVA prophylaxis. Subcutaneous Lovenox for DVT prophylaxis. Tolerating a regular consistency diet. Physical therapy evaluation completed 10/29/2016 with recommendations of physical medicine rehabilitation consult.   Review of Systems  Constitutional: Negative for chills, diaphoresis and fever.  HENT: Positive for hearing loss.   Eyes: Negative for blurred vision and double vision.  Respiratory: Negative for cough and shortness of breath.   Cardiovascular: Positive for palpitations and leg swelling. Negative for chest pain.  Gastrointestinal: Positive for constipation. Negative for nausea and vomiting.  Genitourinary: Positive for frequency and urgency.  Musculoskeletal: Positive for falls, joint pain and myalgias.  Skin: Negative for rash.  Neurological: Positive for speech change. Negative for seizures and loss of consciousness.  Psychiatric/Behavioral: Positive for memory loss.  All other systems reviewed and are negative.  Past Medical History:  Diagnosis Date  . Allergic rhinitis   . BPH (benign prostatic hypertrophy)   . Coronary atherosclerosis of  native coronary artery   . Degeneration of lumbar or lumbosacral intervertebral disc   . Diabetes ([place])    no meds  . Dyslipidemia   . HOH (hard of hearing)   . Hyperlipidemia   . Hypertension   . Right leg weakness   . S/P CABG x 4 2006   LIMA-LAD, SVG-rPDA, sSVG-OM1-OM2  . Stroke ([place])   . Wears glasses    reading   Past Surgical History:  Procedure Laterality Date  . bil carpal tunnel surgery    . CARDIAC CATHETERIZATION N/A 10/01/2015   Procedure: Left Heart Cath and Cors/Grafts Angiography;  Surgeon: Peter M Martinique, MD;  Location: [facility];  Service: Cardiovascular;  Laterality: N/A;  . COLONOSCOPY    . CORONARY ARTERY BYPASS GRAFT  2006    Coronary artery bypass grafting x 4 (left internal mammary  . DUPUYTREN CONTRACTURE RELEASE Left 04/05/2014   Procedure: EXCISION DUPUYTREN LEFT PALM, RING, SMALL, AND THUMB;  Surgeon: Cammie Sickle, MD;  Location: [facility];  Service: Orthopedics;  Laterality: Left;  . EYE SURGERY     both cataracts  . HEMORRHOID SURGERY    . LAMINECTOMY    . ROTATOR CUFF REPAIR    . TONSILLECTOMY    . TRANSURETHRAL RESECTION OF PROSTATE     Family History  Problem Relation Age of Onset  . Hypertension Father    Social History:  reports that he has quit smoking. He has never used smokeless tobacco. He reports that he drinks alcohol. He reports that he does not use drugs. Allergies:  Allergies  Allergen Reactions  . Amlodipine Other (See Comments)    Fatigue   . Codeine     unknown  . Metformin And Related Other (See Comments)  fatigue  . Statins     "pass out, dizzy, drunk feeling, poor balance"  . Welchol [Colesevelam Hcl]    Medications Prior to Admission  Medication Sig Dispense Refill  . isosorbide mononitrate (IMDUR) 30 MG 24 hr tablet Take 1 tablet (30 mg total) by mouth daily. (Patient taking differently: Take 15 mg by mouth daily. ) 30 tablet 1    Home: [place] expects to  be discharged to:: Private residence Living Arrangements: Spouse/significant other (pt unable to recall, detail provided by son) Available Help at Discharge: Family, Available 24 hours/day Type of Home: House Home Access: Level entry Home Layout: One level Bathroom Shower/Tub: Multimedia programmer: Handicapped height Home Equipment: Shower seat Additional Comments: Pt unable to recall living situation, details provided by son.   Functional History: Prior Function Level of Independence: Independent Comments: enjoys Dealer work Functional Status:  Mobility: Bed Mobility Overal bed mobility: Needs Assistance Bed Mobility: Sit to Supine Supine to sit: Min guard Sit to supine: Min assist General bed mobility comments: Pt using rail for assist Transfers Overall transfer level: Needs assistance Equipment used: Rolling walker (2 wheeled), None Transfers: Sit to/from Stand Sit to Stand: Min assist General transfer comment: repeated cues for safety needed, pt not consistenlty following commands Ambulation/Gait Ambulation/Gait assistance: Museum/gallery curator (Feet): 110 Feet Assistive device: Rolling walker (2 wheeled) Gait Pattern/deviations: Step-through pattern, Decreased step length - right, Decreased step length - left, Trunk flexed General Gait Details: Pt having difficulty with Rt UE grip of rw, poor safety awareness using rw, assist needed to avoid objects in room.  Gait velocity: decreased    ADL: ADL Overall ADL's : Needs assistance/impaired Eating/Feeding: Set up, Sitting Eating/Feeding Details (indicate cue type and reason): Son reports pt having difficulty self feeding with use of R hand but able to complete with increased time. Grooming: Min guard, Sitting Upper Body Bathing: Min guard, Sitting Lower Body Bathing: Sit to/from stand Upper Body Dressing : Min guard, Sitting Lower Body Dressing: Sit to/from stand, Moderate assistance Lower Body  Dressing Details (indicate cue type and reason): Min assist to don socks sitting EOB Toilet Transfer: Minimal assistance, Ambulation, BSC, RW, Cueing for safety, Cueing for sequencing Toilet Transfer Details (indicate cue type and reason): Simulated by sit to stand from EOB with short distance functional mobility in room. Functional mobility during ADLs: Minimal assistance, Rolling walker, Cueing for safety, Cueing for sequencing General ADL Comments: Discussed post acute rehab with pt and son; they are agreeable.  Cognition: Cognition Overall Cognitive Status: Impaired/Different from baseline Orientation Level: Oriented to person, Disoriented to time, Disoriented to situation, Disoriented to place Cognition Arousal/Alertness: Awake/alert Behavior During Therapy: Flat affect Overall Cognitive Status: Impaired/Different from baseline Area of Impairment: Orientation, Following commands, Safety/judgement, Awareness Orientation Level: Disoriented to, Person, Time, Situation Memory: Decreased short-term memory Following Commands: Follows one step commands inconsistently Safety/Judgement: Decreased awareness of safety, Decreased awareness of deficits Awareness: Intellectual Problem Solving: Slow processing, Requires verbal cues, Requires tactile cues General Comments: Pt unable to identify "cactus" "hammock" on NIH picture sheet. Difficulty with DOB. Cues for sequencing and initiation.  Blood pressure (!) 155/71, pulse 76, temperature 98.2 F (36.8 C), temperature source Oral, resp. rate 20, height _0  (1.803 m), weight 74.8 kg (164 lb 14.4 oz), SpO2 95 %. Physical Exam  HENT:  Head: Normocephalic.  Eyes: Conjunctivae and EOM are normal. Pupils are equal, round, and reactive to light.  Neck: Normal range of motion. Neck supple. No thyromegaly present.  Cardiovascular:  Cardiac rate controlled  Respiratory: Effort normal and breath sounds normal. No respiratory distress.  GI: Soft. Bowel  sounds are normal. He exhibits no distension.  Neurological: He is alert.  Makes good eye contact with examiner. He was able to provide his name and age needed some subtle prompting for date of birth. Follows simple commands but delays in processing. Decreased awareness. Speech slurred. Seemed to display expressive language deficits also. Motor 4+/5 RUE and 4 to 4+/5 RLE. LUE and LLE 4+ to 5/5. Sensation diminished trace amount RUE, RLE. +pronator drift.  Skin: Skin is warm and dry.    Results for orders placed or performed during the hospital encounter of 10/28/16 (from the past 24 hour(s))  CBC with Differential     Status: Abnormal   Collection Time: 10/28/16  5:54 PM  Result Value Ref Range   WBC 8.6 4.0 - 10.5 K/uL   RBC 4.86 4.22 - 5.81 MIL/uL   Hemoglobin 12.9 (L) 13.0 - 17.0 g/dL   HCT 38.5 (L) 39.0 - 52.0 %   MCV 79.2 78.0 - 100.0 fL   MCH 26.5 26.0 - 34.0 pg   MCHC 33.5 30.0 - 36.0 g/dL   RDW 14.0 11.5 - 15.5 %   Platelets 241 150 - 400 K/uL   Neutrophils Relative % 76 %   Neutro Abs 6.6 1.7 - 7.7 K/uL   Lymphocytes Relative 15 %   Lymphs Abs 1.3 0.7 - 4.0 K/uL   Monocytes Relative 6 %   Monocytes Absolute 0.5 0.1 - 1.0 K/uL   Eosinophils Relative 2 %   Eosinophils Absolute 0.2 0.0 - 0.7 K/uL   Basophils Relative 1 %   Basophils Absolute 0.0 0.0 - 0.1 K/uL  Basic metabolic panel     Status: Abnormal   Collection Time: 10/28/16  5:54 PM  Result Value Ref Range   Sodium 139 135 - 145 mmol/L   Potassium 3.9 3.5 - 5.1 mmol/L   Chloride 104 101 - 111 mmol/L   CO2 27 22 - 32 mmol/L   Glucose, Bld 133 (H) 65 - 99 mg/dL   BUN 15 6 - 20 mg/dL   Creatinine, Ser 0.71 0.61 - 1.24 mg/dL   Calcium 9.2 8.9 - 10.3 mg/dL   GFR calc non Af Amer >60 >60 mL/min   GFR calc Af Amer >60 >60 mL/min   Anion gap 8 5 - 15  I-stat troponin, ED     Status: None   Collection Time: 10/28/16  6:21 PM  Result Value Ref Range   Troponin i, poc 0.00 0.00 - 0.08 ng/mL   Comment 3            Urinalysis, Routine w reflex microscopic     Status: None   Collection Time: 10/28/16  7:00 PM  Result Value Ref Range   Color, Urine YELLOW YELLOW   APPearance CLEAR CLEAR   Specific Gravity, Urine 1.014 1.005 - 1.030   pH 7.0 5.0 - 8.0   Glucose, UA NEGATIVE NEGATIVE mg/dL   Hgb urine dipstick NEGATIVE NEGATIVE   Bilirubin Urine NEGATIVE NEGATIVE   Ketones, ur NEGATIVE NEGATIVE mg/dL   Protein, ur NEGATIVE NEGATIVE mg/dL   Nitrite NEGATIVE NEGATIVE   Leukocytes, UA NEGATIVE NEGATIVE  .Cooxemetry Panel (carboxy, met, total hgb, O2 sat)     Status: None   Collection Time: 10/28/16  8:25 PM  Result Value Ref Range   Total hemoglobin 12.3 12.0 - 16.0 g/dL   O2 Saturation 95.3 %  Carboxyhemoglobin 1.1 0.5 - 1.5 %   Methemoglobin 1.0 0.0 - 1.5 %  Lipid panel     Status: Abnormal   Collection Time: 10/29/16  6:49 AM  Result Value Ref Range   Cholesterol 183 0 - 200 mg/dL   Triglycerides 161 (H) <150 mg/dL   HDL 25 (L) >40 mg/dL   Total CHOL/HDL Ratio 7.3 RATIO   VLDL 32 0 - 40 mg/dL   LDL Cholesterol 126 (H) 0 - 99 mg/dL   Ct Angio Head W Or Wo Contrast  Addendum Date: 10/29/2016   ADDENDUM REPORT: 10/29/2016 02:03 ADDENDUM: These results were called by telephone at the time of interpretation on 10/29/2016 at 2:02 am to Dr. Loleta Books, who verbally acknowledged these results. Electronically Signed   By: Kristine Garbe M.D.   On: 10/29/2016 02:03   Result Date: 10/29/2016 CLINICAL DATA:  83 y/o  M; stroke patient with follow-up scan. EXAM: CT ANGIOGRAPHY HEAD AND NECK TECHNIQUE: Multidetector CT imaging of the head and neck was performed using the standard protocol during bolus administration of intravenous contrast. Multiplanar CT image reconstructions and MIPs were obtained to evaluate the vascular anatomy. Carotid stenosis measurements (when applicable) are obtained utilizing NASCET criteria, using the distal internal carotid diameter as the denominator. CONTRAST:  50 cc  Isovue 370 COMPARISON:  10/28/2016 MRI of the head FINDINGS: CTA NECK FINDINGS Aortic arch: Severe aortic atherosclerosis with calcification and shaggy fibrofatty plaque. Bovine aortic arch, normal variant. Common origin of left vertebral artery and left subclavian artery. No significant stenosis of great vessel origins. Right carotid system: No evidence of dissection, stenosis (50% or greater) or occlusion. Mild mixed plaque of the bifurcation. Left carotid system: No evidence of dissection, stenosis (50% or greater) or occlusion. Predominantly fibrofatty plaque of the bifurcation with mild 40% stenosis of proximal ICA. Vertebral arteries: Right vertebral origin mixed plaque with severe stenosis (series 8, image 140). No evidence of dissection or occlusion. Skeleton: Moderate cervical spondylosis with discogenic and facet degenerative changes greatest at the C5 through C7 levels. No high-grade bony canal stenosis or foraminal narrowing. Other neck: Lipoma centered in the right anterior neck at the level of larynx anterior to sternocleidomastoid muscle measuring 40 x 21 x 42 mm (AP x ML x CC) series 5, image 70 and series 8, image 100. Subcentimeter calcified thyroid nodules. Upper chest: Negative. Review of the MIP images confirms the above findings CTA HEAD FINDINGS Anterior circulation: Calcified plaque of bilateral cavernous and paraclinoid internal carotid artery is without high-grade stenosis. Irregularity of bilateral M1 segments with distal right M1 mild stenosis and no significant left-sided stenosis. Several short segments of mild stenosis of bilateral ACA and MCA. Posterior circulation: Irregularity of right V4 segment with mild stenosis. Calcified plaque of left V4 segment without significant stenosis. Mild narrowing of the upper basilar artery. Severe stenosis of proximal right AICA. Short segment of moderate stenosis of left P1 segment. Proximal right P1 occlusion (series 7, image 101 and series 11,  image 26). Venous sinuses: As permitted by contrast timing, patent. Anatomic variants: Small anterior communicating artery. No posterior communicating artery identified, likely hypoplastic or absent. Delayed phase: No abnormal intracranial enhancement. Review of the MIP images confirms the above findings IMPRESSION: 1. Right posterior cerebral artery P1 occlusion. 2. Otherwise no evidence for large vessel occlusion, aneurysm, or vascular malformation of circle of Willis. 3. Extensive intracranial atherosclerosis with multiple segments of stenosis in the anterior and posterior circulation. 4. Severe aortic atherosclerosis with calcified and shaggy fibrofatty  plaque. 5. Mild 40% stenosis of proximal left ICA. 6. 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 7. Otherwise carotid and vertebral arteries of the neck are patent without evidence of occlusion, aneurysm, dissection, or high-grade stenosis. 8. 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 Electronically Signed: By: Kristine Garbe M.D. On: 10/29/2016 01:53   Dg Chest 2 View  Result Date: 10/28/2016 CLINICAL DATA:  Altered mental status EXAM: CHEST  2 VIEW COMPARISON:  10/02/2015 FINDINGS: AP and lateral views of the chest show low volumes. The cardio pericardial silhouette is enlarged. There is pulmonary vascular congestion without overt pulmonary edema. Patient is status post CABG. IMPRESSION: No active cardiopulmonary disease. Electronically Signed   By: Misty Stanley M.D.   On: 10/28/2016 18:34   Ct Head Wo Contrast  Result Date: 10/28/2016 CLINICAL DATA:  Altered mental status possible fall EXAM: CT HEAD WITHOUT CONTRAST TECHNIQUE: Contiguous axial images were obtained from the base of the skull through the vertex without intravenous contrast. COMPARISON:  MRI 02/21/2016, CT 08/07/2015 FINDINGS: Brain: No acute territorial infarction, intracranial hemorrhage or extra-axial fluid collection is seen. No focal mass, mass effect or  midline shift. Mild to moderate periventricular white matter hypodensity consistent with small vessel disease. Old left thalamic lacunar infarcts as before. Old left cerebellar infarct as before. Ventricles are similar in size compared to the prior study. There is mild atrophy. Vascular: No hyperdense vessels.  Carotid artery calcifications. Skull: Mastoid air cells clear.  No skull fracture Sinuses/Orbits: Mucosal thickening in the maxillary ethmoid and sphenoid sinuses. No acute orbital abnormality. Bilateral lens extraction. Other: None IMPRESSION: No definite CT evidence for acute intracranial abnormality Electronically Signed   By: Donavan Foil M.D.   On: 10/28/2016 18:49   Ct Angio Neck W Or Wo Contrast  Addendum Date: 10/29/2016   ADDENDUM REPORT: 10/29/2016 02:03 ADDENDUM: These results were called by telephone at the time of interpretation on 10/29/2016 at 2:02 am to Dr. Loleta Books, who verbally acknowledged these results. Electronically Signed   By: Kristine Garbe M.D.   On: 10/29/2016 02:03   Result Date: 10/29/2016 CLINICAL DATA:  83 y/o  M; stroke patient with follow-up scan. EXAM: CT ANGIOGRAPHY HEAD AND NECK TECHNIQUE: Multidetector CT imaging of the head and neck was performed using the standard protocol during bolus administration of intravenous contrast. Multiplanar CT image reconstructions and MIPs were obtained to evaluate the vascular anatomy. Carotid stenosis measurements (when applicable) are obtained utilizing NASCET criteria, using the distal internal carotid diameter as the denominator. CONTRAST:  50 cc Isovue 370 COMPARISON:  10/28/2016 MRI of the head FINDINGS: CTA NECK FINDINGS Aortic arch: Severe aortic atherosclerosis with calcification and shaggy fibrofatty plaque. Bovine aortic arch, normal variant. Common origin of left vertebral artery and left subclavian artery. No significant stenosis of great vessel origins. Right carotid system: No evidence of dissection, stenosis  (50% or greater) or occlusion. Mild mixed plaque of the bifurcation. Left carotid system: No evidence of dissection, stenosis (50% or greater) or occlusion. Predominantly fibrofatty plaque of the bifurcation with mild 40% stenosis of proximal ICA. Vertebral arteries: Right vertebral origin mixed plaque with severe stenosis (series 8, image 140). No evidence of dissection or occlusion. Skeleton: Moderate cervical spondylosis with discogenic and facet degenerative changes greatest at the C5 through C7 levels. No high-grade bony canal stenosis or foraminal narrowing. Other neck: Lipoma centered in the right anterior neck at the level of larynx anterior to sternocleidomastoid muscle measuring 40 x 21 x 42 mm (AP x  ML x CC) series 5, image 70 and series 8, image 100. Subcentimeter calcified thyroid nodules. Upper chest: Negative. Review of the MIP images confirms the above findings CTA HEAD FINDINGS Anterior circulation: Calcified plaque of bilateral cavernous and paraclinoid internal carotid artery is without high-grade stenosis. Irregularity of bilateral M1 segments with distal right M1 mild stenosis and no significant left-sided stenosis. Several short segments of mild stenosis of bilateral ACA and MCA. Posterior circulation: Irregularity of right V4 segment with mild stenosis. Calcified plaque of left V4 segment without significant stenosis. Mild narrowing of the upper basilar artery. Severe stenosis of proximal right AICA. Short segment of moderate stenosis of left P1 segment. Proximal right P1 occlusion (series 7, image 101 and series 11, image 26). Venous sinuses: As permitted by contrast timing, patent. Anatomic variants: Small anterior communicating artery. No posterior communicating artery identified, likely hypoplastic or absent. Delayed phase: No abnormal intracranial enhancement. Review of the MIP images confirms the above findings IMPRESSION: 1. Right posterior cerebral artery P1 occlusion. 2. Otherwise no  evidence for large vessel occlusion, aneurysm, or vascular malformation of circle of Willis. 3. Extensive intracranial atherosclerosis with multiple segments of stenosis in the anterior and posterior circulation. 4. Severe aortic atherosclerosis with calcified and shaggy fibrofatty plaque. 5. Mild 40% stenosis of proximal left ICA. 6. 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 7. Otherwise carotid and vertebral arteries of the neck are patent without evidence of occlusion, aneurysm, dissection, or high-grade stenosis. 8. 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 Electronically Signed: By: Kristine Garbe M.D. On: 10/29/2016 01:53   Mr Brain Wo Contrast  Result Date: 10/28/2016 CLINICAL DATA:  [place] while shoveling snow. Confusion, change in speech. History of hypertension, hyperlipidemia. EXAM: MRI HEAD WITHOUT CONTRAST TECHNIQUE: Multiplanar, multiecho pulse sequences of the brain and surrounding structures were obtained without intravenous contrast. COMPARISON:  CT HEAD October 28, 2016 and MRI head Feb 21, 2016 FINDINGS: BRAIN: 15 x 11 mm focus of reduced diffusion mesial LEFT thalamus with low ADC values. 3 mm T2 hyperintensity LEFT thalamus most consistent with perivascular space, present on prior MRI. No susceptibility artifact to suggest hemorrhage. Ventricles and sulci are normal for patient's age. Old small LEFT cerebellar infarct. Patchy supratentorial white matter FLAIR T2 hyperintensities. No abnormal extra-axial fluid collections. VASCULAR: Similar probable slow flow RIGHT vertebral artery, major intracranial vascular flow voids otherwise maintained. SKULL AND UPPER CERVICAL SPINE: No abnormal sellar expansion. No suspicious calvarial bone marrow signal. Craniocervical junction maintained. SINUSES/ORBITS: Trace paranasal sinus mucosal thickening. Mastoid air cells are well aerated. Status post bilateral ocular lens implants. The included ocular globes and orbital contents are  non-suspicious. OTHER: None. IMPRESSION: Acute 15 x 11 mm LEFT thalamus infarct. Otherwise stable appearance of the brain including old small LEFT cerebellar infarct and mild to moderate chronic small vessel ischemic disease. Electronically Signed   By: Elon Alas M.D.   On: 10/28/2016 21:28    Assessment/Plan: Diagnosis: left thalamic infarct with right hemisensory deficits, ongoing cognitive deficits 1. Does the need for close, 24 hr/day medical supervision in concert with the patient's rehab needs make it unreasonable for this patient to be served in a less intensive setting? Yes 2. Co-Morbidities requiring supervision/potential complications: diabetes which requires regulation, coronary artery disease with labile blood pressures, Afib requiring monitoring during physical activity 3. Due to bladder management, bowel management, safety, skin/wound care, disease management, medication administration, pain management and patient education, does the patient require 24 hr/day rehab nursing? Yes 4. Does the  patient require coordinated care of a physician, rehab nurse, PT (1-2 hrs/day, 5 days/week), OT (1-2 hrs/day, 5 days/week) and SLP (1-2 hrs/day, 5 days/week) to address physical and functional deficits in the context of the above medical diagnosis(es)? Yes Addressing deficits in the following areas: balance, endurance, locomotion, strength, transferring, bowel/bladder control, bathing, dressing, feeding, grooming, toileting, cognition and psychosocial support 5. Can the patient actively participate in an intensive therapy program of at least 3 hrs of therapy per day at least 5 days per week? Yes 6. The potential for patient to make measurable gains while on inpatient rehab is excellent 7. Anticipated functional outcomes upon discharge from inpatient rehab are supervision  with PT, supervision with OT, supervision with SLP. 8. Estimated rehab length of stay to reach the above functional goals is:  7-10 days 9. Does the patient have adequate social supports and living environment to accommodate these discharge functional goals? Yes 10. Anticipated D/C setting: Home 11. Anticipated post D/C treatments: HH therapy and Outpatient therapy 12. Overall Rehab/Functional Prognosis: excellent  RECOMMENDATIONS: This patient's condition is appropriate for continued rehabilitative care in the following setting: CIR Patient has agreed to participate in recommended program. N/A Note that insurance prior authorization may be required for reimbursement for recommended care.  Comment: Spoke with son at length. Pt still with significant balance and cognitive deficits. Wife at home can provide supervision only. Patient himself was independent prior to admit.   Rehab Admissions Coordinator to follow up.  Thanks,  [name], MD, Mellody Drown    Cathlyn Parsons., PA-C 10/29/2016

## 2016-10-29 NOTE — Progress Notes (Signed)
Initial Nutrition Assessment  DOCUMENTATION CODES:   Not applicable  INTERVENTION:   -Ensure Enlive po BID, each supplement provides 350 kcal and 20 grams of protein  NUTRITION DIAGNOSIS:   Predicted suboptimal nutrient intake related to acute illness (unintentional wt loss) as evidenced by percent weight loss.  GOAL:   Patient will meet greater than or equal to 90% of their needs  MONITOR:   PO intake, Supplement acceptance, Labs, Weight trends, Skin, I & O's  REASON FOR ASSESSMENT:   Malnutrition Screening Tool    ASSESSMENT:   Jonathan [name] is a 83 y.o. male with a past medical history significant for CAD s/p CABG in 2007, HTN, pAF no longer on Xarelto and diet controlled DM who presents with altered mental status.  Pt admitted with possible TIA vs syncope. MRI revealed lt thalamus infarct.   Pt out of room at time of visit. No family or caregivers present to provide additional hx.   Per RN, pt passed swallow eval this morning.   Per chart review, pt was very active and independent PTA, was actually shoveling snow PTA.  Per malnutrition screening tool, pt unsure of weight loss PTA. Noted UBW is 175#. Per wt records, pt has experienced an 11# (6.2%) wt loss over the past 7 months.   No PO documentation yet available. RD will add supplement to ensure optimization of nutritional status.   Therapy notes are recommending CIR at this time.   Labs reviewed.   Diet Order:  Diet Heart Room service appropriate? Yes; Fluid consistency: Thin  Skin:  Reviewed, no issues  Last BM:  10/28/16  Height:   Ht Readings from Last 1 Encounters:  10/28/16 5\' 11"  (1.803 m)    Weight:   Wt Readings from Last 1 Encounters:  10/28/16 164 lb 14.4 oz (74.8 kg)    Ideal Body Weight:  78.2 kg  BMI:  Body mass index is 23 kg/m.  Estimated Nutritional Needs:   Kcal:  [id number]  Protein:  85-100 grams  Fluid:  1.7-1.9 L  EDUCATION NEEDS:   Education needs  addressed  Deondria Puryear A. Jimmye Norman, RD, LDN, CDE Pager: [phone] After hours Pager: [phone]

## 2016-10-29 NOTE — Progress Notes (Signed)
Inpatient Rehabilitation  Per OT request patient was screened by Gunnar Fusi for appropriateness for an Inpatient Acute Rehab consult.  At this time we are recommending an Inpatient Rehab consult.  MD paged to notify; please order if you are agreeable.    Carmelia Roller., CCC/SLP Admission Coordinator  [place]  Cell [phone]

## 2016-10-29 NOTE — Evaluation (Signed)
Physical Therapy Evaluation Patient Details Name: Jonathan [name] MRN: [id number] DOB: [birth year] Today's Date: 10/29/2016   History of Present Illness  83 y.o.malewith a past medical history significant for CAD s/p CABG in 2007, HTN, pAF no longer on Xarelto and diet controlled DMwho presents with altered mental status and right sided numbness. MRI on 1/17 + for acute left thalamus infarct.  Clinical Impression  Pt presenting with decreased functional mobility and safety awareness. Pt able to ambulate with rw 110 ft but needing assistance for safety repeatedly. Pt unable to recall living situation and demonstrates poor insight into his current deficits. The patient's son was present throughout the session and remained supportive throughout. Recommending CIR for further rehabilitation following acute stay.     Follow Up Recommendations CIR    Equipment Recommendations   (to be determined)    Recommendations for Other Services Rehab consult     Precautions / Restrictions Precautions Precautions: Fall Restrictions Weight Bearing Restrictions: No      Mobility  Bed Mobility Overal bed mobility: Needs Assistance Bed Mobility: Sit to Supine     Supine to sit: Min guard Sit to supine: Min assist   General bed mobility comments: Pt using rail for assist  Transfers Overall transfer level: Needs assistance Equipment used: Rolling walker (2 wheeled);None Transfers: Sit to/from Stand Sit to Stand: Min assist         General transfer comment: repeated cues for safety needed, pt not consistenlty following commands  Ambulation/Gait Ambulation/Gait assistance: Min assist Ambulation Distance (Feet): 110 Feet Assistive device: Rolling walker (2 wheeled) Gait Pattern/deviations: Step-through pattern;Decreased step length - right;Decreased step length - left;Trunk flexed Gait velocity: decreased   General Gait Details: Pt having difficulty with Rt UE grip of rw, poor safety  awareness using rw, assist needed to avoid objects in room.   Stairs            Wheelchair Mobility    Modified Rankin (Stroke Patients Only) Modified Rankin (Stroke Patients Only) Pre-Morbid Rankin Score: No symptoms Modified Rankin: Moderately severe disability     Balance Overall balance assessment: Needs assistance Sitting-balance support: Feet supported;No upper extremity supported Sitting balance-Leahy Scale: Fair     Standing balance support: No upper extremity supported Standing balance-Leahy Scale: Poor Standing balance comment: physical assist needed for standing balance                             Pertinent Vitals/Pain Pain Assessment: No/denies pain    Home Living Family/patient expects to be discharged to:: Private residence Living Arrangements: Spouse/significant other (pt unable to recall, detail provided by son) Available Help at Discharge: Family;Available 24 hours/day Type of Home: House Home Access: Level entry     Home Layout: One level Home Equipment: Shower seat Additional Comments: Pt unable to recall living situation, details provided by son.     Prior Function Level of Independence: Independent         Comments: enjoys Dealer work     Journalist, newspaper   Dominant Hand: Right    Extremity/Trunk Assessment   Upper Extremity Assessment Upper Extremity Assessment: Defer to OT evaluation RUE Deficits / Details: Full ROM. Decreased grip strength and coordination. RUE Coordination: decreased fine motor;decreased gross motor    Lower Extremity Assessment Lower Extremity Assessment: RLE deficits/detail RLE Deficits / Details: grossly decreased strength    Cervical / Trunk Assessment Cervical / Trunk Assessment: Kyphotic  Communication   Communication: Expressive  difficulties  Cognition Arousal/Alertness: Awake/alert Behavior During Therapy: Flat affect Overall Cognitive Status: Impaired/Different from  baseline Area of Impairment: Orientation;Following commands;Safety/judgement;Awareness Orientation Level: Disoriented to;Person;Time;Situation   Memory: Decreased short-term memory Following Commands: Follows one step commands inconsistently Safety/Judgement: Decreased awareness of safety;Decreased awareness of deficits Awareness: Intellectual Problem Solving: Slow processing;Requires verbal cues;Requires tactile cues General Comments: Pt unable to identify "cactus" "hammock" on NIH picture sheet. Difficulty with DOB. Cues for sequencing and initiation.    General Comments      Exercises     Assessment/Plan    PT Assessment Patient needs continued PT services  PT Problem List Decreased strength;Decreased range of motion;Decreased activity tolerance;Decreased mobility;Decreased balance;Decreased safety awareness;Decreased coordination          PT Treatment Interventions DME instruction;Gait training;Stair training;Functional mobility training;Therapeutic activities;Therapeutic exercise;Balance training;Neuromuscular re-education;Cognitive remediation;Patient/family education    PT Goals (Current goals can be found in the Care Plan section)  Acute Rehab PT Goals Patient Stated Goal: get back to being independent PT Goal Formulation: With patient/family Time For Goal Achievement: 11/12/16 Potential to Achieve Goals: Good    Frequency Min 4X/week   Barriers to discharge        Co-evaluation               End of Session Equipment Utilized During Treatment: Gait belt Activity Tolerance: Patient tolerated treatment well Patient left: in bed (pt in bed with transport) Nurse Communication: Mobility status    Functional Assessment Tool Used: clinical judgment Functional Limitation: Mobility: Walking and moving around Mobility: Walking and Moving Around Current Status [phone]): At least 40 percent but less than 60 percent impaired, limited or restricted Mobility: Walking  and Moving Around Goal Status [phone]): At least 20 percent but less than 40 percent impaired, limited or restricted    Time: [id number] PT Time Calculation (min) (ACUTE ONLY): 25 min   Charges:   PT Evaluation $PT Eval Moderate Complexity: 1 Procedure PT Treatments $Gait Training: 8-22 mins   PT G Codes:   PT G-Codes **NOT FOR INPATIENT CLASS** Functional Assessment Tool Used: clinical judgment Functional Limitation: Mobility: Walking and moving around Mobility: Walking and Moving Around Current Status [id number]): At least 40 percent but less than 60 percent impaired, limited or restricted Mobility: Walking and Moving Around Goal Status [phone]): At least 20 percent but less than 40 percent impaired, limited or restricted    Cassell Clement, PT, [place] Pager [phone] Office [phone]  10/29/2016, 12:52 PM

## 2016-10-29 NOTE — Care Management Note (Signed)
Case Management Note  Patient Details  Name: Jonathan [name] MRN: [id number] Date of Birth: Dec 25, 1928  Subjective/Objective:                 Patient from home shoveling snow exhibiting s/s stroke. +CVA, LEFT thalamus infarct per MRI. OT rec CIR, PT note pending. Current anticoag ASA.  PCP Dr Laurann Montana   Action/Plan:  CM will continue to follow.   Expected Discharge Date:                  Expected Discharge Plan:     In-House Referral:     Discharge planning Services  CM Consult  Post Acute Care Choice:    Choice offered to:     DME Arranged:    DME Agency:     HH Arranged:    HH Agency:     Status of Service:  In process, will continue to follow  If discussed at Long Length of Stay Meetings, dates discussed:    Additional Comments:  Carles Collet, RN 10/29/2016, 11:36 AM

## 2016-10-29 NOTE — Progress Notes (Signed)
ANTICOAGULATION CONSULT NOTE - Initial Consult  Pharmacy Consult for Eliquis Indication: atrial fibrillation  Allergies  Allergen Reactions  . Amlodipine Other (See Comments)    Fatigue   . Codeine     unknown  . Metformin And Related Other (See Comments)    fatigue  . Statins     "pass out, dizzy, drunk feeling, poor balance"  . Welchol [Colesevelam Hcl]     Patient Measurements: Height: 5\' 11"  (180.3 cm) Weight: 164 lb 14.4 oz (74.8 kg) IBW/kg (Calculated) : 75.3   Vital Signs: Temp: 98.2 F (36.8 C) (01/18 0843) Temp Source: Oral (01/18 0843) BP: 155/71 (01/18 0843) Pulse Rate: 76 (01/18 0843)  Labs:  Recent Labs  10/28/16 1754  HGB 12.9*  HCT 38.5*  PLT 241  CREATININE 0.71    Estimated Creatinine Clearance: 71.4 mL/min (by C-G formula based on SCr of 0.71 mg/dL).   Medical History: Past Medical History:  Diagnosis Date  . Allergic rhinitis   . BPH (benign prostatic hypertrophy)   . Coronary atherosclerosis of native coronary artery   . Degeneration of lumbar or lumbosacral intervertebral disc   . Diabetes ([place])    no meds  . Dyslipidemia   . HOH (hard of hearing)   . Hyperlipidemia   . Hypertension   . Right leg weakness   . S/P CABG x 4 2006   LIMA-LAD, SVG-rPDA, sSVG-OM1-OM2  . Stroke ([place])   . Wears glasses    reading     Assessment: 83 year old male beginning Eliquis for Afib Scr < 1.5, Weight > 60kg, age > 20 -- 1 of 3  Goal of Therapy:   Monitor platelets by anticoagulation protocol: Yes   Plan:  Eliquis 5 mg po BID Follow up CBC  Thank you Anette Guarneri, PharmD [phone]  10/29/2016,3:27 PM

## 2016-10-29 NOTE — Progress Notes (Signed)
Pt admitted from [facility], arrived on stretcher, pt son is at bedside, viatls chart in flow sheet, no distress noted. Safety precaution inititated

## 2016-10-29 NOTE — Progress Notes (Addendum)
PROGRESS NOTE                                                                                                                                                                                                             Patient Demographics:    Jonathan [name], is a 83 y.o. male, DOB - April 22, 1929, [id number]  Admit date - 10/28/2016   Admitting Physician Edwin Dada, MD  Outpatient Primary MD for the patient is Irven Shelling, MD  LOS - 0  Outpatient Specialists:  Cardiologist (Dr. Wynonia Lawman)  Chief Complaint  Patient presents with  . Altered Mental Status       Brief Narrative   83 year old male with history of CAD status post CABG in 2007, hypertension, paroxysmal A. fib (Xarelto after discussing with cardiologist one month back since patient had vague symptoms including GI upset and weakness), diet-controlled diabetes mellitus presented with slurred speech, confusion and right-sided weakness. Patient found to have acute left thalamic infarct.   Subjective:    Patient complains of some weakness in his right extremity, appears confused with word finding difficulty. He was not happy with the idea of going to rehabilitation.   Assessment  & Plan :   Principal problem Acute left thalamus infarct ([place]) These factors include hypertension, CAD, hyperlipidemia and paroxysmal A. fib. Follow 2-D echo. CT angiogram without significant carotid stenosis. Stroke team following. PT recommends CIR. EEG negative for seizure activity. CHADS2 vasc score of 6 remain at 13.6% per year risk of stroke/TIA/time embolism. Discussed with patient and son at bedside who agree with trying different anticoagulant. Will start him on Eliquis  and see for any obvious side effects (seen possibly with Xarelto). If patient unable to tolerate eliquis, switch to aspirin and Plavix. Discussed panic stroke team who agree. LDL 126, Cannot  place him on statin due to allergy. A1c pending. Allow permissive hypertension.  CAD with history of CABG On full dose aspirin. If patient started on anticoagulation will reduce to baby aspirin. There are low 126. Will start on Lipitor. Patient had cardiac cath pain 09/2015 which showed severe three-vessel coronary artery disease and cardiology recommended optimal medical management.  Essential hypertension Allowing permissive blood pressure.   Paroxysmal A. fib This was diagnosed during his hospitalization in 09/2015. He was started on Xarelto at that time but since patient complained to  his cardiologist of vague GI discomfort and weakness and wishes not to continue anticoagulation he was taken off Xarelto about 3-4 weeks back. Given his higher CHADS2vasc with acute stroke, discussed and evaluation options with patient's son who agrees with starting him on  Eliquis.  Diabetes mellitus type 2 Currently diet controlled. Check A1c and monitor on SSI.  Hyperlipidemia   LDL of 126. Cannot add statin due to listed allergy.  Code Status : Full code  Family Communication  : Son at bedside  Disposition Plan  : PT recommends CIR. Possible discharge on 1/19  Barriers For Discharge : Improving symptoms  Consults  :   Stroke  Procedures  :  CT head MRI brain CT angiogram head and neck 2-D echo (pending)  DVT Prophylaxis  :  Systemic anticoagulation  Lab Results  Component Value Date   PLT 241 10/28/2016    Antibiotics  :    Anti-infectives    None        Objective:   Vitals:   10/29/16 0221 10/29/16 0409 10/29/16 0615 10/29/16 0843  BP: 137/82 (!) 159/64 (!) 141/79 (!) 155/71  Pulse:  71 79 76  Resp:    20  Temp:  97.7 F (36.5 C) 97.7 F (36.5 C) 98.2 F (36.8 C)  TempSrc:   Oral Oral  SpO2:  96% 95% 95%  Weight:      Height:        Wt Readings from Last 3 Encounters:  10/28/16 74.8 kg (164 lb 14.4 oz)  02/13/16 79.4 kg (175 lb)  10/01/15 79.2 kg (174 lb  9.7 oz)     Intake/Output Summary (Last 24 hours) at 10/29/16 1358 Last data filed at 10/29/16 1246  Gross per 24 hour  Intake              514 ml  Output              800 ml  Net             -286 ml     Physical Exam  Gen: not in distress, Appears confused with severe word finding difficulty  HEENT: moist mucosa, supple neck Chest: clear b/l, no added sounds CVS: N S1&S2, no murmurs,  GI: soft, NT, ND,  Musculoskeletal: warm, no edema [id number] and oriented 1-2, right-sided weakness (about 4/5 in both right upper and lower extremity)    Data Review:    CBC  Recent Labs Lab 10/28/16 1754  WBC 8.6  HGB 12.9*  HCT 38.5*  PLT 241  MCV 79.2  MCH 26.5  MCHC 33.5  RDW 14.0  LYMPHSABS 1.3  MONOABS 0.5  EOSABS 0.2  BASOSABS 0.0    Chemistries   Recent Labs Lab 10/28/16 1754  NA 139  K 3.9  CL 104  CO2 27  GLUCOSE 133*  BUN 15  CREATININE 0.71  CALCIUM 9.2   ------------------------------------------------------------------------------------------------------------------  Recent Labs  10/29/16 0649  CHOL 183  HDL 25*  LDLCALC 126*  TRIG 161*  CHOLHDL 7.3    Lab Results  Component Value Date   HGBA1C 8.1 (H) 09/28/2015   ------------------------------------------------------------------------------------------------------------------ No results for input(s): TSH, T4TOTAL, T3FREE, THYROIDAB in the last 72 hours.  Invalid input(s): FREET3 ------------------------------------------------------------------------------------------------------------------ No results for input(s): VITAMINB12, FOLATE, FERRITIN, TIBC, IRON, RETICCTPCT in the last 72 hours.  Coagulation profile No results for input(s): INR, PROTIME in the last 168 hours.  No results for input(s): DDIMER in the last 72 hours.  Cardiac Enzymes No  results for input(s): CKMB, TROPONINI, MYOGLOBIN in the last 168 hours.  Invalid input(s):  CK ------------------------------------------------------------------------------------------------------------------    Component Value Date/Time   BNP 307.3 (H) 09/28/2015 1832    Inpatient Medications  Scheduled Meds: . aspirin  300 mg Rectal Daily   Or  . aspirin  325 mg Oral Daily  . enoxaparin (LOVENOX) injection  40 mg Subcutaneous Q24H  . feeding supplement (ENSURE ENLIVE)  237 mL Oral BID BM  . isosorbide mononitrate  15 mg Oral Daily   Continuous Infusions: PRN Meds:.acetaminophen **OR** acetaminophen (TYLENOL) oral liquid 160 mg/5 mL **OR** acetaminophen  Micro Results No results found for this or any previous visit (from the past 240 hour(s)).  Radiology Reports Ct Angio Head W Or Wo Contrast  Addendum Date: 10/29/2016   ADDENDUM REPORT: 10/29/2016 02:03 ADDENDUM: These results were called by telephone at the time of interpretation on 10/29/2016 at 2:02 am to Dr. Loleta Books, who verbally acknowledged these results. Electronically Signed   By: Kristine Garbe M.D.   On: 10/29/2016 02:03   Result Date: 10/29/2016 CLINICAL DATA:  83 y/o  M; stroke patient with follow-up scan. EXAM: CT ANGIOGRAPHY HEAD AND NECK TECHNIQUE: Multidetector CT imaging of the head and neck was performed using the standard protocol during bolus administration of intravenous contrast. Multiplanar CT image reconstructions and MIPs were obtained to evaluate the vascular anatomy. Carotid stenosis measurements (when applicable) are obtained utilizing NASCET criteria, using the distal internal carotid diameter as the denominator. CONTRAST:  50 cc Isovue 370 COMPARISON:  10/28/2016 MRI of the head FINDINGS: CTA NECK FINDINGS Aortic arch: Severe aortic atherosclerosis with calcification and shaggy fibrofatty plaque. Bovine aortic arch, normal variant. Common origin of left vertebral artery and left subclavian artery. No significant stenosis of great vessel origins. Right carotid system: No evidence of  dissection, stenosis (50% or greater) or occlusion. Mild mixed plaque of the bifurcation. Left carotid system: No evidence of dissection, stenosis (50% or greater) or occlusion. Predominantly fibrofatty plaque of the bifurcation with mild 40% stenosis of proximal ICA. Vertebral arteries: Right vertebral origin mixed plaque with severe stenosis (series 8, image 140). No evidence of dissection or occlusion. Skeleton: Moderate cervical spondylosis with discogenic and facet degenerative changes greatest at the C5 through C7 levels. No high-grade bony canal stenosis or foraminal narrowing. Other neck: Lipoma centered in the right anterior neck at the level of larynx anterior to sternocleidomastoid muscle measuring 40 x 21 x 42 mm (AP x ML x CC) series 5, image 70 and series 8, image 100. Subcentimeter calcified thyroid nodules. Upper chest: Negative. Review of the MIP images confirms the above findings CTA HEAD FINDINGS Anterior circulation: Calcified plaque of bilateral cavernous and paraclinoid internal carotid artery is without high-grade stenosis. Irregularity of bilateral M1 segments with distal right M1 mild stenosis and no significant left-sided stenosis. Several short segments of mild stenosis of bilateral ACA and MCA. Posterior circulation: Irregularity of right V4 segment with mild stenosis. Calcified plaque of left V4 segment without significant stenosis. Mild narrowing of the upper basilar artery. Severe stenosis of proximal right AICA. Short segment of moderate stenosis of left P1 segment. Proximal right P1 occlusion (series 7, image 101 and series 11, image 26). Venous sinuses: As permitted by contrast timing, patent. Anatomic variants: Small anterior communicating artery. No posterior communicating artery identified, likely hypoplastic or absent. Delayed phase: No abnormal intracranial enhancement. Review of the MIP images confirms the above findings IMPRESSION: 1. Right posterior cerebral artery P1  occlusion. 2. Otherwise  no evidence for large vessel occlusion, aneurysm, or vascular malformation of circle of Willis. 3. Extensive intracranial atherosclerosis with multiple segments of stenosis in the anterior and posterior circulation. 4. Severe aortic atherosclerosis with calcified and shaggy fibrofatty plaque. 5. Mild 40% stenosis of proximal left ICA. 6. 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 7. Otherwise carotid and vertebral arteries of the neck are patent without evidence of occlusion, aneurysm, dissection, or high-grade stenosis. 8. 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 Electronically Signed: By: Kristine Garbe M.D. On: 10/29/2016 01:53   Dg Chest 2 View  Result Date: 10/28/2016 CLINICAL DATA:  Altered mental status EXAM: CHEST  2 VIEW COMPARISON:  10/02/2015 FINDINGS: AP and lateral views of the chest show low volumes. The cardio pericardial silhouette is enlarged. There is pulmonary vascular congestion without overt pulmonary edema. Patient is status post CABG. IMPRESSION: No active cardiopulmonary disease. Electronically Signed   By: Misty Stanley M.D.   On: 10/28/2016 18:34   Ct Head Wo Contrast  Result Date: 10/28/2016 CLINICAL DATA:  Altered mental status possible fall EXAM: CT HEAD WITHOUT CONTRAST TECHNIQUE: Contiguous axial images were obtained from the base of the skull through the vertex without intravenous contrast. COMPARISON:  MRI 02/21/2016, CT 08/07/2015 FINDINGS: Brain: No acute territorial infarction, intracranial hemorrhage or extra-axial fluid collection is seen. No focal mass, mass effect or midline shift. Mild to moderate periventricular white matter hypodensity consistent with small vessel disease. Old left thalamic lacunar infarcts as before. Old left cerebellar infarct as before. Ventricles are similar in size compared to the prior study. There is mild atrophy. Vascular: No hyperdense vessels.  Carotid artery calcifications. Skull: Mastoid air  cells clear.  No skull fracture Sinuses/Orbits: Mucosal thickening in the maxillary ethmoid and sphenoid sinuses. No acute orbital abnormality. Bilateral lens extraction. Other: None IMPRESSION: No definite CT evidence for acute intracranial abnormality Electronically Signed   By: Donavan Foil M.D.   On: 10/28/2016 18:49   Ct Angio Neck W Or Wo Contrast  Addendum Date: 10/29/2016   ADDENDUM REPORT: 10/29/2016 02:03 ADDENDUM: These results were called by telephone at the time of interpretation on 10/29/2016 at 2:02 am to Dr. Loleta Books, who verbally acknowledged these results. Electronically Signed   By: Kristine Garbe M.D.   On: 10/29/2016 02:03   Result Date: 10/29/2016 CLINICAL DATA:  83 y/o  M; stroke patient with follow-up scan. EXAM: CT ANGIOGRAPHY HEAD AND NECK TECHNIQUE: Multidetector CT imaging of the head and neck was performed using the standard protocol during bolus administration of intravenous contrast. Multiplanar CT image reconstructions and MIPs were obtained to evaluate the vascular anatomy. Carotid stenosis measurements (when applicable) are obtained utilizing NASCET criteria, using the distal internal carotid diameter as the denominator. CONTRAST:  50 cc Isovue 370 COMPARISON:  10/28/2016 MRI of the head FINDINGS: CTA NECK FINDINGS Aortic arch: Severe aortic atherosclerosis with calcification and shaggy fibrofatty plaque. Bovine aortic arch, normal variant. Common origin of left vertebral artery and left subclavian artery. No significant stenosis of great vessel origins. Right carotid system: No evidence of dissection, stenosis (50% or greater) or occlusion. Mild mixed plaque of the bifurcation. Left carotid system: No evidence of dissection, stenosis (50% or greater) or occlusion. Predominantly fibrofatty plaque of the bifurcation with mild 40% stenosis of proximal ICA. Vertebral arteries: Right vertebral origin mixed plaque with severe stenosis (series 8, image 140). No evidence of  dissection or occlusion. Skeleton: Moderate cervical spondylosis with discogenic and facet degenerative changes greatest at the C5  through C7 levels. No high-grade bony canal stenosis or foraminal narrowing. Other neck: Lipoma centered in the right anterior neck at the level of larynx anterior to sternocleidomastoid muscle measuring 40 x 21 x 42 mm (AP x ML x CC) series 5, image 70 and series 8, image 100. Subcentimeter calcified thyroid nodules. Upper chest: Negative. Review of the MIP images confirms the above findings CTA HEAD FINDINGS Anterior circulation: Calcified plaque of bilateral cavernous and paraclinoid internal carotid artery is without high-grade stenosis. Irregularity of bilateral M1 segments with distal right M1 mild stenosis and no significant left-sided stenosis. Several short segments of mild stenosis of bilateral ACA and MCA. Posterior circulation: Irregularity of right V4 segment with mild stenosis. Calcified plaque of left V4 segment without significant stenosis. Mild narrowing of the upper basilar artery. Severe stenosis of proximal right AICA. Short segment of moderate stenosis of left P1 segment. Proximal right P1 occlusion (series 7, image 101 and series 11, image 26). Venous sinuses: As permitted by contrast timing, patent. Anatomic variants: Small anterior communicating artery. No posterior communicating artery identified, likely hypoplastic or absent. Delayed phase: No abnormal intracranial enhancement. Review of the MIP images confirms the above findings IMPRESSION: 1. Right posterior cerebral artery P1 occlusion. 2. Otherwise no evidence for large vessel occlusion, aneurysm, or vascular malformation of circle of Willis. 3. Extensive intracranial atherosclerosis with multiple segments of stenosis in the anterior and posterior circulation. 4. Severe aortic atherosclerosis with calcified and shaggy fibrofatty plaque. 5. Mild 40% stenosis of proximal left ICA. 6. Severe stenosis of right  vertebral artery origin. 7. Otherwise carotid and vertebral arteries of the neck are patent without evidence of occlusion, aneurysm, dissection, or high-grade stenosis. 8. 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 Electronically Signed: By: Kristine Garbe M.D. On: 10/29/2016 01:53   Mr Brain Wo Contrast  Result Date: 10/28/2016 CLINICAL DATA:  [place] while shoveling snow. Confusion, change in speech. History of hypertension, hyperlipidemia. EXAM: MRI HEAD WITHOUT CONTRAST TECHNIQUE: Multiplanar, multiecho pulse sequences of the brain and surrounding structures were obtained without intravenous contrast. COMPARISON:  CT HEAD October 28, 2016 and MRI head Feb 21, 2016 FINDINGS: BRAIN: 15 x 11 mm focus of reduced diffusion mesial LEFT thalamus with low ADC values. 3 mm T2 hyperintensity LEFT thalamus most consistent with perivascular space, present on prior MRI. No susceptibility artifact to suggest hemorrhage. Ventricles and sulci are normal for patient's age. Old small LEFT cerebellar infarct. Patchy supratentorial white matter FLAIR T2 hyperintensities. No abnormal extra-axial fluid collections. VASCULAR: Similar probable slow flow RIGHT vertebral artery, major intracranial vascular flow voids otherwise maintained. SKULL AND UPPER CERVICAL SPINE: No abnormal sellar expansion. No suspicious calvarial bone marrow signal. Craniocervical junction maintained. SINUSES/ORBITS: Trace paranasal sinus mucosal thickening. Mastoid air cells are well aerated. Status post bilateral ocular lens implants. The included ocular globes and orbital contents are non-suspicious. OTHER: None. IMPRESSION: Acute 15 x 11 mm LEFT thalamus infarct. Otherwise stable appearance of the brain including old small LEFT cerebellar infarct and mild to moderate chronic small vessel ischemic disease. Electronically Signed   By: Elon Alas M.D.   On: 10/28/2016 21:28    Time Spent in minutes  25   Louellen Molder M.D on  10/29/2016 at 1:58 PM  Between 7am to 7pm - Pager - [phone]  After 7pm go to [email or web address] - password [facility]  [facility] -  Office  [phone]

## 2016-10-29 NOTE — Evaluation (Signed)
Occupational Therapy Evaluation Patient Details Name: Jonathan [name] MRN: [id number] DOB: 1929/08/11 Today's Date: 10/29/2016    History of Present Illness 83 y.o.malewith a past medical history significant for CAD s/p CABG in 2007, HTN, pAF no longer on Xarelto and diet controlled DMwho presents with altered mental status and right sided numbness. MRI on 1/17 + for acute left thalamus infarct.   Clinical Impression   Pt reports he was independent with ADL and mobility PTA. Currently pt requires min assist overall for ADL and functional mobility with use of RW and cueing throughout for sequencing and safety. Pt presenting with impaired cognition, R sided weakness/impaired coordination, poor standing balance, and decreased safety awareness impacting his independence and safety with ADL and functional mobility. Recommending CIR level therapies to maximize independence and safety with ADL and functional mobility prior to return home. Pt would benefit from continued skilled OT to address established goals.    Follow Up Recommendations  CIR;Supervision/Assistance - 24 hour    Equipment Recommendations  Other (comment) (TBD)    Recommendations for Other Services Rehab consult;PT consult     Precautions / Restrictions Precautions Precautions: Fall Restrictions Weight Bearing Restrictions: No      Mobility Bed Mobility Overal bed mobility: Needs Assistance Bed Mobility: Supine to Sit;Sit to Supine     Supine to sit: Min guard Sit to supine: Min assist   General bed mobility comments: Assist for controlled descent of trunk to supine. HOB flat without use of bed rail.  Transfers Overall transfer level: Needs assistance Equipment used: Rolling walker (2 wheeled);None Transfers: Sit to/from Stand Sit to Stand: Min assist         General transfer comment: Min assist for steadying balance. Sit to stand without AD x1, more steady with use of RW.    Balance Overall balance  assessment: Needs assistance Sitting-balance support: Feet supported;No upper extremity supported Sitting balance-Leahy Scale: Fair     Standing balance support: No upper extremity supported Standing balance-Leahy Scale: Poor                              ADL Overall ADL's : Needs assistance/impaired Eating/Feeding: Set up;Sitting Eating/Feeding Details (indicate cue type and reason): Son reports pt having difficulty self feeding with use of R hand but able to complete with increased time. Grooming: Min guard;Sitting   Upper Body Bathing: Min guard;Sitting   Lower Body Bathing: Sit to/from stand   Upper Body Dressing : Min guard;Sitting   Lower Body Dressing: Sit to/from stand;Moderate assistance Lower Body Dressing Details (indicate cue type and reason): Min assist to don socks sitting EOB Toilet Transfer: Minimal assistance;Ambulation;BSC;RW;Cueing for safety;Cueing for sequencing Toilet Transfer Details (indicate cue type and reason): Simulated by sit to stand from EOB with short distance functional mobility in room.         Functional mobility during ADLs: Minimal assistance;Rolling walker;Cueing for safety;Cueing for sequencing General ADL Comments: Discussed post acute rehab with pt and son; they are agreeable.     Vision Additional Comments: Needs further assessment. Able to read large print font with occasional word confusion.   Perception     Praxis      Pertinent Vitals/Pain Pain Assessment: No/denies pain     Hand Dominance Right   Extremity/Trunk Assessment Upper Extremity Assessment Upper Extremity Assessment: RUE deficits/detail RUE Deficits / Details: Full ROM. Decreased grip strength and coordination. RUE Coordination: decreased fine motor;decreased gross motor  Lower Extremity Assessment Lower Extremity Assessment: Defer to PT evaluation   Cervical / Trunk Assessment Cervical / Trunk Assessment: Kyphotic   Communication  Communication Communication: Expressive difficulties   Cognition Arousal/Alertness: Awake/alert Behavior During Therapy: Flat affect Overall Cognitive Status: Impaired/Different from baseline Area of Impairment: Orientation;Memory;Following commands;Safety/judgement;Awareness;Problem solving Orientation Level: Disoriented to;Time;Situation (able to state year of DOB, unable to state month/day)   Memory: Decreased short-term memory Following Commands: Follows one step commands with increased time;Follows one step commands inconsistently Safety/Judgement: Decreased awareness of safety;Decreased awareness of deficits Awareness: Intellectual Problem Solving: Slow processing;Decreased initiation;Requires verbal cues General Comments: Pt unable to identify "cactus" "hammock" on NIH picture sheet. Difficulty with DOB. Cues for sequencing and initiation.   General Comments       Exercises       Shoulder Instructions      Home Living Family/patient expects to be discharged to:: Private residence Living Arrangements: Spouse/significant other (56 y.o. wife) Available Help at Discharge: Family;Available 24 hours/day Type of Home: House Home Access: Level entry     Home Layout: One level (one step from sun room into main level)     Bathroom Shower/Tub: Occupational psychologist: Handicapped height     Home Equipment: Shower seat          Prior Functioning/Environment Level of Independence: Independent        Comments: drives, does grocery shopping, independent with all ADL        OT Problem List: Decreased strength;Decreased activity tolerance;Impaired balance (sitting and/or standing);Decreased cognition;Decreased safety awareness;Decreased coordination;Decreased knowledge of use of DME or AE;Decreased knowledge of precautions;Impaired sensation;Impaired UE functional use   OT Treatment/Interventions: Self-care/ADL training;Neuromuscular education;Energy  conservation;DME and/or AE instruction;Therapeutic activities;Cognitive remediation/compensation;Patient/family education;Balance training    OT Goals(Current goals can be found in the care plan section) Acute Rehab OT Goals Patient Stated Goal: get back to being independent OT Goal Formulation: With patient/family Time For Goal Achievement: 11/12/16 Potential to Achieve Goals: Good ADL Goals Pt Will Perform Eating: with modified independence;sitting (with or without adaptive utensils) Pt Will Perform Grooming: with supervision;standing Pt Will Perform Upper Body Bathing: with set-up;sitting Pt Will Perform Lower Body Bathing: with supervision;sit to/from stand Pt Will Transfer to Toilet: with supervision;ambulating;bedside commode (over toilet) Pt Will Perform Toileting - Clothing Manipulation and hygiene: with supervision;sit to/from stand  OT Frequency: Min 2X/week   Barriers to D/C:            Co-evaluation              End of Session Equipment Utilized During Treatment: Gait belt;Rolling walker Nurse Communication: Mobility status  Activity Tolerance: Patient tolerated treatment well Patient left: in chair;with call bell/phone within reach;with chair alarm set;with family/visitor present   Time: [id number] OT Time Calculation (min): 29 min Charges:  OT General Charges $OT Visit: 1 Procedure OT Evaluation $OT Eval Moderate Complexity: 1 Procedure OT Treatments $Self Care/Home Management : 8-22 mins G-Codes: OT G-codes **NOT FOR INPATIENT CLASS** Functional Assessment Tool Used: Clinical judgement Functional Limitation: Self care Self Care Current Status [id number]): At least 20 percent but less than 40 percent impaired, limited or restricted Self Care Goal Status [id number]): At least 1 percent but less than 20 percent impaired, limited or restricted   Binnie Kand M.S., OTR/L Pager: [phone]  10/29/2016, 10:06 AM

## 2016-10-29 NOTE — Progress Notes (Signed)
  Echocardiogram 2D Echocardiogram has been performed.  Rosio Weiss L Androw 10/29/2016, 2:31 PM

## 2016-10-29 NOTE — Procedures (Signed)
INPATIENT EEG REPORT  Description This is a routine video-EEG done at the bedside with standard technique. The patient was listed as being awake and drowsy at times throughout the recording. No activating procedures were done. The patient was not listed as being on anti-seizure medication.  Interpretation Background activity shows 8-9 Hz alpha activity that is symmetric. The background was reduced in amplitude and moderately disorganized. There was some scattered noise from muscle/movement and rare eye-opening artifact. Activating procedures were not done. There was periodic generalized slowing seen consistent with drowsiness.  Impression This is an borderline EEG due to reduced amplitude and disorganized background.  Clinical Correlation These findings suggest the possibility of a mild encephalopathy though the findings are not clearly abnormal.  There is no evidence for seizure activity at this time.  Dr. Lawana Pai [facility] [phone]  10/29/2016, 6:54 PM

## 2016-10-29 NOTE — Progress Notes (Signed)
Routine EEG completed, results pending. 

## 2016-10-29 NOTE — Progress Notes (Addendum)
STROKE TEAM PROGRESS NOTE   HISTORY OF PRESENT ILLNESS (per record) Jonathan [name] is a 83 y.o. male he was last known to be well sometime between 11 and 12:30p 09/27/2017 when he spoke to his son. Following shoveling some snow, he came inside and took off his coat. He then was seen by his wife to be crawling, and had difficulty standing up. He went to lay down for a couple of hours. Following this, he continued to be confused and therefore his wife called his son over. At that time, he was complaining of right-sided numbness. His speech did not make sense completely and therefore he was brought to the hospital via EMS. Patient was not administered IV t-PA secondary to delay in arrival. He was admitted for further evaluation and treatment.   SUBJECTIVE (INTERVAL HISTORY) Son is at bedside. Pt lying in bed, no distress. Still has aphasia expressive > receptive. Has hx of afib on Xarelto but stopped last month as pt does not like the medication with some vague side effect. On ASA PTA. MRI showed left thalamic infarcts.    OBJECTIVE Temp:  [97.7 F (36.5 C)-98.3 F (36.8 C)] 98.2 F (36.8 C) (01/18 0843) Pulse Rate:  [70-82] 76 (01/18 0843) Cardiac Rhythm: Normal sinus rhythm (01/18 0700) Resp:  [12-21] 20 (01/18 0843) BP: (129-185)/(54-100) 155/71 (01/18 0843) SpO2:  [93 %-99 %] 95 % (01/18 0843) Weight:  [74.8 kg (164 lb 14.4 oz)] 74.8 kg (164 lb 14.4 oz) (01/17 2215)  CBC:  Recent Labs Lab 10/28/16 1754  WBC 8.6  NEUTROABS 6.6  HGB 12.9*  HCT 38.5*  MCV 79.2  PLT [id number]    Basic Metabolic Panel:  Recent Labs Lab 10/28/16 1754  NA 139  K 3.9  CL 104  CO2 27  GLUCOSE 133*  BUN 15  CREATININE 0.71  CALCIUM 9.2    Lipid Panel:    Component Value Date/Time   CHOL 183 10/29/2016 0649   TRIG 161 (H) 10/29/2016 0649   HDL 25 (L) 10/29/2016 0649   CHOLHDL 7.3 10/29/2016 0649   VLDL 32 10/29/2016 0649   LDLCALC 126 (H) 10/29/2016 0649   HgbA1c:  Lab Results  Component  Value Date   HGBA1C 8.1 (H) 09/28/2015   Urine Drug Screen: No results found for: LABOPIA, COCAINSCRNUR, LABBENZ, AMPHETMU, THCU, LABBARB    IMAGING I have personally reviewed the radiological images below and agree with the radiology interpretations.  Ct Head Wo Contrast 10/28/2016 No definite CT evidence for acute intracranial abnormality   Ct Angio Head W Or Wo Contrast Ct Angio Neck W Or Wo Contrast 10/29/2016 1. Right posterior cerebral artery P1 occlusion. 2. Otherwise no evidence for large vessel occlusion, aneurysm, or vascular malformation of circle of Willis. 3. Extensive intracranial atherosclerosis with multiple segments of stenosis in the anterior and posterior circulation. 4. Severe aortic atherosclerosis with calcified and shaggy fibrofatty plaque. 5. Mild 40% stenosis of proximal left ICA. 6. 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 7. Otherwise carotid and vertebral arteries of the neck are patent without evidence of occlusion, aneurysm, dissection, or high-grade stenosis. 8. 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   Mr Brain Wo Contrast 10/28/2016 Acute 15 x 11 mm LEFT thalamus infarct. Otherwise stable appearance of the brain including old small LEFT cerebellar infarct and mild to moderate chronic small vessel ischemic disease.  Dg Chest 2 View 10/28/2016 No active cardiopulmonary disease.   TTE pending   PHYSICAL EXAM  Temp:  [97.7  F (36.5 C)-98.3 F (36.8 C)] 98.2 F (36.8 C) (01/18 0843) Pulse Rate:  [70-82] 76 (01/18 0843) Resp:  [12-21] 20 (01/18 0843) BP: (129-185)/(54-100) 155/71 (01/18 0843) SpO2:  [93 %-99 %] 95 % (01/18 0843) Weight:  [164 lb 14.4 oz (74.8 kg)] 164 lb 14.4 oz (74.8 kg) (01/17 2215)  General - Well nourished, well developed, in no apparent distress.  Ophthalmologic - Fundi not visualized due to noncooperation.  Cardiovascular - Regular rate and rhythm.  Mental Status -  Awake alert, following simple commands but  not two step commands. Orientated to people, but not to time or place, fund of knowledge impaired. Expressive aphasia with word salad, pt seems not aware of it, indicating receptive aphasia also. Name 2/3 and able to repeat. No neglect. Not able to calculate.   Cranial Nerves II - XII - II - Visual field intact OU. III, IV, VI - Extraocular movements intact. V - Facial sensation intact bilaterally. VII - Facial movement intact bilaterally. VIII - Hearing & vestibular intact bilaterally. X - Palate elevates symmetrically. XI - Chin turning & shoulder shrug intact bilaterally. XII - Tongue protrusion intact.  Motor Strength - The patient's strength was normal in all extremities and pronator drift was absent.  Bulk was normal and fasciculations were absent.   Motor Tone - Muscle tone was assessed at the neck and appendages and was normal.  Reflexes - The patient's reflexes were 1+ in all extremities and he had no pathological reflexes.  Sensory - Light touch, temperature/pinprick were assessed and were symmetrical.    Coordination - The patient had normal movements in the hands with no ataxia or dysmetria.  Tremor was absent.  Gait and Station - walk with walker in hallway, no fall tendency, no dragging of legs.   ASSESSMENT/PLAN Mr. Jonathan [name] is a 83 y.o. male with history of CAD s/p CABG, HTN, PAF not on AC, and diet controlled DB presenting with altered mental status and R sided numbness. He did not receive IV t-PA due to delay in arrival.   Stroke:   left thalamic infarct embolic secondary to known atrial fibrillation not on [facility]  MRI  L thalamic infarct  CTA head & neck  R P1 occlusion likely chronic. Extensive atherosclerosis/stenosis in anterior and posterior circulation. Severe aortic atherosclerosis with shaggy plaque. Severe stenosis origin R VA.   2D Echo  pending  LDL 126  HgbA1c pending  Lovenox 40 mg sq daily for VTE prophylaxis  Diet Heart Room service  appropriate? Yes; Fluid consistency: Thin  No antithrombotic prior to admission, now on aspirin 325 mg daily. Recommend anticoagulation for stroke prevention due to afib. Pt seems not tolerating Xarelto well, will recommend switch to eliquis 5mg  bid.   Patient counseled to be compliant with his antithrombotic medications  Ongoing aggressive stroke risk factor management  Therapy recommendations:  pending   Disposition:  pending   Atrial Fibrillation  Home anticoagulation:  No antithrombotic   Dx with AF in setting of PNA 2 years ago. saw Dr. Wynonia Lawman as an OP. given low risk, pt opted not to take [facility].   Had been on xarelto up until 1 mo ago but opted not to take due to fatigue.  Due to stroke, recommend AC with eliquis 5mg  bid.    Hypertension  Stable  Permissive hypertension (OK if < 180/105) but gradually normalize in 5-7 days  Long-term BP goal normotensive  Hyperlipidemia  Home meds:  No statin  LDL 126, goal <  42  Recommend pravastatin 20mg  daily  Continue statin at discharge  Diabetes type 2  Diet controlled  HgbA1c pending, goal < 7.0  Other Stroke Risk Factors  Advanced age  ETOH use, advised to drink no more than 2 drink(s) a day  CAD s/p CABG  Other issue  Follows with Dr. Rexene Alberts in clinic for dizziness and cognitive decline.  Hospital day # 0  Neurology will sign off. Please call with questions. Pt will follow up with Dr. Rexene Alberts at [facility] in about 6 weeks. Thanks for the consult.  Rosalin Hawking, MD PhD Stroke Neurology 10/29/2016 2:54 PM   To contact Stroke Continuity provider, please refer to [email or web address] After hours, contact General Neurology

## 2016-10-29 NOTE — Progress Notes (Signed)
Pt pass swallow test, on call coverage notifed for diet order

## 2016-10-30 ENCOUNTER — Encounter (HOSPITAL_COMMUNITY): Payer: Self-pay

## 2016-10-30 ENCOUNTER — Inpatient Hospital Stay (HOSPITAL_COMMUNITY)
Admission: RE | Admit: 2016-10-30 | Discharge: 2016-11-10 | DRG: 884 | Disposition: A | Discharge status: Home Health | Payer: Medicare Other | Source: Intra-hospital | Attending: Physical Medicine & Rehabilitation | Admitting: Physical Medicine & Rehabilitation

## 2016-10-30 DIAGNOSIS — Z79899 Other long term (current) drug therapy: Secondary | ICD-10-CM

## 2016-10-30 DIAGNOSIS — E1165 Type 2 diabetes mellitus with hyperglycemia: Secondary | ICD-10-CM | POA: Diagnosis present

## 2016-10-30 DIAGNOSIS — R4701 Aphasia: Secondary | ICD-10-CM

## 2016-10-30 DIAGNOSIS — Z87891 Personal history of nicotine dependence: Secondary | ICD-10-CM

## 2016-10-30 DIAGNOSIS — F329 Major depressive disorder, single episode, unspecified: Secondary | ICD-10-CM | POA: Diagnosis present

## 2016-10-30 DIAGNOSIS — F09 Unspecified mental disorder due to known physiological condition: Secondary | ICD-10-CM | POA: Diagnosis not present

## 2016-10-30 DIAGNOSIS — I639 Cerebral infarction, unspecified: Secondary | ICD-10-CM | POA: Diagnosis not present

## 2016-10-30 DIAGNOSIS — G8191 Hemiplegia, unspecified affecting right dominant side: Secondary | ICD-10-CM

## 2016-10-30 DIAGNOSIS — F05 Delirium due to known physiological condition: Secondary | ICD-10-CM | POA: Diagnosis not present

## 2016-10-30 DIAGNOSIS — E785 Hyperlipidemia, unspecified: Secondary | ICD-10-CM | POA: Diagnosis present

## 2016-10-30 DIAGNOSIS — I69351 Hemiplegia and hemiparesis following cerebral infarction affecting right dominant side: Secondary | ICD-10-CM | POA: Diagnosis not present

## 2016-10-30 DIAGNOSIS — R4189 Other symptoms and signs involving cognitive functions and awareness: Secondary | ICD-10-CM | POA: Diagnosis not present

## 2016-10-30 DIAGNOSIS — Z951 Presence of aortocoronary bypass graft: Secondary | ICD-10-CM

## 2016-10-30 DIAGNOSIS — I25810 Atherosclerosis of coronary artery bypass graft(s) without angina pectoris: Secondary | ICD-10-CM | POA: Diagnosis not present

## 2016-10-30 DIAGNOSIS — I2510 Atherosclerotic heart disease of native coronary artery without angina pectoris: Secondary | ICD-10-CM | POA: Diagnosis not present

## 2016-10-30 DIAGNOSIS — R059 Cough, unspecified: Secondary | ICD-10-CM

## 2016-10-30 DIAGNOSIS — Z9842 Cataract extraction status, left eye: Secondary | ICD-10-CM

## 2016-10-30 DIAGNOSIS — I69398 Other sequelae of cerebral infarction: Secondary | ICD-10-CM | POA: Diagnosis not present

## 2016-10-30 DIAGNOSIS — I480 Paroxysmal atrial fibrillation: Secondary | ICD-10-CM | POA: Diagnosis present

## 2016-10-30 DIAGNOSIS — I10 Essential (primary) hypertension: Secondary | ICD-10-CM | POA: Diagnosis present

## 2016-10-30 DIAGNOSIS — H9190 Unspecified hearing loss, unspecified ear: Secondary | ICD-10-CM | POA: Diagnosis present

## 2016-10-30 DIAGNOSIS — I69319 Unspecified symptoms and signs involving cognitive functions following cerebral infarction: Secondary | ICD-10-CM | POA: Diagnosis not present

## 2016-10-30 DIAGNOSIS — R05 Cough: Secondary | ICD-10-CM | POA: Diagnosis not present

## 2016-10-30 DIAGNOSIS — Z885 Allergy status to narcotic agent status: Secondary | ICD-10-CM | POA: Diagnosis not present

## 2016-10-30 DIAGNOSIS — G9340 Encephalopathy, unspecified: Secondary | ICD-10-CM | POA: Diagnosis not present

## 2016-10-30 DIAGNOSIS — E784 Other hyperlipidemia: Secondary | ICD-10-CM

## 2016-10-30 DIAGNOSIS — Z9841 Cataract extraction status, right eye: Secondary | ICD-10-CM | POA: Diagnosis not present

## 2016-10-30 DIAGNOSIS — Z888 Allergy status to other drugs, medicaments and biological substances status: Secondary | ICD-10-CM | POA: Diagnosis not present

## 2016-10-30 DIAGNOSIS — K5900 Constipation, unspecified: Secondary | ICD-10-CM | POA: Diagnosis present

## 2016-10-30 DIAGNOSIS — I6381 Other cerebral infarction due to occlusion or stenosis of small artery: Secondary | ICD-10-CM | POA: Diagnosis present

## 2016-10-30 LAB — HEMOGLOBIN A1C
Hgb A1c MFr Bld: 8.5 % — ABNORMAL HIGH (ref 4.8–5.6)
Mean Plasma Glucose: 197 mg/dL

## 2016-10-30 LAB — GLUCOSE, CAPILLARY: GLUCOSE-CAPILLARY: 221 mg/dL — AB (ref 65–99)

## 2016-10-30 MED ORDER — APIXABAN 5 MG PO TABS: 5.0000 mg | ORAL_TABLET | Freq: Two times a day (BID) | ORAL | 0 refills | Status: DC

## 2016-10-30 MED ORDER — ASPIRIN EC 81 MG PO TBEC
81.0000 mg | DELAYED_RELEASE_TABLET | Freq: Every day | ORAL | Status: DC
  Administered 2016-10-31 – 2016-11-10 (×11): 81 mg via ORAL
  Filled 2016-10-30 (×12): qty 1

## 2016-10-30 MED ORDER — ASPIRIN 81 MG PO TBEC: 81.0000 mg | DELAYED_RELEASE_TABLET | Freq: Every day | ORAL | 0 refills | Status: DC

## 2016-10-30 MED ORDER — ONDANSETRON HCL 4 MG/2ML IJ SOLN: 4.0000 mg | Freq: Four times a day (QID) | INTRAMUSCULAR | Status: DC | PRN

## 2016-10-30 MED ORDER — INSULIN ASPART 100 UNIT/ML ~~LOC~~ SOLN: 0.0000 [IU] | Freq: Three times a day (TID) | SUBCUTANEOUS | Status: DC

## 2016-10-30 MED ORDER — INSULIN ASPART 100 UNIT/ML ~~LOC~~ SOLN: 0.0000 [IU] | Freq: Every day | SUBCUTANEOUS | Status: DC

## 2016-10-30 MED ORDER — APIXABAN 5 MG PO TABS
5.0000 mg | ORAL_TABLET | Freq: Two times a day (BID) | ORAL | Status: DC
  Administered 2016-10-30 – 2016-11-10 (×22): 5 mg via ORAL
  Filled 2016-10-30 (×23): qty 1

## 2016-10-30 MED ORDER — INSULIN ASPART 100 UNIT/ML ~~LOC~~ SOLN
0.0000 [IU] | Freq: Every day | SUBCUTANEOUS | Status: DC
  Administered 2016-10-30: 2 [IU] via SUBCUTANEOUS

## 2016-10-30 MED ORDER — PRAVASTATIN SODIUM 20 MG PO TABS
20.0000 mg | ORAL_TABLET | Freq: Every day | ORAL | Status: DC
  Administered 2016-10-31 – 2016-11-09 (×10): 20 mg via ORAL
  Filled 2016-10-30 (×10): qty 1

## 2016-10-30 MED ORDER — GLIPIZIDE 5 MG PO TABS: 5.0000 mg | ORAL_TABLET | Freq: Every day | ORAL | 0 refills | Status: DC

## 2016-10-30 MED ORDER — INSULIN ASPART 100 UNIT/ML ~~LOC~~ SOLN: 0.0000 [IU] | Freq: Three times a day (TID) | SUBCUTANEOUS | 11 refills | Status: DC

## 2016-10-30 MED ORDER — ONDANSETRON HCL 4 MG PO TABS
4.0000 mg | ORAL_TABLET | Freq: Four times a day (QID) | ORAL | Status: DC | PRN
Start: 2016-10-30 — End: 2016-11-10

## 2016-10-30 MED ORDER — ISOSORBIDE MONONITRATE ER 30 MG PO TB24
15.0000 mg | ORAL_TABLET | Freq: Every day | ORAL | Status: DC
  Administered 2016-10-31 – 2016-11-10 (×11): 15 mg via ORAL
  Filled 2016-10-30 (×12): qty 1

## 2016-10-30 MED ORDER — ENSURE ENLIVE PO LIQD
237.0000 mL | Freq: Two times a day (BID) | ORAL | Status: DC
  Administered 2016-10-31 – 2016-11-07 (×10): 237 mL via ORAL

## 2016-10-30 MED ORDER — INSULIN ASPART 100 UNIT/ML ~~LOC~~ SOLN
0.0000 [IU] | Freq: Three times a day (TID) | SUBCUTANEOUS | Status: DC
  Administered 2016-10-31: 2 [IU] via SUBCUTANEOUS
  Administered 2016-10-31 (×2): 3 [IU] via SUBCUTANEOUS
  Administered 2016-11-01: 2 [IU] via SUBCUTANEOUS
  Administered 2016-11-01 (×2): 3 [IU] via SUBCUTANEOUS
  Administered 2016-11-02: 2 [IU] via SUBCUTANEOUS
  Administered 2016-11-02: 3 [IU] via SUBCUTANEOUS
  Administered 2016-11-02: 2 [IU] via SUBCUTANEOUS
  Administered 2016-11-03: 1 [IU] via SUBCUTANEOUS
  Administered 2016-11-03 (×2): 3 [IU] via SUBCUTANEOUS
  Administered 2016-11-04 – 2016-11-06 (×4): 2 [IU] via SUBCUTANEOUS
  Administered 2016-11-07: 8 [IU] via SUBCUTANEOUS
  Administered 2016-11-08: 2 [IU] via SUBCUTANEOUS
  Administered 2016-11-08: 3 [IU] via SUBCUTANEOUS
  Administered 2016-11-09 (×2): 2 [IU] via SUBCUTANEOUS

## 2016-10-30 MED ORDER — ACETAMINOPHEN 650 MG RE SUPP: 650.0000 mg | RECTAL | Status: DC | PRN

## 2016-10-30 MED ORDER — ENSURE ENLIVE PO LIQD: 237.0000 mL | Freq: Two times a day (BID) | ORAL | 12 refills | Status: DC

## 2016-10-30 MED ORDER — ACETAMINOPHEN 325 MG PO TABS
650.0000 mg | ORAL_TABLET | ORAL | Status: DC | PRN
  Administered 2016-11-01 – 2016-11-05 (×2): 650 mg via ORAL
  Filled 2016-10-30 (×2): qty 2

## 2016-10-30 MED ORDER — SORBITOL 70 % SOLN
30.0000 mL | Freq: Every day | Status: DC | PRN
  Administered 2016-11-07: 30 mL via ORAL
  Filled 2016-10-30: qty 30

## 2016-10-30 MED ORDER — INSULIN ASPART 100 UNIT/ML ~~LOC~~ SOLN: 0.0000 [IU] | Freq: Every day | SUBCUTANEOUS | 11 refills | Status: DC

## 2016-10-30 MED ORDER — ACETAMINOPHEN 160 MG/5ML PO SOLN: 650.0000 mg | ORAL | Status: DC | PRN

## 2016-10-30 NOTE — Evaluation (Signed)
Speech Language Pathology Evaluation Patient Details Name: Jonathan [name] MRN: [id number] DOB: 03-06-1929 Today's Date: 10/30/2016 Time: [id number] SLP Time Calculation (min) (ACUTE ONLY): 60 min  Problem List:  Patient Active Problem List   Diagnosis Date Noted  . Cerebral thrombosis with cerebral infarction 10/29/2016  . Acute CVA (cerebrovascular accident) ([place]) 10/29/2016  . Confusion 10/28/2016  . Syncope 10/28/2016  . Long-term (current) use of anticoagulants 10/17/2015  . Typical atrial flutter ([place])   . Diabetes mellitus without complication ([place]) [id number]  . Paroxysmal atrial fibrillation ([facility])   . Coronary atherosclerosis of native coronary artery 07/14/2013  . Mixed hyperlipidemia 07/14/2013  . Hypertensive heart disease without CHF    Past Medical History:  Past Medical History:  Diagnosis Date  . Allergic rhinitis   . BPH (benign prostatic hypertrophy)   . Coronary atherosclerosis of native coronary artery   . Degeneration of lumbar or lumbosacral intervertebral disc   . Diabetes ([place])    no meds  . Dyslipidemia   . HOH (hard of hearing)   . Hyperlipidemia   . Hypertension   . Right leg weakness   . S/P CABG x 4 2006   LIMA-LAD, SVG-rPDA, sSVG-OM1-OM2  . Stroke ([place])   . Wears glasses    reading   Past Surgical History:  Past Surgical History:  Procedure Laterality Date  . bil carpal tunnel surgery    . CARDIAC CATHETERIZATION N/A 10/01/2015   Procedure: Left Heart Cath and Cors/Grafts Angiography;  Surgeon: Peter M Martinique, MD;  Location: [place] CV [facility];  Service: Cardiovascular;  Laterality: N/A;  . COLONOSCOPY    . CORONARY ARTERY BYPASS GRAFT  2006    Coronary artery bypass grafting x 4 (left internal mammary  . DUPUYTREN CONTRACTURE RELEASE Left 04/05/2014   Procedure: EXCISION DUPUYTREN LEFT PALM, RING, SMALL, AND THUMB;  Surgeon: Cammie Sickle, MD;  Location: [place];  Service: Orthopedics;  Laterality: Left;  . EYE  SURGERY     both cataracts  . HEMORRHOID SURGERY    . LAMINECTOMY    . ROTATOR CUFF REPAIR    . TONSILLECTOMY    . TRANSURETHRAL RESECTION OF PROSTATE     HPI:  AADHAV [name] a 83 y.o.right handed malewith history of CAD status post CABG 2007, hypertension, PAF on Xareltoin the past. Presented 10/28/2016 with altered mental status right-sided numbness and slurred speech. Per chart review patient lives with his 30 year old wife. Reported to be independent prior to admission still driving.Son lives next door and works during the day.MRI imaging revealed acute 15 x 11 mm left thalamus infarct, old small LEFT cerebellar infarct and mild to moderate chronic small vessel ischemic disease. CT angiogram head and neck showed right posterior cerebral artery P1 occlusion. Atherosclerotic type changes. 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 Patient did not receive TPA. Tolerating a regular consistency diet per MD.    Assessment / Plan / Recommendation Clinical Impression  Patient presents with mild deficits auditory comprehension, moderate deficits in following sequential commands, mild impairment in naming, moderate deficits in writing. Speech  is noted to be circumlocutory, occasionally empty, and with semantic paraphasias and word finding difficulty. Patient displays reduced awareness/insight into deficits. Provided extensive education to patient and family regarding receptive and expressive deficits and follow-up recommendations. Suspect patient will likely have reading deficits and recommend further testing to assess this domain. He was administered the WAB-Bedside form; bedside aphasia score of 71.6, indicating mild-moderate severity. 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 Recommend skilled SLP services to address above deficits. Patient would benefit from IP Rehab consult. SLP will follow acutely.    SLP Assessment  Patient needs continued Speech Lanaguage Pathology Services     Follow Up Recommendations  Inpatient Rehab    Frequency and Duration min 2x/week  2 weeks      SLP Evaluation Cognition  Overall Cognitive Status: Impaired/Different from baseline Arousal/Alertness: Awake/alert Orientation Level: Oriented to person;Oriented to place;Oriented to time;Oriented to situation Attention: Focused Focused Attention: Appears intact Awareness: Impaired Awareness Impairment: Emergent impairment Executive Function: Decision Making Decision Making: Impaired Decision Making Impairment: Functional basic Behaviors: Poor frustration tolerance;Confabulation;Verbal agitation;Perseveration Safety/Judgment: Impaired Comments: poor awareness of deficits       Comprehension  Auditory Comprehension Overall Auditory Comprehension: Impaired Yes/No Questions: Within Functional Limits Commands: Impaired One Step Basic Commands: 75-100% accurate Two Step Basic Commands: 0-24% accurate Conversation: Simple EffectiveTechniques: Slowed speech;Pausing;Extra processing time Visual Recognition/Discrimination Discrimination: Not tested Reading Comprehension Reading Status: Not tested    Expression Expression Primary Mode of Expression: Verbal Verbal Expression Overall Verbal Expression: Impaired Initiation: No impairment Repetition: No impairment Naming: Impairment Responsive: 51-75% accurate Confrontation: Not tested Convergent: Not tested Divergent: Not tested Verbal Errors: Jargon;Not aware of errors Effective Techniques: Semantic cues Written Expression Dominant Hand: Right Written Expression: Exceptions to [facility] Dictation Ability: Word Self Formulation Ability: Word   Oral / Motor  Oral Motor/Sensory Function Overall Oral Motor/Sensory Function: Within functional limits Motor Speech Overall Motor Speech: Appears within functional limits for tasks assessed   [place], MS CF-SLP Speech-Language  Pathologist [place] 10/30/2016, 4:46 PM

## 2016-10-30 NOTE — Progress Notes (Signed)
Jonathan Staggers, MD Physician Signed Physical Medicine and Rehabilitation  Consult Note Date of Service: 10/29/2016 2:05 PM  Related encounter: ED to Hosp-Admission (Discharged) from 10/28/2016 in [facility] All Collapse All   _0 Hide copied text _1 Hover for attribution information      Physical Medicine and Rehabilitation Consult Reason for Consult: Acute left thalamic infarct Referring Physician: [facility]   HPI: Jonathan [name] is a 83 y.o. right handed male with history of CAD status post CABG 2007, hypertension, PAF on Xarelto in the past. Presented 10/28/2016 with altered mental status right-sided numbness and slurred speech. Per chart review patient lives with her 3 year old wife. Reported to be independent prior to admission still driving. Son lives next door and works during the day. MRI imaging revealed acute 15 x 11 mm left thalamus infarct. CT angiogram head and neck showed right posterior cerebral artery P1 occlusion. Atherosclerotic type changes. 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 Patient did not receive TPA. EEG and echocardiogram are pending. Neurology follow-up currently on aspirin for CVA prophylaxis. Subcutaneous Lovenox for DVT prophylaxis. Tolerating a regular consistency diet. Physical therapy evaluation completed 10/29/2016 with recommendations of physical medicine rehabilitation consult.   Review of Systems  Constitutional: Negative for chills, diaphoresis and fever.  HENT: Positive for hearing loss.   Eyes: Negative for blurred vision and double vision.  Respiratory: Negative for cough and shortness of breath.   Cardiovascular: Positive for palpitations and leg swelling. Negative for chest pain.  Gastrointestinal: Positive for constipation. Negative for nausea and vomiting.  Genitourinary: Positive for frequency and urgency.  Musculoskeletal: Positive for falls, joint pain and myalgias.  Skin: Negative  for rash.  Neurological: Positive for speech change. Negative for seizures and loss of consciousness.  Psychiatric/Behavioral: Positive for memory loss.  All other systems reviewed and are negative.      Past Medical History:  Diagnosis Date  . Allergic rhinitis   . BPH (benign prostatic hypertrophy)   . Coronary atherosclerosis of native coronary artery   . Degeneration of lumbar or lumbosacral intervertebral disc   . Diabetes (Jonathan [name])    no meds  . Dyslipidemia   . HOH (hard of hearing)   . Hyperlipidemia   . Hypertension   . Right leg weakness   . S/P CABG x 4 2006   LIMA-LAD, SVG-rPDA, sSVG-OM1-OM2  . Stroke (Jonathan [name])   . Wears glasses    reading        Past Surgical History:  Procedure Laterality Date  . bil carpal tunnel surgery    . CARDIAC CATHETERIZATION N/A 10/01/2015   Procedure: Left Heart Cath and Cors/Grafts Angiography;  Surgeon: Peter M Martinique, MD;  Location: [facility];  Service: Cardiovascular;  Laterality: N/A;  . COLONOSCOPY    . CORONARY ARTERY BYPASS GRAFT  2006    Coronary artery bypass grafting x 4 (left internal mammary  . DUPUYTREN CONTRACTURE RELEASE Left 04/05/2014   Procedure: EXCISION DUPUYTREN LEFT PALM, RING, SMALL, AND THUMB;  Surgeon: Cammie Sickle, MD;  Location: [facility];  Service: Orthopedics;  Laterality: Left;  . EYE SURGERY     both cataracts  . HEMORRHOID SURGERY    . LAMINECTOMY    . ROTATOR CUFF REPAIR    . TONSILLECTOMY    . TRANSURETHRAL RESECTION OF PROSTATE          Family History  Problem Relation Age of Onset  . Hypertension Father  Social History:  reports that he has quit smoking. He has never used smokeless tobacco. He reports that he drinks alcohol. He reports that he does not use drugs. Allergies:       Allergies  Allergen Reactions  . Amlodipine Other (See Comments)    Fatigue   . Codeine     unknown  . Metformin And Related Other  (See Comments)    fatigue  . Statins     "pass out, dizzy, drunk feeling, poor balance"  . Welchol [Colesevelam Hcl]          Medications Prior to Admission  Medication Sig Dispense Refill  . isosorbide mononitrate (IMDUR) 30 MG 24 hr tablet Take 1 tablet (30 mg total) by mouth daily. (Patient taking differently: Take 15 mg by mouth daily. ) 30 tablet 1    Home: [place] expects to be discharged to:: Private residence Living Arrangements: Spouse/significant other (pt unable to recall, detail provided by son) Available Help at Discharge: Family, Available 24 hours/day Type of Home: House Home Access: Level entry Home Layout: One level Bathroom Shower/Tub: Multimedia programmer: Handicapped height Home Equipment: Shower seat Additional Comments: Pt unable to recall living situation, details provided by son.   Functional History: Prior Function Level of Independence: Independent Comments: enjoys Dealer work Functional Status:  Mobility: Bed Mobility Overal bed mobility: Needs Assistance Bed Mobility: Sit to Supine Supine to sit: Min guard Sit to supine: Min assist General bed mobility comments: Pt using rail for assist Transfers Overall transfer level: Needs assistance Equipment used: Rolling walker (2 wheeled), None Transfers: Sit to/from Stand Sit to Stand: Min assist General transfer comment: repeated cues for safety needed, pt not consistenlty following commands Ambulation/Gait Ambulation/Gait assistance: Museum/gallery curator (Feet): 110 Feet Assistive device: Rolling walker (2 wheeled) Gait Pattern/deviations: Step-through pattern, Decreased step length - right, Decreased step length - left, Trunk flexed General Gait Details: Pt having difficulty with Rt UE grip of rw, poor safety awareness using rw, assist needed to avoid objects in room.  Gait velocity: decreased  ADL: ADL Overall ADL's : Needs  assistance/impaired Eating/Feeding: Set up, Sitting Eating/Feeding Details (indicate cue type and reason): Son reports pt having difficulty self feeding with use of R hand but able to complete with increased time. Grooming: Min guard, Sitting Upper Body Bathing: Min guard, Sitting Lower Body Bathing: Sit to/from stand Upper Body Dressing : Min guard, Sitting Lower Body Dressing: Sit to/from stand, Moderate assistance Lower Body Dressing Details (indicate cue type and reason): Min assist to don socks sitting EOB Toilet Transfer: Minimal assistance, Ambulation, BSC, RW, Cueing for safety, Cueing for sequencing Toilet Transfer Details (indicate cue type and reason): Simulated by sit to stand from EOB with short distance functional mobility in room. Functional mobility during ADLs: Minimal assistance, Rolling walker, Cueing for safety, Cueing for sequencing General ADL Comments: Discussed post acute rehab with pt and son; they are agreeable.  Cognition: Cognition Overall Cognitive Status: Impaired/Different from baseline Orientation Level: Oriented to person, Disoriented to time, Disoriented to situation, Disoriented to place Cognition Arousal/Alertness: Awake/alert Behavior During Therapy: Flat affect Overall Cognitive Status: Impaired/Different from baseline Area of Impairment: Orientation, Following commands, Safety/judgement, Awareness Orientation Level: Disoriented to, Person, Time, Situation Memory: Decreased short-term memory Following Commands: Follows one step commands inconsistently Safety/Judgement: Decreased awareness of safety, Decreased awareness of deficits Awareness: Intellectual Problem Solving: Slow processing, Requires verbal cues, Requires tactile cues General Comments: Pt unable to identify "cactus" "hammock" on NIH picture sheet.  Difficulty with DOB. Cues for sequencing and initiation.  Blood pressure (!) 155/71, pulse 76, temperature 98.2 F (36.8 C), temperature  source Oral, resp. rate 20, height _0  (1.803 m), weight 74.8 kg (164 lb 14.4 oz), SpO2 95 %. Physical Exam  HENT:  Head: Normocephalic.  Eyes: Conjunctivae and EOM are normal. Pupils are equal, round, and reactive to light.  Neck: Normal range of motion. Neck supple. No thyromegaly present.  Cardiovascular:  Cardiac rate controlled  Respiratory: Effort normal and breath sounds normal. No respiratory distress.  GI: Soft. Bowel sounds are normal. He exhibits no distension.  Neurological: He is alert.  Makes good eye contact with examiner. He was able to provide his name and age needed some subtle prompting for date of birth. Follows simple commands but delays in processing. Decreased awareness. Speech slurred. Seemed to display expressive language deficits also. Motor 4+/5 RUE and 4 to 4+/5 RLE. LUE and LLE 4+ to 5/5. Sensation diminished trace amount RUE, RLE. +pronator drift.  Skin: Skin is warm and dry.    Lab Results Last 24 Hours       Results for orders placed or performed during the hospital encounter of 10/28/16 (from the past 24 hour(s))  CBC with Differential     Status: Abnormal   Collection Time: 10/28/16  5:54 PM  Result Value Ref Range   WBC 8.6 4.0 - 10.5 K/uL   RBC 4.86 4.22 - 5.81 MIL/uL   Hemoglobin 12.9 (L) 13.0 - 17.0 g/dL   HCT 38.5 (L) 39.0 - 52.0 %   MCV 79.2 78.0 - 100.0 fL   MCH 26.5 26.0 - 34.0 pg   MCHC 33.5 30.0 - 36.0 g/dL   RDW 14.0 11.5 - 15.5 %   Platelets 241 150 - 400 K/uL   Neutrophils Relative % 76 %   Neutro Abs 6.6 1.7 - 7.7 K/uL   Lymphocytes Relative 15 %   Lymphs Abs 1.3 0.7 - 4.0 K/uL   Monocytes Relative 6 %   Monocytes Absolute 0.5 0.1 - 1.0 K/uL   Eosinophils Relative 2 %   Eosinophils Absolute 0.2 0.0 - 0.7 K/uL   Basophils Relative 1 %   Basophils Absolute 0.0 0.0 - 0.1 K/uL  Basic metabolic panel     Status: Abnormal   Collection Time: 10/28/16  5:54 PM  Result Value Ref Range   Sodium 139 135 - 145  mmol/L   Potassium 3.9 3.5 - 5.1 mmol/L   Chloride 104 101 - 111 mmol/L   CO2 27 22 - 32 mmol/L   Glucose, Bld 133 (H) 65 - 99 mg/dL   BUN 15 6 - 20 mg/dL   Creatinine, Ser 0.71 0.61 - 1.24 mg/dL   Calcium 9.2 8.9 - 10.3 mg/dL   GFR calc non Af Amer >60 >60 mL/min   GFR calc Af Amer >60 >60 mL/min   Anion gap 8 5 - 15  I-stat troponin, ED     Status: None   Collection Time: 10/28/16  6:21 PM  Result Value Ref Range   Troponin i, poc 0.00 0.00 - 0.08 ng/mL   Comment 3          Urinalysis, Routine w reflex microscopic     Status: None   Collection Time: 10/28/16  7:00 PM  Result Value Ref Range   Color, Urine YELLOW YELLOW   APPearance CLEAR CLEAR   Specific Gravity, Urine 1.014 1.005 - 1.030   pH 7.0 5.0 - 8.0  Glucose, UA NEGATIVE NEGATIVE mg/dL   Hgb urine dipstick NEGATIVE NEGATIVE   Bilirubin Urine NEGATIVE NEGATIVE   Ketones, ur NEGATIVE NEGATIVE mg/dL   Protein, ur NEGATIVE NEGATIVE mg/dL   Nitrite NEGATIVE NEGATIVE   Leukocytes, UA NEGATIVE NEGATIVE  .Cooxemetry Panel (carboxy, met, total hgb, O2 sat)     Status: None   Collection Time: 10/28/16  8:25 PM  Result Value Ref Range   Total hemoglobin 12.3 12.0 - 16.0 g/dL   O2 Saturation 95.3 %   Carboxyhemoglobin 1.1 0.5 - 1.5 %   Methemoglobin 1.0 0.0 - 1.5 %  Lipid panel     Status: Abnormal   Collection Time: 10/29/16  6:49 AM  Result Value Ref Range   Cholesterol 183 0 - 200 mg/dL   Triglycerides 161 (H) <150 mg/dL   HDL 25 (L) >40 mg/dL   Total CHOL/HDL Ratio 7.3 RATIO   VLDL 32 0 - 40 mg/dL   LDL Cholesterol 126 (H) 0 - 99 mg/dL      Imaging Results (Last 48 hours)  Ct Angio Head W Or Wo Contrast  Addendum Date: 10/29/2016   ADDENDUM REPORT: 10/29/2016 02:03 ADDENDUM: These results were called by telephone at the time of interpretation on 10/29/2016 at 2:02 am to Dr. Loleta Books, who verbally acknowledged these results. Electronically Signed   By: Kristine Garbe M.D.   On: 10/29/2016 02:03   Result Date: 10/29/2016 CLINICAL DATA:  83 y/o  M; stroke patient with follow-up scan. EXAM: CT ANGIOGRAPHY HEAD AND NECK TECHNIQUE: Multidetector CT imaging of the head and neck was performed using the standard protocol during bolus administration of intravenous contrast. Multiplanar CT image reconstructions and MIPs were obtained to evaluate the vascular anatomy. Carotid stenosis measurements (when applicable) are obtained utilizing NASCET criteria, using the distal internal carotid diameter as the denominator. CONTRAST:  50 cc Isovue 370 COMPARISON:  10/28/2016 MRI of the head FINDINGS: CTA NECK FINDINGS Aortic arch: Severe aortic atherosclerosis with calcification and shaggy fibrofatty plaque. Bovine aortic arch, normal variant. Common origin of left vertebral artery and left subclavian artery. No significant stenosis of great vessel origins. Right carotid system: No evidence of dissection, stenosis (50% or greater) or occlusion. Mild mixed plaque of the bifurcation. Left carotid system: No evidence of dissection, stenosis (50% or greater) or occlusion. Predominantly fibrofatty plaque of the bifurcation with mild 40% stenosis of proximal ICA. Vertebral arteries: Right vertebral origin mixed plaque with severe stenosis (series 8, image 140). No evidence of dissection or occlusion. Skeleton: Moderate cervical spondylosis with discogenic and facet degenerative changes greatest at the C5 through C7 levels. No high-grade bony canal stenosis or foraminal narrowing. Other neck: Lipoma centered in the right anterior neck at the level of larynx anterior to sternocleidomastoid muscle measuring 40 x 21 x 42 mm (AP x ML x CC) series 5, image 70 and series 8, image 100. Subcentimeter calcified thyroid nodules. Upper chest: Negative. Review of the MIP images confirms the above findings CTA HEAD FINDINGS Anterior circulation: Calcified plaque of bilateral cavernous and  paraclinoid internal carotid artery is without high-grade stenosis. Irregularity of bilateral M1 segments with distal right M1 mild stenosis and no significant left-sided stenosis. Several short segments of mild stenosis of bilateral ACA and MCA. Posterior circulation: Irregularity of right V4 segment with mild stenosis. Calcified plaque of left V4 segment without significant stenosis. Mild narrowing of the upper basilar artery. Severe stenosis of proximal right AICA. Short segment of moderate stenosis of left P1 segment. Proximal right  P1 occlusion (series 7, image 101 and series 11, image 26). Venous sinuses: As permitted by contrast timing, patent. Anatomic variants: Small anterior communicating artery. No posterior communicating artery identified, likely hypoplastic or absent. Delayed phase: No abnormal intracranial enhancement. Review of the MIP images confirms the above findings IMPRESSION: 1. Right posterior cerebral artery P1 occlusion. 2. Otherwise no evidence for large vessel occlusion, aneurysm, or vascular malformation of circle of Willis. 3. Extensive intracranial atherosclerosis with multiple segments of stenosis in the anterior and posterior circulation. 4. Severe aortic atherosclerosis with calcified and shaggy fibrofatty plaque. 5. Mild 40% stenosis of proximal left ICA. 6. 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 7. Otherwise carotid and vertebral arteries of the neck are patent without evidence of occlusion, aneurysm, dissection, or high-grade stenosis. 8. 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 Electronically Signed: By: Kristine Garbe M.D. On: 10/29/2016 01:53   Dg Chest 2 View  Result Date: 10/28/2016 CLINICAL DATA:  Altered mental status EXAM: CHEST  2 VIEW COMPARISON:  10/02/2015 FINDINGS: AP and lateral views of the chest show low volumes. The cardio pericardial silhouette is enlarged. There is pulmonary vascular congestion without overt pulmonary edema.  Patient is status post CABG. IMPRESSION: No active cardiopulmonary disease. Electronically Signed   By: Misty Stanley M.D.   On: 10/28/2016 18:34   Ct Head Wo Contrast  Result Date: 10/28/2016 CLINICAL DATA:  Altered mental status possible fall EXAM: CT HEAD WITHOUT CONTRAST TECHNIQUE: Contiguous axial images were obtained from the base of the skull through the vertex without intravenous contrast. COMPARISON:  MRI 02/21/2016, CT 08/07/2015 FINDINGS: Brain: No acute territorial infarction, intracranial hemorrhage or extra-axial fluid collection is seen. No focal mass, mass effect or midline shift. Mild to moderate periventricular white matter hypodensity consistent with small vessel disease. Old left thalamic lacunar infarcts as before. Old left cerebellar infarct as before. Ventricles are similar in size compared to the prior study. There is mild atrophy. Vascular: No hyperdense vessels.  Carotid artery calcifications. Skull: Mastoid air cells clear.  No skull fracture Sinuses/Orbits: Mucosal thickening in the maxillary ethmoid and sphenoid sinuses. No acute orbital abnormality. Bilateral lens extraction. Other: None IMPRESSION: No definite CT evidence for acute intracranial abnormality Electronically Signed   By: Donavan Foil M.D.   On: 10/28/2016 18:49   Ct Angio Neck W Or Wo Contrast  Addendum Date: 10/29/2016   ADDENDUM REPORT: 10/29/2016 02:03 ADDENDUM: These results were called by telephone at the time of interpretation on 10/29/2016 at 2:02 am to Dr. Loleta Books, who verbally acknowledged these results. Electronically Signed   By: Kristine Garbe M.D.   On: 10/29/2016 02:03   Result Date: 10/29/2016 CLINICAL DATA:  83 y/o  M; stroke patient with follow-up scan. EXAM: CT ANGIOGRAPHY HEAD AND NECK TECHNIQUE: Multidetector CT imaging of the head and neck was performed using the standard protocol during bolus administration of intravenous contrast. Multiplanar CT image reconstructions and MIPs  were obtained to evaluate the vascular anatomy. Carotid stenosis measurements (when applicable) are obtained utilizing NASCET criteria, using the distal internal carotid diameter as the denominator. CONTRAST:  50 cc Isovue 370 COMPARISON:  10/28/2016 MRI of the head FINDINGS: CTA NECK FINDINGS Aortic arch: Severe aortic atherosclerosis with calcification and shaggy fibrofatty plaque. Bovine aortic arch, normal variant. Common origin of left vertebral artery and left subclavian artery. No significant stenosis of great vessel origins. Right carotid system: No evidence of dissection, stenosis (50% or greater) or occlusion. Mild mixed plaque of the bifurcation. Left  carotid system: No evidence of dissection, stenosis (50% or greater) or occlusion. Predominantly fibrofatty plaque of the bifurcation with mild 40% stenosis of proximal ICA. Vertebral arteries: Right vertebral origin mixed plaque with severe stenosis (series 8, image 140). No evidence of dissection or occlusion. Skeleton: Moderate cervical spondylosis with discogenic and facet degenerative changes greatest at the C5 through C7 levels. No high-grade bony canal stenosis or foraminal narrowing. Other neck: Lipoma centered in the right anterior neck at the level of larynx anterior to sternocleidomastoid muscle measuring 40 x 21 x 42 mm (AP x ML x CC) series 5, image 70 and series 8, image 100. Subcentimeter calcified thyroid nodules. Upper chest: Negative. Review of the MIP images confirms the above findings CTA HEAD FINDINGS Anterior circulation: Calcified plaque of bilateral cavernous and paraclinoid internal carotid artery is without high-grade stenosis. Irregularity of bilateral M1 segments with distal right M1 mild stenosis and no significant left-sided stenosis. Several short segments of mild stenosis of bilateral ACA and MCA. Posterior circulation: Irregularity of right V4 segment with mild stenosis. Calcified plaque of left V4 segment without significant  stenosis. Mild narrowing of the upper basilar artery. Severe stenosis of proximal right AICA. Short segment of moderate stenosis of left P1 segment. Proximal right P1 occlusion (series 7, image 101 and series 11, image 26). Venous sinuses: As permitted by contrast timing, patent. Anatomic variants: Small anterior communicating artery. No posterior communicating artery identified, likely hypoplastic or absent. Delayed phase: No abnormal intracranial enhancement. Review of the MIP images confirms the above findings IMPRESSION: 1. Right posterior cerebral artery P1 occlusion. 2. Otherwise no evidence for large vessel occlusion, aneurysm, or vascular malformation of circle of Willis. 3. Extensive intracranial atherosclerosis with multiple segments of stenosis in the anterior and posterior circulation. 4. Severe aortic atherosclerosis with calcified and shaggy fibrofatty plaque. 5. Mild 40% stenosis of proximal left ICA. 6. 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 7. Otherwise carotid and vertebral arteries of the neck are patent without evidence of occlusion, aneurysm, dissection, or high-grade stenosis. 8. 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 Electronically Signed: By: Kristine Garbe M.D. On: 10/29/2016 01:53   Mr Brain Wo Contrast  Result Date: 10/28/2016 CLINICAL DATA:  [place] while shoveling snow. Confusion, change in speech. History of hypertension, hyperlipidemia. EXAM: MRI HEAD WITHOUT CONTRAST TECHNIQUE: Multiplanar, multiecho pulse sequences of the brain and surrounding structures were obtained without intravenous contrast. COMPARISON:  CT HEAD October 28, 2016 and MRI head Feb 21, 2016 FINDINGS: BRAIN: 15 x 11 mm focus of reduced diffusion mesial LEFT thalamus with low ADC values. 3 mm T2 hyperintensity LEFT thalamus most consistent with perivascular space, present on prior MRI. No susceptibility artifact to suggest hemorrhage. Ventricles and sulci are normal for patient's age.  Old small LEFT cerebellar infarct. Patchy supratentorial white matter FLAIR T2 hyperintensities. No abnormal extra-axial fluid collections. VASCULAR: Similar probable slow flow RIGHT vertebral artery, major intracranial vascular flow voids otherwise maintained. SKULL AND UPPER CERVICAL SPINE: No abnormal sellar expansion. No suspicious calvarial bone marrow signal. Craniocervical junction maintained. SINUSES/ORBITS: Trace paranasal sinus mucosal thickening. Mastoid air cells are well aerated. Status post bilateral ocular lens implants. The included ocular globes and orbital contents are non-suspicious. OTHER: None. IMPRESSION: Acute 15 x 11 mm LEFT thalamus infarct. Otherwise stable appearance of the brain including old small LEFT cerebellar infarct and mild to moderate chronic small vessel ischemic disease. Electronically Signed   By: Elon Alas M.D.   On: 10/28/2016 21:28  Assessment/Plan: Diagnosis: left thalamic infarct with right hemisensory deficits, ongoing cognitive deficits 1. Does the need for close, 24 hr/day medical supervision in concert with the patient's rehab needs make it unreasonable for this patient to be served in a less intensive setting? Yes 2. Co-Morbidities requiring supervision/potential complications: diabetes which requires regulation, coronary artery disease with labile blood pressures, Afib requiring monitoring during physical activity 3. Due to bladder management, bowel management, safety, skin/wound care, disease management, medication administration, pain management and patient education, does the patient require 24 hr/day rehab nursing? Yes 4. Does the patient require coordinated care of a physician, rehab nurse, PT (1-2 hrs/day, 5 days/week), OT (1-2 hrs/day, 5 days/week) and SLP (1-2 hrs/day, 5 days/week) to address physical and functional deficits in the context of the above medical diagnosis(es)? Yes Addressing deficits in the following areas: balance,  endurance, locomotion, strength, transferring, bowel/bladder control, bathing, dressing, feeding, grooming, toileting, cognition and psychosocial support 5. Can the patient actively participate in an intensive therapy program of at least 3 hrs of therapy per day at least 5 days per week? Yes 6. The potential for patient to make measurable gains while on inpatient rehab is excellent 7. Anticipated functional outcomes upon discharge from inpatient rehab are supervision  with PT, supervision with OT, supervision with SLP. 8. Estimated rehab length of stay to reach the above functional goals is: 7-10 days 9. Does the patient have adequate social supports and living environment to accommodate these discharge functional goals? Yes 10. Anticipated D/C setting: Home 11. Anticipated post D/C treatments: HH therapy and Outpatient therapy 12. Overall Rehab/Functional Prognosis: excellent  RECOMMENDATIONS: This patient's condition is appropriate for continued rehabilitative care in the following setting: CIR Patient has agreed to participate in recommended program. N/A Note that insurance prior authorization may be required for reimbursement for recommended care.  Comment: Spoke with son at length. Pt still with significant balance and cognitive deficits. Wife at home can provide supervision only. Patient himself was independent prior to admit.   Rehab Admissions Coordinator to follow up.  Thanks,  Jonathan Staggers, MD, Mellody Drown    Cathlyn Parsons., PA-C 10/29/2016    Revision History                             Routing History

## 2016-10-30 NOTE — Clinical Social Work Note (Signed)
Clinical Social Work Assessment  Patient Details  Name: Jonathan [name] MRN: [id number] Date of Birth: April 04, 1929  Date of referral:  10/30/16               Reason for consult:  Facility Placement                Permission sought to share information with:  Facility Sport and exercise psychologist, Family Supports Permission granted to share information::  Yes, Verbal Permission Granted  Name::     Engineer, maintenance::  SNF  Relationship::  son  Contact Information:     Housing/Transportation Living arrangements for the past 2 months:  Fate of Information:  Patient, Adult Children Patient Interpreter Needed:  None Criminal Activity/Legal Involvement Pertinent to Current Situation/Hospitalization:  No - Comment as needed Significant Relationships:  Adult Children, Spouse Lives with:  Spouse Do you feel safe going back to the place where you live?  No Need for family participation in patient care:  Yes (Comment)  Care giving concerns:  Pt lives at home with spouse- son lives close by- would have enough physical support but concerns about pt having successful recovery from stroke with mobility and speech if returned home.   Social Worker assessment / plan:  CSW discussed CIR vs SNF and SNF referral process.  Pt alert during intial  Interview but non-verbal  Employment status:  Retired Nurse, adult PT Recommendations:  Inpatient [facility] / Referral to community resources:  [facility]  Patient/Family's Response to care:  Pt initially did not express opinion and son was in agreement to SNF back up for CIR but later in the day patient became more alert and expressed strong feelings against going to rehab- wanted to return home.  Son continued to express concerns about this and particularly wanted CIR.  Patient/Family's Understanding of and Emotional Response to Diagnosis, Current Treatment, and Prognosis:  No questions  or concerns- just worried that pt would not recovery well at home with limited skilled support.  Emotional Assessment Appearance:  Appears stated age Attitude/Demeanor/Rapport:    Affect (typically observed):  Appropriate Orientation:  Oriented to Situation, Oriented to  Time, Oriented to Self, Oriented to Place Alcohol / Substance use:  Not Applicable Psych involvement (Current and /or in the community):  No (Comment)  Discharge Needs  Concerns to be addressed:  Care Coordination Readmission within the last 30 days:  No Current discharge risk:  Physical Impairment Barriers to Discharge:  Continued Medical Work up   Jonathan Ny, LCSW 10/30/2016, 5:05 PM

## 2016-10-30 NOTE — Discharge Summary (Signed)
Physician Discharge Summary  Jonathan [name] [id number] DOB: 04/01/1929 DOA: 10/28/2016  PCP: Irven Shelling, MD  Admit date: 10/28/2016 Discharge date: 10/30/2016  Time spent: 35 minutes  Recommendations for Outpatient Follow-up:  D/c to CIR outpt follow up with neurology  Discharge Diagnoses:  Principal Problem:   Cerebral thrombosis with cerebral infarction  Active Problems:   Coronary atherosclerosis of native coronary artery   Hypertensive heart disease without CHF   Paroxysmal atrial fibrillation (HCC)   Diabetes mellitus type 2, uncontrolled ([place])   Syncope   Acute CVA (cerebrovascular accident) ([place])   Expressive aphasia   Discharge Condition: fair  Diet recommendation: heart healthy / carb mofdified  Filed Weights   10/28/16 2215  Weight: 74.8 kg (164 lb 14.4 oz)    History of present illness:  83 year old male with history of CAD status post CABG in 2007, hypertension, paroxysmal A. fib (Xarelto after discussing with cardiologist one month back since patient had vague symptoms including GI upset and weakness), diet-controlled diabetes mellitus presented with slurred speech, confusion and right-sided weakness. Patient found to have acute left thalamic infarct.  Hospital Course:  Principal problem Acute left thalamus infarct [facility]) Risk factors include hypertension, CAD, hyperlipidemia , uncontrolled DM and paroxysmal A. fib. 2d echo without cardiac source of emboli. CT angiogram without significant carotid stenosis. Stroke team following. PT recommends CIR. EEG negative for seizure activity. CHADS2 vasc score of 6 remain at 13.6% per year risk of stroke/TIA/time embolism. Discussed with patient and son at bedside who agree with trying different anticoagulant. Will start him on Eliquis  and see for any obvious side effects (seen possibly with Xarelto). If patient unable to tolerate eliquis, switch to aspirin and Plavix. Discussed plan with  stroke team who  agree.  EEG negative for seizure activity.   LDL 126, Cannot place him on statin due to allergy. A1c pending. Allow permissive hypertension.  CAD with history of CABG On full dose aspirin. If patient started on anticoagulation will reduce to baby aspirin. There are low 126. Will start on Lipitor. Patient had cardiac cath pain 09/2015 which showed severe three-vessel coronary artery disease and cardiology recommended optimal medical management.  Essential hypertension Allowing permissive blood pressure.   Paroxysmal A. fib This was diagnosed during his hospitalization in 09/2015. He was started on Xarelto at that time but since patient complained to his cardiologist of vague GI discomfort and weakness and wishes not to continue anticoagulation he was taken off Xarelto about 3-4 weeks back. Given his higher CHADS2vasc with acute stroke, discussed and evaluation options with patient's son who agrees with starting him on  Eliquis.  Diabetes mellitus type 2, uncontrolled. Was on diet only. A1C of 8.5 . Added glipizide.  Monitor on SSI.  Hyperlipidemia   LDL of 126. Cannot add statin due to listed allergy. As per son when he discussed this with his mother, she doesnot recall pt ever being on statin. So they are not sure if pt has allergy to statin.  He is going to confirm with her further.     Family Communication  : Son at bedside  Disposition Plan  : CIR   Consults  :   Stroke CIR  Procedures  :  CT head MRI brain CT angiogram head and neck 2-D echo  EEG  Discharge Exam: Vitals:   10/30/16 0830 10/30/16 1400  BP: (!) 164/82 (!) 145/66  Pulse: 69 75  Resp: 20 18  Temp: 97.5 F (36.4 C) 98.6 F (37 C)  Gen: not in distress, better conversant and expressive aphasia better. HEENT: moist mucosa, supple neck Chest: clear b/l, no added sounds CVS: N S1&S2, no murmurs,  GI: soft, NT, ND,  Musculoskeletal: warm, no edema [id number] and oriented 2-3,   4+/5 in both right upper and lower extremity)   Discharge Instructions   Discharge Instructions    Ambulatory referral to Neurology    Complete by:  As directed    Follow up with Dr. Rexene Alberts at [facility] in about 2 months. Thanks.     Current Discharge Medication List    START taking these medications   Details  apixaban (ELIQUIS) 5 MG TABS tablet Take 1 tablet (5 mg total) by mouth 2 (two) times daily. Qty: 60 tablet, Refills: 0    aspirin EC 81 MG EC tablet Take 1 tablet (81 mg total) by mouth daily. Qty: 30 tablet, Refills: 0    feeding supplement, ENSURE ENLIVE, (ENSURE ENLIVE) LIQD Take 237 mLs by mouth 2 (two) times daily between meals. Qty: 237 mL, Refills: 12    glipiZIDE (GLUCOTROL) 5 MG tablet Take 1 tablet (5 mg total) by mouth daily before breakfast. Qty: 30 tablet, Refills: 0    insulin aspart (NOVOLOG) 100 UNIT/ML injection Inject 0-15 Units into the skin 3 (three) times daily with meals. Qty: 10 mL, Refills: 11    insulin aspart (NOVOLOG) 100 UNIT/ML injection Inject 0-5 Units into the skin at bedtime. Qty: 10 mL, Refills: 11        CONTINUE these medications which have NOT CHANGED   Details  isosorbide mononitrate (IMDUR) 30 MG 24 hr tablet Take 1 tablet (30 mg total) by mouth daily. Qty: 30 tablet, Refills: 1       Allergies  Allergen Reactions  . Amlodipine Other (See Comments)    Fatigue   . Codeine     unknown  . Metformin And Related Other (See Comments)    fatigue  . Statins     "pass out, dizzy, drunk feeling, poor balance"  . Welchol [Colesevelam Hcl]    Follow-up Information    Star Age, MD. Schedule an appointment as soon as possible for a visit in 6 week(s).   Specialties:  Neurology, Radiology Contact information: [address] [place] [address] [phone]            The results of significant diagnostics from this hospitalization (including imaging, microbiology, ancillary and laboratory) are  listed below for reference.    Significant Diagnostic Studies: Ct Angio Head W Or Wo Contrast  Addendum Date: 10/29/2016   ADDENDUM REPORT: 10/29/2016 02:03 ADDENDUM: These results were called by telephone at the time of interpretation on 10/29/2016 at 2:02 am to Dr. Loleta Books, who verbally acknowledged these results. Electronically Signed   By: Kristine Garbe M.D.   On: 10/29/2016 02:03   Result Date: 10/29/2016 CLINICAL DATA:  83 y/o  M; stroke patient with follow-up scan. EXAM: CT ANGIOGRAPHY HEAD AND NECK TECHNIQUE: Multidetector CT imaging of the head and neck was performed using the standard protocol during bolus administration of intravenous contrast. Multiplanar CT image reconstructions and MIPs were obtained to evaluate the vascular anatomy. Carotid stenosis measurements (when applicable) are obtained utilizing NASCET criteria, using the distal internal carotid diameter as the denominator. CONTRAST:  50 cc Isovue 370 COMPARISON:  10/28/2016 MRI of the head FINDINGS: CTA NECK FINDINGS Aortic arch: Severe aortic atherosclerosis with calcification and shaggy fibrofatty plaque. Bovine aortic arch, normal variant. Common origin of left vertebral artery  and left subclavian artery. No significant stenosis of great vessel origins. Right carotid system: No evidence of dissection, stenosis (50% or greater) or occlusion. Mild mixed plaque of the bifurcation. Left carotid system: No evidence of dissection, stenosis (50% or greater) or occlusion. Predominantly fibrofatty plaque of the bifurcation with mild 40% stenosis of proximal ICA. Vertebral arteries: Right vertebral origin mixed plaque with severe stenosis (series 8, image 140). No evidence of dissection or occlusion. Skeleton: Moderate cervical spondylosis with discogenic and facet degenerative changes greatest at the C5 through C7 levels. No high-grade bony canal stenosis or foraminal narrowing. Other neck: Lipoma centered in the right anterior neck  at the level of larynx anterior to sternocleidomastoid muscle measuring 40 x 21 x 42 mm (AP x ML x CC) series 5, image 70 and series 8, image 100. Subcentimeter calcified thyroid nodules. Upper chest: Negative. Review of the MIP images confirms the above findings CTA HEAD FINDINGS Anterior circulation: Calcified plaque of bilateral cavernous and paraclinoid internal carotid artery is without high-grade stenosis. Irregularity of bilateral M1 segments with distal right M1 mild stenosis and no significant left-sided stenosis. Several short segments of mild stenosis of bilateral ACA and MCA. Posterior circulation: Irregularity of right V4 segment with mild stenosis. Calcified plaque of left V4 segment without significant stenosis. Mild narrowing of the upper basilar artery. Severe stenosis of proximal right AICA. Short segment of moderate stenosis of left P1 segment. Proximal right P1 occlusion (series 7, image 101 and series 11, image 26). Venous sinuses: As permitted by contrast timing, patent. Anatomic variants: Small anterior communicating artery. No posterior communicating artery identified, likely hypoplastic or absent. Delayed phase: No abnormal intracranial enhancement. Review of the MIP images confirms the above findings IMPRESSION: 1. Right posterior cerebral artery P1 occlusion. 2. Otherwise no evidence for large vessel occlusion, aneurysm, or vascular malformation of circle of Willis. 3. Extensive intracranial atherosclerosis with multiple segments of stenosis in the anterior and posterior circulation. 4. Severe aortic atherosclerosis with calcified and shaggy fibrofatty plaque. 5. Mild 40% stenosis of proximal left ICA. 6. 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 7. Otherwise carotid and vertebral arteries of the neck are patent without evidence of occlusion, aneurysm, dissection, or high-grade stenosis. 8. 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 Electronically Signed: By: Kristine Garbe M.D. On: 10/29/2016 01:53   Dg Chest 2 View  Result Date: 10/28/2016 CLINICAL DATA:  Altered mental status EXAM: CHEST  2 VIEW COMPARISON:  10/02/2015 FINDINGS: AP and lateral views of the chest show low volumes. The cardio pericardial silhouette is enlarged. There is pulmonary vascular congestion without overt pulmonary edema. Patient is status post CABG. IMPRESSION: No active cardiopulmonary disease. Electronically Signed   By: Misty Stanley M.D.   On: 10/28/2016 18:34   Ct Head Wo Contrast  Result Date: 10/28/2016 CLINICAL DATA:  Altered mental status possible fall EXAM: CT HEAD WITHOUT CONTRAST TECHNIQUE: Contiguous axial images were obtained from the base of the skull through the vertex without intravenous contrast. COMPARISON:  MRI 02/21/2016, CT 08/07/2015 FINDINGS: Brain: No acute territorial infarction, intracranial hemorrhage or extra-axial fluid collection is seen. No focal mass, mass effect or midline shift. Mild to moderate periventricular white matter hypodensity consistent with small vessel disease. Old left thalamic lacunar infarcts as before. Old left cerebellar infarct as before. Ventricles are similar in size compared to the prior study. There is mild atrophy. Vascular: No hyperdense vessels.  Carotid artery calcifications. Skull: Mastoid air cells clear.  No skull fracture Sinuses/Orbits:  Mucosal thickening in the maxillary ethmoid and sphenoid sinuses. No acute orbital abnormality. Bilateral lens extraction. Other: None IMPRESSION: No definite CT evidence for acute intracranial abnormality Electronically Signed   By: Donavan Foil M.D.   On: 10/28/2016 18:49   Ct Angio Neck W Or Wo Contrast  Addendum Date: 10/29/2016   ADDENDUM REPORT: 10/29/2016 02:03 ADDENDUM: These results were called by telephone at the time of interpretation on 10/29/2016 at 2:02 am to Dr. Loleta Books, who verbally acknowledged these results. Electronically Signed   By: Kristine Garbe M.D.    On: 10/29/2016 02:03   Result Date: 10/29/2016 CLINICAL DATA:  83 y/o  M; stroke patient with follow-up scan. EXAM: CT ANGIOGRAPHY HEAD AND NECK TECHNIQUE: Multidetector CT imaging of the head and neck was performed using the standard protocol during bolus administration of intravenous contrast. Multiplanar CT image reconstructions and MIPs were obtained to evaluate the vascular anatomy. Carotid stenosis measurements (when applicable) are obtained utilizing NASCET criteria, using the distal internal carotid diameter as the denominator. CONTRAST:  50 cc Isovue 370 COMPARISON:  10/28/2016 MRI of the head FINDINGS: CTA NECK FINDINGS Aortic arch: Severe aortic atherosclerosis with calcification and shaggy fibrofatty plaque. Bovine aortic arch, normal variant. Common origin of left vertebral artery and left subclavian artery. No significant stenosis of great vessel origins. Right carotid system: No evidence of dissection, stenosis (50% or greater) or occlusion. Mild mixed plaque of the bifurcation. Left carotid system: No evidence of dissection, stenosis (50% or greater) or occlusion. Predominantly fibrofatty plaque of the bifurcation with mild 40% stenosis of proximal ICA. Vertebral arteries: Right vertebral origin mixed plaque with severe stenosis (series 8, image 140). No evidence of dissection or occlusion. Skeleton: Moderate cervical spondylosis with discogenic and facet degenerative changes greatest at the C5 through C7 levels. No high-grade bony canal stenosis or foraminal narrowing. Other neck: Lipoma centered in the right anterior neck at the level of larynx anterior to sternocleidomastoid muscle measuring 40 x 21 x 42 mm (AP x ML x CC) series 5, image 70 and series 8, image 100. Subcentimeter calcified thyroid nodules. Upper chest: Negative. Review of the MIP images confirms the above findings CTA HEAD FINDINGS Anterior circulation: Calcified plaque of bilateral cavernous and paraclinoid internal carotid  artery is without high-grade stenosis. Irregularity of bilateral M1 segments with distal right M1 mild stenosis and no significant left-sided stenosis. Several short segments of mild stenosis of bilateral ACA and MCA. Posterior circulation: Irregularity of right V4 segment with mild stenosis. Calcified plaque of left V4 segment without significant stenosis. Mild narrowing of the upper basilar artery. Severe stenosis of proximal right AICA. Short segment of moderate stenosis of left P1 segment. Proximal right P1 occlusion (series 7, image 101 and series 11, image 26). Venous sinuses: As permitted by contrast timing, patent. Anatomic variants: Small anterior communicating artery. No posterior communicating artery identified, likely hypoplastic or absent. Delayed phase: No abnormal intracranial enhancement. Review of the MIP images confirms the above findings IMPRESSION: 1. Right posterior cerebral artery P1 occlusion. 2. Otherwise no evidence for large vessel occlusion, aneurysm, or vascular malformation of circle of Willis. 3. Extensive intracranial atherosclerosis with multiple segments of stenosis in the anterior and posterior circulation. 4. Severe aortic atherosclerosis with calcified and shaggy fibrofatty plaque. 5. Mild 40% stenosis of proximal left ICA. 6. 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 7. Otherwise carotid and vertebral arteries of the neck are patent without evidence of occlusion, aneurysm, dissection, or high-grade stenosis. 8. 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 Electronically Signed: By: Kristine Garbe M.D. On: 10/29/2016 01:53   Mr Brain Wo Contrast  Result Date: 10/28/2016 CLINICAL DATA:  [place] while shoveling snow. Confusion, change in speech. History of hypertension, hyperlipidemia. EXAM: MRI HEAD WITHOUT CONTRAST TECHNIQUE: Multiplanar, multiecho pulse sequences of the brain and surrounding structures were obtained without intravenous contrast. COMPARISON:   CT HEAD October 28, 2016 and MRI head Feb 21, 2016 FINDINGS: BRAIN: 15 x 11 mm focus of reduced diffusion mesial LEFT thalamus with low ADC values. 3 mm T2 hyperintensity LEFT thalamus most consistent with perivascular space, present on prior MRI. No susceptibility artifact to suggest hemorrhage. Ventricles and sulci are normal for patient's age. Old small LEFT cerebellar infarct. Patchy supratentorial white matter FLAIR T2 hyperintensities. No abnormal extra-axial fluid collections. VASCULAR: Similar probable slow flow RIGHT vertebral artery, major intracranial vascular flow voids otherwise maintained. SKULL AND UPPER CERVICAL SPINE: No abnormal sellar expansion. No suspicious calvarial bone marrow signal. Craniocervical junction maintained. SINUSES/ORBITS: Trace paranasal sinus mucosal thickening. Mastoid air cells are well aerated. Status post bilateral ocular lens implants. The included ocular globes and orbital contents are non-suspicious. OTHER: None. IMPRESSION: Acute 15 x 11 mm LEFT thalamus infarct. Otherwise stable appearance of the brain including old small LEFT cerebellar infarct and mild to moderate chronic small vessel ischemic disease. Electronically Signed   By: Elon Alas M.D.   On: 10/28/2016 21:28    Microbiology: No results found for this or any previous visit (from the past 240 hour(s)).   Labs: Basic Metabolic Panel:  Recent Labs Lab 10/28/16 1754  NA 139  K 3.9  CL 104  CO2 27  GLUCOSE 133*  BUN 15  CREATININE 0.71  CALCIUM 9.2   Liver Function Tests: No results for input(s): AST, ALT, ALKPHOS, BILITOT, PROT, ALBUMIN in the last 168 hours. No results for input(s): LIPASE, AMYLASE in the last 168 hours. No results for input(s): AMMONIA in the last 168 hours. CBC:  Recent Labs Lab 10/28/16 1754  WBC 8.6  NEUTROABS 6.6  HGB 12.9*  HCT 38.5*  MCV 79.2  PLT 241   Cardiac Enzymes: No results for input(s): CKTOTAL, CKMB, CKMBINDEX, TROPONINI in the last  168 hours. BNP: BNP (last 3 results) No results for input(s): BNP in the last 8760 hours.  ProBNP (last 3 results) No results for input(s): PROBNP in the last 8760 hours.  CBG: No results for input(s): GLUCAP in the last 168 hours.     Signed:  Louellen Molder MD.  [facility] 10/30/2016, 6:09 PM

## 2016-10-30 NOTE — Progress Notes (Signed)
Patient ID: Jonathan [name], male   DOB: 1929-06-22, 83 y.o.   MRN: [id number]  Pt arrived via w/c to rm 4MW7 with daughter at bedside. Oriented to floor. Call bell in reach with personal belongings. Urinal at bedside. Bed alarm on. Oriented daughter about pt fall risk and to call before getting OOB. Family understood and teachback used. VSS and 0/10 for pain.

## 2016-10-30 NOTE — Progress Notes (Signed)
Jonathan Gong, RN Rehab Admission Coordinator Signed Physical Medicine and Rehabilitation  PMR Pre-admission Date of Service: 10/30/2016 2:49 PM  Related encounter: ED to Hosp-Admission (Discharged) from 10/28/2016 in [facility]       [] Hide copied text PMR Admission Coordinator Pre-Admission Assessment  Patient: Jonathan [name] is an 83 y.o., male MRN: [id number] DOB: 12/25/1928 Height: 5\' 11"  (180.3 cm) Weight: 74.8 kg (164 lb 14.4 oz)                                                                                                                                                  Insurance Information HMO: yes    PPO:      PCP:      IPA:      80/20:      OTHER: medicare advantage plan          PRIMARY: Blue Medicare      Policy#: [id number]      Subscriber: pt CM Name: Dallas Breeding    Phone#: [phone]     Fax#: [phone] Pre-Cert#: [id number]      Employer: retired approved for [employer] for 7 days when updates are due Benefits:  Phone #: [phone]     Name: 10/30/16 Eff. Date: 10/12/16     Deduct: none      Out of Pocket Max: $5500      Life Max: none CIR: $300 co pay per day days 1-6 then covers 100%      SNF: no co pay days 1-20; $167 co pay per day days 21-100 Outpatient: $40 co pay per visit      Co-Pay: visits per medical necessity Home Health: 100%      Co-Pay: visits per medical necessity DME: 80%     Co-Pay: 20% Providers: in network  SECONDARY: none        Medicaid Application Date:       Case Manager:  Disability Application Date:       Case Worker:   Emergency Contact Information        Contact Information    Name Relation Home Work [place] Spouse [place] Son [phone]       Current Medical History  Patient Admitting Diagnosis: acute left thalamic infarct  History of Present Illness: HPI: Jonathan [name] a 83 y.o.right handed malewith history of CAD status post CABG 2007,  hypertension, PAF on Xareltoin the past. Presented 10/28/2016 with altered mental status right-sided numbness and slurred speech. MRI imaging revealed acute 15 x 11 mm left thalamus infarct. CT angiogram head and neck showed right posterior cerebral artery P1 occlusion. Atherosclerotic type changes. 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 Patient did not receive TPA. Echocardiogram with ejection fraction of 123456 grade 1 diastolic dysfunction. EEG suggests possibility of mild encephalopathy.  Negative for seizure.. Neurology follow-up currently onaspirin and Eliquis for CVA prophylaxis. Tolerating a regular consistency diet  .Total: 2 NIHSS  Past Medical History      Past Medical History:  Diagnosis Date  . Allergic rhinitis   . BPH (benign prostatic hypertrophy)   . Coronary atherosclerosis of native coronary artery   . Degeneration of lumbar or lumbosacral intervertebral disc   . Diabetes ([place])    no meds  . Dyslipidemia   . HOH (hard of hearing)   . Hyperlipidemia   . Hypertension   . Right leg weakness   . S/P CABG x 4 2006   LIMA-LAD, SVG-rPDA, sSVG-OM1-OM2  . Stroke ([place])   . Wears glasses    reading    Family History  family history includes Hypertension in his father.  Prior Rehab/Hospitalizations:  Has the patient had major surgery during 100 days prior to admission? No  Current Medications   Current Facility-Administered Medications:  .  acetaminophen (TYLENOL) tablet 650 mg, 650 mg, Oral, Q4H PRN **OR** acetaminophen (TYLENOL) solution 650 mg, 650 mg, Per Tube, Q4H PRN **OR** acetaminophen (TYLENOL) suppository 650 mg, 650 mg, Rectal, Q4H PRN, Edwin Dada, MD .  apixaban (ELIQUIS) tablet 5 mg, 5 mg, Oral, BID, Nishant Dhungel, MD, 5 mg at 10/30/16 0950 .  aspirin EC tablet 81 mg, 81 mg, Oral, Daily, Nishant Dhungel, MD, 81 mg at 10/30/16 0950 .  feeding supplement (ENSURE ENLIVE) (ENSURE ENLIVE) liquid 237 mL, 237 mL, Oral,  BID BM, Nishant Dhungel, MD, 237 mL at 10/30/16 1344 .  [START ON 10/31/2016] insulin aspart (novoLOG) injection 0-15 Units, 0-15 Units, Subcutaneous, TID WC, Nishant Dhungel, MD .  insulin aspart (novoLOG) injection 0-5 Units, 0-5 Units, Subcutaneous, QHS, Nishant Dhungel, MD .  isosorbide mononitrate (IMDUR) 24 hr tablet 15 mg, 15 mg, Oral, Daily, Edwin Dada, MD, 15 mg at 10/30/16 0950 .  pravastatin (PRAVACHOL) tablet 20 mg, 20 mg, Oral, q1800, Rosalin Hawking, MD, 20 mg at 10/30/16 1749  Patients Current Diet: Diet heart healthy/carb modified Room service appropriate? 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 None of the above  Prior Functional Level Prior Function Level of Independence: Independent Comments: enjoys Dealer work  Self Care: Did the patient need help bathing, dressing, using the toilet or eating?  Independent  Indoor Mobility: Did the patient need assistance with walking from room to room (with or without device)? Independent  Stairs: Did the patient need assistance with internal or external stairs (with or without device)? Independent  Functional Cognition: Did the patient need help planning regular tasks such as shopping or remembering to take medications? Independent  Current Functional Level Cognition  Arousal/Alertness: Awake/alert Overall Cognitive Status: Impaired/Different from baseline Orientation Level: Oriented to person, Oriented to place, Oriented to time,  Oriented to situation Following Commands: Follows one step commands inconsistently Safety/Judgement: Decreased awareness of safety, Decreased awareness of deficits General Comments: Pt reports he can walk fine and is unaware of his deficits at this time.   Attention: Focused Focused Attention: Appears intact Awareness: Impaired Awareness Impairment: Emergent impairment Executive  Function: Decision Making Decision Making: Impaired Decision Making Impairment: Functional basic Behaviors: Poor frustration tolerance, Confabulation, Verbal agitation, Perseveration Safety/Judgment: Impaired Comments: poor awareness of deficits    Extremity Assessment (includes Sensation/Coordination)  Upper Extremity Assessment: Defer to OT evaluation RUE Deficits / Details: Full ROM. Decreased grip strength and coordination. RUE Coordination: decreased fine motor, decreased gross motor  Lower Extremity Assessment: RLE deficits/detail RLE Deficits / Details: grossly decreased strength    ADLs  Overall ADL's : Needs assistance/impaired Eating/Feeding: Set up, Sitting Eating/Feeding Details (indicate cue type and reason): Son reports pt having difficulty self feeding with use of R hand but able to complete with increased time. Grooming: Min guard, Sitting Upper Body Bathing: Min guard, Sitting Lower Body Bathing: Sit to/from stand Upper Body Dressing : Min guard, Sitting Lower Body Dressing: Sit to/from stand, Moderate assistance Lower Body Dressing Details (indicate cue type and reason): Min assist to don socks sitting EOB Toilet Transfer: Minimal assistance, Ambulation, BSC, RW, Cueing for safety, Cueing for sequencing Toilet Transfer Details (indicate cue type and reason): Simulated by sit to stand from EOB with short distance functional mobility in room. Functional mobility during ADLs: Minimal assistance, Rolling walker, Cueing for safety, Cueing for sequencing General ADL Comments: Discussed post  acute rehab with pt and son; they are agreeable.    Mobility  Overal bed mobility: Needs Assistance Bed Mobility: Supine to Sit, Sit to Supine Supine to sit: Supervision Sit to supine: Supervision General bed mobility comments: Cues for LE position and advancement to improve ease.      Transfers  Overall transfer level: Needs assistance Equipment used: None Transfers: Sit to/from Stand Sit to Stand: Min assist General transfer comment: Cues for forward weight shifting and extension into upright standing.      Ambulation / Gait / Stairs / Wheelchair Mobility  Ambulation/Gait Ambulation/Gait assistance: Min assist, Mod assist Ambulation Distance (Feet): 100 Feet (x2 trials.  ) Assistive device: None (required increased assist due to balance without AD.  Performed without AD to challenge balance and allow patient to experience his true deficits based on PLOF.  ) Gait Pattern/deviations: Step-through pattern, Decreased weight shift to left, Trunk flexed, Antalgic General Gait Details: Performed without AD, constant cueing for weight shift L, R foot clearance, and forward gaze.  Pt frequently looking down due to poor propriception.   Gait velocity: decreased    Posture / Balance Balance Overall balance assessment: Needs assistance Sitting-balance support: Feet supported, No upper extremity supported Sitting balance-Leahy Scale: Fair Standing balance support: No upper extremity supported Standing balance-Leahy Scale: Poor Standing balance comment: physical assist needed for standing balance High Level Balance Comments: Performed Single limb stance with eyes open and B, and unilateral support.  Pt performed static stance with eyes open and closed.  LOB noted requiring assist to right and correct LOB.      Special needs/care consideration BiPAP/CPAP  N/a CPM  N/a Continuous Drip IV  N/a Dialysis  N/a Life Vest  N/a Oxygen  N/a Special Bed  N/a Trach Size  N/a Wound Vac  (area)  N/a Skin intact Bowel mgmt: LBM 10/30/16 continent Bladder mgmt:continent Diabetic mgmt Hgb A1c 8.5; known DM pta HOH   Previous Home Environment Living Arrangements: Spouse/significant other  Lives With: Spouse Available Help at Discharge: Family, Available 24 hours/day Type of Home: House Home Layout: One level Home Access: Level entry Bathroom Shower/Tub: Multimedia programmer: Handicapped height Bathroom Accessibility: Yes How Accessible: Accessible via walker [place]: No Additional  Comments: Pt unable to recall living situation, details provided by son.   Discharge Living Setting Plans for Discharge Living Setting: Patient's home, Lives with (comment) (spouse) Type of Home at Discharge: House Discharge Home Layout: One level Discharge Home Access: Level entry Discharge Bathroom Shower/Tub: Walk-in shower Discharge Bathroom Toilet: Handicapped height Discharge Bathroom Accessibility: Yes How Accessible: Accessible via walker Does the patient have any problems obtaining your medications?: No  Social/Family/Support Systems Patient Roles: Spouse, Parent Contact Information: Nellie, wife and son, [name] Anticipated Caregiver: wife and son, wife 82 yo  Anticipated Caregiver's Contact Information: see above Ability/Limitations of Caregiver: wife 11 yo and can provide supervision only; son lives next door Caregiver Availability: 24/7 Discharge Plan Discussed with Primary Caregiver: Yes Is Caregiver In Agreement with Plan?: Yes Does Caregiver/Family have Issues with Lodging/Transportation while Pt is in Rehab?: No  Goals/Additional Needs Patient/Family Goal for Rehab: supervision with PT, OT, and SLP Expected length of stay: ELOS 7-10 days Pt/Family Agrees to Admission and willing to participate: Yes Program Orientation Provided & Reviewed with Pt/Caregiver Including Roles  & Responsibilities: Yes  Decrease burden of Care through IP rehab  admission: n/a  Possible need for SNF placement upon discharge:not anticipated  Patient Condition: This patient's condition remains as documented in the consult dated 10/30/2016, in which the Rehabilitation Physician determined and documented that the patient's condition is appropriate for intensive rehabilitative care in an inpatient rehabilitation facility. Will admit to inpatient rehab today.  Preadmission Screen Completed By:  Cleatrice Burke, 10/30/2016 6:05 PM ______________________________________________________________________   Discussed status with Dr. Naaman Plummer on 10/30/2016 at 1806 and received telephone approval for admission today.  Admission Coordinator:  Cleatrice Burke, time [id number] Date 10/30/2016.       Cosigned by: [name], MD at 10/30/2016 6:09 PM  Revision History

## 2016-10-30 NOTE — PMR Pre-admission (Signed)
PMR Admission Coordinator Pre-Admission Assessment  Patient: Jonathan [name] is an 83 y.o., male MRN: [id number] DOB: Jan 17, 1929 Height: 5\' 11"  (180.3 cm) Weight: 74.8 kg (164 lb 14.4 oz)              Insurance Information HMO: yes    PPO:      PCP:      IPA:      80/20:      OTHER: medicare advantage plan  PRIMARY: Blue Medicare      Policy#: [phone]      Subscriber: pt CM Name: Dallas Breeding    Phone#: [phone]     Fax#: [phone] Pre-Cert#: [id number]      Employer: retired approved for [employer] for 7 days when updates are due Benefits:  Phone #: [phone]     Name: 10/30/16 Eff. Date: 10/12/16     Deduct: none      Out of Pocket Max: $5500      Life Max: none CIR: $300 co pay per day days 1-6 then covers 100%      SNF: no co pay days 1-20; $167 co pay per day days 21-100 Outpatient: $40 co pay per visit      Co-Pay: visits per medical necessity Home Health: 100%      Co-Pay: visits per medical necessity DME: 80%     Co-Pay: 20% Providers: in network  SECONDARY: none        Medicaid Application Date:       Case Manager:  Disability Application Date:       Case Worker:   Emergency Contact Information Contact Information    Name Relation Home Work [place] Spouse [place] Son [phone]       Current Medical History  Patient Admitting Diagnosis: acute left thalamic infarct  History of Present Illness: HPI: Jonathan [name] a 83 y.o.right handed malewith history of CAD status post CABG 2007, hypertension, PAF on Xareltoin the past. Presented 10/28/2016 with altered mental status right-sided numbness and slurred speech. MRI imaging revealed acute 15 x 11 mm left thalamus infarct. CT angiogram head and neck showed right posterior cerebral artery P1 occlusion. Atherosclerotic type changes. 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 Patient did not receive TPA. Echocardiogram with ejection fraction of 123456 grade 1 diastolic dysfunction. EEG  suggests possibility of mild encephalopathy. Negative for seizure.. Neurology follow-up currently on aspirin and Eliquis for CVA prophylaxis.  Tolerating a regular consistency diet  .Total: 2 NIHSS  Past Medical History  Past Medical History:  Diagnosis Date  . Allergic rhinitis   . BPH (benign prostatic hypertrophy)   . Coronary atherosclerosis of native coronary artery   . Degeneration of lumbar or lumbosacral intervertebral disc   . Diabetes ([place])    no meds  . Dyslipidemia   . HOH (hard of hearing)   . Hyperlipidemia   . Hypertension   . Right leg weakness   . S/P CABG x 4 2006   LIMA-LAD, SVG-rPDA, sSVG-OM1-OM2  . Stroke ([place])   . Wears glasses    reading    Family History  family history includes Hypertension in his father.  Prior Rehab/Hospitalizations:  Has the patient had major surgery during 100 days prior to admission? No  Current Medications   Current Facility-Administered Medications:  .  acetaminophen (TYLENOL) tablet 650 mg, 650 mg, Oral, Q4H PRN **OR** acetaminophen (TYLENOL) solution 650 mg, 650 mg, Per Tube, Q4H PRN **OR** acetaminophen (TYLENOL) suppository 650 mg,  650 mg, Rectal, Q4H PRN, Edwin Dada, MD .  apixaban (ELIQUIS) tablet 5 mg, 5 mg, Oral, BID, Nishant Dhungel, MD, 5 mg at 10/30/16 0950 .  aspirin EC tablet 81 mg, 81 mg, Oral, Daily, Nishant Dhungel, MD, 81 mg at 10/30/16 0950 .  feeding supplement (ENSURE ENLIVE) (ENSURE ENLIVE) liquid 237 mL, 237 mL, Oral, BID BM, Nishant Dhungel, MD, 237 mL at 10/30/16 1344 .  [START ON 10/31/2016] insulin aspart (novoLOG) injection 0-15 Units, 0-15 Units, Subcutaneous, TID WC, Nishant Dhungel, MD .  insulin aspart (novoLOG) injection 0-5 Units, 0-5 Units, Subcutaneous, QHS, Nishant Dhungel, MD .  isosorbide mononitrate (IMDUR) 24 hr tablet 15 mg, 15 mg, Oral, Daily, Edwin Dada, MD, 15 mg at 10/30/16 0950 .  pravastatin (PRAVACHOL) tablet 20 mg, 20 mg, Oral, q1800, Rosalin Hawking, MD, 20 mg at  10/30/16 1749  Patients Current Diet: Diet heart healthy/carb modified Room service appropriate? 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 None of the above  Prior Functional Level Prior Function Level of Independence: Independent Comments: enjoys Dealer work  Self Care: Did the patient need help bathing, dressing, using the toilet or eating?  Independent  Indoor Mobility: Did the patient need assistance with walking from room to room (with or without device)? Independent  Stairs: Did the patient need assistance with internal or external stairs (with or without device)? Independent  Functional Cognition: Did the patient need help planning regular tasks such as shopping or remembering to take medications? Independent  Current Functional Level Cognition  Arousal/Alertness: Awake/alert Overall Cognitive Status: Impaired/Different from baseline Orientation Level: Oriented to person, Oriented to place, Oriented to time, Oriented to situation Following Commands: Follows one step commands inconsistently Safety/Judgement: Decreased awareness of safety, Decreased awareness of deficits General Comments: Pt reports he can walk fine and is unaware of his deficits at this time.   Attention: Focused Focused Attention: Appears intact Awareness: Impaired Awareness Impairment: Emergent impairment Executive Function: Decision Making Decision Making: Impaired Decision Making Impairment: Functional basic Behaviors: Poor  frustration tolerance, Confabulation, Verbal agitation, Perseveration Safety/Judgment: Impaired Comments: poor awareness of deficits    Extremity Assessment (includes Sensation/Coordination)  Upper Extremity Assessment: Defer to OT evaluation RUE Deficits / Details: Full ROM. Decreased grip strength and coordination. RUE Coordination: decreased fine motor, decreased gross motor  Lower Extremity Assessment: RLE deficits/detail RLE Deficits / Details: grossly decreased strength    ADLs  Overall ADL's : Needs assistance/impaired Eating/Feeding: Set up, Sitting Eating/Feeding Details (indicate cue type and reason): Son reports pt having difficulty self feeding with use of R hand but able to complete with increased time. Grooming: Min guard, Sitting Upper Body Bathing: Min guard, Sitting Lower Body Bathing: Sit to/from stand Upper Body Dressing : Min guard, Sitting Lower Body Dressing: Sit to/from stand, Moderate assistance Lower Body Dressing Details (indicate cue type and reason): Min assist to don socks sitting EOB Toilet Transfer: Minimal assistance, Ambulation, BSC, RW, Cueing for safety, Cueing for sequencing Toilet Transfer Details (indicate cue type and reason): Simulated by sit to stand from EOB with short distance functional mobility in room.  Functional mobility during ADLs: Minimal assistance, Rolling walker, Cueing for safety, Cueing for sequencing General ADL Comments: Discussed post acute rehab with pt and son; they are agreeable.    Mobility  Overal bed mobility: Needs Assistance Bed Mobility: Supine to Sit, Sit to Supine Supine to sit: Supervision Sit to supine: Supervision General bed mobility comments: Cues for LE position and advancement to improve ease.      Transfers  Overall transfer level: Needs assistance Equipment used: None Transfers: Sit to/from Stand Sit to Stand: Min assist General transfer comment: Cues for forward weight shifting and extension into  upright standing.      Ambulation / Gait / Stairs / Wheelchair Mobility  Ambulation/Gait Ambulation/Gait assistance: Min assist, Mod assist Ambulation Distance (Feet): 100 Feet (x2 trials.  ) Assistive device: None (required increased assist due to balance without AD.  Performed without AD to challenge balance and allow patient to experience his true deficits based on PLOF.  ) Gait Pattern/deviations: Step-through pattern, Decreased weight shift to left, Trunk flexed, Antalgic General Gait Details: Performed without AD, constant cueing for weight shift L, R foot clearance, and forward gaze.  Pt frequently looking down due to poor propriception.   Gait velocity: decreased    Posture / Balance Balance Overall balance assessment: Needs assistance Sitting-balance support: Feet supported, No upper extremity supported Sitting balance-Leahy Scale: Fair Standing balance support: No upper extremity supported Standing balance-Leahy Scale: Poor Standing balance comment: physical assist needed for standing balance High Level Balance Comments: Performed Single limb stance with eyes open and B, and unilateral support.  Pt performed static stance with eyes open and closed.  LOB noted requiring assist to right and correct LOB.      Special needs/care consideration BiPAP/CPAP  N/a CPM  N/a Continuous Drip IV  N/a Dialysis  N/a Life Vest  N/a Oxygen  N/a Special Bed  N/a Trach Size  N/a Wound Vac (area)  N/a Skin intact Bowel mgmt: LBM 10/30/16 continent Bladder mgmt:continent Diabetic mgmt Hgb A1c 8.5; known DM pta HOH   Previous Home Environment Living Arrangements: Spouse/significant other  Lives With: Spouse Available Help at Discharge: Family, Available 24 hours/day Type of Home: House Home Layout: One level Home Access: Level entry Bathroom Shower/Tub: Multimedia programmer: Handicapped height Bathroom Accessibility: Yes How Accessible: Accessible via walker Home Care  Services: No Additional Comments: Pt unable to recall living situation, details provided by son.   Discharge Living Setting Plans for Discharge Living Setting: Patient's home, Lives with (comment) (spouse) Type of Home at Discharge: House Discharge Home Layout: One level Discharge Home Access: Level entry Discharge Bathroom Shower/Tub: Walk-in shower Discharge Bathroom Toilet: Handicapped height Discharge Bathroom Accessibility: Yes How Accessible: Accessible via walker Does the patient have any problems obtaining your medications?: No  Social/Family/Support Systems Patient Roles: Spouse, Parent Contact Information: Nellie, wife and son, [name] Anticipated Caregiver: wife and son, wife 49 yo  Anticipated Caregiver's Contact Information: see above Ability/Limitations of Caregiver: wife 88 yo and can provide supervision only; son lives next door Caregiver Availability: 24/7 Discharge Plan Discussed with Primary Caregiver: Yes Is Caregiver In Agreement with Plan?: Yes Does Caregiver/Family have Issues with Lodging/Transportation while Pt is in Rehab?: No  Goals/Additional Needs Patient/Family Goal for Rehab: supervision with PT, OT, and SLP Expected length of stay: ELOS 7-10 days Pt/Family Agrees to Admission and willing to participate: Yes Program Orientation Provided & Reviewed with Pt/Caregiver Including Roles  & Responsibilities: Yes  Decrease burden of Care through IP rehab  admission: n/a  Possible need for SNF placement upon discharge:not anticipated  Patient Condition: This patient's condition remains as documented in the consult dated 10/30/2016, in which the Rehabilitation Physician determined and documented that the patient's condition is appropriate for intensive rehabilitative care in an inpatient rehabilitation facility. Will admit to inpatient rehab today.  Preadmission Screen Completed By:  Cleatrice Burke, 10/30/2016 6:05  PM ______________________________________________________________________   Discussed status with Dr. Naaman Plummer on 10/30/2016 at 1806 and received telephone approval for admission today.  Admission Coordinator:  Cleatrice Burke, time [id number] Date 10/30/2016.

## 2016-10-30 NOTE — Progress Notes (Signed)
Physical Therapy Treatment Patient Details Name: Jonathan [name] MRN: [id number] DOB: 1929-05-29 Today's Date: 10/30/2016    History of Present Illness 83 y.o.malewith a past medical history significant for CAD s/p CABG in 2007, HTN, pAF no longer on Xarelto and diet controlled DMwho presents with altered mental status and right sided numbness. MRI on 1/17 + for acute left thalamus infarct.    PT Comments    Pt performed increased activity progressing to gait with out AD (mod A), Pt also performed standing therapeutic exercises, and balance activities.  Pt continue to benefit from rehab in a post acute setting to address deficits listed below.  Spoke with patient and family in depth about the intensity and benefits.  Pt remains to present with cognitive deficits which limit his ability to process at this time.    Follow Up Recommendations  CIR     Equipment Recommendations   (to be determined.  )    Recommendations for Other Services Rehab consult     Precautions / Restrictions Precautions Precautions: Fall Restrictions Weight Bearing Restrictions: No    Mobility  Bed Mobility Overal bed mobility: Needs Assistance Bed Mobility: Supine to Sit;Sit to Supine     Supine to sit: Supervision Sit to supine: Supervision   General bed mobility comments: Cues for LE position and advancement to improve ease.    Transfers Overall transfer level: Needs assistance Equipment used: None Transfers: Sit to/from Stand Sit to Stand: Min assist         General transfer comment: Cues for forward weight shifting and extension into upright standing.    Ambulation/Gait Ambulation/Gait assistance: Min assist;Mod assist Ambulation Distance (Feet): 100 Feet (x2 trials.  ) Assistive device: None (required increased assist due to balance without AD.  Performed without AD to challenge balance and allow patient to experience his true deficits based on PLOF.  ) Gait Pattern/deviations:  Step-through pattern;Decreased weight shift to left;Trunk flexed;Antalgic Gait velocity: decreased   General Gait Details: Performed without AD, constant cueing for weight shift L, R foot clearance, and forward gaze.  Pt frequently looking down due to poor propriception.     Stairs            Wheelchair Mobility    Modified Rankin (Stroke Patients Only) Modified Rankin (Stroke Patients Only) Pre-Morbid Rankin Score: No symptoms Modified Rankin: Moderately severe disability     Balance Overall balance assessment: Needs assistance   Sitting balance-Leahy Scale: Fair       Standing balance-Leahy Scale: Poor Standing balance comment: physical assist needed for standing balance               High Level Balance Comments: Performed Single limb stance with eyes open and B, and unilateral support.  Pt performed static stance with eyes open and closed.  LOB noted requiring assist to right and correct LOB.      Cognition Arousal/Alertness: Awake/alert Behavior During Therapy: Flat affect Overall Cognitive Status: Impaired/Different from baseline Area of Impairment: Orientation;Safety/judgement;Awareness;Following commands Orientation Level: Disoriented to;Situation;Time   Memory: Decreased short-term memory Following Commands: Follows one step commands inconsistently Safety/Judgement: Decreased awareness of safety;Decreased awareness of deficits Awareness: Intellectual Problem Solving: Slow processing;Requires verbal cues;Requires tactile cues General Comments: Pt reports he can walk fine and is unaware of his deficits at this time.      Exercises General Exercises - Lower Extremity Ankle Circles/Pumps: AROM;Both;10 reps;Standing Hip ABduction/ADduction: AROM;Right;10 reps;Standing Hip Flexion/Marching: AROM;Right;10 reps;Standing Mini-Sqauts: AROM;Both;10 reps;Standing    General Comments  Pertinent Vitals/Pain Pain Assessment: Faces Faces Pain Scale: No  hurt    Home Living   Living Arrangements: Spouse/significant other Available Help at Discharge: Family;Available 24 hours/day Type of Home: House         Additional Comments: Pt unable to recall living situation, details provided by son.     Prior Function            PT Goals (current goals can now be found in the care plan section) Acute Rehab PT Goals Patient Stated Goal: get back to being independent Potential to Achieve Goals: Good Progress towards PT goals: Progressing toward goals    Frequency    Min 4X/week      PT Plan Current plan remains appropriate    Co-evaluation             End of Session Equipment Utilized During Treatment: Gait belt Activity Tolerance: Patient tolerated treatment well Patient left: in bed;with call bell/phone within reach;with bed alarm set     Time: [id number] PT Time Calculation (min) (ACUTE ONLY): 28 min  Charges:  $Gait Training: 8-22 mins $Therapeutic Exercise: 8-22 mins                    G Codes:      Jonathan [name] November 15, 2016, 5:41 PM  Jonathan [name], PTA pager [phone]

## 2016-10-30 NOTE — Progress Notes (Signed)
CSW met with pt and son this morning to discuss SNF back up to CIR.  Pt did not speak during the interview but son was agreeable to looking into rehab options- possibly [facility] which is close to home if patient could not get into CIR.  CSW sent out referral and came back to provide offers to pt and pt son- CIR coordinator in the room and patient now speaking and adamant about returning home- CSW left SNF list at bedside but unable to get choice from pt and son at this time.  [name], LCSW Clinical Social Worker [phone]

## 2016-10-30 NOTE — Progress Notes (Signed)
I have insurance approval and will make the arrangements to admit pt tonight. Pt, daughter, son and wife at bedside and are in agreement after much discussion with pt. I have notified RN CM, SW, and Dr. Clementeen Graham. [phone]

## 2016-10-30 NOTE — Progress Notes (Signed)
I met with pt at bedside with his son to discuss a possible inpt rehab admission.Pt with cognitive deficits impacting the discussion/reasoning for an inpt rehab admission. Son gave me approval to pursue [facility] approval which I will initiate. I have dicussed with SW, [place]. I will follow up when insurance determination made. [phone]

## 2016-10-30 NOTE — NC FL2 (Signed)
[place] LEVEL OF CARE SCREENING TOOL     IDENTIFICATION  Patient Name: Jonathan [name] Birthdate: Mar 10, 1929 Sex: male Admission Date (Current Location): 10/28/2016  [facility] and [place] Number:  Herbalist and Address:  [facility], [place] [address] [place], [place] [address]      Provider Number: [id number]  Attending Physician Name and Address:  Louellen Molder, MD  Relative Name and Phone Number:       Current Level of Care: Hospital Recommended Level of Care: [place] Prior Approval Number:    Date Approved/Denied:   PASRR Number: [id number]  Discharge Plan: SNF    Current Diagnoses: Patient Active Problem List   Diagnosis Date Noted  . Cerebral thrombosis with cerebral infarction 10/29/2016  . Acute CVA (cerebrovascular accident) ([place]) 10/29/2016  . Confusion 10/28/2016  . Syncope 10/28/2016  . Long-term (current) use of anticoagulants 10/17/2015  . Typical atrial flutter ([place])   . Diabetes mellitus without complication ([place]) [id number]  . Paroxysmal atrial fibrillation ([facility])   . Coronary atherosclerosis of native coronary artery 07/14/2013  . Mixed hyperlipidemia 07/14/2013  . Hypertensive heart disease without CHF     Orientation RESPIRATION BLADDER Height & Weight     Self, Time, Situation, Place  Normal Continent Weight: 164 lb 14.4 oz (74.8 kg) Height:  5\' 11"  (180.3 cm)  BEHAVIORAL SYMPTOMS/MOOD NEUROLOGICAL BOWEL NUTRITION STATUS      Continent Diet (see DC summary)  AMBULATORY STATUS COMMUNICATION OF NEEDS Skin   Limited Assist Verbally Normal                       Personal Care Assistance Level of Assistance  Bathing, Dressing Bathing Assistance: Limited assistance   Dressing Assistance: Limited assistance     Functional Limitations Info  Speech     Speech Info: Impaired (aphasia from stroke)    [place]  PT (By licensed PT), OT (By licensed  OT)     PT Frequency: 5/wk OT Frequency: 5/wk            Contractures      Additional Factors Info  Code Status, Allergies Code Status Info: FULL Allergies Info: Amlodipine, Codeine, Metformin And Related, Statins, Welchol Colesevelam Hcl           Current Medications (10/30/2016):  This is the current hospital active medication list Current Facility-Administered Medications  Medication Dose Route Frequency Provider Last Rate Last Dose  . acetaminophen (TYLENOL) tablet 650 mg  650 mg Oral Q4H PRN Edwin Dada, MD       Or  . acetaminophen (TYLENOL) solution 650 mg  650 mg Per Tube Q4H PRN Edwin Dada, MD       Or  . acetaminophen (TYLENOL) suppository 650 mg  650 mg Rectal Q4H PRN Edwin Dada, MD      . apixaban (ELIQUIS) tablet 5 mg  5 mg Oral BID Nishant Dhungel, MD   5 mg at 10/30/16 0950  . aspirin EC tablet 81 mg  81 mg Oral Daily Nishant Dhungel, MD   81 mg at 10/30/16 0950  . feeding supplement (ENSURE ENLIVE) (ENSURE ENLIVE) liquid 237 mL  237 mL Oral BID BM Nishant Dhungel, MD   237 mL at 10/30/16 1000  . isosorbide mononitrate (IMDUR) 24 hr tablet 15 mg  15 mg Oral Daily Edwin Dada, MD   15 mg at 10/30/16 0950  . pravastatin (PRAVACHOL) tablet  20 mg  20 mg Oral q1800 Rosalin Hawking, MD   20 mg at 10/29/16 1800     Discharge Medications: Please see discharge summary for a list of discharge medications.  Relevant Imaging Results:  Relevant Lab Results:   Additional Information SS#: [id number]  [name], LCSW

## 2016-10-30 NOTE — Progress Notes (Signed)
PROGRESS NOTE                                                                                                                                                                                                             Patient Demographics:    Jonathan [name], is a 83 y.o. male, DOB - Jun 01, 1929, [id number]  Admit date - 10/28/2016   Admitting Physician Edwin Dada, MD  Outpatient Primary MD for the patient is Irven Shelling, MD  LOS - 1  Outpatient Specialists:  Cardiologist (Dr. Wynonia Lawman)  Chief Complaint  Patient presents with  . Altered Mental Status       Brief Narrative   83 year old male with history of CAD status post CABG in 2007, hypertension, paroxysmal A. fib (Xarelto after discussing with cardiologist one month back since patient had vague symptoms including GI upset and weakness), diet-controlled diabetes mellitus presented with slurred speech, confusion and right-sided weakness. Patient found to have acute left thalamic infarct.   Subjective:   Patient better oriented and conversant. Was refusing to go to CIR or SNF today.   Assessment  & Plan :   Principal problem Acute left thalamus infarct ([place]) Risk factors include hypertension, CAD, hyperlipidemia , uncontrolled DM and paroxysmal A. fib. 2d echo without cardiac source of emboli. CT angiogram without significant carotid stenosis. Stroke team following. PT recommends CIR. EEG negative for seizure activity. CHADS2 vasc score of 6 remain at 13.6% per year risk of stroke/TIA/time embolism. Discussed with patient and son at bedside who agree with trying different anticoagulant. Will start him on Eliquis  and see for any obvious side effects (seen possibly with Xarelto). If patient unable to tolerate eliquis, switch to aspirin and Plavix. Discussed plan with  stroke team who agree.  EEG negative for seizure activity.   LDL 126, Cannot  place him on statin due to allergy. A1c pending. Allow permissive hypertension.  CAD with history of CABG On full dose aspirin. If patient started on anticoagulation will reduce to baby aspirin. There are low 126. Will start on Lipitor. Patient had cardiac cath pain 09/2015 which showed severe three-vessel coronary artery disease and cardiology recommended optimal medical management.  Essential hypertension Allowing permissive blood pressure.   Paroxysmal A. fib This was diagnosed during his hospitalization in 09/2015. He was started on Xarelto at that time but  since patient complained to his cardiologist of vague GI discomfort and weakness and wishes not to continue anticoagulation he was taken off Xarelto about 3-4 weeks back. Given his higher CHADS2vasc with acute stroke, discussed and evaluation options with patient's son who agrees with starting him on  Eliquis.  Diabetes mellitus type 2, uncontrolled. Was on diet only. A1C of 8.5 . Needs medications upon discharge ( possibly oral hypoglycemics). Monitor on SSI.  Hyperlipidemia   LDL of 126. Cannot add statin due to listed allergy. As per son when he discussed this with his mother, she doesnot recall pt ever being on statin. So they are not sure if pt has allergy to statin.  He is going to confirm with her further.   Code Status : Full code  Family Communication  : Son at bedside  Disposition Plan  : PT recommends CIR.Patient refused earlier, now agrees to go for inpt rehab. Awaiting insurance auth.   Barriers For Discharge : Improving symptoms  Consults  :   Stroke CIR  Procedures  :  CT head MRI brain CT angiogram head and neck 2-D echo  EEG  DVT Prophylaxis  :  Systemic anticoagulation  Lab Results  Component Value Date   PLT 241 10/28/2016    Antibiotics  :    Anti-infectives    None        Objective:   Vitals:   10/30/16 0545 10/30/16 0633 10/30/16 0830 10/30/16 1400  BP: (!) 171/75 (!) 146/49 (!)  164/82 (!) 145/66  Pulse: 65 (!) 59 69 75  Resp: 16  20 18   Temp: 98.4 F (36.9 C)  97.5 F (36.4 C) 98.6 F (37 C)  TempSrc: Oral  Oral Oral  SpO2: 94%  96% 97%  Weight:      Height:        Wt Readings from Last 3 Encounters:  10/28/16 74.8 kg (164 lb 14.4 oz)  02/13/16 79.4 kg (175 lb)  10/01/15 79.2 kg (174 lb 9.7 oz)     Intake/Output Summary (Last 24 hours) at 10/30/16 1643 Last data filed at 10/30/16 1614  Gross per 24 hour  Intake              480 ml  Output             1300 ml  Net             -820 ml     Physical Exam  Gen: not in distress, better conversant and expressive aphasia better. HEENT: moist mucosa, supple neck Chest: clear b/l, no added sounds CVS: N S1&S2, no murmurs,  GI: soft, NT, ND,  Musculoskeletal: warm, no edema [id number] and oriented 2-3,  4+/5 in both right upper and lower extremity)    Data Review:    CBC  Recent Labs Lab 10/28/16 1754  WBC 8.6  HGB 12.9*  HCT 38.5*  PLT 241  MCV 79.2  MCH 26.5  MCHC 33.5  RDW 14.0  LYMPHSABS 1.3  MONOABS 0.5  EOSABS 0.2  BASOSABS 0.0    Chemistries   Recent Labs Lab 10/28/16 1754  NA 139  K 3.9  CL 104  CO2 27  GLUCOSE 133*  BUN 15  CREATININE 0.71  CALCIUM 9.2   ------------------------------------------------------------------------------------------------------------------  Recent Labs  10/29/16 0649  CHOL 183  HDL 25*  LDLCALC 126*  TRIG 161*  CHOLHDL 7.3    Lab Results  Component Value Date   HGBA1C 8.5 (H) 10/29/2016   ------------------------------------------------------------------------------------------------------------------  No results for input(s): TSH, T4TOTAL, T3FREE, THYROIDAB in the last 72 hours.  Invalid input(s): FREET3 ------------------------------------------------------------------------------------------------------------------ No results for input(s): VITAMINB12, FOLATE, FERRITIN, TIBC, IRON, RETICCTPCT in the last 72  hours.  Coagulation profile No results for input(s): INR, PROTIME in the last 168 hours.  No results for input(s): DDIMER in the last 72 hours.  Cardiac Enzymes No results for input(s): CKMB, TROPONINI, MYOGLOBIN in the last 168 hours.  Invalid input(s): CK ------------------------------------------------------------------------------------------------------------------    Component Value Date/Time   BNP 307.3 (H) 09/28/2015 1832    Inpatient Medications  Scheduled Meds: . apixaban  5 mg Oral BID  . aspirin EC  81 mg Oral Daily  . feeding supplement (ENSURE ENLIVE)  237 mL Oral BID BM  . isosorbide mononitrate  15 mg Oral Daily  . pravastatin  20 mg Oral q1800   Continuous Infusions: PRN Meds:.acetaminophen **OR** acetaminophen (TYLENOL) oral liquid 160 mg/5 mL **OR** acetaminophen  Micro Results No results found for this or any previous visit (from the past 240 hour(s)).  Radiology Reports Ct Angio Head W Or Wo Contrast  Addendum Date: 10/29/2016   ADDENDUM REPORT: 10/29/2016 02:03 ADDENDUM: These results were called by telephone at the time of interpretation on 10/29/2016 at 2:02 am to Dr. Loleta Books, who verbally acknowledged these results. Electronically Signed   By: Kristine Garbe M.D.   On: 10/29/2016 02:03   Result Date: 10/29/2016 CLINICAL DATA:  83 y/o  M; stroke patient with follow-up scan. EXAM: CT ANGIOGRAPHY HEAD AND NECK TECHNIQUE: Multidetector CT imaging of the head and neck was performed using the standard protocol during bolus administration of intravenous contrast. Multiplanar CT image reconstructions and MIPs were obtained to evaluate the vascular anatomy. Carotid stenosis measurements (when applicable) are obtained utilizing NASCET criteria, using the distal internal carotid diameter as the denominator. CONTRAST:  50 cc Isovue 370 COMPARISON:  10/28/2016 MRI of the head FINDINGS: CTA NECK FINDINGS Aortic arch: Severe aortic atherosclerosis with  calcification and shaggy fibrofatty plaque. Bovine aortic arch, normal variant. Common origin of left vertebral artery and left subclavian artery. No significant stenosis of great vessel origins. Right carotid system: No evidence of dissection, stenosis (50% or greater) or occlusion. Mild mixed plaque of the bifurcation. Left carotid system: No evidence of dissection, stenosis (50% or greater) or occlusion. Predominantly fibrofatty plaque of the bifurcation with mild 40% stenosis of proximal ICA. Vertebral arteries: Right vertebral origin mixed plaque with severe stenosis (series 8, image 140). No evidence of dissection or occlusion. Skeleton: Moderate cervical spondylosis with discogenic and facet degenerative changes greatest at the C5 through C7 levels. No high-grade bony canal stenosis or foraminal narrowing. Other neck: Lipoma centered in the right anterior neck at the level of larynx anterior to sternocleidomastoid muscle measuring 40 x 21 x 42 mm (AP x ML x CC) series 5, image 70 and series 8, image 100. Subcentimeter calcified thyroid nodules. Upper chest: Negative. Review of the MIP images confirms the above findings CTA HEAD FINDINGS Anterior circulation: Calcified plaque of bilateral cavernous and paraclinoid internal carotid artery is without high-grade stenosis. Irregularity of bilateral M1 segments with distal right M1 mild stenosis and no significant left-sided stenosis. Several short segments of mild stenosis of bilateral ACA and MCA. Posterior circulation: Irregularity of right V4 segment with mild stenosis. Calcified plaque of left V4 segment without significant stenosis. Mild narrowing of the upper basilar artery. Severe stenosis of proximal right AICA. Short segment of moderate stenosis of left P1 segment. Proximal right P1 occlusion (  series 7, image 101 and series 11, image 26). Venous sinuses: As permitted by contrast timing, patent. Anatomic variants: Small anterior communicating artery. No  posterior communicating artery identified, likely hypoplastic or absent. Delayed phase: No abnormal intracranial enhancement. Review of the MIP images confirms the above findings IMPRESSION: 1. Right posterior cerebral artery P1 occlusion. 2. Otherwise no evidence for large vessel occlusion, aneurysm, or vascular malformation of circle of Willis. 3. Extensive intracranial atherosclerosis with multiple segments of stenosis in the anterior and posterior circulation. 4. Severe aortic atherosclerosis with calcified and shaggy fibrofatty plaque. 5. Mild 40% stenosis of proximal left ICA. 6. 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 7. Otherwise carotid and vertebral arteries of the neck are patent without evidence of occlusion, aneurysm, dissection, or high-grade stenosis. 8. 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 Electronically Signed: By: Kristine Garbe M.D. On: 10/29/2016 01:53   Dg Chest 2 View  Result Date: 10/28/2016 CLINICAL DATA:  Altered mental status EXAM: CHEST  2 VIEW COMPARISON:  10/02/2015 FINDINGS: AP and lateral views of the chest show low volumes. The cardio pericardial silhouette is enlarged. There is pulmonary vascular congestion without overt pulmonary edema. Patient is status post CABG. IMPRESSION: No active cardiopulmonary disease. Electronically Signed   By: Misty Stanley M.D.   On: 10/28/2016 18:34   Ct Head Wo Contrast  Result Date: 10/28/2016 CLINICAL DATA:  Altered mental status possible fall EXAM: CT HEAD WITHOUT CONTRAST TECHNIQUE: Contiguous axial images were obtained from the base of the skull through the vertex without intravenous contrast. COMPARISON:  MRI 02/21/2016, CT 08/07/2015 FINDINGS: Brain: No acute territorial infarction, intracranial hemorrhage or extra-axial fluid collection is seen. No focal mass, mass effect or midline shift. Mild to moderate periventricular white matter hypodensity consistent with small vessel disease. Old left thalamic  lacunar infarcts as before. Old left cerebellar infarct as before. Ventricles are similar in size compared to the prior study. There is mild atrophy. Vascular: No hyperdense vessels.  Carotid artery calcifications. Skull: Mastoid air cells clear.  No skull fracture Sinuses/Orbits: Mucosal thickening in the maxillary ethmoid and sphenoid sinuses. No acute orbital abnormality. Bilateral lens extraction. Other: None IMPRESSION: No definite CT evidence for acute intracranial abnormality Electronically Signed   By: Donavan Foil M.D.   On: 10/28/2016 18:49   Ct Angio Neck W Or Wo Contrast  Addendum Date: 10/29/2016   ADDENDUM REPORT: 10/29/2016 02:03 ADDENDUM: These results were called by telephone at the time of interpretation on 10/29/2016 at 2:02 am to Dr. Loleta Books, who verbally acknowledged these results. Electronically Signed   By: Kristine Garbe M.D.   On: 10/29/2016 02:03   Result Date: 10/29/2016 CLINICAL DATA:  83 y/o  M; stroke patient with follow-up scan. EXAM: CT ANGIOGRAPHY HEAD AND NECK TECHNIQUE: Multidetector CT imaging of the head and neck was performed using the standard protocol during bolus administration of intravenous contrast. Multiplanar CT image reconstructions and MIPs were obtained to evaluate the vascular anatomy. Carotid stenosis measurements (when applicable) are obtained utilizing NASCET criteria, using the distal internal carotid diameter as the denominator. CONTRAST:  50 cc Isovue 370 COMPARISON:  10/28/2016 MRI of the head FINDINGS: CTA NECK FINDINGS Aortic arch: Severe aortic atherosclerosis with calcification and shaggy fibrofatty plaque. Bovine aortic arch, normal variant. Common origin of left vertebral artery and left subclavian artery. No significant stenosis of great vessel origins. Right carotid system: No evidence of dissection, stenosis (50% or greater) or occlusion. Mild mixed plaque of the bifurcation. Left carotid system:  No evidence of dissection, stenosis  (50% or greater) or occlusion. Predominantly fibrofatty plaque of the bifurcation with mild 40% stenosis of proximal ICA. Vertebral arteries: Right vertebral origin mixed plaque with severe stenosis (series 8, image 140). No evidence of dissection or occlusion. Skeleton: Moderate cervical spondylosis with discogenic and facet degenerative changes greatest at the C5 through C7 levels. No high-grade bony canal stenosis or foraminal narrowing. Other neck: Lipoma centered in the right anterior neck at the level of larynx anterior to sternocleidomastoid muscle measuring 40 x 21 x 42 mm (AP x ML x CC) series 5, image 70 and series 8, image 100. Subcentimeter calcified thyroid nodules. Upper chest: Negative. Review of the MIP images confirms the above findings CTA HEAD FINDINGS Anterior circulation: Calcified plaque of bilateral cavernous and paraclinoid internal carotid artery is without high-grade stenosis. Irregularity of bilateral M1 segments with distal right M1 mild stenosis and no significant left-sided stenosis. Several short segments of mild stenosis of bilateral ACA and MCA. Posterior circulation: Irregularity of right V4 segment with mild stenosis. Calcified plaque of left V4 segment without significant stenosis. Mild narrowing of the upper basilar artery. Severe stenosis of proximal right AICA. Short segment of moderate stenosis of left P1 segment. Proximal right P1 occlusion (series 7, image 101 and series 11, image 26). Venous sinuses: As permitted by contrast timing, patent. Anatomic variants: Small anterior communicating artery. No posterior communicating artery identified, likely hypoplastic or absent. Delayed phase: No abnormal intracranial enhancement. Review of the MIP images confirms the above findings IMPRESSION: 1. Right posterior cerebral artery P1 occlusion. 2. Otherwise no evidence for large vessel occlusion, aneurysm, or vascular malformation of circle of Willis. 3. Extensive intracranial  atherosclerosis with multiple segments of stenosis in the anterior and posterior circulation. 4. Severe aortic atherosclerosis with calcified and shaggy fibrofatty plaque. 5. Mild 40% stenosis of proximal left ICA. 6. 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 7. Otherwise carotid and vertebral arteries of the neck are patent without evidence of occlusion, aneurysm, dissection, or high-grade stenosis. 8. 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 Electronically Signed: By: Kristine Garbe M.D. On: 10/29/2016 01:53   Mr Brain Wo Contrast  Result Date: 10/28/2016 CLINICAL DATA:  [place] while shoveling snow. Confusion, change in speech. History of hypertension, hyperlipidemia. EXAM: MRI HEAD WITHOUT CONTRAST TECHNIQUE: Multiplanar, multiecho pulse sequences of the brain and surrounding structures were obtained without intravenous contrast. COMPARISON:  CT HEAD October 28, 2016 and MRI head Feb 21, 2016 FINDINGS: BRAIN: 15 x 11 mm focus of reduced diffusion mesial LEFT thalamus with low ADC values. 3 mm T2 hyperintensity LEFT thalamus most consistent with perivascular space, present on prior MRI. No susceptibility artifact to suggest hemorrhage. Ventricles and sulci are normal for patient's age. Old small LEFT cerebellar infarct. Patchy supratentorial white matter FLAIR T2 hyperintensities. No abnormal extra-axial fluid collections. VASCULAR: Similar probable slow flow RIGHT vertebral artery, major intracranial vascular flow voids otherwise maintained. SKULL AND UPPER CERVICAL SPINE: No abnormal sellar expansion. No suspicious calvarial bone marrow signal. Craniocervical junction maintained. SINUSES/ORBITS: Trace paranasal sinus mucosal thickening. Mastoid air cells are well aerated. Status post bilateral ocular lens implants. The included ocular globes and orbital contents are non-suspicious. OTHER: None. IMPRESSION: Acute 15 x 11 mm LEFT thalamus infarct. Otherwise stable appearance of the brain  including old small LEFT cerebellar infarct and mild to moderate chronic small vessel ischemic disease. Electronically Signed   By: Elon Alas M.D.   On: 10/28/2016 21:28    Time  Spent in minutes  25   Louellen Molder M.D on 10/30/2016 at 4:43 PM  Between 7am to 7pm - Pager - [phone]  After 7pm go to [email or web address] - password [facility]  [facility] -  Office  [phone]

## 2016-10-30 NOTE — Progress Notes (Signed)
Skin assessment done. Scattered scabs on right ankle and  right knee. Old incision scar to mid chest. Redness to chest from Tele leads and from them shaving him. Skin assessed with Di Kindle, RN.

## 2016-10-30 NOTE — Progress Notes (Addendum)
Attempted report RN stacey stated she would call back.    6:57 PM Report given to Erline Levine , [place]

## 2016-10-30 NOTE — H&P (Signed)
Physical Medicine and Rehabilitation Admission H&P    Chief Complaint  Patient presents with  . Altered Mental Status  : HPI: Jonathan [name] is a 83 y.o. right handed male with history of CAD status post CABG 2007, hypertension, PAF on Xarelto in the past. Presented 10/28/2016 with altered mental status right-sided numbness and slurred speech. Per chart review patient lives with her 24 year old wife. Reported to be independent prior to admission still driving. Son lives next door and works during the day. MRI imaging revealed acute 15 x 11 mm left thalamus infarct. CT angiogram head and neck showed right posterior cerebral artery P1 occlusion. Atherosclerotic type changes. 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 Patient did not receive TPA. Echocardiogram with ejection fraction of 88% grade 1 diastolic dysfunction. EEG suggests possibility of mild encephalopathy. Negative for seizure.. Neurology follow-up currently on aspirin and Eliquis for CVA prophylaxis.  Tolerating a regular consistency diet. Physical therapy evaluation completed 10/29/2016 with recommendations of physical medicine rehabilitation consult.Patient was admitted for a comprehensive rehabilitation program  Review of Systems  Constitutional: Negative for chills and fever.  HENT: Positive for hearing loss.   Eyes: Negative for blurred vision and double vision.  Respiratory: Negative for cough and shortness of breath.   Cardiovascular: Positive for palpitations and leg swelling.  Gastrointestinal: Positive for constipation. Negative for nausea and vomiting.  Genitourinary: Positive for frequency. Negative for hematuria and urgency.  Musculoskeletal: Positive for falls, joint pain and myalgias.  Skin: Negative for rash.  Neurological: Positive for speech change and weakness.  Psychiatric/Behavioral: Positive for memory loss.  All other systems reviewed and are negative.  Past Medical History:  Diagnosis Date  .  Allergic rhinitis   . BPH (benign prostatic hypertrophy)   . Coronary atherosclerosis of native coronary artery   . Degeneration of lumbar or lumbosacral intervertebral disc   . Diabetes ([place])    no meds  . Dyslipidemia   . HOH (hard of hearing)   . Hyperlipidemia   . Hypertension   . Right leg weakness   . S/P CABG x 4 2006   LIMA-LAD, SVG-rPDA, sSVG-OM1-OM2  . Stroke ([place])   . Wears glasses    reading   Past Surgical History:  Procedure Laterality Date  . bil carpal tunnel surgery    . CARDIAC CATHETERIZATION N/A 10/01/2015   Procedure: Left Heart Cath and Cors/Grafts Angiography;  Surgeon: Peter M Martinique, MD;  Location: [facility] [place] [facility];  Service: Cardiovascular;  Laterality: N/A;  . COLONOSCOPY    . CORONARY ARTERY BYPASS GRAFT  2006    Coronary artery bypass grafting x 4 (left internal mammary  . DUPUYTREN CONTRACTURE RELEASE Left 04/05/2014   Procedure: EXCISION DUPUYTREN LEFT PALM, RING, SMALL, AND THUMB;  Surgeon: Cammie Sickle, MD;  Location: [facility];  Service: Orthopedics;  Laterality: Left;  . EYE SURGERY     both cataracts  . HEMORRHOID SURGERY    . LAMINECTOMY    . ROTATOR CUFF REPAIR    . TONSILLECTOMY    . TRANSURETHRAL RESECTION OF PROSTATE     Family History  Problem Relation Age of Onset  . Hypertension Father    Social History:  reports that he has quit smoking. He has never used smokeless tobacco. He reports that he drinks alcohol. He reports that he does not use drugs. Allergies:  Allergies  Allergen Reactions  . Amlodipine Other (See Comments)    Fatigue   . Codeine  unknown  . Metformin And Related Other (See Comments)    fatigue  . Statins     "pass out, dizzy, drunk feeling, poor balance"  . Welchol [Colesevelam Hcl]    Medications Prior to Admission  Medication Sig Dispense Refill  . isosorbide mononitrate (IMDUR) 30 MG 24 hr tablet Take 1 tablet (30 mg total) by mouth daily. (Patient taking differently:  Take 15 mg by mouth daily. ) 30 tablet 1    Home: [place] expects to be discharged to:: Private residence Living Arrangements: Spouse/significant other (pt unable to recall, detail provided by son) Available Help at Discharge: Family, Available 24 hours/day Type of Home: House Home Access: Level entry Home Layout: One level Bathroom Shower/Tub: Multimedia programmer: Handicapped height Home Equipment: Shower seat Additional Comments: Pt unable to recall living situation, details provided by son.    Functional History: Prior Function Level of Independence: Independent Comments: enjoys Dealer work  Functional Status:  Mobility: Bed Mobility Overal bed mobility: Needs Assistance Bed Mobility: Sit to Supine Supine to sit: Min guard Sit to supine: Min assist General bed mobility comments: Pt using rail for assist Transfers Overall transfer level: Needs assistance Equipment used: Rolling walker (2 wheeled), None Transfers: Sit to/from Stand Sit to Stand: Min assist General transfer comment: repeated cues for safety needed, pt not consistenlty following commands Ambulation/Gait Ambulation/Gait assistance: Museum/gallery curator (Feet): 110 Feet Assistive device: Rolling walker (2 wheeled) Gait Pattern/deviations: Step-through pattern, Decreased step length - right, Decreased step length - left, Trunk flexed General Gait Details: Pt having difficulty with Rt UE grip of rw, poor safety awareness using rw, assist needed to avoid objects in room.  Gait velocity: decreased    ADL: ADL Overall ADL's : Needs assistance/impaired Eating/Feeding: Set up, Sitting Eating/Feeding Details (indicate cue type and reason): Son reports pt having difficulty self feeding with use of R hand but able to complete with increased time. Grooming: Min guard, Sitting Upper Body Bathing: Min guard, Sitting Lower Body Bathing: Sit to/from stand Upper Body Dressing  : Min guard, Sitting Lower Body Dressing: Sit to/from stand, Moderate assistance Lower Body Dressing Details (indicate cue type and reason): Min assist to don socks sitting EOB Toilet Transfer: Minimal assistance, Ambulation, BSC, RW, Cueing for safety, Cueing for sequencing Toilet Transfer Details (indicate cue type and reason): Simulated by sit to stand from EOB with short distance functional mobility in room. Functional mobility during ADLs: Minimal assistance, Rolling walker, Cueing for safety, Cueing for sequencing General ADL Comments: Discussed post acute rehab with pt and son; they are agreeable.  Cognition: Cognition Overall Cognitive Status: Impaired/Different from baseline Orientation Level: Oriented to person, Oriented to place, Oriented to time, Oriented to situation Cognition Arousal/Alertness: Awake/alert Behavior During Therapy: Flat affect Overall Cognitive Status: Impaired/Different from baseline Area of Impairment: Orientation, Following commands, Safety/judgement, Awareness Orientation Level: Disoriented to, Person, Time, Situation Memory: Decreased short-term memory Following Commands: Follows one step commands inconsistently Safety/Judgement: Decreased awareness of safety, Decreased awareness of deficits Awareness: Intellectual Problem Solving: Slow processing, Requires verbal cues, Requires tactile cues General Comments: Pt unable to identify "cactus" "hammock" on NIH picture sheet. Difficulty with DOB. Cues for sequencing and initiation.  Physical Exam: Blood pressure (!) 164/82, pulse 69, temperature 97.5 F (36.4 C), temperature source Oral, resp. rate 20, height _0  (1.803 m), weight 74.8 kg (164 lb 14.4 oz), SpO2 96 %. Physical Exam  Constitutional: He appears well-developed and well-nourished.  HENT:  Head: Normocephalic and atraumatic.  Eyes: EOM are normal. Left eye exhibits no discharge.  Neck: Normal range of motion. Neck supple. No JVD present. No  tracheal deviation present. Thyromegaly present.  Cardiovascular: Exam reveals no gallop and no friction rub.   No murmur heard. Cardiac rate control  Respiratory: Effort normal and breath sounds normal. No respiratory distress. He has no wheezes. He has no rales.  GI: Soft. Bowel sounds are normal. He exhibits no distension. There is no tenderness. There is no rebound.  Musculoskeletal: He exhibits no edema.  Skin warm and dry Neurological: He is alert and oriented to hospital. Could not tell me the date. Provided home address. Follows simple commands but continued delays in processing. Decreased awareness. Speech is slurred. Displays mild expressive language deficits also. Motor 4+/5 RUE and 4 to 4+/5 RLE. LUE and LLE 4+ to 5/5. Sensation diminished trace amount RUE, RLE. +pronator drift Psych: pleasant and appropriate  Results for orders placed or performed during the hospital encounter of 10/28/16 (from the past 48 hour(s))  CBC with Differential     Status: Abnormal   Collection Time: 10/28/16  5:54 PM  Result Value Ref Range   WBC 8.6 4.0 - 10.5 K/uL   RBC 4.86 4.22 - 5.81 MIL/uL   Hemoglobin 12.9 (L) 13.0 - 17.0 g/dL   HCT 38.5 (L) 39.0 - 52.0 %   MCV 79.2 78.0 - 100.0 fL   MCH 26.5 26.0 - 34.0 pg   MCHC 33.5 30.0 - 36.0 g/dL   RDW 14.0 11.5 - 15.5 %   Platelets 241 150 - 400 K/uL   Neutrophils Relative % 76 %   Neutro Abs 6.6 1.7 - 7.7 K/uL   Lymphocytes Relative 15 %   Lymphs Abs 1.3 0.7 - 4.0 K/uL   Monocytes Relative 6 %   Monocytes Absolute 0.5 0.1 - 1.0 K/uL   Eosinophils Relative 2 %   Eosinophils Absolute 0.2 0.0 - 0.7 K/uL   Basophils Relative 1 %   Basophils Absolute 0.0 0.0 - 0.1 K/uL  Basic metabolic panel     Status: Abnormal   Collection Time: 10/28/16  5:54 PM  Result Value Ref Range   Sodium 139 135 - 145 mmol/L   Potassium 3.9 3.5 - 5.1 mmol/L   Chloride 104 101 - 111 mmol/L   CO2 27 22 - 32 mmol/L   Glucose, Bld 133 (H) 65 - 99 mg/dL   BUN 15 6 - 20  mg/dL   Creatinine, Ser 0.71 0.61 - 1.24 mg/dL   Calcium 9.2 8.9 - 10.3 mg/dL   GFR calc non Af Amer >60 >60 mL/min   GFR calc Af Amer >60 >60 mL/min    Comment: (NOTE) The eGFR has been calculated using the CKD EPI equation. This calculation has not been validated in all clinical situations. eGFR's persistently <60 mL/min signify possible Chronic Kidney Disease.    Anion gap 8 5 - 15  I-stat troponin, ED     Status: None   Collection Time: 10/28/16  6:21 PM  Result Value Ref Range   Troponin i, poc 0.00 0.00 - 0.08 ng/mL   Comment 3            Comment: Due to the release kinetics of cTnI, a negative result within the first hours of the onset of symptoms does not rule out myocardial infarction with certainty. If myocardial infarction is still suspected, repeat the test at appropriate intervals.   Urinalysis, Routine w reflex microscopic     Status:  None   Collection Time: 10/28/16  7:00 PM  Result Value Ref Range   Color, Urine YELLOW YELLOW   APPearance CLEAR CLEAR   Specific Gravity, Urine 1.014 1.005 - 1.030   pH 7.0 5.0 - 8.0   Glucose, UA NEGATIVE NEGATIVE mg/dL   Hgb urine dipstick NEGATIVE NEGATIVE   Bilirubin Urine NEGATIVE NEGATIVE   Ketones, ur NEGATIVE NEGATIVE mg/dL   Protein, ur NEGATIVE NEGATIVE mg/dL   Nitrite NEGATIVE NEGATIVE   Leukocytes, UA NEGATIVE NEGATIVE  .Cooxemetry Panel (carboxy, met, total hgb, O2 sat)     Status: None   Collection Time: 10/28/16  8:25 PM  Result Value Ref Range   Total hemoglobin 12.3 12.0 - 16.0 g/dL   O2 Saturation 95.3 %   Carboxyhemoglobin 1.1 0.5 - 1.5 %   Methemoglobin 1.0 0.0 - 1.5 %  Lipid panel     Status: Abnormal   Collection Time: 10/29/16  6:49 AM  Result Value Ref Range   Cholesterol 183 0 - 200 mg/dL   Triglycerides 161 (H) <150 mg/dL   HDL 25 (L) >40 mg/dL   Total CHOL/HDL Ratio 7.3 RATIO   VLDL 32 0 - 40 mg/dL   LDL Cholesterol 126 (H) 0 - 99 mg/dL    Comment:        Total Cholesterol/HDL:CHD  Risk Coronary Heart Disease Risk Table                     Men   Women  1/2 Average Risk   3.4   3.3  Average Risk       5.0   4.4  2 X Average Risk   9.6   7.1  3 X Average Risk  23.4   11.0        Use the calculated Patient Ratio above and the CHD Risk Table to determine the patient's CHD Risk.        ATP III CLASSIFICATION (LDL):  <100     mg/dL   Optimal  100-129  mg/dL   Near or Above                    Optimal  130-159  mg/dL   Borderline  160-189  mg/dL   High  >190     mg/dL   Very High   Hemoglobin A1c     Status: Abnormal   Collection Time: 10/29/16  6:49 AM  Result Value Ref Range   Hgb A1c MFr Bld 8.5 (H) 4.8 - 5.6 %    Comment: (NOTE)         Pre-diabetes: 5.7 - 6.4         Diabetes: >6.4         Glycemic control for adults with diabetes: <7.0    Mean Plasma Glucose 197 mg/dL    Comment: (NOTE) Performed At: [facility] [place], [place] [id number] Lindon Romp MD [id number]    Ct Angio Head W Or Wo Contrast  Addendum Date: 10/29/2016   ADDENDUM REPORT: 10/29/2016 02:03 ADDENDUM: These results were called by telephone at the time of interpretation on 10/29/2016 at 2:02 am to Dr. Loleta Books, who verbally acknowledged these results. Electronically Signed   By: Kristine Garbe M.D.   On: 10/29/2016 02:03   Result Date: 10/29/2016 CLINICAL DATA:  83 y/o  M; stroke patient with follow-up scan. EXAM: CT ANGIOGRAPHY HEAD AND NECK TECHNIQUE: Multidetector CT imaging of the head and neck was performed  using the standard protocol during bolus administration of intravenous contrast. Multiplanar CT image reconstructions and MIPs were obtained to evaluate the vascular anatomy. Carotid stenosis measurements (when applicable) are obtained utilizing NASCET criteria, using the distal internal carotid diameter as the denominator. CONTRAST:  50 cc Isovue 370 COMPARISON:  10/28/2016 MRI of the head FINDINGS: CTA NECK FINDINGS Aortic arch: Severe  aortic atherosclerosis with calcification and shaggy fibrofatty plaque. Bovine aortic arch, normal variant. Common origin of left vertebral artery and left subclavian artery. No significant stenosis of great vessel origins. Right carotid system: No evidence of dissection, stenosis (50% or greater) or occlusion. Mild mixed plaque of the bifurcation. Left carotid system: No evidence of dissection, stenosis (50% or greater) or occlusion. Predominantly fibrofatty plaque of the bifurcation with mild 40% stenosis of proximal ICA. Vertebral arteries: Right vertebral origin mixed plaque with severe stenosis (series 8, image 140). No evidence of dissection or occlusion. Skeleton: Moderate cervical spondylosis with discogenic and facet degenerative changes greatest at the C5 through C7 levels. No high-grade bony canal stenosis or foraminal narrowing. Other neck: Lipoma centered in the right anterior neck at the level of larynx anterior to sternocleidomastoid muscle measuring 40 x 21 x 42 mm (AP x ML x CC) series 5, image 70 and series 8, image 100. Subcentimeter calcified thyroid nodules. Upper chest: Negative. Review of the MIP images confirms the above findings CTA HEAD FINDINGS Anterior circulation: Calcified plaque of bilateral cavernous and paraclinoid internal carotid artery is without high-grade stenosis. Irregularity of bilateral M1 segments with distal right M1 mild stenosis and no significant left-sided stenosis. Several short segments of mild stenosis of bilateral ACA and MCA. Posterior circulation: Irregularity of right V4 segment with mild stenosis. Calcified plaque of left V4 segment without significant stenosis. Mild narrowing of the upper basilar artery. Severe stenosis of proximal right AICA. Short segment of moderate stenosis of left P1 segment. Proximal right P1 occlusion (series 7, image 101 and series 11, image 26). Venous sinuses: As permitted by contrast timing, patent. Anatomic variants: Small anterior  communicating artery. No posterior communicating artery identified, likely hypoplastic or absent. Delayed phase: No abnormal intracranial enhancement. Review of the MIP images confirms the above findings IMPRESSION: 1. Right posterior cerebral artery P1 occlusion. 2. Otherwise no evidence for large vessel occlusion, aneurysm, or vascular malformation of circle of Willis. 3. Extensive intracranial atherosclerosis with multiple segments of stenosis in the anterior and posterior circulation. 4. Severe aortic atherosclerosis with calcified and shaggy fibrofatty plaque. 5. Mild 40% stenosis of proximal left ICA. 6. 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 7. Otherwise carotid and vertebral arteries of the neck are patent without evidence of occlusion, aneurysm, dissection, or high-grade stenosis. 8. 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 Electronically Signed: By: Kristine Garbe M.D. On: 10/29/2016 01:53   Dg Chest 2 View  Result Date: 10/28/2016 CLINICAL DATA:  Altered mental status EXAM: CHEST  2 VIEW COMPARISON:  10/02/2015 FINDINGS: AP and lateral views of the chest show low volumes. The cardio pericardial silhouette is enlarged. There is pulmonary vascular congestion without overt pulmonary edema. Patient is status post CABG. IMPRESSION: No active cardiopulmonary disease. Electronically Signed   By: Misty Stanley M.D.   On: 10/28/2016 18:34   Ct Head Wo Contrast  Result Date: 10/28/2016 CLINICAL DATA:  Altered mental status possible fall EXAM: CT HEAD WITHOUT CONTRAST TECHNIQUE: Contiguous axial images were obtained from the base of the skull through the vertex without intravenous contrast. COMPARISON:  MRI 02/21/2016,  CT 08/07/2015 FINDINGS: Brain: No acute territorial infarction, intracranial hemorrhage or extra-axial fluid collection is seen. No focal mass, mass effect or midline shift. Mild to moderate periventricular white matter hypodensity consistent with small vessel  disease. Old left thalamic lacunar infarcts as before. Old left cerebellar infarct as before. Ventricles are similar in size compared to the prior study. There is mild atrophy. Vascular: No hyperdense vessels.  Carotid artery calcifications. Skull: Mastoid air cells clear.  No skull fracture Sinuses/Orbits: Mucosal thickening in the maxillary ethmoid and sphenoid sinuses. No acute orbital abnormality. Bilateral lens extraction. Other: None IMPRESSION: No definite CT evidence for acute intracranial abnormality Electronically Signed   By: Donavan Foil M.D.   On: 10/28/2016 18:49   Ct Angio Neck W Or Wo Contrast  Addendum Date: 10/29/2016   ADDENDUM REPORT: 10/29/2016 02:03 ADDENDUM: These results were called by telephone at the time of interpretation on 10/29/2016 at 2:02 am to Dr. Loleta Books, who verbally acknowledged these results. Electronically Signed   By: Kristine Garbe M.D.   On: 10/29/2016 02:03   Result Date: 10/29/2016 CLINICAL DATA:  82 y/o  M; stroke patient with follow-up scan. EXAM: CT ANGIOGRAPHY HEAD AND NECK TECHNIQUE: Multidetector CT imaging of the head and neck was performed using the standard protocol during bolus administration of intravenous contrast. Multiplanar CT image reconstructions and MIPs were obtained to evaluate the vascular anatomy. Carotid stenosis measurements (when applicable) are obtained utilizing NASCET criteria, using the distal internal carotid diameter as the denominator. CONTRAST:  50 cc Isovue 370 COMPARISON:  10/28/2016 MRI of the head FINDINGS: CTA NECK FINDINGS Aortic arch: Severe aortic atherosclerosis with calcification and shaggy fibrofatty plaque. Bovine aortic arch, normal variant. Common origin of left vertebral artery and left subclavian artery. No significant stenosis of great vessel origins. Right carotid system: No evidence of dissection, stenosis (50% or greater) or occlusion. Mild mixed plaque of the bifurcation. Left carotid system: No evidence  of dissection, stenosis (50% or greater) or occlusion. Predominantly fibrofatty plaque of the bifurcation with mild 40% stenosis of proximal ICA. Vertebral arteries: Right vertebral origin mixed plaque with severe stenosis (series 8, image 140). No evidence of dissection or occlusion. Skeleton: Moderate cervical spondylosis with discogenic and facet degenerative changes greatest at the C5 through C7 levels. No high-grade bony canal stenosis or foraminal narrowing. Other neck: Lipoma centered in the right anterior neck at the level of larynx anterior to sternocleidomastoid muscle measuring 40 x 21 x 42 mm (AP x ML x CC) series 5, image 70 and series 8, image 100. Subcentimeter calcified thyroid nodules. Upper chest: Negative. Review of the MIP images confirms the above findings CTA HEAD FINDINGS Anterior circulation: Calcified plaque of bilateral cavernous and paraclinoid internal carotid artery is without high-grade stenosis. Irregularity of bilateral M1 segments with distal right M1 mild stenosis and no significant left-sided stenosis. Several short segments of mild stenosis of bilateral ACA and MCA. Posterior circulation: Irregularity of right V4 segment with mild stenosis. Calcified plaque of left V4 segment without significant stenosis. Mild narrowing of the upper basilar artery. Severe stenosis of proximal right AICA. Short segment of moderate stenosis of left P1 segment. Proximal right P1 occlusion (series 7, image 101 and series 11, image 26). Venous sinuses: As permitted by contrast timing, patent. Anatomic variants: Small anterior communicating artery. No posterior communicating artery identified, likely hypoplastic or absent. Delayed phase: No abnormal intracranial enhancement. Review of the MIP images confirms the above findings IMPRESSION: 1. Right posterior cerebral artery P1 occlusion. 2.  Otherwise no evidence for large vessel occlusion, aneurysm, or vascular malformation of circle of Willis. 3.  Extensive intracranial atherosclerosis with multiple segments of stenosis in the anterior and posterior circulation. 4. Severe aortic atherosclerosis with calcified and shaggy fibrofatty plaque. 5. Mild 40% stenosis of proximal left ICA. 6. 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 7. Otherwise carotid and vertebral arteries of the neck are patent without evidence of occlusion, aneurysm, dissection, or high-grade stenosis. 8. 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 Electronically Signed: By: Kristine Garbe M.D. On: 10/29/2016 01:53   Mr Brain Wo Contrast  Result Date: 10/28/2016 CLINICAL DATA:  [place] while shoveling snow. Confusion, change in speech. History of hypertension, hyperlipidemia. EXAM: MRI HEAD WITHOUT CONTRAST TECHNIQUE: Multiplanar, multiecho pulse sequences of the brain and surrounding structures were obtained without intravenous contrast. COMPARISON:  CT HEAD October 28, 2016 and MRI head Feb 21, 2016 FINDINGS: BRAIN: 15 x 11 mm focus of reduced diffusion mesial LEFT thalamus with low ADC values. 3 mm T2 hyperintensity LEFT thalamus most consistent with perivascular space, present on prior MRI. No susceptibility artifact to suggest hemorrhage. Ventricles and sulci are normal for patient's age. Old small LEFT cerebellar infarct. Patchy supratentorial white matter FLAIR T2 hyperintensities. No abnormal extra-axial fluid collections. VASCULAR: Similar probable slow flow RIGHT vertebral artery, major intracranial vascular flow voids otherwise maintained. SKULL AND UPPER CERVICAL SPINE: No abnormal sellar expansion. No suspicious calvarial bone marrow signal. Craniocervical junction maintained. SINUSES/ORBITS: Trace paranasal sinus mucosal thickening. Mastoid air cells are well aerated. Status post bilateral ocular lens implants. The included ocular globes and orbital contents are non-suspicious. OTHER: None. IMPRESSION: Acute 15 x 11 mm LEFT thalamus infarct. Otherwise stable  appearance of the brain including old small LEFT cerebellar infarct and mild to moderate chronic small vessel ischemic disease. Electronically Signed   By: Elon Alas M.D.   On: 10/28/2016 21:28       Medical Problem List and Plan: 1. Right hemisensory deficits/cognitive deficits  secondary to left thalamic infarct  -admit to inpatient rehab 2.  DVT Prophylaxis/Anticoagulation: Eliquis. 3. Pain Management: Tylenol as needed 4. Mood: Provide emotional support 5. Neuropsych: This patient is capable of making decisions on his own behalf. 6. Skin/Wound Care: Routine skin checks 7. Fluids/Electrolytes/Nutrition: Routine I&O with follow-up chemistries upon admission 8. CAD status post CABG. Continue aspirin 9. PAF. Maintain on Eliquis. Continue Imdur 15 mg daily 10. Hyperlipidemia. Pravachol   Post Admission Physician Evaluation: 1. Functional deficits secondary  to left thalamic infarct. 2. Patient is admitted to receive collaborative, interdisciplinary care between the physiatrist, rehab nursing staff, and therapy team. 3. Patient's level of medical complexity and substantial therapy needs in context of that medical necessity cannot be provided at a lesser intensity of care such as a SNF. 4. Patient has experienced substantial functional loss from his/her baseline which was documented above under the "Functional History" and "Functional Status" headings.  Judging by the patient's diagnosis, physical exam, and functional history, the patient has potential for functional progress which will result in measurable gains while on inpatient rehab.  These gains will be of substantial and practical use upon discharge  in facilitating mobility and self-care at the household level. 5. Physiatrist will provide 24 hour management of medical needs as well as oversight of the therapy plan/treatment and provide guidance as appropriate regarding the interaction of the two. 6. The Preadmission Screening  has been reviewed and patient status is unchanged unless otherwise stated above. 7. 24 hour rehab  nursing will assist with bladder management, bowel management, safety, skin/wound care, disease management, medication administration, pain management and patient education  and help integrate therapy concepts, techniques,education, etc. 8. PT will assess and treat for/with: Lower extremity strength, range of motion, stamina, balance, functional mobility, safety, adaptive techniques and equipment, NMR, visual-spatial awareness, family education, community reintegration.   Goals are: mod I to supervision. 9. OT will assess and treat for/with: ADL's, functional mobility, safety, upper extremity strength, adaptive techniques and equipment, NMR, visual-spatial awareness, family education, ego support.   Goals are: mod I to supervision. Therapy may proceed with showering this patient. 10. SLP will assess and treat for/with: cognition, communication/language, family education.  Goals are: mod I to supervision. 11. Case Management and Social Worker will assess and treat for psychological issues and discharge planning. 12. Team conference will be held weekly to assess progress toward goals and to determine barriers to discharge. 13. Patient will receive at least 3 hours of therapy per day at least 5 days per week. 14. ELOS: 7-10 days       15. Prognosis:  excellent     [name], MD, [facility] 10/30/2016  Cathlyn Parsons., PA-C 10/30/2016

## 2016-10-30 NOTE — Progress Notes (Signed)
Removed patients peripheral IV, cannula intact.  Transferred patient to room 7 on 4MW.  Left face sheet by nurses things. Got patient into bed. Side rails up X 2, bad alarm on. Daughter at bedside.  Safety maintained.

## 2016-10-31 ENCOUNTER — Inpatient Hospital Stay (HOSPITAL_COMMUNITY): Payer: Medicare Other | Admitting: Physical Therapy

## 2016-10-31 ENCOUNTER — Inpatient Hospital Stay (HOSPITAL_COMMUNITY): Payer: Medicare Other | Admitting: Speech Pathology

## 2016-10-31 ENCOUNTER — Inpatient Hospital Stay (HOSPITAL_COMMUNITY): Payer: Medicare Other

## 2016-10-31 DIAGNOSIS — I639 Cerebral infarction, unspecified: Secondary | ICD-10-CM

## 2016-10-31 DIAGNOSIS — E1165 Type 2 diabetes mellitus with hyperglycemia: Secondary | ICD-10-CM

## 2016-10-31 LAB — GLUCOSE, CAPILLARY
GLUCOSE-CAPILLARY: 178 mg/dL — AB (ref 65–99)
GLUCOSE-CAPILLARY: 133 mg/dL — AB (ref 65–99)
Glucose-Capillary: 162 mg/dL — ABNORMAL HIGH (ref 65–99)
Glucose-Capillary: 120 mg/dL — ABNORMAL HIGH (ref 65–99)

## 2016-10-31 MED ORDER — GLIPIZIDE 2.5 MG HALF TABLET
2.5000 mg | ORAL_TABLET | Freq: Every day | ORAL | Status: DC
  Administered 2016-10-31 – 2016-11-04 (×5): 2.5 mg via ORAL
  Filled 2016-10-31 (×5): qty 1

## 2016-10-31 NOTE — Evaluation (Addendum)
Speech Language Pathology Assessment and Plan  Patient Details  Name: Jonathan [name] MRN: [id number] Date of Birth: 06-29-1929  SLP Diagnosis: Aphasia;Cognitive Impairments  Rehab Potential: Good ELOS: 10-14 days     Today's Date: 10/31/2016 SLP Individual Time: 8937-3428 SLP Individual Time Calculation (min): 62 min   Problem List:  Patient Active Problem List   Diagnosis Date Noted  . Expressive aphasia 10/30/2016  . Thalamic infarct, acute ([place]) 10/30/2016  . Cerebral thrombosis with cerebral infarction 10/29/2016  . Acute CVA (cerebrovascular accident) ([place]) 10/29/2016  . Syncope 10/28/2016  . Long-term (current) use of anticoagulants 10/17/2015  . Typical atrial flutter ([place])   . Diabetes mellitus ([place]) 09/28/2015  . Paroxysmal atrial fibrillation ([facility])   . Coronary atherosclerosis of native coronary artery 07/14/2013  . Mixed hyperlipidemia 07/14/2013  . Hypertensive heart disease without CHF    Past Medical History:  Past Medical History:  Diagnosis Date  . Allergic rhinitis   . BPH (benign prostatic hypertrophy)   . Coronary atherosclerosis of native coronary artery   . Degeneration of lumbar or lumbosacral intervertebral disc   . Diabetes ([place])    no meds  . Dyslipidemia   . HOH (hard of hearing)   . Hyperlipidemia   . Hypertension   . Right leg weakness   . S/P CABG x 4 2006   LIMA-LAD, SVG-rPDA, sSVG-OM1-OM2  . Stroke ([place])   . Wears glasses    reading   Past Surgical History:  Past Surgical History:  Procedure Laterality Date  . bil carpal tunnel surgery    . CARDIAC CATHETERIZATION N/A 10/01/2015   Procedure: Left Heart Cath and Cors/Grafts Angiography;  Surgeon: Peter M Martinique, MD;  Location: [facility];  Service: Cardiovascular;  Laterality: N/A;  . COLONOSCOPY    . CORONARY ARTERY BYPASS GRAFT  2006    Coronary artery bypass grafting x 4 (left internal mammary  . DUPUYTREN CONTRACTURE RELEASE Left 04/05/2014   Procedure: EXCISION  DUPUYTREN LEFT PALM, RING, SMALL, AND THUMB;  Surgeon: Cammie Sickle, MD;  Location: [facility];  Service: Orthopedics;  Laterality: Left;  . EYE SURGERY     both cataracts  . HEMORRHOID SURGERY    . LAMINECTOMY    . ROTATOR CUFF REPAIR    . TONSILLECTOMY    . TRANSURETHRAL RESECTION OF PROSTATE      Assessment / Plan / Recommendation Clinical Impression   Jonathan [name] a 83 y.o.right handed male who presented 10/28/2016 with altered mental status right-sided numbness and slurred speech. MRI imaging revealed acute 15 x 11 mm left thalamus infarct.  Tolerating a regular consistency diet. Physical therapy evaluation completed 10/29/2016 with recommendations of physical medicine rehabilitation consult.Patient was admitted for a comprehensive rehabilitation program on 10/30/2016.  SLP evaluation completed on 10/31/2016 with the following results:  Pt presents with a mild-moderate global aphasia characterized by breakdown with comprehension of complex information and word finding.  Verbal errors are characterized by perseveration, circumlocution, and semantic paraphasic errors.  Pt appears to have no awareness of verbal errors and became frustrated with attempts made by therapist to correct mistakes.  These language impairments are further exacerbated by decreased sustained attention to tasks.   As a result, pt would benefit from skilled ST while inpatient in order to maximize functional independence and reduce burden of care prior to discharge.  Anticipate that pt will need 24/7 supervision at discharge in addition to [facility] follow up at next level of care.  Skilled Therapeutic Interventions          Cognitive-linguistic evaluation completed with results and recommendations reviewed with family.   Provided skilled education regarding strategies to improve pt's awareness of verbal errors to pt's daughter.  Pt needed overall mod-max assist multimodal cues for topic maintenance, word  finding, and comprehension of complex information during both structured and unstructured therapeutic tasks.  He appeared to be able to sustain his attention to conversations with SLP for ~1 minute before requiring cues for redirection.  Pt was able to sustain his attention during basic, familiar tasks for longer periods of time (up to ~3 minutes).  Pt left sitting in wheelchair with daughter at bedside.      SLP Assessment  Patient will need skilled [facility] during CIR admission    Recommendations  Patient destination: Home Follow up Recommendations: 24 hour supervision/assistance;Home Health SLP;Outpatient SLP Equipment Recommended: None recommended by SLP    SLP Frequency 3 to 5 out of 7 days   SLP Duration  SLP Intensity  SLP Treatment/Interventions 10-14 days   Minumum of 1-2 x/day, 30 to 90 minutes  Cognitive remediation/compensation;Cueing hierarchy;Functional tasks;Internal/external aids;Patient/family education;Multimodal communication approach;Speech/Language facilitation    Pain Pain Assessment Pain Assessment: No/denies pain  Prior Functioning Cognitive/Linguistic Baseline: Within functional limits Type of Home: House  Lives With: Spouse Available Help at Discharge: Family;Available 24 hours/day Vocation: Retired  Function:  Eating Eating   Modified Consistency Diet: No Eating Assist Level: More than reasonable amount of time           Cognition Comprehension Comprehension assist level: Understands basic 50 - 74% of the time/ requires cueing 25 - 49% of the time  Expression   Expression assist level: Expresses basic 50 - 74% of the time/requires cueing 25 - 49% of the time. Needs to repeat parts of sentences.  Social Interaction Social Interaction assist level: Interacts appropriately 50 - 74% of the time - May be physically or verbally inappropriate.  Problem Solving Problem solving assist level: Solves basic 50 - 74% of the  time/requires cueing 25 - 49% of the time  Memory Memory assist level: Recognizes or recalls 25 - 49% of the time/requires cueing 50 - 75% of the time   Short Term Goals: Week 1: SLP Short Term Goal 1 (Week 1): Pt will recognize and correct verbal errors during functional tasks with mod assist multimodal cues.   SLP Short Term Goal 2 (Week 1): Pt will utilize compensatory word finding strategies during structured and unstructured language tasks with min assist verbal cues.   SLP Short Term Goal 3 (Week 1): Pt will follow multi-unit commands during functional tasks with min assist multimodal cues.   SLP Short Term Goal 4 (Week 1): Pt will sustain his attention to tasks for 3-5 minutes with min verbal cues for redirection.   SLP Short Term Goal 5 (Week 1): Pt will utilize memory compensatory strategies to facilitate recall of daily information with mod assist multimodal cues.    Refer to Care Plan for Long Term Goals  Recommendations for other services: None   Discharge Criteria: Patient will be discharged from SLP if patient refuses treatment 3 consecutive times without medical reason, if treatment goals not met, if there is a change in medical status, if patient makes no progress towards goals or if patient is discharged from hospital.  The above assessment, treatment plan, treatment alternatives and goals were discussed and mutually agreed upon: by patient  Arcenia Scarbro, Selinda Orion 10/31/2016,  4:35 PM

## 2016-10-31 NOTE — Evaluation (Signed)
Occupational Therapy Assessment and Plan  Patient Details  Name: Jonathan [name] MRN: [id number] Date of Birth: 10-Jul-1929  OT Diagnosis: cognitive deficits, ataxia, and muscle weakness (generalized) Rehab Potential: Rehab Potential (ACUTE ONLY): Good ELOS: 10-14 days   Today's Date: 10/31/2016 OT Individual Time: 1300-1415 OT Individual Time Calculation (min): 75 min     Problem List:  Patient Active Problem List   Diagnosis Date Noted  . Expressive aphasia 10/30/2016  . Thalamic infarct, acute ([place]) 10/30/2016  . Cerebral thrombosis with cerebral infarction 10/29/2016  . Acute CVA (cerebrovascular accident) ([place]) 10/29/2016  . Syncope 10/28/2016  . Long-term (current) use of anticoagulants 10/17/2015  . Typical atrial flutter ([place])   . Diabetes mellitus ([place]) 09/28/2015  . Paroxysmal atrial fibrillation ([facility])   . Coronary atherosclerosis of native coronary artery 07/14/2013  . Mixed hyperlipidemia 07/14/2013  . Hypertensive heart disease without CHF     Past Medical History:  Past Medical History:  Diagnosis Date  . Allergic rhinitis   . BPH (benign prostatic hypertrophy)   . Coronary atherosclerosis of native coronary artery   . Degeneration of lumbar or lumbosacral intervertebral disc   . Diabetes ([place])    no meds  . Dyslipidemia   . HOH (hard of hearing)   . Hyperlipidemia   . Hypertension   . Right leg weakness   . S/P CABG x 4 2006   LIMA-LAD, SVG-rPDA, sSVG-OM1-OM2  . Stroke ([place])   . Wears glasses    reading   Past Surgical History:  Past Surgical History:  Procedure Laterality Date  . bil carpal tunnel surgery    . CARDIAC CATHETERIZATION N/A 10/01/2015   Procedure: Left Heart Cath and Cors/Grafts Angiography;  Surgeon: Peter M Martinique, MD;  Location: [facility];  Service: Cardiovascular;  Laterality: N/A;  . COLONOSCOPY    . CORONARY ARTERY BYPASS GRAFT  2006    Coronary artery bypass grafting x 4 (left internal mammary  . DUPUYTREN  CONTRACTURE RELEASE Left 04/05/2014   Procedure: EXCISION DUPUYTREN LEFT PALM, RING, SMALL, AND THUMB;  Surgeon: Cammie Sickle, MD;  Location: [facility];  Service: Orthopedics;  Laterality: Left;  . EYE SURGERY     both cataracts  . HEMORRHOID SURGERY    . LAMINECTOMY    . ROTATOR CUFF REPAIR    . TONSILLECTOMY    . TRANSURETHRAL RESECTION OF PROSTATE      Assessment & Plan Clinical Impression: Patient is a 83 y.o. right handed malewith history of CAD status post CABG 2007, hypertension, PAF on Xareltoin the past. Presented 10/28/2016 with altered mental status right-sided numbness and slurred speech. MRI imaging revealed acute 15 x 11 mm left thalamus infarct. CT angiogram head and neck showed right posterior cerebral artery P1 occlusion. Atherosclerotic type changes. 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 Patient did not receive TPA. Echocardiogram with ejection fraction of 85% grade 1 diastolic dysfunction. EEG suggests possibility of mild encephalopathy. Negative for seizure.. Neurology follow-up currently onaspirin and Eliquis for CVA prophylaxis. Tolerating a regular consistency diet.    Patient transferred to CIR on 10/30/2016.    Patient currently requires mod assist with basic self-care skills secondary to impaired timing and sequencing and decreased coordination and decreased attention, decreased awareness, decreased problem solving, decreased safety awareness, decreased memory and delayed processing.  Prior to hospitalization, patient could complete BADL and iADL independently.   Patient will benefit from skilled intervention to decrease level of assist with basic self-care skills prior  to discharge home with care partner.  Anticipate patient will require 24 hour supervision and follow up home health.  OT - End of Session Activity Tolerance: Tolerates 30+ min activity with multiple rests Endurance Deficit: Yes OT Assessment Rehab Potential (ACUTE  ONLY): Good Barriers to Discharge: Decreased caregiver support OT Patient demonstrates impairments in the following area(s): Balance;Cognition;Perception;Safety;Vision OT Basic ADL's Functional Problem(s): Eating;Grooming;Bathing;Dressing;Toileting OT Transfers Functional Problem(s): Toilet;Tub/Shower OT Additional Impairment(s): None OT Plan OT Intensity: Minimum of 1-2 x/day, 45 to 90 minutes OT Frequency: 5 out of 7 days OT Duration/Estimated Length of Stay: 10-14 days OT Treatment/Interventions: Balance/vestibular training;Cognitive remediation/compensation;Discharge planning;Patient/family education;Self Care/advanced ADL retraining;Therapeutic Activities;Therapeutic Exercise;UE/LE Coordination activities;Visual/perceptual remediation/compensation OT Self Feeding Anticipated Outcome(s): Mod I OT Basic Self-Care Anticipated Outcome(s): Supervision OT Toileting Anticipated Outcome(s): Supervision OT Bathroom Transfers Anticipated Outcome(s): Supervision OT Recommendation Patient destination: Home Follow Up Recommendations: Home health OT Equipment Recommended: To be determined  Skilled Therapeutic Intervention OT 1:1 evaluation completed with treatment provided to address attention, memory, safety awareness, dynamic standing balance, transfers, adapted bathing/dressing skills, and family ed on observed deficits.   Pt required extra time to orient to task and process instructions.   Pt's overall mobility during transfers and ambulation required only steadying or standby assist however pt was inattentive to cues during session and demo'd LOB 3 times during transfers and dressing tasks.   Pt also demo'd moderate incoordination at both hand and confusion during bathing sequence while attempting manage hand shower, wash cloth and soap, requiring overall min assist to bathe successfully and steadying assist while standing with vc to use grab bars.  Pt was frustrated by memory deficits and  word-finding during interview although participating throughout session with intermittent encouragement from family present.  OT Evaluation Precautions/Restrictions  Precautions Precautions: Fall Restrictions Weight Bearing Restrictions: No  General Chart Reviewed: Yes Family/Caregiver Present: Yes [name] and Manuela Schwartz (daughter)  Vital Signs Therapy Vitals Temp: 98.2 F (36.8 C) Temp Source: Oral Pulse Rate: 87 Resp: 18 BP: (!) 141/66 Patient Position (if appropriate): Sitting Oxygen Therapy SpO2: 97 % O2 Device: Not Delivered  Pain Pain Assessment Pain Assessment: No/denies pain  Home Living/Prior Functioning Home Living Family/patient expects to be discharged to:: Private residence Living Arrangements: Spouse/significant other Available Help at Discharge: Family, Available 24 hours/day Type of Home: House Home Access: Stairs to enter [employer] of Steps: 1 Entrance Stairs-Rails: None Home Layout: One level Bathroom Shower/Tub: Multimedia programmer: Handicapped height Bathroom Accessibility: Yes Additional Comments: Pt unable to recall living situation, details provided by son.   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  Prior Function Level of Independence: Independent with basic ADLs, Independent with gait  Able to Take Stairs?: Yes Driving: Yes Vocation: Retired Comments: drives, does grocery shopping, independent with all ADL  ADL ADL ADL Comments: see Functional Assessment Tool  Vision/Perception  Vision- History Baseline Vision/History: No visual  deficits Patient Visual Report: Blurring of vision Vision- Assessment Vision Assessment?: Vision impaired- to be further tested in functional context   Cognition Overall Cognitive Status: Impaired/Different from baseline Arousal/Alertness: Awake/alert Orientation Level: Person (impaired place "nursing home" and situation "unaware of dx") Year:  (impaired 1988) Month: January Day of Week: Incorrect Memory: Impaired Memory Impairment: Storage deficit;Retrieval deficit Immediate Memory Recall: Sock;Blue;Bed Attention:  Focused Focused Attention: Appears intact Sustained Attention: Impaired Sustained Attention Impairment: Verbal basic;Functional basic Awareness: Impaired Awareness Impairment: Intellectual impairment Problem Solving: Impaired Problem Solving Impairment: Verbal basic;Functional basic Executive Function: Self Monitoring;Self Correcting Decision Making: Impaired Decision Making Impairment: Functional basic Self Monitoring: Impaired Self Monitoring Impairment: Verbal basic;Functional basic Self Correcting: Impaired Self Correcting Impairment: Verbal basic;Functional basic Behaviors: Poor frustration tolerance;Verbal agitation;Perseveration Safety/Judgment: Impaired Comments: poor awareness of deficits  Sensation Sensation Light Touch: Appears Intact Stereognosis: Appears Intact Hot/Cold: Appears Intact Proprioception: Appears Intact Coordination Gross Motor Movements are Fluid and Coordinated: No Fine Motor Movements are Fluid and Coordinated: No Coordination and Movement Description: Diminished [place] of bilateral hands  Motor  Motor Motor: Ataxia Motor - Skilled Clinical Observations: Mild ataxia in the LLE with gait  Mobility  Bed Mobility Bed Mobility: Rolling Right;Rolling Left;Supine to Sit;Sit to Supine;Sitting - Scoot to Edge of Bed Rolling Right: 5: Supervision Rolling Right Details: Verbal cues for technique Rolling Left: 5: Supervision Rolling  Left Details: Verbal cues for technique Supine to Sit: 5: Supervision Supine to Sit Details: Verbal cues for technique Sitting - Scoot to Edge of Bed: 5: Supervision Sitting - Scoot to [employer] of Bed Details: Verbal cues for technique Sit to Supine: 5: Supervision Sit to Supine - Details: Verbal cues for technique Transfers Sit to Stand: 4: Min assist Sit to Stand Details: Verbal cues for technique;Verbal cues for precautions/safety   Trunk/Postural Assessment  Cervical Assessment Cervical Assessment: Within Functional Limits Thoracic Assessment Thoracic Assessment: Within Functional Limits Lumbar Assessment Lumbar Assessment: Within Functional Limits Postural Control Postural Control: Within Functional Limits   Balance Balance Balance Assessed: Yes Standardized Balance Assessment Standardized Balance Assessment: Timed Up and Go Test Timed Up and Go Test TUG: Normal TUG Normal TUG (seconds): 18 Static Sitting Balance Static Sitting - Level of Assistance: 6: Modified independent (Device/Increase time) Dynamic Sitting Balance Dynamic Sitting - Level of Assistance: 5: Stand by assistance Static Standing Balance Static Standing - Balance Support: No upper extremity supported Static Standing - Level of Assistance: 5: Stand by assistance Dynamic Standing Balance Dynamic Standing - Balance Support: No upper extremity supported Dynamic Standing - Level of Assistance: 4: Min assist  Extremity/Trunk Assessment RUE Assessment RUE Assessment: Within Functional Limits LUE Assessment LUE Assessment: Within Functional Limits   See Function Navigator for Current Functional Status.   Refer to Care Plan for Long Term Goals  Recommendations for other services: None    Discharge Criteria: Patient will be discharged from OT if patient refuses treatment 3 consecutive times without medical reason, if treatment goals not met, if there is a change in medical status, if patient makes no  progress towards goals or if patient is discharged from hospital.  The above assessment, treatment plan, treatment alternatives and goals were discussed and mutually agreed upon: by patient and by family  [facility] 10/31/2016, 4:31 PM

## 2016-10-31 NOTE — H&P (Signed)
Physical Medicine and Rehabilitation Admission H&P       Chief Complaint  Patient presents with  . Altered Mental Status  : HPI: Jonathan [name] a 83 y.o.right handed malewith history of CAD status post CABG 2007, hypertension, PAF on Xareltoin the past. Presented 10/28/2016 with altered mental status right-sided numbness and slurred speech. Per chart review patient lives with her 50 year old wife. Reported to be independent prior to admission still driving.Son lives next door and works during the day.MRI imaging revealed acute 15 x 11 mm left thalamus infarct. CT angiogram head and neck showed right posterior cerebral artery P1 occlusion. Atherosclerotic type changes. 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 Patient did not receive TPA. Echocardiogram with ejection fraction of 96% grade 1 diastolic dysfunction. EEG suggests possibility of mild encephalopathy. Negative for seizure.. Neurology follow-up currently on aspirin and Eliquis for CVA prophylaxis.  Tolerating a regular consistency diet. Physical therapy evaluation completed 10/29/2016 with recommendations of physical medicine rehabilitation consult.Patient was admitted for a comprehensive rehabilitation program  Review of Systems  Constitutional: Negative for chills and fever.  HENT: Positive for hearing loss.   Eyes: Negative for blurred vision and double vision.  Respiratory: Negative for cough and shortness of breath.   Cardiovascular: Positive for palpitations and leg swelling.  Gastrointestinal: Positive for constipation. Negative for nausea and vomiting.  Genitourinary: Positive for frequency. Negative for hematuria and urgency.  Musculoskeletal: Positive for falls, joint pain and myalgias.  Skin: Negative for rash.  Neurological: Positive for speech change and weakness.  Psychiatric/Behavioral: Positive for memory loss.  All other systems reviewed and are negative.      Past Medical History:    Diagnosis Date  . Allergic rhinitis   . BPH (benign prostatic hypertrophy)   . Coronary atherosclerosis of native coronary artery   . Degeneration of lumbar or lumbosacral intervertebral disc   . Diabetes ([place])    no meds  . Dyslipidemia   . HOH (hard of hearing)   . Hyperlipidemia   . Hypertension   . Right leg weakness   . S/P CABG x 4 2006   LIMA-LAD, SVG-rPDA, sSVG-OM1-OM2  . Stroke ([place])   . Wears glasses    reading        Past Surgical History:  Procedure Laterality Date  . bil carpal tunnel surgery    . CARDIAC CATHETERIZATION N/A 10/01/2015   Procedure: Left Heart Cath and Cors/Grafts Angiography;  Surgeon: Peter M Martinique, MD;  Location: [facility];  Service: Cardiovascular;  Laterality: N/A;  . COLONOSCOPY    . CORONARY ARTERY BYPASS GRAFT  2006    Coronary artery bypass grafting x 4 (left internal mammary  . DUPUYTREN CONTRACTURE RELEASE Left 04/05/2014   Procedure: EXCISION DUPUYTREN LEFT PALM, RING, SMALL, AND THUMB;  Surgeon: Cammie Sickle, MD;  Location: [facility];  Service: Orthopedics;  Laterality: Left;  . EYE SURGERY     both cataracts  . HEMORRHOID SURGERY    . LAMINECTOMY    . ROTATOR CUFF REPAIR    . TONSILLECTOMY    . TRANSURETHRAL RESECTION OF PROSTATE          Family History  Problem Relation Age of Onset  . Hypertension Father    Social History:  reports that he has quit smoking. He has never used smokeless tobacco. He reports that he drinks alcohol. He reports that he does not use drugs. Allergies:       Allergies  Allergen  Reactions  . Amlodipine Other (See Comments)    Fatigue   . Codeine     unknown  . Metformin And Related Other (See Comments)    fatigue  . Statins     "pass out, dizzy, drunk feeling, poor balance"  . Welchol [Colesevelam Hcl]          Medications Prior to Admission  Medication Sig Dispense Refill  . isosorbide mononitrate  (IMDUR) 30 MG 24 hr tablet Take 1 tablet (30 mg total) by mouth daily. (Patient taking differently: Take 15 mg by mouth daily. ) 30 tablet 1    Home: [place] expects to be discharged to:: Private residence Living Arrangements: Spouse/significant other (pt unable to recall, detail provided by son) Available Help at Discharge: Family, Available 24 hours/day Type of Home: House Home Access: Level entry Home Layout: One level Bathroom Shower/Tub: Multimedia programmer: Handicapped height Home Equipment: Shower seat Additional Comments: Pt unable to recall living situation, details provided by son.    Functional History: Prior Function Level of Independence: Independent Comments: enjoys Dealer work  Functional Status:  Mobility: Bed Mobility Overal bed mobility: Needs Assistance Bed Mobility: Sit to Supine Supine to sit: Min guard Sit to supine: Min assist General bed mobility comments: Pt using rail for assist Transfers Overall transfer level: Needs assistance Equipment used: Rolling walker (2 wheeled), None Transfers: Sit to/from Stand Sit to Stand: Min assist General transfer comment: repeated cues for safety needed, pt not consistenlty following commands Ambulation/Gait Ambulation/Gait assistance: Museum/gallery curator (Feet): 110 Feet Assistive device: Rolling walker (2 wheeled) Gait Pattern/deviations: Step-through pattern, Decreased step length - right, Decreased step length - left, Trunk flexed General Gait Details: Pt having difficulty with Rt UE grip of rw, poor safety awareness using rw, assist needed to avoid objects in room.  Gait velocity: decreased  ADL: ADL Overall ADL's : Needs assistance/impaired Eating/Feeding: Set up, Sitting Eating/Feeding Details (indicate cue type and reason): Son reports pt having difficulty self feeding with use of R hand but able to complete with increased time. Grooming: Min guard,  Sitting Upper Body Bathing: Min guard, Sitting Lower Body Bathing: Sit to/from stand Upper Body Dressing : Min guard, Sitting Lower Body Dressing: Sit to/from stand, Moderate assistance Lower Body Dressing Details (indicate cue type and reason): Min assist to don socks sitting EOB Toilet Transfer: Minimal assistance, Ambulation, BSC, RW, Cueing for safety, Cueing for sequencing Toilet Transfer Details (indicate cue type and reason): Simulated by sit to stand from EOB with short distance functional mobility in room. Functional mobility during ADLs: Minimal assistance, Rolling walker, Cueing for safety, Cueing for sequencing General ADL Comments: Discussed post acute rehab with pt and son; they are agreeable.  Cognition: Cognition Overall Cognitive Status: Impaired/Different from baseline Orientation Level: Oriented to person, Oriented to place, Oriented to time, Oriented to situation Cognition Arousal/Alertness: Awake/alert Behavior During Therapy: Flat affect Overall Cognitive Status: Impaired/Different from baseline Area of Impairment: Orientation, Following commands, Safety/judgement, Awareness Orientation Level: Disoriented to, Person, Time, Situation Memory: Decreased short-term memory Following Commands: Follows one step commands inconsistently Safety/Judgement: Decreased awareness of safety, Decreased awareness of deficits Awareness: Intellectual Problem Solving: Slow processing, Requires verbal cues, Requires tactile cues General Comments: Pt unable to identify "cactus" "hammock" on NIH picture sheet. Difficulty with DOB. Cues for sequencing and initiation.  Physical Exam: Blood pressure (!) 164/82, pulse 69, temperature 97.5 F (36.4 C), temperature source Oral, resp. rate 20, height _0  (1.803 m), weight 74.8 kg (164  lb 14.4 oz), SpO2 96 %. Physical Exam  Constitutional: He appears well-developed and well-nourished.  HENT:  Head: Normocephalic and atraumatic.  Eyes:  EOM are normal. Left eye exhibits no discharge.  Neck: Normal range of motion. Neck supple. No JVD present. No tracheal deviation present. Thyromegaly present.  Cardiovascular: Exam reveals no gallop and no friction rub.   No murmur heard. Cardiac rate control  Respiratory: Effort normal and breath sounds normal. No respiratory distress. He has no wheezes. He has no rales.  GI: Soft. Bowel sounds are normal. He exhibits no distension. There is no tenderness. There is no rebound.  Musculoskeletal: He exhibits no edema.  Skin warm and dry Neurological: He is alert and oriented to hospital. Could not tell me the date. Provided home address. Follows simple commands but continued delays in processing. Decreased awareness. Speech is slurred. Displays mild expressive language deficits also. Motor 4+/5 RUE and 4 to 4+/5 RLE. LUE and LLE 4+ to 5/5. Sensation diminished trace amount RUE, RLE. +pronator drift Psych: pleasant and appropriate  Lab Results Last 48 Hours        Results for orders placed or performed during the hospital encounter of 10/28/16 (from the past 48 hour(s))  CBC with Differential     Status: Abnormal   Collection Time: 10/28/16  5:54 PM  Result Value Ref Range   WBC 8.6 4.0 - 10.5 K/uL   RBC 4.86 4.22 - 5.81 MIL/uL   Hemoglobin 12.9 (L) 13.0 - 17.0 g/dL   HCT 38.5 (L) 39.0 - 52.0 %   MCV 79.2 78.0 - 100.0 fL   MCH 26.5 26.0 - 34.0 pg   MCHC 33.5 30.0 - 36.0 g/dL   RDW 14.0 11.5 - 15.5 %   Platelets 241 150 - 400 K/uL   Neutrophils Relative % 76 %   Neutro Abs 6.6 1.7 - 7.7 K/uL   Lymphocytes Relative 15 %   Lymphs Abs 1.3 0.7 - 4.0 K/uL   Monocytes Relative 6 %   Monocytes Absolute 0.5 0.1 - 1.0 K/uL   Eosinophils Relative 2 %   Eosinophils Absolute 0.2 0.0 - 0.7 K/uL   Basophils Relative 1 %   Basophils Absolute 0.0 0.0 - 0.1 K/uL  Basic metabolic panel     Status: Abnormal   Collection Time: 10/28/16  5:54 PM  Result Value Ref Range    Sodium 139 135 - 145 mmol/L   Potassium 3.9 3.5 - 5.1 mmol/L   Chloride 104 101 - 111 mmol/L   CO2 27 22 - 32 mmol/L   Glucose, Bld 133 (H) 65 - 99 mg/dL   BUN 15 6 - 20 mg/dL   Creatinine, Ser 0.71 0.61 - 1.24 mg/dL   Calcium 9.2 8.9 - 10.3 mg/dL   GFR calc non Af Amer >60 >60 mL/min   GFR calc Af Amer >60 >60 mL/min    Comment: (NOTE) The eGFR has been calculated using the CKD EPI equation. This calculation has not been validated in all clinical situations. eGFR's persistently <60 mL/min signify possible Chronic Kidney Disease.    Anion gap 8 5 - 15  I-stat troponin, ED     Status: None   Collection Time: 10/28/16  6:21 PM  Result Value Ref Range   Troponin i, poc 0.00 0.00 - 0.08 ng/mL   Comment 3            Comment: Due to the release kinetics of cTnI, a negative result within the first hours of the  onset of symptoms does not rule out myocardial infarction with certainty. If myocardial infarction is still suspected, repeat the test at appropriate intervals.   Urinalysis, Routine w reflex microscopic     Status: None   Collection Time: 10/28/16  7:00 PM  Result Value Ref Range   Color, Urine YELLOW YELLOW   APPearance CLEAR CLEAR   Specific Gravity, Urine 1.014 1.005 - 1.030   pH 7.0 5.0 - 8.0   Glucose, UA NEGATIVE NEGATIVE mg/dL   Hgb urine dipstick NEGATIVE NEGATIVE   Bilirubin Urine NEGATIVE NEGATIVE   Ketones, ur NEGATIVE NEGATIVE mg/dL   Protein, ur NEGATIVE NEGATIVE mg/dL   Nitrite NEGATIVE NEGATIVE   Leukocytes, UA NEGATIVE NEGATIVE  .Cooxemetry Panel (carboxy, met, total hgb, O2 sat)     Status: None   Collection Time: 10/28/16  8:25 PM  Result Value Ref Range   Total hemoglobin 12.3 12.0 - 16.0 g/dL   O2 Saturation 95.3 %   Carboxyhemoglobin 1.1 0.5 - 1.5 %   Methemoglobin 1.0 0.0 - 1.5 %  Lipid panel     Status: Abnormal   Collection Time: 10/29/16  6:49 AM  Result Value Ref Range   Cholesterol 183 0 - 200 mg/dL    Triglycerides 161 (H) <150 mg/dL   HDL 25 (L) >40 mg/dL   Total CHOL/HDL Ratio 7.3 RATIO   VLDL 32 0 - 40 mg/dL   LDL Cholesterol 126 (H) 0 - 99 mg/dL    Comment:        Total Cholesterol/HDL:CHD Risk Coronary Heart Disease Risk Table                     Men   Women  1/2 Average Risk   3.4   3.3  Average Risk       5.0   4.4  2 X Average Risk   9.6   7.1  3 X Average Risk  23.4   11.0        Use the calculated Patient Ratio above and the CHD Risk Table to determine the patient's CHD Risk.        ATP III CLASSIFICATION (LDL):  <100     mg/dL   Optimal  100-129  mg/dL   Near or Above                    Optimal  130-159  mg/dL   Borderline  160-189  mg/dL   High  >190     mg/dL   Very High   Hemoglobin A1c     Status: Abnormal   Collection Time: 10/29/16  6:49 AM  Result Value Ref Range   Hgb A1c MFr Bld 8.5 (H) 4.8 - 5.6 %    Comment: (NOTE)         Pre-diabetes: 5.7 - 6.4         Diabetes: >6.4         Glycemic control for adults with diabetes: <7.0    Mean Plasma Glucose 197 mg/dL    Comment: (NOTE) Performed At: [facility] [place], [place] [id number] Lindon Romp MD [id number]       Imaging Results (Last 24 hours)  Ct Angio Head W Or Wo Contrast  Addendum Date: 10/29/2016   ADDENDUM REPORT: 10/29/2016 02:03 ADDENDUM: These results were called by telephone at the time of interpretation on 10/29/2016 at 2:02 am to Dr. Loleta Books, who verbally acknowledged these results. Electronically Signed  By: Kristine Garbe M.D.   On: 10/29/2016 02:03   Result Date: 10/29/2016 CLINICAL DATA:  83 y/o  M; stroke patient with follow-up scan. EXAM: CT ANGIOGRAPHY HEAD AND NECK TECHNIQUE: Multidetector CT imaging of the head and neck was performed using the standard protocol during bolus administration of intravenous contrast. Multiplanar CT image reconstructions and MIPs were obtained to evaluate the vascular anatomy.  Carotid stenosis measurements (when applicable) are obtained utilizing NASCET criteria, using the distal internal carotid diameter as the denominator. CONTRAST:  50 cc Isovue 370 COMPARISON:  10/28/2016 MRI of the head FINDINGS: CTA NECK FINDINGS Aortic arch: Severe aortic atherosclerosis with calcification and shaggy fibrofatty plaque. Bovine aortic arch, normal variant. Common origin of left vertebral artery and left subclavian artery. No significant stenosis of great vessel origins. Right carotid system: No evidence of dissection, stenosis (50% or greater) or occlusion. Mild mixed plaque of the bifurcation. Left carotid system: No evidence of dissection, stenosis (50% or greater) or occlusion. Predominantly fibrofatty plaque of the bifurcation with mild 40% stenosis of proximal ICA. Vertebral arteries: Right vertebral origin mixed plaque with severe stenosis (series 8, image 140). No evidence of dissection or occlusion. Skeleton: Moderate cervical spondylosis with discogenic and facet degenerative changes greatest at the C5 through C7 levels. No high-grade bony canal stenosis or foraminal narrowing. Other neck: Lipoma centered in the right anterior neck at the level of larynx anterior to sternocleidomastoid muscle measuring 40 x 21 x 42 mm (AP x ML x CC) series 5, image 70 and series 8, image 100. Subcentimeter calcified thyroid nodules. Upper chest: Negative. Review of the MIP images confirms the above findings CTA HEAD FINDINGS Anterior circulation: Calcified plaque of bilateral cavernous and paraclinoid internal carotid artery is without high-grade stenosis. Irregularity of bilateral M1 segments with distal right M1 mild stenosis and no significant left-sided stenosis. Several short segments of mild stenosis of bilateral ACA and MCA. Posterior circulation: Irregularity of right V4 segment with mild stenosis. Calcified plaque of left V4 segment without significant stenosis. Mild narrowing of the upper basilar  artery. Severe stenosis of proximal right AICA. Short segment of moderate stenosis of left P1 segment. Proximal right P1 occlusion (series 7, image 101 and series 11, image 26). Venous sinuses: As permitted by contrast timing, patent. Anatomic variants: Small anterior communicating artery. No posterior communicating artery identified, likely hypoplastic or absent. Delayed phase: No abnormal intracranial enhancement. Review of the MIP images confirms the above findings IMPRESSION: 1. Right posterior cerebral artery P1 occlusion. 2. Otherwise no evidence for large vessel occlusion, aneurysm, or vascular malformation of circle of Willis. 3. Extensive intracranial atherosclerosis with multiple segments of stenosis in the anterior and posterior circulation. 4. Severe aortic atherosclerosis with calcified and shaggy fibrofatty plaque. 5. Mild 40% stenosis of proximal left ICA. 6. 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 7. Otherwise carotid and vertebral arteries of the neck are patent without evidence of occlusion, aneurysm, dissection, or high-grade stenosis. 8. 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 Electronically Signed: By: Kristine Garbe M.D. On: 10/29/2016 01:53   Dg Chest 2 View  Result Date: 10/28/2016 CLINICAL DATA:  Altered mental status EXAM: CHEST  2 VIEW COMPARISON:  10/02/2015 FINDINGS: AP and lateral views of the chest show low volumes. The cardio pericardial silhouette is enlarged. There is pulmonary vascular congestion without overt pulmonary edema. Patient is status post CABG. IMPRESSION: No active cardiopulmonary disease. Electronically Signed   By: Misty Stanley M.D.   On: 10/28/2016 18:34  Ct Head Wo Contrast  Result Date: 10/28/2016 CLINICAL DATA:  Altered mental status possible fall EXAM: CT HEAD WITHOUT CONTRAST TECHNIQUE: Contiguous axial images were obtained from the base of the skull through the vertex without intravenous contrast. COMPARISON:  MRI  02/21/2016, CT 08/07/2015 FINDINGS: Brain: No acute territorial infarction, intracranial hemorrhage or extra-axial fluid collection is seen. No focal mass, mass effect or midline shift. Mild to moderate periventricular white matter hypodensity consistent with small vessel disease. Old left thalamic lacunar infarcts as before. Old left cerebellar infarct as before. Ventricles are similar in size compared to the prior study. There is mild atrophy. Vascular: No hyperdense vessels.  Carotid artery calcifications. Skull: Mastoid air cells clear.  No skull fracture Sinuses/Orbits: Mucosal thickening in the maxillary ethmoid and sphenoid sinuses. No acute orbital abnormality. Bilateral lens extraction. Other: None IMPRESSION: No definite CT evidence for acute intracranial abnormality Electronically Signed   By: Donavan Foil M.D.   On: 10/28/2016 18:49   Ct Angio Neck W Or Wo Contrast  Addendum Date: 10/29/2016   ADDENDUM REPORT: 10/29/2016 02:03 ADDENDUM: These results were called by telephone at the time of interpretation on 10/29/2016 at 2:02 am to Dr. Loleta Books, who verbally acknowledged these results. Electronically Signed   By: Kristine Garbe M.D.   On: 10/29/2016 02:03   Result Date: 10/29/2016 CLINICAL DATA:  83 y/o  M; stroke patient with follow-up scan. EXAM: CT ANGIOGRAPHY HEAD AND NECK TECHNIQUE: Multidetector CT imaging of the head and neck was performed using the standard protocol during bolus administration of intravenous contrast. Multiplanar CT image reconstructions and MIPs were obtained to evaluate the vascular anatomy. Carotid stenosis measurements (when applicable) are obtained utilizing NASCET criteria, using the distal internal carotid diameter as the denominator. CONTRAST:  50 cc Isovue 370 COMPARISON:  10/28/2016 MRI of the head FINDINGS: CTA NECK FINDINGS Aortic arch: Severe aortic atherosclerosis with calcification and shaggy fibrofatty plaque. Bovine aortic arch, normal variant.  Common origin of left vertebral artery and left subclavian artery. No significant stenosis of great vessel origins. Right carotid system: No evidence of dissection, stenosis (50% or greater) or occlusion. Mild mixed plaque of the bifurcation. Left carotid system: No evidence of dissection, stenosis (50% or greater) or occlusion. Predominantly fibrofatty plaque of the bifurcation with mild 40% stenosis of proximal ICA. Vertebral arteries: Right vertebral origin mixed plaque with severe stenosis (series 8, image 140). No evidence of dissection or occlusion. Skeleton: Moderate cervical spondylosis with discogenic and facet degenerative changes greatest at the C5 through C7 levels. No high-grade bony canal stenosis or foraminal narrowing. Other neck: Lipoma centered in the right anterior neck at the level of larynx anterior to sternocleidomastoid muscle measuring 40 x 21 x 42 mm (AP x ML x CC) series 5, image 70 and series 8, image 100. Subcentimeter calcified thyroid nodules. Upper chest: Negative. Review of the MIP images confirms the above findings CTA HEAD FINDINGS Anterior circulation: Calcified plaque of bilateral cavernous and paraclinoid internal carotid artery is without high-grade stenosis. Irregularity of bilateral M1 segments with distal right M1 mild stenosis and no significant left-sided stenosis. Several short segments of mild stenosis of bilateral ACA and MCA. Posterior circulation: Irregularity of right V4 segment with mild stenosis. Calcified plaque of left V4 segment without significant stenosis. Mild narrowing of the upper basilar artery. Severe stenosis of proximal right AICA. Short segment of moderate stenosis of left P1 segment. Proximal right P1 occlusion (series 7, image 101 and series 11, image 26). Venous sinuses: As permitted  by contrast timing, patent. Anatomic variants: Small anterior communicating artery. No posterior communicating artery identified, likely hypoplastic or absent. Delayed  phase: No abnormal intracranial enhancement. Review of the MIP images confirms the above findings IMPRESSION: 1. Right posterior cerebral artery P1 occlusion. 2. Otherwise no evidence for large vessel occlusion, aneurysm, or vascular malformation of circle of Willis. 3. Extensive intracranial atherosclerosis with multiple segments of stenosis in the anterior and posterior circulation. 4. Severe aortic atherosclerosis with calcified and shaggy fibrofatty plaque. 5. Mild 40% stenosis of proximal left ICA. 6. 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 7. Otherwise carotid and vertebral arteries of the neck are patent without evidence of occlusion, aneurysm, dissection, or high-grade stenosis. 8. 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 Electronically Signed: By: Kristine Garbe M.D. On: 10/29/2016 01:53   Mr Brain Wo Contrast  Result Date: 10/28/2016 CLINICAL DATA:  [place] while shoveling snow. Confusion, change in speech. History of hypertension, hyperlipidemia. EXAM: MRI HEAD WITHOUT CONTRAST TECHNIQUE: Multiplanar, multiecho pulse sequences of the brain and surrounding structures were obtained without intravenous contrast. COMPARISON:  CT HEAD October 28, 2016 and MRI head Feb 21, 2016 FINDINGS: BRAIN: 15 x 11 mm focus of reduced diffusion mesial LEFT thalamus with low ADC values. 3 mm T2 hyperintensity LEFT thalamus most consistent with perivascular space, present on prior MRI. No susceptibility artifact to suggest hemorrhage. Ventricles and sulci are normal for patient's age. Old small LEFT cerebellar infarct. Patchy supratentorial white matter FLAIR T2 hyperintensities. No abnormal extra-axial fluid collections. VASCULAR: Similar probable slow flow RIGHT vertebral artery, major intracranial vascular flow voids otherwise maintained. SKULL AND UPPER CERVICAL SPINE: No abnormal sellar expansion. No suspicious calvarial bone marrow signal. Craniocervical junction maintained. SINUSES/ORBITS:  Trace paranasal sinus mucosal thickening. Mastoid air cells are well aerated. Status post bilateral ocular lens implants. The included ocular globes and orbital contents are non-suspicious. OTHER: None. IMPRESSION: Acute 15 x 11 mm LEFT thalamus infarct. Otherwise stable appearance of the brain including old small LEFT cerebellar infarct and mild to moderate chronic small vessel ischemic disease. Electronically Signed   By: Elon Alas M.D.   On: 10/28/2016 21:28        Medical Problem List and Plan: 1. Right hemisensory deficits/cognitive deficits  secondary to left thalamic infarct             -admitted to inpatient rehab, begin therapies 2.  DVT Prophylaxis/Anticoagulation: Eliquis. 3. Pain Management: Tylenol as needed 4. Mood: Provide emotional support 5. Neuropsych: This patient is not capable of making decisions on his own behalf. 6. Skin/Wound Care: Routine skin checks 7. Fluids/Electrolytes/Nutrition: Routine I&O with follow-up chemistries during admit   8. CAD status post CABG. Continue aspirin 9. PAF. Maintain on Eliquis. Continue Imdur 15 mg daily 10. Hyperlipidemia. Pravachol 11. Hyperglycemia/DM 2  -pt on glucotrol at home  -ssi  -CM diet   Post Admission Physician Evaluation: 1. Functional deficits secondary  to left thalamic infarct. 2. Patient is admitted to receive collaborative, interdisciplinary care between the physiatrist, rehab nursing staff, and therapy team. 3. Patient's level of medical complexity and substantial therapy needs in context of that medical necessity cannot be provided at a lesser intensity of care such as a SNF. 4. Patient has experienced substantial functional loss from his/her baseline which was documented above under the "Functional History" and "Functional Status" headings.  Judging by the patient's diagnosis, physical exam, and functional history, the patient has potential for functional progress which will result in measurable  gains while  on inpatient rehab.  These gains will be of substantial and practical use upon discharge  in facilitating mobility and self-care at the household level. 5. Physiatrist will provide 24 hour management of medical needs as well as oversight of the therapy plan/treatment and provide guidance as appropriate regarding the interaction of the two. 6. The Preadmission Screening has been reviewed and patient status is unchanged unless otherwise stated above. 7. 24 hour rehab nursing will assist with bladder management, bowel management, safety, skin/wound care, disease management, medication administration, pain management and patient education  and help integrate therapy concepts, techniques,education, etc. 8. PT will assess and treat for/with: Lower extremity strength, range of motion, stamina, balance, functional mobility, safety, adaptive techniques and equipment, NMR, visual-spatial awareness, family education, community reintegration.   Goals are: mod I to supervision. 9. OT will assess and treat for/with: ADL's, functional mobility, safety, upper extremity strength, adaptive techniques and equipment, NMR, visual-spatial awareness, family education, ego support.   Goals are: mod I to supervision. Therapy may proceed with showering this patient. 10. SLP will assess and treat for/with: cognition, communication/language, family education.  Goals are: mod I to supervision. 11. Case Management and Social Worker will assess and treat for psychological issues and discharge planning. 12. Team conference will be held weekly to assess progress toward goals and to determine barriers to discharge. 13. Patient will receive at least 3 hours of therapy per day at least 5 days per week. 14. ELOS: 7-10 days       15. Prognosis:  excellent     [name], MD, [facility] 10/31/2016  Cathlyn Parsons., PA-C 10/30/2016

## 2016-10-31 NOTE — Evaluation (Signed)
Physical Therapy Assessment and Plan  Patient Details  Name: Jonathan [name] MRN: [id number] Date of Birth: [birth year]  PT Diagnosis: Abnormality of gait, Ataxia and Muscle weakness Rehab Potential: Excellent ELOS: 7-10 days    Today's Date: 10/31/2016 PT Individual Time: 0801-0900 PT Individual Time Calculation (min): 59 min    Problem List:  Patient Active Problem List   Diagnosis Date Noted  . Expressive aphasia 10/30/2016  . Thalamic infarct, acute ([place]) 10/30/2016  . Cerebral thrombosis with cerebral infarction 10/29/2016  . Acute CVA (cerebrovascular accident) ([place]) 10/29/2016  . Syncope 10/28/2016  . Long-term (current) use of anticoagulants 10/17/2015  . Typical atrial flutter ([place])   . Diabetes mellitus ([place]) 09/28/2015  . Paroxysmal atrial fibrillation ([facility])   . Coronary atherosclerosis of native coronary artery 07/14/2013  . Mixed hyperlipidemia 07/14/2013  . Hypertensive heart disease without CHF     Past Medical History:  Past Medical History:  Diagnosis Date  . Allergic rhinitis   . BPH (benign prostatic hypertrophy)   . Coronary atherosclerosis of native coronary artery   . Degeneration of lumbar or lumbosacral intervertebral disc   . Diabetes ([place])    no meds  . Dyslipidemia   . HOH (hard of hearing)   . Hyperlipidemia   . Hypertension   . Right leg weakness   . S/P CABG x 4 2006   LIMA-LAD, SVG-rPDA, sSVG-OM1-OM2  . Stroke ([place])   . Wears glasses    reading   Past Surgical History:  Past Surgical History:  Procedure Laterality Date  . bil carpal tunnel surgery    . CARDIAC CATHETERIZATION N/A 10/01/2015   Procedure: Left Heart Cath and Cors/Grafts Angiography;  Surgeon: Peter M Martinique, MD;  Location: [facility];  Service: Cardiovascular;  Laterality: N/A;  . COLONOSCOPY    . CORONARY ARTERY BYPASS GRAFT  2006    Coronary artery bypass grafting x 4 (left internal mammary  . DUPUYTREN CONTRACTURE RELEASE Left 04/05/2014   Procedure:  EXCISION DUPUYTREN LEFT PALM, RING, SMALL, AND THUMB;  Surgeon: Cammie Sickle, MD;  Location: [facility];  Service: Orthopedics;  Laterality: Left;  . EYE SURGERY     both cataracts  . HEMORRHOID SURGERY    . LAMINECTOMY    . ROTATOR CUFF REPAIR    . TONSILLECTOMY    . TRANSURETHRAL RESECTION OF PROSTATE      Assessment & Plan Clinical Impression: Patient is a 83 y.o.right handed malewith history of CAD status post CABG 2007, hypertension, PAF on Xareltoin the past. Presented 10/28/2016 with altered mental status right-sided numbness and slurred speech. Per chart review patient lives with her 24 year old wife. Reported to be independent prior to admission still driving.Son lives next door and works during the day.MRI imaging revealed acute 15 x 11 mm left thalamus infarct. CT angiogram head and neck showed right posterior cerebral artery P1 occlusion. Atherosclerotic type changes. 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 Patient did not receive TPA. Echocardiogram with ejection fraction of 00% grade 1 diastolic dysfunction. EEG suggests possibility of mild encephalopathy. Negative for seizure.. Neurology follow-up currently on aspirin and Eliquis for CVA prophylaxis. Patient transferred to CIR on 10/30/2016 .   Patient currently requires min with mobility secondary to muscle weakness, ataxia and decreased standing balance and decreased balance strategies.  Prior to hospitalization, patient was independent  with mobility and lived with Spouse in a House home.  Home access is 1Stairs to enter.  Patient will benefit from skilled  PT intervention to maximize safe functional mobility, minimize fall risk and decrease caregiver burden for planned discharge home with 24 hour supervision.  Anticipate patient will benefit from follow up [place] at discharge.  PT - End of Session Activity Tolerance: Tolerates 30+ min activity without fatigue Endurance Deficit: No PT  Assessment Rehab Potential (ACUTE/IP ONLY): Excellent PT Patient demonstrates impairments in the following area(s): Balance;Motor;Safety PT Transfers Functional Problem(s): Bed Mobility;Bed to Chair;Car;Furniture;Floor PT Locomotion Functional Problem(s): Ambulation;Wheelchair Mobility;Stairs PT Plan PT Intensity: Minimum of 1-2 x/day ,45 to 90 minutes PT Frequency: 5 out of 7 days PT Duration Estimated Length of Stay: 7-10 days  PT Treatment/Interventions: Ambulation/gait training;Balance/vestibular training;Cognitive remediation/compensation;Community reintegration;Discharge planning;Disease management/prevention;DME/adaptive equipment instruction;Functional mobility training;Neuromuscular re-education;Pain management;Patient/family education;Psychosocial support;Stair training;Therapeutic Exercise;UE/LE Coordination activities;Therapeutic Activities;UE/LE Strength taining/ROM;Visual/perceptual remediation/compensation;Wheelchair propulsion/positioning PT Transfers Anticipated Outcome(s): mod I - Supervision with LRAD  PT Locomotion Anticipated Outcome(s): Ambulatory with supervision with LRAD  PT Recommendation Follow Up Recommendations: Home health PT Patient destination: Home Equipment Recommended: To be determined     Skilled Therapeutic Intervention PT instructed patient in Evaluation and initiated treatment intervention. See below for results. Overall patient required min assist for gait and transfers as supervision assist for bed mobility. PT educated patient and Daughter in [place], length of stay, and treatment interventions. Patient left sitting in recliner with quick release belt in place and all needs met.   PT Evaluation Precautions/Restrictions Precautions Precautions: Fall Restrictions Weight Bearing Restrictions: No General   Vital Signs Pain Pain Assessment Pain Assessment: No/denies pain Home Living/Prior Functioning Home Living Available Help at Discharge:  Family;Available 24 hours/day Type of Home: House Home Access: Stairs to enter [employer] of Steps: 1 Entrance Stairs-Rails: None Home Layout: One level (one step from sun room into main level) Bathroom Shower/Tub: Multimedia programmer: Handicapped height  Lives With: Spouse Prior Function Level of Independence: Independent with basic ADLs;Independent with homemaking with ambulation  Able to Take Stairs?: Yes Driving: Yes Vocation: Retired Comments: drives, does grocery shopping, independent with all ADL Vision/Perception  Vision - Assessment Additional Comments: pt reports decreased accuity in the L eye Perception Comments: WFL  Cognition Orientation Level: Oriented to person;Disoriented to place;Oriented to time;disoriented to situation Sensation Sensation Light Touch: Appears Intact Coordination Gross Motor Movements are Fluid and Coordinated: No Finger Nose Finger Test: mild Decreased speed on the L compared to the R  Heel Shin Test: mild decreased speed  on L compared to the R.  Motor  Motor Motor: Ataxia Motor - Skilled Clinical Observations: Mild ataxia in the LLE with gait  Mobility Bed Mobility Bed Mobility: Rolling Right;Rolling Left;Supine to Sit;Sit to Supine;Sitting - Scoot to Edge of Bed Rolling Right: 5: Supervision Rolling Right Details: Verbal cues for technique Rolling Left: 5: Supervision Rolling Left Details: Verbal cues for technique Supine to Sit: 5: Supervision Supine to Sit Details: Verbal cues for technique Sitting - Scoot to Edge of Bed: 5: Supervision Sitting - Scoot to [employer] of Bed Details: Verbal cues for technique Sit to Supine: 5: Supervision Sit to Supine - Details: Verbal cues for technique Transfers Transfers: Yes Sit to Stand: 4: Min assist Sit to Stand Details: Verbal cues for technique;Verbal cues for precautions/safety Stand Pivot Transfers: 4: Min Psychologist, occupational Details: Verbal cues for  technique;Verbal cues for precautions/safety Locomotion  Ambulation Ambulation: Yes Ambulation/Gait Assistance: 4: Min assist Ambulation Distance (Feet): 280 Feet Assistive device: None Ambulation/Gait Assistance Details: Verbal cues for gait pattern;Tactile cues for weight shifting Ambulation/Gait Assistance Details: Decreased WB  through the LLE and mild ataxia.  Gait Gait: Yes Gait Pattern: Step-through pattern;Ataxic;Poor foot clearance - left;Poor foot clearance - right Stairs / Additional Locomotion Stairs: Yes Stairs Assistance: 4: Min assist Stairs Assistance Details: Verbal cues for technique;Verbal cues for precautions/safety Stair Management Technique: Two rails Number of Stairs: 12 Height of Stairs: 3 (and 6) Product manager Mobility: Yes Wheelchair Assistance: 5: Investment banker, operational Details: Verbal cues for technique;Verbal cues for safe use of DME/AE Wheelchair Propulsion: Both lower extermities Wheelchair Parts Management: Needs assistance Distance: 165f  Trunk/Postural Assessment  Cervical Assessment Cervical Assessment: Within Functional Limits Thoracic Assessment Thoracic Assessment: Within Functional Limits Lumbar Assessment Lumbar Assessment: Within Functional Limits Postural Control Postural Control: Within Functional Limits  Balance Balance Balance Assessed: Yes Standardized Balance Assessment Standardized Balance Assessment: Timed Up and Go Test Timed Up and Go Test TUG: Normal TUG Normal TUG (seconds): 18 (with min assist) Static Sitting Balance Static Sitting - Level of Assistance: 6: Modified independent (Device/Increase time) Dynamic Sitting Balance Dynamic Sitting - Level of Assistance: 5: Stand by assistance Static Standing Balance Static Standing - Balance Support: No upper extremity supported Static Standing - Level of Assistance: 5: Stand by assistance Dynamic Standing Balance Dynamic Standing - Balance  Support: No upper extremity supported Dynamic Standing - Level of Assistance: 4: Min assist Extremity Assessment        RLE : 4+/5 throughout    LLE: 4/5 to 4+/5 throughout LE     See Function Navigator for Current Functional Status.   Refer to Care Plan for Long Term Goals  Recommendations for other services: Therapeutic Recreation  Pet therapy, Kitchen group and Outing/community reintegration  Discharge Criteria: Patient will be discharged from PT if patient refuses treatment 3 consecutive times without medical reason, if treatment goals not met, if there is a change in medical status, if patient makes no progress towards goals or if patient is discharged from hospital.  The above assessment, treatment plan, treatment alternatives and goals were discussed and mutually agreed upon: by patient  ALorie Phenix1/20/2018, 11:17 AM

## 2016-11-01 DIAGNOSIS — G9340 Encephalopathy, unspecified: Secondary | ICD-10-CM

## 2016-11-01 DIAGNOSIS — F05 Delirium due to known physiological condition: Secondary | ICD-10-CM

## 2016-11-01 LAB — CBC WITH DIFFERENTIAL/PLATELET
Basophils Absolute: 0.1 10*3/uL (ref 0.0–0.1)
Basophils Relative: 1 %
EOS ABS: 0.1 10*3/uL (ref 0.0–0.7)
EOS PCT: 1 %
HCT: 39.1 % (ref 39.0–52.0)
Hemoglobin: 12.7 g/dL — ABNORMAL LOW (ref 13.0–17.0)
LYMPHS ABS: 1.3 10*3/uL (ref 0.7–4.0)
Lymphocytes Relative: 15 %
MCH: 25.9 pg — AB (ref 26.0–34.0)
MCHC: 32.5 g/dL (ref 30.0–36.0)
MCV: 79.6 fL (ref 78.0–100.0)
MONOS PCT: 6 %
Monocytes Absolute: 0.5 10*3/uL (ref 0.1–1.0)
Neutro Abs: 6.7 10*3/uL (ref 1.7–7.7)
Neutrophils Relative %: 77 %
PLATELETS: 258 10*3/uL (ref 150–400)
RBC: 4.91 MIL/uL (ref 4.22–5.81)
RDW: 13.6 % (ref 11.5–15.5)
WBC: 8.6 10*3/uL (ref 4.0–10.5)

## 2016-11-01 LAB — COMPREHENSIVE METABOLIC PANEL
ALT: 13 U/L — AB (ref 17–63)
ANION GAP: 9 (ref 5–15)
AST: 17 U/L (ref 15–41)
Albumin: 3.6 g/dL (ref 3.5–5.0)
Alkaline Phosphatase: 71 U/L (ref 38–126)
BUN: 18 mg/dL (ref 6–20)
CALCIUM: 9 mg/dL (ref 8.9–10.3)
CHLORIDE: 103 mmol/L (ref 101–111)
CO2: 26 mmol/L (ref 22–32)
CREATININE: 0.7 mg/dL (ref 0.61–1.24)
Glucose, Bld: 166 mg/dL — ABNORMAL HIGH (ref 65–99)
Potassium: 4.1 mmol/L (ref 3.5–5.1)
SODIUM: 138 mmol/L (ref 135–145)
Total Bilirubin: 0.4 mg/dL (ref 0.3–1.2)
Total Protein: 6.8 g/dL (ref 6.5–8.1)

## 2016-11-01 LAB — GLUCOSE, CAPILLARY
GLUCOSE-CAPILLARY: 200 mg/dL — AB (ref 65–99)
GLUCOSE-CAPILLARY: 194 mg/dL — AB (ref 65–99)
GLUCOSE-CAPILLARY: 133 mg/dL — AB (ref 65–99)
Glucose-Capillary: 150 mg/dL — ABNORMAL HIGH (ref 65–99)

## 2016-11-01 LAB — URINALYSIS, ROUTINE W REFLEX MICROSCOPIC
BILIRUBIN URINE: NEGATIVE
Glucose, UA: NEGATIVE mg/dL
Hgb urine dipstick: NEGATIVE
KETONES UR: NEGATIVE mg/dL
LEUKOCYTES UA: NEGATIVE
NITRITE: NEGATIVE
Protein, ur: NEGATIVE mg/dL
SPECIFIC GRAVITY, URINE: 1.011 (ref 1.005–1.030)
pH: 6 (ref 5.0–8.0)

## 2016-11-01 MED ORDER — QUETIAPINE FUMARATE 25 MG PO TABS: 25.0000 mg | ORAL_TABLET | Freq: Every evening | ORAL | Status: DC | PRN

## 2016-11-01 NOTE — Progress Notes (Signed)
[facility]     PROGRESS NOTE    Subjective/Complaints: Pt feels that therapy didn't go well yesterday. Family reports confusion, increased at night. "Saw tv on the ceiling". Concerned that he's having double vision or problems with peripheral vision during these episodes of confusion.   ROS limited by cognitive/behavior  Objective: Vital Signs: Blood pressure (!) 164/77, pulse 82, temperature 98.2 F (36.8 C), temperature source Oral, resp. rate 18, weight 77 kg (169 lb 12.8 oz), SpO2 94 %. No results found. No results for input(s): WBC, HGB, HCT, PLT in the last 72 hours. No results for input(s): NA, K, CL, GLUCOSE, BUN, CREATININE, CALCIUM in the last 72 hours.  Invalid input(s): CO CBG (last 3)   Recent Labs  10/31/16 1656 10/31/16 2033 11/01/16 0632  GLUCAP 133* 120* 150*    Wt Readings from Last 3 Encounters:  10/30/16 77 kg (169 lb 12.8 oz)  10/28/16 74.8 kg (164 lb 14.4 oz)  02/13/16 79.4 kg (175 lb)    Physical Exam:  Constitutional: He appears well-developedand well-nourished.  HENT:  Head: Normocephalicand atraumatic.  Eyes: EOMare normal. Left eye exhibits no discharge.  Neck: Normal range of motion. Neck supple. No JVDpresent. No tracheal deviationpresent. Thyromegalypresent.  Cardiovascular: Exam reveals no gallopand no friction rub.  No murmurheard. Cardiac rate control Respiratory: Effort normaland breath sounds normal. No respiratory distress. He has no wheezes. He has no rales.  GI: Soft. Bowel sounds are normal. He exhibits no distension. There is no tenderness. There is no rebound.  Musculoskeletal: He exhibits no edema.  Skin warm and dry Neurological: He is alert. Could tell me he was in hospital. . Limited insight and awareness. Delays in processing. Follows simple commands inconsistently.   Motor 4+/5 RUE and 4 to 4+/5 RLE. LUE and LLE 4+to 5/5. Sensation diminished trace amount RUE, RLE. +pronator  drift persists Psych: flat, disengaged   Assessment/Plan: 1. Right hemisensory and cognitive deficits secondary to left thalamic infarct which require 3+ hours per day of interdisciplinary therapy in a comprehensive inpatient rehab setting. Physiatrist is providing close team supervision and 24 hour management of active medical problems listed below. Physiatrist and rehab team continue to assess barriers to discharge/monitor patient progress toward functional and medical goals.  Function:  Bathing Bathing position   Position: Shower  Bathing parts Body parts bathed by patient: Right arm, Left arm, Chest, Abdomen, Front perineal area, Right upper leg, Left upper leg, Right lower leg, Left lower leg Body parts bathed by helper: Buttocks, Back  Bathing assist Assist Level: Touching or steadying assistance(Pt > 75%)      Upper Body Dressing/Undressing Upper body dressing   What is the patient wearing?: Pull over shirt/dress     Pull over shirt/dress - Perfomed by patient: Thread/unthread right sleeve, Thread/unthread left sleeve, Put head through opening, Pull shirt over trunk          Upper body assist Assist Level: Set up, Supervision or verbal cues      Lower Body Dressing/Undressing Lower body dressing   What is the patient wearing?: Underwear, Pants, [employer], Socks, Software engineer - Performed by patient: Thread/unthread right underwear leg, Thread/unthread left underwear leg, Pull underwear up/down   Pants- Performed by patient: Thread/unthread right pants leg, Thread/unthread left pants leg, Pull pants up/down, Fasten/unfasten pants       Socks - Performed by patient: Don/doff right sock, Don/doff left sock   Shoes - Performed by patient: Don/doff right shoe, Don/doff left  shoe         TED Hose - Performed by helper: Don/doff right TED hose, Don/doff left TED hose  Lower body assist Assist for lower body dressing: Supervision or verbal cues, Touching or steadying  assistance (Pt > 75%)      Toileting Toileting   Toileting steps completed by patient: Adjust clothing prior to toileting, Performs perineal hygiene, Adjust clothing after toileting   Toileting Assistive Devices: Grab bar or rail  Toileting assist Assist level: Supervision or verbal cues   Transfers Chair/bed transfer   Chair/bed transfer method: Stand pivot Chair/bed transfer assist level: Touching or steadying assistance (Pt > 75%) Chair/bed transfer assistive device: Armrests     Locomotion Ambulation     Max distance: 266ft  Assist level: Touching or steadying assistance (Pt > 75%)   Wheelchair   Type: Manual Max wheelchair distance: 137ft  Assist Level: Supervision or verbal cues  Cognition Comprehension Comprehension assist level: Understands basic 50 - 74% of the time/ requires cueing 25 - 49% of the time  Expression Expression assist level: Expresses basic 50 - 74% of the time/requires cueing 25 - 49% of the time. Needs to repeat parts of sentences.  Social Interaction Social Interaction assist level: Interacts appropriately 50 - 74% of the time - May be physically or verbally inappropriate.  Problem Solving Problem solving assist level: Solves basic 50 - 74% of the time/requires cueing 25 - 49% of the time  Memory Memory assist level: Recognizes or recalls 25 - 49% of the time/requires cueing 50 - 75% of the time   Medical Problem List and Plan: 1. Right hemisensory deficits/cognitive deficitssecondary to left thalamic infarct -continue CIR therapies  -pt with ongoing cognitive deficits. Mood is playing a role, too as noted below. Had another lengthy discussion with family regarding his stroke and expected course. I discussed the fact that he shouldn't be experiencing visual sequelae from this stroke and that any visual symptoms may be cognitively based, perhaps hallucinations due to his confusion. Daughter-n-law/family aware that his presentation has been  consistent with "sundowning" at times  -will check for metabolic,infectious sources for confusion---including cmet,cbc,ua/ucx  -will work on re-establishing normal sleep cycle--will have seroquel prn available---won't schedule for now  2. DVT Prophylaxis/Anticoagulation: Eliquis. 3. Pain Management: Tylenol as needed 4. Mood:  Had an extensive discussion with daughter-n-law regarding his clinical picture including mood. She reports that they have been concerned about depression prior to the stroke. Now they feel it's worse. I reviewed the fact that I have concerns about depression also. We spoke about the potential of using an antidepressant. Daughter feels that we would have to tell him the "medicine is for something else", because he would likely refuse an antidepressant. She is in agreement of potentially starting him on something  -for now will hold off on initiation of antidepressant  -will consult neuropsychology for cognitive/behavior evaulation/ family education 5. Neuropsych: This patient isnot capable of making decisions on hisown behalf. 6. Skin/Wound Care: Routine skin checks 7. Fluids/Electrolytes/Nutrition: Routine I&O with follow-up chemistries during admit   8.CAD status post CABG. Continue aspirin 9.PAF. Maintain on Eliquis. Continue Imdur 15 mg daily 10.Hyperlipidemia. Pravachol 11. Hyperglycemia/DM 2--fair control at present           -pt on glucotrol at home--I have resumed this at 2.5mg  daily           -ssi           -CM diet  LOS (Days) 2 A FACE TO  FACE EVALUATION WAS PERFORMED  Over 35 minutes spent today in direct patient contact and counseling regarding mood/depression/cognion as noted above  [name], MD 11/01/2016 8:34 AM

## 2016-11-02 ENCOUNTER — Inpatient Hospital Stay (HOSPITAL_COMMUNITY): Payer: Medicare Other | Admitting: Physical Therapy

## 2016-11-02 ENCOUNTER — Inpatient Hospital Stay (HOSPITAL_COMMUNITY): Payer: Medicare Other

## 2016-11-02 ENCOUNTER — Inpatient Hospital Stay (HOSPITAL_COMMUNITY): Payer: Medicare Other | Admitting: Speech Pathology

## 2016-11-02 ENCOUNTER — Inpatient Hospital Stay (HOSPITAL_COMMUNITY): Payer: Medicare Other | Admitting: Occupational Therapy

## 2016-11-02 DIAGNOSIS — G8191 Hemiplegia, unspecified affecting right dominant side: Secondary | ICD-10-CM

## 2016-11-02 DIAGNOSIS — I69398 Other sequelae of cerebral infarction: Secondary | ICD-10-CM

## 2016-11-02 DIAGNOSIS — R269 Unspecified abnormalities of gait and mobility: Secondary | ICD-10-CM

## 2016-11-02 DIAGNOSIS — I69319 Unspecified symptoms and signs involving cognitive functions following cerebral infarction: Secondary | ICD-10-CM

## 2016-11-02 LAB — URINE CULTURE: CULTURE: NO GROWTH

## 2016-11-02 LAB — GLUCOSE, CAPILLARY
GLUCOSE-CAPILLARY: 152 mg/dL — AB (ref 65–99)
GLUCOSE-CAPILLARY: 127 mg/dL — AB (ref 65–99)
Glucose-Capillary: 142 mg/dL — ABNORMAL HIGH (ref 65–99)
Glucose-Capillary: 170 mg/dL — ABNORMAL HIGH (ref 65–99)

## 2016-11-02 NOTE — Progress Notes (Addendum)
[place] PHYSICAL MEDICINE & REHABILITATION     PROGRESS NOTE    Subjective/Complaints:  Some intermittent confusion  Seemes to be per son when he is fatigued  ROS limited by cognitive/behavior but denies pain or breathing issues  Objective: Vital Signs: Blood pressure (!) 138/52, pulse 66, temperature 98 F (36.7 C), temperature source Oral, resp. rate 18, weight 77 kg (169 lb 12.8 oz), SpO2 99 %. No results found.  Recent Labs  11/01/16 0851  WBC 8.6  HGB 12.7*  HCT 39.1  PLT 258    Recent Labs  11/01/16 0851  NA 138  K 4.1  CL 103  GLUCOSE 166*  BUN 18  CREATININE 0.70  CALCIUM 9.0   CBG (last 3)   Recent Labs  11/01/16 1709 11/01/16 2042 11/02/16 0639  GLUCAP 194* 133* 142*    Wt Readings from Last 3 Encounters:  10/30/16 77 kg (169 lb 12.8 oz)  10/28/16 74.8 kg (164 lb 14.4 oz)  02/13/16 79.4 kg (175 lb)    Physical Exam:  Constitutional: He appears well-developedand well-nourished.  HENT:  Head: Normocephalicand atraumatic.  Eyes: EOMare normal. Left eye exhibits no discharge.  Neck: Normal range of motion. Neck supple. No JVDpresent. No tracheal deviationpresent. Thyromegalypresent.  Cardiovascular: Exam reveals no gallopand no friction rub.  No murmurheard. Cardiac rate control Respiratory: Effort normaland breath sounds normal. No respiratory distress. He has no wheezes. He has no rales.  GI: Soft. Bowel sounds are normal. He exhibits no distension. There is no tenderness. There is no rebound.  Musculoskeletal: He exhibits no edema.  Skin warm and dry Neurological: He is alert. Could tell me he was in hospital. . Limited insight and awareness. Delays in processing. Follows simple commands inconsistently.   Motor 4+/5 RUE and 4 to 4+/5 RLE. LUE and LLE 4+to 5/5. Sensation diminished trace amount RUE, RLE. +pronator drift persists Psych: flat, disengaged   Assessment/Plan: 1. Right hemisensory and cognitive deficits  secondary to left thalamic infarct which require 3+ hours per day of interdisciplinary therapy in a comprehensive inpatient rehab setting. Physiatrist is providing close team supervision and 24 hour management of active medical problems listed below. Physiatrist and rehab team continue to assess barriers to discharge/monitor patient progress toward functional and medical goals.  Function:  Bathing Bathing position   Position: Shower  Bathing parts Body parts bathed by patient: Right arm, Left arm, Chest, Abdomen, Front perineal area, Right upper leg, Left upper leg, Right lower leg, Left lower leg Body parts bathed by helper: Buttocks, Back  Bathing assist Assist Level: Touching or steadying assistance(Pt > 75%)      Upper Body Dressing/Undressing Upper body dressing   What is the patient wearing?: Pull over shirt/dress     Pull over shirt/dress - Perfomed by patient: Thread/unthread right sleeve, Thread/unthread left sleeve, Put head through opening, Pull shirt over trunk          Upper body assist Assist Level: Set up, Supervision or verbal cues      Lower Body Dressing/Undressing Lower body dressing   What is the patient wearing?: Underwear, Pants, [employer], Socks, Software engineer - Performed by patient: Thread/unthread right underwear leg, Thread/unthread left underwear leg, Pull underwear up/down   Pants- Performed by patient: Thread/unthread right pants leg, Thread/unthread left pants leg, Pull pants up/down, Fasten/unfasten pants       Socks - Performed by patient: Don/doff right sock, Don/doff left sock   Shoes - Performed by patient: Don/doff right shoe, Don/doff left  shoe         TED Hose - Performed by helper: Don/doff right TED hose, Don/doff left TED hose  Lower body assist Assist for lower body dressing: Supervision or verbal cues, Touching or steadying assistance (Pt > 75%)      Toileting Toileting   Toileting steps completed by patient: Performs  perineal hygiene Toileting steps completed by helper: Adjust clothing prior to toileting, Adjust clothing after toileting Toileting Assistive Devices: Grab bar or rail  Toileting assist Assist level: Touching or steadying assistance (Pt.75%)   Transfers Chair/bed transfer   Chair/bed transfer method: Stand pivot Chair/bed transfer assist level: Touching or steadying assistance (Pt > 75%) Chair/bed transfer assistive device: Armrests     Locomotion Ambulation     Max distance: 277ft  Assist level: Touching or steadying assistance (Pt > 75%)   Wheelchair   Type: Manual Max wheelchair distance: 172ft  Assist Level: Supervision or verbal cues  Cognition Comprehension Comprehension assist level: Understands basic 50 - 74% of the time/ requires cueing 25 - 49% of the time  Expression Expression assist level: Expresses basic 90% of the time/requires cueing < 10% of the time.  Social Interaction Social Interaction assist level: Interacts appropriately 50 - 74% of the time - May be physically or verbally inappropriate.  Problem Solving Problem solving assist level: Solves basic 50 - 74% of the time/requires cueing 25 - 49% of the time  Memory Memory assist level: Recognizes or recalls 25 - 49% of the time/requires cueing 50 - 75% of the time   Medical Problem List and Plan: 1. Right hemisensory deficits/cognitive deficitssecondary to left thalamic infarct -continue CIR therapies  -Small chronic L cerebellar infarct noted by radiologist but I didn't see it on the images, not seen in May 2017, prior MRI  - cmet,cbc,ua/ucx are all normal  -will work on re-establishing normal sleep cycle--slept ok last noc , a little restless 2. DVT Prophylaxis/Anticoagulation: Eliquis. 3. Pain Management: Tylenol as needed 4. Mood:  Hold off on meds for now -will consult neuropsychology for cognitive/behavior evaulation/ family education 5. Neuropsych: This patient isnot capable of making  decisions on hisown behalf. 6. Skin/Wound Care: Routine skin checks 7. Fluids/Electrolytes/Nutrition: Routine I&O with follow-up chemistries during admit   8.CAD status post CABG. Continue aspirin 9.PAF. Maintain on Eliquis. Continue Imdur 15 mg daily 10.Hyperlipidemia. Pravachol 11. Hyperglycemia/DM 2--fair control at present           -pt on glucotrol at home--I have resumed this at 2.5mg  daily           -ssi           -CM diet CBG (last 3)   Recent Labs  11/01/16 1709 11/01/16 2042 11/02/16 0639  GLUCAP 194* 133* 142*    LOS (Days) 3 A FACE TO FACE EVALUATION WAS PERFORMED    Charlett Blake, MD 11/02/2016 7:02 AM

## 2016-11-02 NOTE — Progress Notes (Signed)
Physical Therapy Session Note  Patient Details  Name: Jonathan [name] MRN: [id number] Date of Birth: 27-Dec-1928  Today's Date: 11/02/2016 PT Individual Time: 0905-1005 PT Individual Time Calculation (min): 60 min   Short Term Goals: Week 1:  PT Short Term Goal 1 (Week 1): STG=LTG due to ELOS  Skilled Therapeutic Interventions/Progress Updates:    Session incorporating gait training with balance activities. Pt ambulating without assistive device with min guard assist throughout session. Pt maintaining wide base of support and decreased rt foot clearance with fatigue. Incorporating uneven surfaces. Occasional mild loss of balance toward rt side. Pt able to path find from gym to room. Balance activities including narrowing BOS, staggered stance (static), cone toe taps. Pt with poor recognition of deficits and delayed reaction to loss of balance. Son present and supportive throughout session. Pt in room with quick release belt and son present. Call light in reach.   Therapy Documentation Precautions:  Precautions Precautions: Fall Restrictions Weight Bearing Restrictions: No    Pain: Pain Assessment Pain Assessment: No/denies pain  See Function Navigator for Current Functional Status.   Therapy/Group: Individual Therapy  Linard Millers, PT, CSCS 11/02/2016, 10:21 AM

## 2016-11-02 NOTE — Progress Notes (Signed)
Social Work Assessment and Plan Social Work Assessment and Plan  Patient Details  Name: Jonathan [name] MRN: [id number] Date of Birth: 1929-10-07  Today's Date: 11/02/2016  Problem List:  Patient Active Problem List   Diagnosis Date Noted  . Expressive aphasia 10/30/2016  . Thalamic infarct, acute ([place]) 10/30/2016  . Cerebral thrombosis with cerebral infarction 10/29/2016  . Acute CVA (cerebrovascular accident) ([place]) 10/29/2016  . Syncope 10/28/2016  . Long-term (current) use of anticoagulants 10/17/2015  . Typical atrial flutter ([place])   . Diabetes mellitus ([place]) 09/28/2015  . Paroxysmal atrial fibrillation ([facility])   . Coronary atherosclerosis of native coronary artery 07/14/2013  . Mixed hyperlipidemia 07/14/2013  . Hypertensive heart disease without CHF    Past Medical History:  Past Medical History:  Diagnosis Date  . Allergic rhinitis   . BPH (benign prostatic hypertrophy)   . Coronary atherosclerosis of native coronary artery   . Degeneration of lumbar or lumbosacral intervertebral disc   . Diabetes ([facility])    no meds  . Dyslipidemia   . HOH (hard of hearing)   . Hyperlipidemia   . Hypertension   . Right leg weakness   . S/P CABG x 4 2006   LIMA-LAD, SVG-rPDA, sSVG-OM1-OM2  . Stroke ([place])   . Wears glasses    reading   Past Surgical History:  Past Surgical History:  Procedure Laterality Date  . bil carpal tunnel surgery    . CARDIAC CATHETERIZATION N/A 10/01/2015   Procedure: Left Heart Cath and Cors/Grafts Angiography;  Surgeon: Peter M Martinique, MD;  Location: [facility];  Service: Cardiovascular;  Laterality: N/A;  . COLONOSCOPY    . CORONARY ARTERY BYPASS GRAFT  2006    Coronary artery bypass grafting x 4 (left internal mammary  . DUPUYTREN CONTRACTURE RELEASE Left 04/05/2014   Procedure: EXCISION DUPUYTREN LEFT PALM, RING, SMALL, AND THUMB;  Surgeon: Cammie Sickle, MD;  Location: [facility];  Service: Orthopedics;  Laterality: Left;   . EYE SURGERY     both cataracts  . HEMORRHOID SURGERY    . LAMINECTOMY    . ROTATOR CUFF REPAIR    . TONSILLECTOMY    . TRANSURETHRAL RESECTION OF PROSTATE     Social History:  reports that he has quit smoking. He has never used smokeless tobacco. He reports that he does not drink alcohol or use drugs.  Family / Support Systems Marital Status: Married Patient Roles: Spouse, Parent Spouse/Significant Other: Nellie [phone] Children: Robert-son [phone] Other Supports: Friends and church members Anticipated Caregiver: Wife and son Ability/Limitations of Caregiver: wife is 51 yo and has health issues, son lives next door but does work Careers adviser: 24/7 Family Dynamics: Close knit family their son is next door and daughter is out of town but has been in and out to check on pt while in the hospital. Pt wants to be independent like he was prior to admission he does not like depending upon others or burdening them.  Social History Preferred language: English Religion: Non-Denominational Cultural Background: No issues Education: High School Read: Yes Write: Yes Employment Status: Retired Freight forwarder Issues: No issues Guardian/Conservator: none-according to MD pt is not capable of making his own decisions while here. HIs wife is next of kin will look toward her and include son since he is here daily.   Abuse/Neglect Physical Abuse: Denies Verbal Abuse: Denies Sexual Abuse: Denies Exploitation of patient/patient's resources: Denies Self-Neglect: Denies  Emotional Status Pt's affect, behavior adn  adjustment status: Pt is motivated to recover and get back to what he is used to doing. He and wife help each other at home. Pt is not used to relying upon others and he doesn't like it either. He seems to be down and sad about being here in the hospital. Recent Psychosocial Issues: Has been realitively healthy prior to this stroke. Had heart surgery in  2007 Pyschiatric History: No history deferred depression screen due to tired and not feeling like it. He does appears down and sad with his expressions and answers to questions. Will sign up for neuro-psych to see while here and hopefully as makes progress he will see how well he is doing in therapies and his recovery. Substance Abuse History: No issues  Patient / Family Perceptions, Expectations & Goals Pt/Family understanding of illness & functional limitations: Pt and son have a good understanding of his stroke. Son can see his progress and is trying to encourage Dad with how well he has done in the past few days. Both do talk with the MD and feel their questions have been answered while here. Premorbid pt/family roles/activities: Husband, father, grandfather, retiree, church member, Health visitor, etc Anticipated changes in roles/activities/participation: resume Pt/family expectations/goals: Pt states: " I want to do for myself, not used to having others do for me, don't like it."  Son states: " He is doing well he doesn't realize how well."  [employer]: None Premorbid Home Care/DME Agencies: Other (Comment) (had in the past) Transportation available at discharge: Son can provide transportaiton, pt was driving prior to admission Resource referrals recommended: Neuropsychology, Support group (specify)  Discharge Planning Living Arrangements: Spouse/significant other Support Systems: Spouse/significant other, Children, Friends/neighbors, Church/faith community Type of Residence: Private residence Insurance Resources: Multimedia programmer (specify) Forensic psychologist Medicare) Museum/gallery curator Resources: Fish farm manager, Family Support Financial Screen Referred: No Living Expenses: Lives with family Money Management: Patient, Spouse Does the patient have any problems obtaining your medications?: No Home Management: Wife and pt help one another Patient/Family Preliminary Plans: Return  home with wife who can provide supervision level, due to her own health issues. She actually needs a medical procedure-esophagus dilation which is being re-scheduled due to weather last week. Pt wants to be there with her, but understands if he can't. Social Work Anticipated Follow Up Needs: HH/OP, Support Group  Clinical Impression Pleasant HOH gentleman who is working hard in therapies, but somewhat discouraged he is not doing better than he is. Son is supportive and here with him to observe him in therapies today. His wife has health issues of her own and can Only provide supervision level. Awaiting therapy team evaluations and will develop a safe discharge plan for pt. He is recovering quickly and hopefully will be at a level where wife can manage him. Will make referral for neuro-psych to see him while here-will probably be short length of stay.  Elease Hashimoto 11/02/2016, 2:39 PM

## 2016-11-02 NOTE — Progress Notes (Signed)
Responded to call light and found patient had already walked to the bathroom despite effort of family members to call for assistance first. Educated patient for need to call staff but no evidence of agreement. Please consider training family members.

## 2016-11-02 NOTE — Care Management Note (Signed)
Inpatient Rehabilitation Center Individual Statement of Services  Patient Name:  Jonathan [name]  Date:  11/02/2016  Welcome to the [facility].  Our goal is to provide you with an individualized program based on your diagnosis and situation, designed to meet your specific needs.  With this comprehensive rehabilitation program, you will be expected to participate in at least 3 hours of rehabilitation therapies Monday-Friday, with modified therapy programming on the weekends.  Your rehabilitation program will include the following services:  Physical Therapy (PT), Occupational Therapy (OT), Speech Therapy (ST), 24 hour per day rehabilitation nursing, Neuropsychology, Case Management (Social Worker), Rehabilitation Medicine, Nutrition Services and Pharmacy Services  Weekly team conferences will be held on Wednesday to discuss your progress.  Your Social Worker will talk with you frequently to get your input and to update you on team discussions.  Team conferences with you and your family in attendance may also be held.  Expected length of stay: 7-10 days  Overall anticipated outcome: supervision with set up ambulation and mod/i-OT goals  Depending on your progress and recovery, your program may change. Your Social Worker will coordinate services and will keep you informed of any changes. Your Social Worker's name and contact numbers are listed  below.  The following services may also be recommended but are not provided by the [facility] will be made to provide these services after discharge if needed.  Arrangements include referral to agencies that provide these services.  Your insurance has been verified to be:  [employer] Your primary doctor is:  Lavone Orn  Pertinent information will be shared with your doctor and your insurance  company.  Social Worker:  Ovidio Kin, [place] or [phone]  Information discussed with and copy given to patient by: Elease Hashimoto, 11/02/2016, 2:47 PM

## 2016-11-02 NOTE — Progress Notes (Signed)
Speech Language Pathology Daily Session Note  Patient Details  Name: Jonathan [name] MRN: [id number] Date of Birth: 1929/01/03  Today's Date: 11/02/2016 SLP Individual Time: [id number] SLP Individual Time Calculation (min): 35 min  Short Term Goals: Week 1: SLP Short Term Goal 1 (Week 1): Pt will recognize and correct verbal errors during functional tasks with mod assist multimodal cues.   SLP Short Term Goal 2 (Week 1): Pt will utilize compensatory word finding strategies during structured and unstructured language tasks with min assist verbal cues.   SLP Short Term Goal 3 (Week 1): Pt will follow multi-unit commands during functional tasks with min assist multimodal cues.   SLP Short Term Goal 4 (Week 1): Pt will sustain his attention to tasks for 3-5 minutes with min verbal cues for redirection.   SLP Short Term Goal 5 (Week 1): Pt will utilize memory compensatory strategies to facilitate recall of daily information with mod assist multimodal cues.    Skilled Therapeutic Interventions: Skilled treatment session focused on addressing cognitive-linguistic goals. Upon SLP arrival patient and family discussing current situation and deficits.  Son reports that patient has been unable to consistently recall he had a stroke nor is he consistently aware of deficits.  As a result, session focused on increasing recall of current events/situation as well as intellectual awareness of deficits.  SLP also facilitated session by providing Max assist verbal and non-verbal cues for awareness of paraphasic errors; patient required Mod assist choice of two cues to correct.  Recommend to continue with current plan of care for now; however, if recall and awareness continue to be limiting factors care plan may need to be modified.      Function:  Cognition Comprehension Comprehension assist level: Understands basic 75 - 89% of the time/ requires cueing 10 - 24% of the time  Expression   Expression assist level:  Expresses basic 50 - 74% of the time/requires cueing 25 - 49% of the time. Needs to repeat parts of sentences.  Social Interaction Social Interaction assist level: Interacts appropriately 90% of the time - Needs monitoring or encouragement for participation or interaction.  Problem Solving Problem solving assist level: Solves basic 50 - 74% of the time/requires cueing 25 - 49% of the time  Memory Memory assist level: Recognizes or recalls 25 - 49% of the time/requires cueing 50 - 75% of the time    Pain Pain Assessment Pain Assessment: No/denies pain  Therapy/Group: Individual Therapy  Carmelia Roller., CCC-SLP [id number]  [place] 11/02/2016, 4:26 PM

## 2016-11-02 NOTE — Progress Notes (Signed)
Patient information reviewed and entered into eRehab system by Oriana Horiuchi, RN, CRRN, PPS Coordinator.  Information including medical coding and functional independence measure will be reviewed and updated through discharge.     Per nursing patient was given "Data Collection Information Summary for Patients in Inpatient Rehabilitation Facilities with attached "Privacy Act Statement-Health Care Records" upon admission.  

## 2016-11-02 NOTE — Progress Notes (Signed)
Physical Therapy Session Note  Patient Details  Name: Jonathan [name] MRN: [id number] Date of Birth: 1929/09/15  Today's Date: 11/02/2016 PT Individual Time: 1330-1400 PT Individual Time Calculation (min): 30 min   Short Term Goals: Week 1:  PT Short Term Goal 1 (Week 1): STG=LTG due to ELOS  Skilled Therapeutic Interventions/Progress Updates:    Session focused on neuro re-ed to address balance re-training, postural control, weightshifting (R <> L and anterior <> posterior), and single limb stance during functional and dynamic gait activities and on Biodex using weightshifting and random control programs (compliant and non-compliant surfaces). Pt requires min assist overall during transfers and gait without AD with cues for R foot clearance and upright posture. During dynamic balance challenges, required up to mod assist. Decreased ability to weightshift to R and anteriorly on [employer] activities noted.   Therapy Documentation Precautions:  Precautions Precautions: Fall Restrictions Weight Bearing Restrictions: No    Pain:  Denies pain.   See Function Navigator for Current Functional Status.   Therapy/Group: Individual Therapy  Canary Brim Ivory Broad, PT, DPT  11/02/2016, 2:25 PM

## 2016-11-02 NOTE — Progress Notes (Signed)
Occupational Therapy Session Note  Patient Details  Name: Jonathan [name] MRN: [id number] Date of Birth: November 06, 1928  Today's Date: 11/02/2016 OT Individual Time: 1402-1500 OT Individual Time Calculation (min): 58 min   Short Term Goals: Week 1:  OT Short Term Goal 1 (Week 1): Groom standing at sink with setup and standby assist OT Short Term Goal 2 (Week 1): Complete bathing using DME with min assist for dynamic standing balance. OT Short Term Goal 3 (Week 1): Complete shower transfers with min assist for safe techique and hand placement OT Short Term Goal 4 (Week 1): Demo improved memory and sequencing as evidenced by ability to provide setup direction to caregiver for dressing with min questioning cues     Skilled Therapeutic Interventions/Progress Updates: Skilled OT session completed with focus on dynamic standing balance, cognition, functional ambulation, and bilateral [place]. Pt was received via PT handoff with family present. Pt agreeable to showering as he had not had one today. He ambulated to his room with Min A and no AD, min cues for pathfinding. Pt ambulated to shower bench and completed transfer with Min A. Bathing completed with overall steady assist for pericare in standing with grab bars. Afterwards pt ambulated to room and completed dressing w/c level. He completed with overall min guard, able to don left TED stocking with instruction on technique (for [facility] remediation). Extra time required for fastening pants and buttoning shirt buttons, pt cued to sit to complete these tasks due to increased right lean when completing in standing. At end of session pt was left with all needs within reach and family present.      Therapy Documentation Precautions:  Precautions Precautions: Fall Restrictions Weight Bearing Restrictions: No General:   Vital Signs: Therapy Vitals Temp: 98.6 F (37 C) Temp Source: Oral Pulse Rate: 87 Resp: 17 BP: 130/82 Patient Position (if appropriate):  Sitting Oxygen Therapy SpO2: 100 % O2 Device: Not Delivered Pain: No c/o pain during session    ADL: ADL ADL Comments: see Functional Assessment Tool:    See Function Navigator for Current Functional Status.   Therapy/Group: Individual Therapy  Boby Eyer A Anayla Giannetti 11/02/2016, 4:21 PM

## 2016-11-02 NOTE — Progress Notes (Signed)
Physical Therapy Session Note  Patient Details  Name: Jonathan [name] MRN: [id number] Date of Birth: 13-Jun-1929  Today's Date: 11/02/2016 PT Individual Time: 1500-1530 PT Individual Time Calculation (min): 30 min   Short Term Goals: Week 1:  PT Short Term Goal 1 (Week 1): STG=LTG due to ELOS  Skilled Therapeutic Interventions/Progress Updates: Pt presented in chair agreeable to therapy. Ambulated to rehab gym no AD, cues to decrease pace and increase stride length intermittently. Performed standing with recall activities, use of peg board and pipe tree. Pt able to correctly recall patterns 25% of trials. Mod cues while in standing to achieve neutral with pt decreasing wt bearing on LLE with fatigue. Pt ambulated back to room with close supervision/min guard and left in w/c with family present awaiting next therapy session,      Therapy Documentation Precautions:  Precautions Precautions: Fall Restrictions Weight Bearing Restrictions: No   See Function Navigator for Current Functional Status.   Therapy/Group: Individual Therapy  Jashun Puertas  Milyn Stapleton, PTA  11/02/2016, 4:00 PM

## 2016-11-02 NOTE — IPOC Note (Signed)
Overall Plan of Care [facility]) Patient Details Name: Jonathan [name] MRN: [id number] DOB: 22-Oct-1928  Admitting Diagnosis: CVA  Hospital Problems: Active Problems:   Thalamic infarct, acute [facility])     Functional Problem List: Nursing Safety, Sensory, Skin Integrity, Medication Management  PT Balance, Motor, Safety  OT Balance, Cognition, Perception, Safety, Vision  SLP Cognition, Linguistic  TR         Basic ADL's: OT Eating, Grooming, Bathing, Dressing, Toileting     Advanced  ADL's: OT       Transfers: PT Bed Mobility, Bed to Chair, Car, [employer], Futures trader, Metallurgist: PT Ambulation, Emergency planning/management officer, Stairs     Additional Impairments: OT None  SLP Communication, Social Cognition comprehension, expression Attention, Awareness, Memory  TR      Anticipated Outcomes Item Anticipated Outcome  Self Feeding Mod I  Swallowing      Basic self-care  Supervision  Toileting  Supervision   Bathroom Transfers Supervision  Bowel/Bladder  Cont. of Bowel/Bladder  Transfers  mod I - Supervision with LRAD   Locomotion  Ambulatory with supervision with LRAD   Communication  min assist   Cognition     Pain  No pain at this time.  Safety/Judgment  Fall risk. Implement safety percautions.   Therapy Plan: PT Intensity: Minimum of 1-2 x/day ,45 to 90 minutes PT Frequency: 5 out of 7 days PT Duration Estimated Length of Stay: 7-10 days  OT Intensity: Minimum of 1-2 x/day, 45 to 90 minutes OT Frequency: 5 out of 7 days OT Duration/Estimated Length of Stay: 10-14 days SLP Intensity: Minumum of 1-2 x/day, 30 to 90 minutes SLP Frequency: 3 to 5 out of 7 days SLP Duration/Estimated Length of Stay: 10-14 days        Team Interventions: Nursing Interventions Patient/Family Education, Discharge Planning  PT interventions Ambulation/gait training, Training and development officer, Cognitive remediation/compensation, Community reintegration, Discharge  planning, Disease management/prevention, DME/adaptive equipment instruction, Functional mobility training, Neuromuscular re-education, Pain management, Patient/family education, Psychosocial support, Stair training, Therapeutic Exercise, UE/LE Coordination activities, Therapeutic Activities, UE/LE Strength taining/ROM, Visual/perceptual remediation/compensation, Wheelchair propulsion/positioning  OT Interventions Training and development officer, Cognitive remediation/compensation, Discharge planning, Patient/family education, Self Care/advanced ADL retraining, Therapeutic Activities, Therapeutic Exercise, UE/LE Coordination activities, Visual/perceptual remediation/compensation  SLP Interventions Cognitive remediation/compensation, Cueing hierarchy, Functional tasks, Internal/external aids, Patient/family education, Multimodal communication approach, Speech/Language facilitation  TR Interventions    SW/CM Interventions Discharge Planning, Psychosocial Support, Patient/Family Education    Team Discharge Planning: Destination: PT-Home ,OT- Home , SLP-Home Projected Follow-up: PT-Home health PT, OT-  Home health OT, SLP-24 hour supervision/assistance, Home Health SLP, Outpatient SLP Projected Equipment Needs: PT-To be determined, OT- To be determined, SLP-None recommended by SLP Equipment Details: PT- , OT-  Patient/family involved in discharge planning: PT- Patient, Family member/caregiver,  OT-Patient, Family member/caregiver, SLP-Patient, Family member/caregiver  MD ELOS: 10-14d Medical Rehab Prognosis:  Good Assessment:  83 y.o.right handed malewith history of CAD status post CABG 2007, hypertension, PAF on Xareltoin the past. Presented 10/28/2016 with altered mental status right-sided numbness and slurred speech. Per chart review patient lives with her 15 year old wife. Reported to be independent prior to admission still driving.Son lives next door and works during the day.MRI imaging revealed  acute 15 x 11 mm left thalamus infarct. CT angiogram head and neck showed right posterior cerebral artery P1 occlusion. Atherosclerotic type changes. 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 Patient did not receive TPA. Echocardiogram with ejection fraction of [id number] grade 1 diastolic dysfunction. EEG  suggests possibility of mild encephalopathy. Negative for seizure.. Neurology follow-up currently onaspirin and Eliquis for CVA prophylaxis.   Now requiring 24/7 Rehab RN,MD, as well as CIR level PT, OT and SLP.  Treatment team will focus on ADLs and mobility with goals set at Sup  See Team Conference Notes for weekly updates to the plan of care

## 2016-11-02 NOTE — Discharge Instructions (Addendum)
Inpatient Rehab Discharge Instructions  [place] Discharge date and time: No discharge date for patient encounter.   Activities/Precautions/ Functional Status: Activity: activity as tolerated Diet: diabetic diet Wound Care: none needed Functional status:  ___ No restrictions     ___ Walk up steps independently ___ 24/7 supervision/assistance   ___ Walk up steps with assistance ___ Intermittent supervision/assistance  ___ Bathe/dress independently ___ Walk with walker     _x STROKE/TIA DISCHARGE INSTRUCTIONS SMOKING Cigarette smoking nearly doubles your risk of having a stroke & is the single most alterable risk factor  If you smoke or have smoked in the last 12 months, you are advised to quit smoking for your health.  Most of the excess cardiovascular risk related to smoking disappears within a year of stopping.  Ask you doctor about anti-smoking medications  [place] Quit Line: 1-800-QUIT NOW  Free Smoking Cessation Classes [phone]  CHOLESTEROL Know your levels; limit fat & cholesterol in your diet  Lipid Panel     Component Value Date/Time   CHOL 183 10/29/2016 0649   TRIG 161 (H) 10/29/2016 0649   HDL 25 (L) 10/29/2016 0649   CHOLHDL 7.3 10/29/2016 0649   VLDL 32 10/29/2016 0649   LDLCALC 126 (H) 10/29/2016 0649      Many patients benefit from treatment even if their cholesterol is at goal.  Goal: Total Cholesterol (CHOL) less than 160  Goal:  Triglycerides (TRIG) less than 150  Goal:  HDL greater than 40  Goal:  LDL (LDLCALC) less than 100   BLOOD PRESSURE American Stroke Association blood pressure target is less that 120/80 mm/Hg  Your discharge blood pressure is:  BP: (!) 147/69  Monitor your blood pressure  Limit your salt and alcohol intake  Many individuals will require more than one medication for high blood pressure  DIABETES (A1c is a blood sugar average for last 3 months) Goal HGBA1c is under 7% (HBGA1c is blood sugar average for last 3  months)  Diabetes:     Lab Results  Component Value Date   HGBA1C 8.5 (H) 10/29/2016     Your HGBA1c can be lowered with medications, healthy diet, and exercise.  Check your blood sugar as directed by your physician  Call your physician if you experience unexplained or low blood sugars.  PHYSICAL ACTIVITY/REHABILITATION Goal is 30 minutes at least 4 days per week  Activity: Increase activity slowly, Therapies: Physical Therapy: Home Health Return to work:   Activity decreases your risk of heart attack and stroke and makes your heart stronger.  It helps control your weight and blood pressure; helps you relax and can improve your mood.  Participate in a regular exercise program.  Talk with your doctor about the best form of exercise for you (dancing, walking, swimming, cycling).  DIET/WEIGHT Goal is to maintain a healthy weight  Your discharge diet is: Diet Carb Modified Fluid consistency: Thin; Room service appropriate? Yes  liquids Your height is:    Your current weight is: Weight: 77.4 kg (170 lb 9.6 oz) Your Body Mass Index (BMI) is:     Following the type of diet specifically designed for you will help prevent another stroke.  Your goal weight range is:    Your goal Body Mass Index (BMI) is 19-24.  Healthy food habits can help reduce 3 risk factors for stroke:  High cholesterol, hypertension, and excess weight.  RESOURCES Stroke/Support Group:  Call [phone]   STROKE EDUCATION PROVIDED/REVIEWED AND GIVEN TO PATIENT Stroke warning  signs and symptoms How to activate emergency medical system (call 911). Medications prescribed at discharge. Need for follow-up after discharge. Personal risk factors for stroke. Pneumonia vaccine given:  Flu vaccine given:  My questions have been answered, the writing is legible, and I understand these instructions.  I will adhere to these goals & educational materials that have been provided to me after my discharge from the hospital.    __ Bathe/dress with assistance ___ Walk Independently    ___ Shower independently ___ Walk with assistance    ___ Shower with assistance ___ No alcohol     ___ Return to work/school ________  Special Instructions:    COMMUNITY REFERRALS UPON DISCHARGE:    Home Health:   PT, OT, SP,RN  [place]    Date of last service:11/10/2016  Medical Equipment/Items Ordered:NO NEEDS   Other:DR. JOHN RODENBOUGH-NEURO-PSYCHOLOGIST  [phone] CONTACT TO SET UP OUTPATIENT APPOINTMENT  GENERAL COMMUNITY RESOURCES FOR PATIENT/FAMILY: Support Groups:[facility] SUPPORT  EVERY SECOND Thursday @ 3:00-4:00 PM ON THE REHAB UNIT QUESTIONS CONTACT [place] [id number]   My questions have been answered and I understand these instructions. I will adhere to these goals and the provided educational materials after my discharge from the hospital.  Patient/Caregiver Signature _______________________________ Date __________  Clinician Signature _______________________________________ Date __________  Please bring this form and your medication list with you to all your follow-up doctor's appointments. Information on my medicine - ELIQUIS (apixaban)  This medication education was reviewed with me or my healthcare representative as part of my discharge preparation.    Why was Eliquis prescribed for you? Eliquis was prescribed for you to reduce the risk of a blood clot forming that can cause a stroke if you have a medical condition called atrial fibrillation (a type of irregular heartbeat).  What do You need to know about Eliquis ? Take your Eliquis 5mg  TWICE DAILY - one tablet in the morning and one tablet in the evening with or without food. If you have difficulty swallowing the tablet whole please discuss with your pharmacist how to take the medication safely.  Take Eliquis exactly as prescribed by your doctor and DO NOT stop taking Eliquis without talking to the  doctor who prescribed the medication.  Stopping may increase your risk of developing a stroke.  Refill your prescription before you run out.  After discharge, you should have regular check-up appointments with your healthcare provider that is prescribing your Eliquis.  In the future your dose may need to be changed if your kidney function or weight changes by a significant amount or as you get older.  What do you do if you miss a dose? If you miss a dose, take it as soon as you remember on the same day and resume taking twice daily.  Do not take more than one dose of ELIQUIS at the same time to make up a missed dose.  Important Safety Information A possible side effect of Eliquis is bleeding. You should call your healthcare provider right away if you experience any of the following: ? Bleeding from an injury or your nose that does not stop. ? Unusual colored urine (red or dark brown) or unusual colored stools (red or black). ? Unusual bruising for unknown reasons. ? A serious fall or if you hit your head (even if there is no bleeding).  Some medicines may interact with Eliquis and might increase your risk of bleeding or clotting while on Eliquis. To help avoid this, consult  your healthcare provider or pharmacist prior to using any new prescription or non-prescription medications, including herbals, vitamins, non-steroidal anti-inflammatory drugs (NSAIDs) and supplements.  This website has more information on Eliquis (apixaban): http://[email or web address].com/eliquis/home

## 2016-11-03 ENCOUNTER — Inpatient Hospital Stay (HOSPITAL_COMMUNITY): Payer: Medicare Other | Admitting: Occupational Therapy

## 2016-11-03 ENCOUNTER — Inpatient Hospital Stay (HOSPITAL_COMMUNITY): Payer: Medicare Other | Admitting: Speech Pathology

## 2016-11-03 ENCOUNTER — Inpatient Hospital Stay (HOSPITAL_COMMUNITY): Payer: Medicare Other | Admitting: Physical Therapy

## 2016-11-03 LAB — GLUCOSE, CAPILLARY
GLUCOSE-CAPILLARY: 129 mg/dL — AB (ref 65–99)
GLUCOSE-CAPILLARY: 177 mg/dL — AB (ref 65–99)
GLUCOSE-CAPILLARY: 142 mg/dL — AB (ref 65–99)
Glucose-Capillary: 187 mg/dL — ABNORMAL HIGH (ref 65–99)

## 2016-11-03 NOTE — Progress Notes (Signed)
Occupational Therapy Session Note  Patient Details  Name: Jonathan [name] MRN: [id number] Date of Birth: 1929/01/05  Today's Date: 11/03/2016 OT Individual Time: [id number] OT Individual Time Calculation (min): 56 min    Short Term Goals: Week 1:  OT Short Term Goal 1 (Week 1): Groom standing at sink with setup and standby assist OT Short Term Goal 2 (Week 1): Complete bathing using DME with min assist for dynamic standing balance. OT Short Term Goal 3 (Week 1): Complete shower transfers with min assist for safe techique and hand placement OT Short Term Goal 4 (Week 1): Demo improved memory and sequencing as evidenced by ability to provide setup direction to caregiver for dressing with min questioning cues  Skilled Therapeutic Interventions/Progress Updates:    Treatment session with focus on bathroom transfers and visual assessment.  Pt in bed upon arrival with son present for support and education.  Educated pt's son on safety with mobility and checked son off to transfer pt to/from toilet as pt is impulsive and has not been waiting for nursing staff assistance with toileting.  [name] able to provide min assist - min guard and cues for sequencing as needed with transfers.  Attempted to discuss pt goals and increase awareness of deficits with no evidence of learning and pt demonstrating circumlocution when providing answers.  Ambulated > 300 feet with min guard and min cues to attend to RLE and Rt environment as pt fatigued.  Engaged in further visual assessment with pt demonstrating difficulty following directions with additional eye movements during assessment.  Utilized [employer] in standing with min guard for standing balance.  Reaction time 3.43 and 2.79 seconds in initial two trials.  Pt demonstrating decreased visual attention, scanning to Rt visual field lower quadrant > upper quadrant.  Question if just lower quadrant deficit, which might explain increased difficulty with vision when  lying in bed vs upright.  Returned to room and left seated EOB with son present.   Therapy Documentation Precautions:  Precautions Precautions: Fall Restrictions Weight Bearing Restrictions: No Pain:  Pt with no c/o pain  See Function Navigator for Current Functional Status.   Therapy/Group: Individual Therapy  Simonne Come 11/03/2016, 3:20 PM

## 2016-11-03 NOTE — Progress Notes (Signed)
Physical Therapy Session Note  Patient Details  Name: Jonathan [name] MRN: [id number] Date of Birth: June 08, 1929  Today's Date: 11/03/2016 PT Individual Time: 1400-1500 PT Individual Time Calculation (min): 60 min   Short Term Goals: Week 1:  PT Short Term Goal 1 (Week 1): STG=LTG due to ELOS  Skilled Therapeutic Interventions/Progress Updates:    No c/o pain and agreeable to session focus on cognitive tasks, balance, activity tolerance, and NMR.    Pt transitions supine>sit>stand with supervision for safety.  Gait to and from therapy gym [phone] with no device and steady assist with bouts of close supervision.    Pt engaged in peg board task in standing on firm and compliant surface focus on balance, activity tolerance, visual perceptual and visual-spatial awareness, visual scanning, problem solving, and sustained attention to task.  Pt requires close supervision>min assist for balance on foam surface and min cues for not bearing weight through UEs on rolling table.  Pt requires initial max assist to recreate patterns with colored pegs, fade to supervision with practice.    NMR for trunk activation, equal LE activation, and balance 2x15 bridges with adductor hold, 2x10 trunk rotation in hook lying.  Dynamic sitting balance and core strengthening reaching outside BOS and across midline for cup stacking task, no LE support for increased challenge.  Nustep x10 minutes at level 4 for reciprocal stepping pattern retraining and cardiopulmonary endurance, focus on pt awareness of steps/minute and time.  Pt requires min cues to stay attended to time and cease activity at predetermined 8 minutes.    Pt returned to room at end of session and positioned seated EOB with family present, call bell in reach and needs met.   Therapy Documentation Precautions:  Precautions Precautions: Fall Restrictions Weight Bearing Restrictions: No   See Function Navigator for Current Functional  Status.   Therapy/Group: Individual Therapy  Earnest Conroy Penven-Crew 11/03/2016, 3:56 PM

## 2016-11-03 NOTE — Progress Notes (Signed)
Speech Language Pathology Daily Session Note  Patient Details  Name: Jonathan [name] MRN: [id number] Date of Birth: [birth year]  Today's Date: 11/03/2016 SLP Individual Time: [id number] SLP Individual Time Calculation (min): 35 min  Short Term Goals: Week 1: SLP Short Term Goal 1 (Week 1): Pt will recognize and correct verbal errors during functional tasks with mod assist multimodal cues.   SLP Short Term Goal 2 (Week 1): Pt will utilize compensatory word finding strategies during structured and unstructured language tasks with min assist verbal cues.   SLP Short Term Goal 3 (Week 1): Pt will follow multi-unit commands during functional tasks with min assist multimodal cues.   SLP Short Term Goal 4 (Week 1): Pt will sustain his attention to tasks for 3-5 minutes with min verbal cues for redirection.   SLP Short Term Goal 5 (Week 1): Pt will utilize memory compensatory strategies to facilitate recall of daily information with mod assist multimodal cues.    Skilled Therapeutic Interventions: Skilled treatment session focused on functional communication. SLP facilitated session by providing extra time and Mod A multimodal cues for patient to compare/contrast between 2 functional items at the sentence level. Patient utilized Psychologist, prison and probation services throughout task with Max A multimodal cues for emergent awareness of difficulty of task. Patient was 100% accurate with naming functional items. Patient demonstrated sustained attention to task for ~10 minutes with Min A verbal cues for redirection. Patient left upright in wheelchair with all needs within reach. Continue with current plan of care.    Function:   Cognition Comprehension Comprehension assist level: Understands basic 90% of the time/cues < 10% of the time  Expression   Expression assist level: Expresses basic 75 - 89% of the time/requires cueing 10 - 24% of the time. Needs helper to occlude trach/needs to repeat words.  Social Interaction Social  Interaction assist level: Interacts appropriately 75 - 89% of the time - Needs redirection for appropriate language or to initiate interaction.  Problem Solving Problem solving assist level: Solves basic 50 - 74% of the time/requires cueing 25 - 49% of the time  Memory Memory assist level: Recognizes or recalls 50 - 74% of the time/requires cueing 25 - 49% of the time    Pain No/Denies Pain   Therapy/Group: Individual Therapy  Blimi Godby 11/03/2016, 3:30 PM

## 2016-11-03 NOTE — Progress Notes (Signed)
[facility] PHYSICAL MEDICINE & REHABILITATION     PROGRESS NOTE    Subjective/Complaints: Pt sitting and eating breakfast recognizes me as MD  ROS limited by cognitive/behavior but denies pain or breathing issues  Objective: Vital Signs: Blood pressure (!) 165/70, pulse 74, temperature 98.2 F (36.8 C), temperature source Oral, resp. rate 16, weight 77 kg (169 lb 12.8 oz), SpO2 100 %. No results found.  Recent Labs  11/01/16 0851  WBC 8.6  HGB 12.7*  HCT 39.1  PLT 258    Recent Labs  11/01/16 0851  NA 138  K 4.1  CL 103  GLUCOSE 166*  BUN 18  CREATININE 0.70  CALCIUM 9.0   CBG (last 3)   Recent Labs  11/02/16 1658 11/02/16 2138 11/03/16 0616  GLUCAP 127* 170* 129*    Wt Readings from Last 3 Encounters:  10/30/16 77 kg (169 lb 12.8 oz)  10/28/16 74.8 kg (164 lb 14.4 oz)  02/13/16 79.4 kg (175 lb)    Physical Exam:  Constitutional: He appears well-developedand well-nourished.  HENT:  Head: Normocephalicand atraumatic.  Eyes: EOMare normal. Left eye exhibits no discharge.  Neck: Normal range of motion. Neck supple. No JVDpresent. No tracheal deviationpresent. Thyromegalypresent.  Cardiovascular: Exam reveals no gallopand no friction rub.  No murmurheard. Cardiac rate control Respiratory: Effort normaland breath sounds normal. No respiratory distress. He has no wheezes. He has no rales.  GI: Soft. Bowel sounds are normal. He exhibits no distension. There is no tenderness. There is no rebound.  Musculoskeletal: He exhibits no edema.  Skin warm and dry Neurological: He is alert. Oriented to person , "holiday Haskel Khan", here because of "confusion" . Limited insight and awareness. Delays in processing. Follows simple commands inconsistently.   Motor 4+/5 RUE and 4 to 4+/5 RLE. LUE and LLE 4+to 5/5. Sensation diminished trace amount RUE, RLE. +pronator drift persists on Right  Psych: flat, disengaged   Assessment/Plan: 1. Right hemisensory and  cognitive deficits secondary to left thalamic infarct which require 3+ hours per day of interdisciplinary therapy in a comprehensive inpatient rehab setting. Physiatrist is providing close team supervision and 24 hour management of active medical problems listed below. Physiatrist and rehab team continue to assess barriers to discharge/monitor patient progress toward functional and medical goals.  Function:  Bathing Bathing position   Position: Shower  Bathing parts Body parts bathed by patient: Right arm, Left arm, Chest, Abdomen, Front perineal area, Right upper leg, Left upper leg, Right lower leg, Left lower leg, Buttocks Body parts bathed by helper: Back  Bathing assist Assist Level: Touching or steadying assistance(Pt > 75%)      Upper Body Dressing/Undressing Upper body dressing   What is the patient wearing?: Pull over shirt/dress     Pull over shirt/dress - Perfomed by patient: Thread/unthread right sleeve, Thread/unthread left sleeve, Put head through opening, Pull shirt over trunk          Upper body assist Assist Level: Set up, Supervision or verbal cues      Lower Body Dressing/Undressing Lower body dressing   What is the patient wearing?: Underwear, Pants, Non-skid slipper socks, Ted Hose Underwear - Performed by patient: Thread/unthread right underwear leg, Thread/unthread left underwear leg, Pull underwear up/down   Pants- Performed by patient: Thread/unthread right pants leg, Thread/unthread left pants leg, Pull pants up/down, Fasten/unfasten pants   Non-skid slipper socks- Performed by patient: Don/doff right sock, Don/doff left sock   Socks - Performed by patient: Don/doff right sock, Don/doff left sock  Shoes - Performed by patient: Don/doff right shoe, Don/doff left shoe       TED Hose - Performed by patient: Don/doff left TED hose TED Hose - Performed by helper: Don/doff right TED hose  Lower body assist Assist for lower body dressing: Touching or  steadying assistance (Pt > 75%)      Toileting Toileting   Toileting steps completed by patient: Adjust clothing prior to toileting, Performs perineal hygiene, Adjust clothing after toileting Toileting steps completed by helper: Adjust clothing prior to toileting, Performs perineal hygiene, Adjust clothing after toileting Toileting Assistive Devices: Grab bar or rail  Toileting assist Assist level: Touching or steadying assistance (Pt.75%)   Transfers Chair/bed transfer   Chair/bed transfer method: Ambulatory Chair/bed transfer assist level: Touching or steadying assistance (Pt > 75%) Chair/bed transfer assistive device: Armrests     Locomotion Ambulation     Max distance: 260 ft Assist level: Touching or steadying assistance (Pt > 75%)   Wheelchair   Type: Manual Max wheelchair distance: 16ft  Assist Level: Supervision or verbal cues  Cognition Comprehension Comprehension assist level: Understands basic 90% of the time/cues < 10% of the time  Expression Expression assist level: Expresses basic 90% of the time/requires cueing < 10% of the time.  Social Interaction Social Interaction assist level: Interacts appropriately 90% of the time - Needs monitoring or encouragement for participation or interaction.  Problem Solving Problem solving assist level: Solves basic 90% of the time/requires cueing < 10% of the time  Memory Memory assist level: Recognizes or recalls 90% of the time/requires cueing < 10% of the time   Medical Problem List and Plan: 1. Right hemisensory deficits/cognitive deficitssecondary to left thalamic infarct -continue CIR therapies  -Small chronic L cerebellar infarct noted by radiologist but I didn't see it on the images, not seen in May 2017, prior MRI, reviewed neuro notes from May 2017 which makes mention of prior Left thalamic lacunar infarct  - cmet,cbc,ua/ucx are all normal  -will work on re-establishing normal sleep cycle--slept ok last  noc , a little restless 2. DVT Prophylaxis/Anticoagulation: Eliquis. 3. Pain Management: Tylenol as needed 4. Mood:  Hold off on meds for now -await neuropsychology consult for cognitive/behavior evaulation/ family education 5. Neuropsych: This patient isnot capable of making decisions on hisown behalf. 6. Skin/Wound Care: Routine skin checks 7. Fluids/Electrolytes/Nutrition: Routine I&O with follow-up chemistries during admit   8.CAD status post CABG. Continue aspirin,Continue Imdur 15 mg daily 9.PAF. Maintain on Eliquis. Rate controlled 10.Hyperlipidemia. Pravachol 11. Hyperglycemia/DM 2--fair control at present           -pt on glucotrol at home--I have resumed this at 2.5mg  daily           -ssi           -CM diet CBG (last 3)   Recent Labs  11/02/16 1658 11/02/16 2138 11/03/16 0616  GLUCAP 127* 170* 129*    LOS (Days) 4 A FACE TO FACE EVALUATION WAS PERFORMED    Charlett Blake, MD 11/03/2016 7:14 AM

## 2016-11-03 NOTE — Progress Notes (Signed)
Occupational Therapy Session Note  Patient Details  Name: Jonathan [name] MRN: [id number] Date of Birth: Feb 15, 1929  Today's Date: 11/03/2016 OT Individual Time: [id number] OT Individual Time Calculation (min): 47 min    Short Term Goals: Week 1:  OT Short Term Goal 1 (Week 1): Groom standing at sink with setup and standby assist OT Short Term Goal 2 (Week 1): Complete bathing using DME with min assist for dynamic standing balance. OT Short Term Goal 3 (Week 1): Complete shower transfers with min assist for safe techique and hand placement OT Short Term Goal 4 (Week 1): Demo improved memory and sequencing as evidenced by ability to provide setup direction to caregiver for dressing with min questioning cues  Skilled Therapeutic Interventions/Progress Updates:    Upon entering the room, pt seated on EOB with wife and son present in the room. Pt engaged in cognitive and fine motor tasks with use of cards with various shapes and colors. Pt asked to match cards of colors which he was able to do with only one mistake where he matched card with shape instead. Pt having increased difficulty picking up cards from table with R UE and required max verbal cues to not utilize L UE for task. Pt stating, "These cards are faulty". OT grabbing another stack of cards in which pt continues to have difficulty. Pt then having pt perform task with L hand and then R but pt continues to have limited awareness to deficits at this time. OT gave pt 4 pictures to sequence for washing clothes and pt unable to initiate task or explain pictures at all. Pt with nonsensical conversation this session. Caregiver present in room and therapist educated them on purpose of tasks as well. Pt remained in bed with call bell and all needed items within reach upon exiting the room.    Therapy Documentation Precautions:  Precautions Precautions: Fall Restrictions Weight Bearing Restrictions: No General:   Vital Signs: Therapy  Vitals Temp: 98.6 F (37 C) Temp Source: Oral Pulse Rate: 90 Resp: 17 BP: 134/70 Patient Position (if appropriate): Sitting Oxygen Therapy SpO2: 100 % O2 Device: Not Delivered Pain:   ADL: ADL ADL Comments: see Functional Assessment Tool Exercises:   Other Treatments:    See Function Navigator for Current Functional Status.   Therapy/Group: Individual Therapy  Gypsy Decant 11/03/2016, 4:08 PM

## 2016-11-04 ENCOUNTER — Inpatient Hospital Stay (HOSPITAL_COMMUNITY): Payer: Medicare Other | Admitting: Occupational Therapy

## 2016-11-04 ENCOUNTER — Inpatient Hospital Stay (HOSPITAL_COMMUNITY): Payer: Medicare Other | Admitting: Speech Pathology

## 2016-11-04 ENCOUNTER — Inpatient Hospital Stay (HOSPITAL_COMMUNITY): Payer: Medicare Other | Admitting: Physical Therapy

## 2016-11-04 LAB — GLUCOSE, CAPILLARY
Glucose-Capillary: 127 mg/dL — ABNORMAL HIGH (ref 65–99)
Glucose-Capillary: 148 mg/dL — ABNORMAL HIGH (ref 65–99)

## 2016-11-04 MED ORDER — GLIPIZIDE 10 MG PO TABS
5.0000 mg | ORAL_TABLET | Freq: Every day | ORAL | Status: DC
Start: 2016-11-05 — End: 2016-11-10
  Administered 2016-11-05 – 2016-11-10 (×6): 5 mg via ORAL
  Filled 2016-11-04 (×6): qty 1

## 2016-11-04 NOTE — Patient Care Conference (Signed)
Inpatient RehabilitationTeam Conference and Plan of Care Update Date: 11/04/2016   Time: 11:25 AM    Patient Name: Jonathan [name]      Medical Record Number: [id number]  Date of Birth: 1929-02-15 Sex: Male         Room/Bed: 4M07C/4M07C-01 Payor Info: Payor: [place] / Plan: BCBS MEDICARE / Product Type: *No Product type* /    Admitting Diagnosis: CVA  Admit Date/Time:  10/30/2016  8:48 PM Admission Comments: No comment available   Primary Diagnosis:  <principal problem not specified> Principal Problem: <principal problem not specified>  Patient Active Problem List   Diagnosis Date Noted  . Expressive aphasia 10/30/2016  . Thalamic infarct, acute ([facility]) 10/30/2016  . Cerebral thrombosis with cerebral infarction 10/29/2016  . Acute CVA (cerebrovascular accident) ([facility]) 10/29/2016  . Syncope 10/28/2016  . Long-term (current) use of anticoagulants 10/17/2015  . Typical atrial flutter ([facility])   . Diabetes mellitus ([place]) 09/28/2015  . Paroxysmal atrial fibrillation ([facility])   . Coronary atherosclerosis of native coronary artery 07/14/2013  . Mixed hyperlipidemia 07/14/2013  . Hypertensive heart disease without CHF     Expected Discharge Date: Expected Discharge Date: 11/10/16  Team Members Present: Physician leading conference: Dr. Alysia Penna Social Worker Present: Ovidio Kin, LCSW Nurse Present: Heather Roberts, RN PT Present: Dwyane Dee, PT;Rodney Lajuana Matte, PT OT Present: Simonne Come, Artemio Aly, OT SLP Present: Weston Anna, SLP PPS Coordinator present : Daiva Nakayama, RN, CRRN     Current Status/Progress Goal Weekly Team Focus  Medical   aphasia , poor awareness of deficits, CBGs elevated  Maintain blood pressure. Blood sugars in desired range  Oral hypoglycemic dose adjustment   Bowel/Bladder   Pt continent of B/B; LBM 11/02/16  Pt to maintai current level of continence  Monitor for changes in level of continence   Swallow/Nutrition/  Hydration             ADL's   min guard ambulation/transfers, Rt lower quadrant visual impairments, min assist bathing and dressing  Supervision bathing and shower transfers, Mod I dressing and toileting  awareness, vision, ADL retraining, dynamic standing balance   Mobility   min guard overall  supervision overall  balance, problem solving, attention, ambulation without AD, stair negotiation, strengthening   Communication   Mod-Max A  Supervision for comprehension and Min A for verbal expression   functional communication, expression of wants/needs    Safety/Cognition/ Behavioral Observations  Mod-Max A  Supervision-Min A   awareness, attention, memory   Pain   Pt does not c/o pain  <3  Assess for nonverbal indicators of pain   Skin   No issues with skin integrity/breakdown  pt will be free of skin breakdown  monitor for changes in skin integrity      *See Care Plan and progress notes for long and short-term goals.  Barriers to Discharge: Safety awareness issues, poor awareness of deficits    Possible Resolutions to Barriers:  Continue rehabilitation, family education    Discharge Planning/Teaching Needs:  Home with wife who only can provide supervision level-son has been here attending therapies with him.      Team Discussion:  Goals supervision level. Physically doing well more cognitive issues with where his stroke was. Language impaired working with SP on this. Impulsive at times this was issue prior to admission. Question depression component will have neuro-psych see while here. Son here daily will have wife come in due to she will be the one with him  at discharge, see if she can provide the care pt needs, due to has her own health issues.  Revisions to Treatment Plan:  DC 1/30   Continued Need for Acute Rehabilitation Level of Care: The patient requires daily medical management by a physician with specialized training in physical medicine and rehabilitation for the  following conditions: Daily direction of a multidisciplinary physical rehabilitation program to ensure safe treatment while eliciting the highest outcome that is of practical value to the patient.: Yes Daily medical management of patient stability for increased activity during participation in an intensive rehabilitation regime.: Yes Daily analysis of laboratory values and/or radiology reports with any subsequent need for medication adjustment of medical intervention for : Neurological problems;Mood/behavior problems;Diabetes problems  Doyne Ellinger, Gardiner Rhyme 11/04/2016, 1:16 PM

## 2016-11-04 NOTE — Progress Notes (Signed)
Social Work Patient ID: Jonathan [name], male   DOB: Feb 18, 1929, 83 y.o.   MRN: [id number]  Met with pt and son to discuss team conference goals supervision level and target discharge date 1/30.  He reports he is unsure is Mom can provide the care he will need, he has been here with Dad and can provide some but has to return to work eventually. Discussed education with pt's wife to see if She is able to provide the care he will require at discharge. She is having surgery tomorrow and may be able to come in Monday. Son feels pt may be depressed and would benefit from seeing neuro-psych While here. Agreeable to home health follow up would like a list of agencies. Will provide this to him. Pt is having difficulty believing he is making progress here.

## 2016-11-04 NOTE — Progress Notes (Signed)
Physical Therapy Session Note  Patient Details  Name: Jonathan [name] MRN: [id number] Date of Birth: 08/25/1929  Today's Date: 11/04/2016 PT Individual Time: 1300-1400 PT Individual Time Calculation (min): 60 min   Short Term Goals: Week 1:  PT Short Term Goal 1 (Week 1): STG=LTG due to ELOS  Skilled Therapeutic Interventions/Progress Updates:    no c/o pain, sleeping on arrival but awakes easily.  Session focus on dynamic sitting balance, sequencing for removing non-skid socks and donning socks/shoes with shoe horn, standing balance, and high level gait training.    Pt comes to EOB mod I and manages socks and shoes with supervision and min verbal cues for efficiency and safety.  Gait to and from therapy gym with steady assist for balance, noted improvement in coordination with reciprocal stepping pattern and ongoing R trunk collapse with R weight bearing.  PT administered Berg and the patient demonstrates significant fall risk as noted by score of 37/56 on Berg Balance Scale.  Interpreted results for pt and family, both verbalizing understanding though unsure how much pt able to comprehend due to cognitive deficits.   Recommending continued balance activities in therapy, and 24/7 supervision at d/c for safety.  Pt negotiated obstacle course x6 trials with steady assist fade to supervision with practice, focus on stepping over and around obstacles, and turning.  Pt completed kinetron x1 minute in sitting, and x2 minutes in standing with verbal cues for equal weight bearing during sit<>stand and upright posture during stepping.  Pt returned to room at end of session and positioned EOB with family present, call bell in reach and needs met.    Therapy Documentation Precautions:  Precautions Precautions: Fall Restrictions Weight Bearing Restrictions: No   See Function Navigator for Current Functional Status.   Therapy/Group: Individual Therapy  Earnest Conroy Penven-Crew 11/04/2016, 5:35 PM

## 2016-11-04 NOTE — Progress Notes (Signed)
[facility] PHYSICAL MEDICINE & REHABILITATION     PROGRESS NOTE    Subjective/Complaints: [name] cleared to do toilet transfers, at [facility] level Good appetite CBGs reviewed and are elevated Pt can correctly pick hospital out of 3 choices for orientation today  ROS limited by cognitive/behavior but denies pain or breathing issues  Objective: Vital Signs: Blood pressure 134/70, pulse 90, temperature 98.6 F (37 C), temperature source Oral, resp. rate 17, weight 77 kg (169 lb 12.8 oz), SpO2 100 %. No results found.  Recent Labs  11/01/16 0851  WBC 8.6  HGB 12.7*  HCT 39.1  PLT 258    Recent Labs  11/01/16 0851  NA 138  K 4.1  CL 103  GLUCOSE 166*  BUN 18  CREATININE 0.70  CALCIUM 9.0   CBG (last 3)   Recent Labs  11/03/16 1126 11/03/16 1636 11/03/16 2042  GLUCAP 187* 177* 142*    Wt Readings from Last 3 Encounters:  10/30/16 77 kg (169 lb 12.8 oz)  10/28/16 74.8 kg (164 lb 14.4 oz)  02/13/16 79.4 kg (175 lb)    Physical Exam:  Constitutional: He appears well-developedand well-nourished.  HENT:  Head: Normocephalicand atraumatic.  Eyes: EOMare normal. Left eye exhibits no discharge.  Neck: Normal range of motion. Neck supple. No JVDpresent. No tracheal deviationpresent. Thyromegalypresent.  Cardiovascular: Exam reveals no gallopand no friction rub.  No murmurheard. Cardiac rate control Respiratory: Effort normaland breath sounds normal. No respiratory distress. He has no wheezes. He has no rales.  GI: Soft. Bowel sounds are normal. He exhibits no distension. There is no tenderness. There is no rebound.  Musculoskeletal: He exhibits no edema.  Skin warm and dry Neurological: He is alert. Oriented to person , no date . Limited insight and awareness. Delays in processing. Follows simple commands inconsistently.   Motor 4+/5 RUE and 4 to 4+/5 RLE. LUE and LLE 4+to 5/5. Sensation diminished trace amount RUE, RLE. +pronator drift persists on Right   Psych: flat, disengaged   Assessment/Plan: 1. Right hemisensory and cognitive deficits secondary to left thalamic infarct which require 3+ hours per day of interdisciplinary therapy in a comprehensive inpatient rehab setting. Physiatrist is providing close team supervision and 24 hour management of active medical problems listed below. Physiatrist and rehab team continue to assess barriers to discharge/monitor patient progress toward functional and medical goals.  Function:  Bathing Bathing position   Position: Shower  Bathing parts Body parts bathed by patient: Right arm, Left arm, Chest, Abdomen, Front perineal area, Right upper leg, Left upper leg, Right lower leg, Left lower leg, Buttocks Body parts bathed by helper: Back  Bathing assist Assist Level: Touching or steadying assistance(Pt > 75%)      Upper Body Dressing/Undressing Upper body dressing   What is the patient wearing?: Pull over shirt/dress     Pull over shirt/dress - Perfomed by patient: Thread/unthread right sleeve, Thread/unthread left sleeve, Put head through opening, Pull shirt over trunk          Upper body assist Assist Level: Set up, Supervision or verbal cues      Lower Body Dressing/Undressing Lower body dressing   What is the patient wearing?: Underwear, Pants, Non-skid slipper socks, Ted Hose Underwear - Performed by patient: Thread/unthread right underwear leg, Thread/unthread left underwear leg, Pull underwear up/down   Pants- Performed by patient: Thread/unthread right pants leg, Thread/unthread left pants leg, Pull pants up/down, Fasten/unfasten pants   Non-skid slipper socks- Performed by patient: Don/doff right sock, Don/doff  left sock   Socks - Performed by patient: Don/doff right sock, Don/doff left sock   Shoes - Performed by patient: Don/doff right shoe, Don/doff left shoe       TED Hose - Performed by patient: Don/doff left TED hose TED Hose - Performed by helper: Don/doff right  TED hose  Lower body assist Assist for lower body dressing: Touching or steadying assistance (Pt > 75%)      Toileting Toileting   Toileting steps completed by patient: Adjust clothing prior to toileting, Performs perineal hygiene, Adjust clothing after toileting Toileting steps completed by helper: Adjust clothing prior to toileting, Performs perineal hygiene, Adjust clothing after toileting Toileting Assistive Devices: Grab bar or rail  Toileting assist Assist level: Touching or steadying assistance (Pt.75%)   Transfers Chair/bed transfer   Chair/bed transfer method: Ambulatory Chair/bed transfer assist level: Touching or steadying assistance (Pt > 75%) Chair/bed transfer assistive device: Armrests     Locomotion Ambulation     Max distance: 156 Assist level: Touching or steadying assistance (Pt > 75%)   Wheelchair   Type: Manual Max wheelchair distance: 135f  Assist Level: Supervision or verbal cues  Cognition Comprehension Comprehension assist level: Understands basic 90% of the time/cues < 10% of the time  Expression Expression assist level: Expresses basic 75 - 89% of the time/requires cueing 10 - 24% of the time. Needs helper to occlude trach/needs to repeat words.  Social Interaction Social Interaction assist level: Interacts appropriately 75 - 89% of the time - Needs redirection for appropriate language or to initiate interaction.  Problem Solving Problem solving assist level: Solves basic 50 - 74% of the time/requires cueing 25 - 49% of the time  Memory Memory assist level: Recognizes or recalls 50 - 74% of the time/requires cueing 25 - 49% of the time   Medical Problem List and Plan: 1. Right hemisensory deficits/cognitive deficitssecondary to left thalamic infarct -Team conference today please see physician documentation under team conference tab, met with team face-to-face to discuss problems,progress, and goals. Formulized individual treatment plan  based on medical history, underlying problem and comorbidities.  - cmet,cbc,ua/ucx are all normal  -will work on re-establishing normal sleep cycle--slept ok last noc , a little restless 2. DVT Prophylaxis/Anticoagulation: Eliquis. 3. Pain Management: Tylenol as needed 4. Mood:  Hold off on meds for now -await neuropsychology consult for cognitive/behavior evaulation/ family education 5. Neuropsych: This patient isnot capable of making decisions on hisown behalf. 6. Skin/Wound Care: Routine skin checks 7. Fluids/Electrolytes/Nutrition: Routine I&O , intake 75-100% 8.CAD status post CABG. Continue aspirin,Continue Imdur 15 mg daily 9.PAF. Maintain on Eliquis. Rate controlled 10.Hyperlipidemia. Pravachol 11. Hyperglycemia/DM 2--fair control at present           -pt on glucotrol at home--increase to 53m          -ssi           -CM diet CBG (last 3)   Recent Labs  11/03/16 1126 11/03/16 1636 11/03/16 2042  GLUCAP 187* 177* 142*  12.  Bowel and bladder without incont  LOS (Days) 5 A FACE TO FACE EVALUATION WAS PERFORMED    KICharlett BlakeMD 11/04/2016 7:14 AM

## 2016-11-04 NOTE — Progress Notes (Signed)
Occupational Therapy Session Note  Patient Details  Name: Jonathan [name] MRN: [id number] Date of Birth: 1929/03/02  Today's Date: 11/04/2016 OT Individual Time: 0905-1003 and 1132-1202 OT Individual Time Calculation (min): 58 min and 30 min   Short Term Goals: Week 1:  OT Short Term Goal 1 (Week 1): Groom standing at sink with setup and standby assist OT Short Term Goal 2 (Week 1): Complete bathing using DME with min assist for dynamic standing balance. OT Short Term Goal 3 (Week 1): Complete shower transfers with min assist for safe techique and hand placement OT Short Term Goal 4 (Week 1): Demo improved memory and sequencing as evidenced by ability to provide setup direction to caregiver for dressing with min questioning cues  Skilled Therapeutic Interventions/Progress Updates:    1) Treatment session with focus on functional mobility, sequencing, and dynamic standing balance during self-care tasks.  Upon arrival pt's son present, reporting continued reports of double vision and even dark spots in vision with Rt eye only.  Engaged in further assessment and discussion, with inconsistent results secondary to pt decreased cognition and communication.  Engaged in bathing at sit > stand level in room shower with min cues for sequencing throughout: for use of soap, thoroughness, rinsing.  Pt attempted to doff pants in standing initially, then sitting on shower seat to fully doff pants and underwear.  Completed dressing at sit > stand level with close supervision.  Grooming completed in standing with min cues to locate toothpaste, as pt initially attempting to use no-rinse soap.  Educated pt and pt's son on use of familiar items and maintaining setup to increase success/familiarity.  Engaged in card activity as pt reports enjoying playing solitaire.  Pt setup solitaire with cards, requiring initial row set up with pt able to complete - however unable to initiate.  Then unable to recall sequencing or  rules of solitaire with inability to play game despite cues and demonstration cues.  Left supine in bed with son present.  2) Treatment session with focus on dynamic standing balance and vision.  Pt asleep in bed upon arrival, easily aroused to name.  Ambulated to therapy gym with increased Rt lean and instability of gait, pt's son reports this is consistent when waking up at night for toileting.  Engaged in dynamic balance activity incorporating visual scanning and bending to retrieve items from floor to locate numbered discs in order.  Pt required increased cues to scan to Rt and for sequencing as well as min assist for balance due to increasing Rt lean and instability.  Pt reports not feeling his Rt leg, returned to sitting and vitals assessed (see below).  After 1-2 min rest break, pt willing to attempt ambulation back to room with improved trunk control and stability.  Pt reporting leg sensation returned.  Notified RN.  Therapy Documentation Precautions:  Precautions Precautions: Fall Restrictions Weight Bearing Restrictions: No General:   Vital Signs: Therapy Vitals Pulse Rate: 87 BP: (!) 142/73 Patient Position (if appropriate): Sitting Pain: Pain Assessment Pain Assessment: No/denies pain Faces Pain Scale: No hurt  See Function Navigator for Current Functional Status.   Therapy/Group: Individual Therapy  Jonathan [name] 11/04/2016, 12:33 PM

## 2016-11-04 NOTE — Progress Notes (Signed)
Speech Language Pathology Daily Session Note  Patient Details  Name: Jonathan [name] MRN: [id number] Date of Birth: September 06, 1929  Today's Date: 11/04/2016 SLP Individual Time: 0815-0900 SLP Individual Time Calculation (min): 45 min  Short Term Goals: Week 1: SLP Short Term Goal 1 (Week 1): Pt will recognize and correct verbal errors during functional tasks with mod assist multimodal cues.   SLP Short Term Goal 2 (Week 1): Pt will utilize compensatory word finding strategies during structured and unstructured language tasks with min assist verbal cues.   SLP Short Term Goal 3 (Week 1): Pt will follow multi-unit commands during functional tasks with min assist multimodal cues.   SLP Short Term Goal 4 (Week 1): Pt will sustain his attention to tasks for 3-5 minutes with min verbal cues for redirection.   SLP Short Term Goal 5 (Week 1): Pt will utilize memory compensatory strategies to facilitate recall of daily information with mod assist multimodal cues.    Skilled Therapeutic Interventions: Skilled treatment session focused on cognitive-linguisitc goals. SLP facilitated session by providing extra time and Min A question cues for problem solving and emergent awareness of errors during a basic money management task. Patient also performed reading comprehension tasks at the word and phrase level with Mod I but required Max A verbal and visual cues at the paragraph level. Patient demonstrated sustained attention to tasks for ~20 minutes with supervision verbal cues for redirection. Patient left sitting EOB with family present. Continue with current plan of care.      Function:    Cognition Comprehension Comprehension assist level: Understands basic 90% of the time/cues < 10% of the time  Expression   Expression assist level: Expresses basic 75 - 89% of the time/requires cueing 10 - 24% of the time. Needs helper to occlude trach/needs to repeat words.  Social Interaction Social Interaction assist  level: Interacts appropriately 75 - 89% of the time - Needs redirection for appropriate language or to initiate interaction.  Problem Solving Problem solving assist level: Solves basic 50 - 74% of the time/requires cueing 25 - 49% of the time  Memory Memory assist level: Recognizes or recalls 50 - 74% of the time/requires cueing 25 - 49% of the time    Pain No/Denies Pain   Therapy/Group: Individual Therapy  Ladashia Demarinis, [place] 11/04/2016, 4:05 PM

## 2016-11-04 NOTE — Progress Notes (Signed)
HS CBG 128.   Jonathan [name] M

## 2016-11-05 ENCOUNTER — Inpatient Hospital Stay (HOSPITAL_COMMUNITY): Payer: Medicare Other | Admitting: Physical Therapy

## 2016-11-05 ENCOUNTER — Inpatient Hospital Stay (HOSPITAL_COMMUNITY): Payer: Medicare Other | Admitting: Occupational Therapy

## 2016-11-05 ENCOUNTER — Inpatient Hospital Stay (HOSPITAL_COMMUNITY): Payer: Medicare Other | Admitting: Speech Pathology

## 2016-11-05 DIAGNOSIS — I25810 Atherosclerosis of coronary artery bypass graft(s) without angina pectoris: Secondary | ICD-10-CM

## 2016-11-05 DIAGNOSIS — F09 Unspecified mental disorder due to known physiological condition: Secondary | ICD-10-CM

## 2016-11-05 DIAGNOSIS — E1159 Type 2 diabetes mellitus with other circulatory complications: Secondary | ICD-10-CM

## 2016-11-05 DIAGNOSIS — G8191 Hemiplegia, unspecified affecting right dominant side: Secondary | ICD-10-CM

## 2016-11-05 LAB — GLUCOSE, CAPILLARY
GLUCOSE-CAPILLARY: 128 mg/dL — AB (ref 65–99)
GLUCOSE-CAPILLARY: 113 mg/dL — AB (ref 65–99)
GLUCOSE-CAPILLARY: 99 mg/dL (ref 65–99)
GLUCOSE-CAPILLARY: 170 mg/dL — AB (ref 65–99)
Glucose-Capillary: 101 mg/dL — ABNORMAL HIGH (ref 65–99)

## 2016-11-05 NOTE — Progress Notes (Signed)
Speech Language Pathology Daily Session Note  Patient Details  Name: Jonathan [name] MRN: [id number] Date of Birth: 1929/03/02  Today's Date: 11/05/2016 SLP Individual Time: [id number] SLP Individual Time Calculation (min): 34 min  Short Term Goals: Week 1: SLP Short Term Goal 1 (Week 1): Pt will recognize and correct verbal errors during functional tasks with mod assist multimodal cues.   SLP Short Term Goal 2 (Week 1): Pt will utilize compensatory word finding strategies during structured and unstructured language tasks with min assist verbal cues.   SLP Short Term Goal 3 (Week 1): Pt will follow multi-unit commands during functional tasks with min assist multimodal cues.   SLP Short Term Goal 4 (Week 1): Pt will sustain his attention to tasks for 3-5 minutes with min verbal cues for redirection.   SLP Short Term Goal 5 (Week 1): Pt will utilize memory compensatory strategies to facilitate recall of daily information with mod assist multimodal cues.    Skilled Therapeutic Interventions: Pt was seen for skilled ST targeting cognitive-linguistic goals.  Pt was able to identify errors of written expression during familiar tasks such as writing a check or addressing an envelope with supervision and then correct errors with min assist verbal cues and more than a reasonable amount of time; however when recording more complex, verbally presented information, pt needed max assist to recognize and correct written errors.  Pt was left sitting at edge of bed with call bell within reach and daughter at bedside.  Continue per current plan of care.       Function:  Eating Eating   Modified Consistency Diet: No Eating Assist Level: More than reasonable amount of time;Set up assist for   Eating Set Up Assist For: Opening containers;Cutting food       Cognition Comprehension Comprehension assist level: Understands basic 75 - 89% of the time/ requires cueing 10 - 24% of the time  Expression    Expression assist level: Expresses basic 75 - 89% of the time/requires cueing 10 - 24% of the time. Needs helper to occlude trach/needs to repeat words.  Social Interaction Social Interaction assist level: Interacts appropriately 75 - 89% of the time - Needs redirection for appropriate language or to initiate interaction.  Problem Solving Problem solving assist level: Solves basic 75 - 89% of the time/requires cueing 10 - 24% of the time  Memory Memory assist level: Recognizes or recalls 50 - 74% of the time/requires cueing 25 - 49% of the time    Pain Pain Assessment Pain Assessment: No/denies pain  Therapy/Group: Individual Therapy  Carmen Vallecillo, Selinda [place] 11/05/2016, 4:21 PM

## 2016-11-05 NOTE — Progress Notes (Signed)
Occupational Therapy Session Note  Patient Details  Name: JEANNETTE PALLADINO MRN: [id number] Date of Birth: [birth year]  Today's Date: 11/05/2016 OT Individual Time: [id number] OT Individual Time Calculation (min): 40 min    Short Term Goals: Week 1:  OT Short Term Goal 1 (Week 1): Groom standing at sink with setup and standby assist OT Short Term Goal 2 (Week 1): Complete bathing using DME with min assist for dynamic standing balance. OT Short Term Goal 3 (Week 1): Complete shower transfers with min assist for safe techique and hand placement OT Short Term Goal 4 (Week 1): Demo improved memory and sequencing as evidenced by ability to provide setup direction to caregiver for dressing with min questioning cues  Skilled Therapeutic Interventions/Progress Updates:    Treatment session with focus on d/c planning and home modifications.  Pt's daughter present for session.  Engaged in discussion with pt and pt's daughter regarding home setup with use of pictures provided by pt's son.  Discussed removal of throw rugs in bathroom, applying grab bars next to toilets and in shower, as well as obstacle negotiation that will need to be practiced prior to d/c home.  Will need to further assess safety with sit <> stand from rocking recliner and office chair as well as stepping in/out of walk-in shower threshold.  Pt attempted to engage in conversation, however demonstrating circumlocution and difficulty with pictures due to not being actual size.  Therapy Documentation Precautions:  Precautions Precautions: Fall Restrictions Weight Bearing Restrictions: No Pain:  Pt with no c/o pain  See Function Navigator for Current Functional Status.   Therapy/Group: Individual Therapy  Simonne Come 11/05/2016, 3:37 PM

## 2016-11-05 NOTE — Progress Notes (Signed)
[facility]     PROGRESS NOTE    Subjective/Complaints: Pt seen laying in bed this AM.  Daughter at bedside, who provides much of history. Pt slept well overnight.   ROS: limited by cognition, but appears to deny CP, SOB, N/V/D.  Objective: Vital Signs: Blood pressure 136/65, pulse 73, temperature 97.7 F (36.5 C), temperature source Oral, resp. rate 18, weight 77.4 kg (170 lb 9.6 oz), SpO2 97 %. No results found. No results for input(s): WBC, HGB, HCT, PLT in the last 72 hours. No results for input(s): NA, K, CL, GLUCOSE, BUN, CREATININE, CALCIUM in the last 72 hours.  Invalid input(s): CO CBG (last 3)   Recent Labs  11/04/16 0628 11/04/16 1201 11/05/16 0633  GLUCAP 127* 148* 113*    Wt Readings from Last 3 Encounters:  11/04/16 77.4 kg (170 lb 9.6 oz)  10/28/16 74.8 kg (164 lb 14.4 oz)  02/13/16 79.4 kg (175 lb)    Physical Exam:  Constitutional: He appears well-developedand well-nourished. NAD.  HENT: Normocephalicand atraumatic.  Eyes: EOMare normal. No discharge.  Cardiovascular: RRR. No JVDpresent. Respiratory: Effort normaland breath sounds normal. GI: Soft. Bowel sounds are normal.  Musculoskeletal: He exhibits no edema. Dupuytren's contracture RUE.  Skin warm and dry Neurological: He is alert and oriented x1.  Limited insight and awareness.  Delays in processing.  Follows simple commands inconsistently.    Motor 4+/5 RUE and 4/5 RLE with apraxia LUE and LLE 4+-5/5.  Psych: flat, disengaged   Assessment/Plan: 1. Right hemisensory and cognitive deficits secondary to left thalamic infarct which require 3+ hours per day of interdisciplinary therapy in a comprehensive inpatient rehab setting. Physiatrist is providing close team supervision and 24 hour management of active medical problems listed below. Physiatrist and rehab team continue to assess barriers to discharge/monitor patient progress toward functional and  medical goals.  Function:  Bathing Bathing position   Position: Shower  Bathing parts Body parts bathed by patient: Right arm, Left arm, Chest, Abdomen, Front perineal area, Right upper leg, Left upper leg, Right lower leg, Left lower leg, Buttocks Body parts bathed by helper: Back  Bathing assist Assist Level: Touching or steadying assistance(Pt > 75%)      Upper Body Dressing/Undressing Upper body dressing   What is the patient wearing?: Pull over shirt/dress     Pull over shirt/dress - Perfomed by patient: Thread/unthread right sleeve, Thread/unthread left sleeve, Put head through opening, Pull shirt over trunk          Upper body assist Assist Level: Set up   Set up : To obtain clothing/put away  Lower Body Dressing/Undressing Lower body dressing   What is the patient wearing?: Underwear, Pants, Non-skid slipper socks Underwear - Performed by patient: Thread/unthread right underwear leg, Thread/unthread left underwear leg, Pull underwear up/down   Pants- Performed by patient: Thread/unthread right pants leg, Thread/unthread left pants leg, Pull pants up/down, Fasten/unfasten pants   Non-skid slipper socks- Performed by patient: Don/doff right sock, Don/doff left sock   Socks - Performed by patient: Don/doff right sock, Don/doff left sock   Shoes - Performed by patient: Don/doff right shoe, Don/doff left shoe       TED Hose - Performed by patient: Don/doff left TED hose TED Hose - Performed by helper: Don/doff right TED hose  Lower body assist Assist for lower body dressing: Touching or steadying assistance (Pt > 75%)      Toileting Toileting   Toileting steps completed by patient: Adjust  clothing prior to toileting, Performs perineal hygiene, Adjust clothing after toileting Toileting steps completed by helper: Adjust clothing prior to toileting, Performs perineal hygiene, Adjust clothing after toileting Toileting Assistive Devices: Grab bar or rail  Toileting  assist Assist level: Touching or steadying assistance (Pt.75%)   Transfers Chair/bed transfer   Chair/bed transfer method: Stand pivot Chair/bed transfer assist level: Supervision or verbal cues Chair/bed transfer assistive device: Armrests     Locomotion Ambulation     Max distance: 156 Assist level: Touching or steadying assistance (Pt > 75%)   Wheelchair   Type: Manual Max wheelchair distance: 137ft  Assist Level: Supervision or verbal cues  Cognition Comprehension Comprehension assist level: Understands basic 90% of the time/cues < 10% of the time  Expression Expression assist level: Expresses basic 90% of the time/requires cueing < 10% of the time.  Social Interaction Social Interaction assist level: Interacts appropriately 90% of the time - Needs monitoring or encouragement for participation or interaction.  Problem Solving Problem solving assist level: Solves basic 90% of the time/requires cueing < 10% of the time  Memory Memory assist level: Recognizes or recalls 90% of the time/requires cueing < 10% of the time   Medical Problem List and Plan: 1. Right hemisensory deficits/cognitive deficitssecondary to left thalamic infarct  CT/MRI reviewed, CT unremarkable, MRI showing thalamic lesion  Cont CIR  Appears to have good family support 2. DVT Prophylaxis/Anticoagulation: Eliquis. 3. Pain Management: Tylenol as needed 4. Mood:  Hold off on meds for now -awaiting neuropsychology consult for cognitive/behavior evaulation/ family education 5. Neuropsych: This patient isnot capable of making decisions on hisown behalf. 6. Skin/Wound Care: Routine skin checks 7. Fluids/Electrolytes/Nutrition: Routine I&Os  Recent labs relatively stable 8.CAD status post CABG. Continue aspirin,Continue Imdur 15 mg daily 9.PAF. Maintain on Eliquis. Rate controlled 10.Hyperlipidemia. Pravachol 11. DM 2--fair control at present           -pt on glucotrol at home--increased to 5mg             -ssi           -CM diet  Appears to be improving CBG (last 3)   Recent Labs  11/04/16 0628 11/04/16 1201 11/05/16 0633  GLUCAP 127* 148* 113*  12.  Bowel and bladder without incont  LOS (Days) 6 A FACE TO FACE EVALUATION WAS PERFORMED    Ankit Lorie Phenix, MD 11/05/2016 9:52 AM

## 2016-11-05 NOTE — Progress Notes (Signed)
Physical Therapy Session Note  Patient Details  Name: SENDER RUEB MRN: [id number] Date of Birth: [birth year]  Today's Date: 11/05/2016 PT Individual Time: 1100-1200 PT Individual Time Calculation (min): 60 min   Short Term Goals: Week 1:  PT Short Term Goal 1 (Week 1): STG=LTG due to ELOS  Skilled Therapeutic Interventions/Progress Updates:  Handoff from previous PT in therapy gym.  Pt c/o pain in L medial knee immediately superior to joint line and immediately medial to patellar tendon.  No swelling noted, no pain with AROM/PROM, but tender to touch.  PT applied kinesiotape for stabilization and decompression with pt noted improvement in pain to 0/10 in L knee.  Remainder of session focus on NMR for core strengthening, posture, and balance.  Pt performed 2x10 reps reaching with LUE while in R side lying to target L obliques for carryover into gait.  NMR for shoulder positioning x8 reps shoulder retraction and depression with verbal cues for technique and posture.  Alternating toe taps on 4" step with initial steady assist fade to supervision with practice, pt performs 2 trials to fatigue.  Gait back to room with supervision, noting improvement in equal step length, and R trunk collapse improved compared to previous session.  Pt positioned in bed with ice to L knee. Call bell in reach and needs met.      Therapy Documentation Precautions:  Precautions Precautions: Fall Restrictions Weight Bearing Restrictions: No   See Function Navigator for Current Functional Status.   Therapy/Group: Individual Therapy  Earnest Conroy Penven[place]Crew 11/05/2016, 12:03 PM

## 2016-11-05 NOTE — Progress Notes (Signed)
Physical Therapy Session Note  Patient Details  Name: Jonathan [name] MRN: [id number] Date of Birth: 1928-11-13  Today's Date: 11/05/2016 PT Individual Time: 1030-1100 PT Individual Time Calculation (min): 30 min   Short Term Goals: Week 1:  PT Short Term Goal 1 (Week 1): STG=LTG due to ELOS  Skilled Therapeutic Interventions/Progress Updates:    Session focused on gait training without AD and neuro re-ed for higher level balance activities including sidestepping, tandem gait, and retro-gait. Pt required overall min assist with intermittent use of 1 UE support on rail for balance. Pt with new L knee pain today- mild inflammation noted at anterior joint line. Pt reports it started this morning and has been using ice and tylenol. Pt requires verbal and tactile cues for upright posture during all mobility. Decreased weigthbearing noted on LLE during gait due to pain.   Therapy Documentation Precautions:  Precautions Precautions: Fall Restrictions Weight Bearing Restrictions: No   Pain: Pain Assessment Pain Assessment: 0-10 Pain Score: 3  Faces Pain Scale: No hurt Pain Type: Acute pain Pain Location: Knee Pain Orientation: Left Pain Descriptors / Indicators: Aching Pain Frequency: Intermittent Pain Onset: With Activity Patients Stated Pain Goal: 3 Pain Intervention(s): Medication (See eMAR)   See Function Navigator for Current Functional Status.   Therapy/Group: Individual Therapy   Jonathan [name], PT, DPT  11/05/2016, 11:44 AM

## 2016-11-05 NOTE — Progress Notes (Signed)
Occupational Therapy Session Note  Patient Details  Name: Jonathan [name] MRN: [id number] Date of Birth: Oct 05, 1929  Today's Date: 11/05/2016 OT Individual Time: [id number] OT Individual Time Calculation (min): 57 min    Short Term Goals: Week 1:  OT Short Term Goal 1 (Week 1): Groom standing at sink with setup and standby assist OT Short Term Goal 2 (Week 1): Complete bathing using DME with min assist for dynamic standing balance. OT Short Term Goal 3 (Week 1): Complete shower transfers with min assist for safe techique and hand placement OT Short Term Goal 4 (Week 1): Demo improved memory and sequencing as evidenced by ability to provide setup direction to caregiver for dressing with min questioning cues      Skilled Therapeutic Interventions/Progress Updates:    Pt seen for family education with pt's daughter and ADL training with pt with a focus on balance and cognition along with specific balance training. Pt received in bed and agreeable to shower. Reviewed with pt goals of session.  Pt ambulated in room with steadying A as he had a LOB to the R 3x in the session with min A to recover.  Once in the shower, he only required close S with cues for turning around safely. Cues to sit down to doff/ don pants over feet.  Discussed with daughter safe shower set up with mat outside shower/ safety concerns.   Education on cognitive facilitation with reading newspaper article. Pt needed max cues to comprehend main point of article.  Daughter said he doesn't care for the news and likes reading about cars/planes. Encouraged them to use those magazines to facilitate discussions, comprehension, problem solving.  Daughter requested to be cleared to ambulate dad to bathroom. She provided the contact guard needed to ensure his safety if he should lose his balance. Cued her to always stay in arms length.   Pt ambulated to gym and worked on dynamic lunging forward, to the side, and stepping across midline with  RLE.  Heel raises. Pt ambulated back to room with min cues on which way to turn to find room. Pt sitting on bed with daughter in the room with him.  Therapy Documentation Precautions:  Precautions Precautions: Fall Restrictions Weight Bearing Restrictions: No   Pain: Pain Assessment Pain Assessment: 0-10 Pain Score: 3  Faces Pain Scale: No hurt Pain Type: Acute pain Pain Location: Knee Pain Orientation: Left Pain Descriptors / Indicators: Aching Pain Frequency: Intermittent Pain Onset: With Activity Patients Stated Pain Goal: 3 Pain Intervention(s): Medication (See eMAR) ADL: ADL ADL Comments: see Functional Assessment Tool  See Function Navigator for Current Functional Status.   Therapy/Group: Individual Therapy  [place] 11/05/2016, 12:16 PM

## 2016-11-06 ENCOUNTER — Inpatient Hospital Stay (HOSPITAL_COMMUNITY): Payer: Medicare Other | Admitting: Physical Therapy

## 2016-11-06 ENCOUNTER — Inpatient Hospital Stay (HOSPITAL_COMMUNITY): Payer: Medicare Other | Admitting: Occupational Therapy

## 2016-11-06 ENCOUNTER — Inpatient Hospital Stay (HOSPITAL_COMMUNITY): Payer: Medicare Other

## 2016-11-06 LAB — GLUCOSE, CAPILLARY
Glucose-Capillary: 135 mg/dL — ABNORMAL HIGH (ref 65–99)
Glucose-Capillary: 103 mg/dL — ABNORMAL HIGH (ref 65–99)
Glucose-Capillary: 105 mg/dL — ABNORMAL HIGH (ref 65–99)

## 2016-11-06 NOTE — Progress Notes (Signed)
[place] PHYSICAL MEDICINE & REHABILITATION     PROGRESS NOTE    Subjective/Complaints: Spoke with daughter , pt slept well but has increased confusion in the evening Discussed cognitive recovery with daughter   ROS: limited by cognition, but appears to deny CP, SOB, N/V/D.  Objective: Vital Signs: Blood pressure (!) 158/67, pulse 69, temperature 98.6 F (37 C), temperature source Oral, resp. rate 18, weight 77.4 kg (170 lb 9.6 oz), SpO2 98 %. No results found. No results for input(s): WBC, HGB, HCT, PLT in the last 72 hours. No results for input(s): NA, K, CL, GLUCOSE, BUN, CREATININE, CALCIUM in the last 72 hours.  Invalid input(s): CO CBG (last 3)   Recent Labs  11/05/16 1214 11/05/16 2115 11/06/16 0638  GLUCAP 99 170* 103*    Wt Readings from Last 3 Encounters:  11/04/16 77.4 kg (170 lb 9.6 oz)  10/28/16 74.8 kg (164 lb 14.4 oz)  02/13/16 79.4 kg (175 lb)    Physical Exam:  Constitutional: He appears well-developedand well-nourished. NAD.  HENT: Normocephalicand atraumatic.  Eyes: EOMare normal. No discharge.  Cardiovascular: RRR. No JVDpresent. Respiratory: Effort normaland breath sounds normal. GI: Soft. Bowel sounds are normal.  Musculoskeletal: He exhibits no edema. Dupuytren's contracture RUE.  Skin warm and dry Neurological: He is alert and oriented x1.  Limited insight and awareness.  Delays in processing.  Follows simple commands inconsistently.    Motor 4+/5 RUE and 4/5 RLE with apraxia LUE and LLE 4+-5/5.  Psych: flat, disengaged   Assessment/Plan: 1. Right hemisensory and cognitive deficits secondary to left thalamic infarct which require 3+ hours per day of interdisciplinary therapy in a comprehensive inpatient rehab setting. Physiatrist is providing close team supervision and 24 hour management of active medical problems listed below. Physiatrist and rehab team continue to assess barriers to discharge/monitor patient progress toward  functional and medical goals.  Function:  Bathing Bathing position   Position: Shower  Bathing parts Body parts bathed by patient: Right arm, Left arm, Chest, Abdomen, Front perineal area, Right upper leg, Left upper leg, Right lower leg, Left lower leg, Buttocks, Back Body parts bathed by helper: Back  Bathing assist Assist Level: Supervision or verbal cues      Upper Body Dressing/Undressing Upper body dressing   What is the patient wearing?: Pull over shirt/dress     Pull over shirt/dress - Perfomed by patient: Thread/unthread right sleeve, Thread/unthread left sleeve, Put head through opening, Pull shirt over trunk          Upper body assist Assist Level: Set up   Set up : To obtain clothing/put away  Lower Body Dressing/Undressing Lower body dressing   What is the patient wearing?: Underwear, Pants, Socks, Shoes Underwear - Performed by patient: Thread/unthread right underwear leg, Thread/unthread left underwear leg, Pull underwear up/down   Pants- Performed by patient: Thread/unthread right pants leg, Thread/unthread left pants leg, Pull pants up/down, Fasten/unfasten pants   Non-skid slipper socks- Performed by patient: Don/doff right sock, Don/doff left sock   Socks - Performed by patient: Don/doff right sock, Don/doff left sock   Shoes - Performed by patient: Don/doff right shoe, Don/doff left shoe       TED Hose - Performed by patient: Don/doff left TED hose TED Hose - Performed by helper: Don/doff right TED hose  Lower body assist Assist for lower body dressing: Supervision or verbal cues      Toileting Toileting   Toileting steps completed by patient: Adjust clothing prior to toileting, Performs  perineal hygiene, Adjust clothing after toileting Toileting steps completed by helper: Adjust clothing prior to toileting, Performs perineal hygiene, Adjust clothing after toileting Toileting Assistive Devices: Grab bar or rail  Toileting assist Assist level:  Touching or steadying assistance (Pt.75%)   Transfers Chair/bed transfer   Chair/bed transfer method: Ambulatory Chair/bed transfer assist level: Supervision or verbal cues Chair/bed transfer assistive device: Armrests     Locomotion Ambulation     Max distance: 150' Assist level: Supervision or verbal cues   Wheelchair   Type: Manual Max wheelchair distance: 139ft  Assist Level: Supervision or verbal cues  Cognition Comprehension Comprehension assist level: Understands basic 75 - 89% of the time/ requires cueing 10 - 24% of the time  Expression Expression assist level: Expresses basic 75 - 89% of the time/requires cueing 10 - 24% of the time. Needs helper to occlude trach/needs to repeat words.  Social Interaction Social Interaction assist level: Interacts appropriately 75 - 89% of the time - Needs redirection for appropriate language or to initiate interaction.  Problem Solving Problem solving assist level: Solves basic 75 - 89% of the time/requires cueing 10 - 24% of the time  Memory Memory assist level: Recognizes or recalls 50 - 74% of the time/requires cueing 25 - 49% of the time   Medical Problem List and Plan: 1. Right hemisensory deficits/cognitive deficitssecondary to left thalamic infarct  CT/MRI reviewed, CT unremarkable, MRI showing thalamic lesion  CIR PT, OT< SLP tent d/c 1/30  Appears to have good family support 2. DVT Prophylaxis/Anticoagulation: Eliquis. 3. Pain Management: Tylenol as needed 4. Mood:  Hold off on meds for now -awaiting neuropsychology consult, may need to schedule for outpt if not seen by Monday  for cognitive/behavior evaulation/ family education 5. Neuropsych: This patient isnot capable of making decisions on hisown behalf. 6. Skin/Wound Care: Routine skin checks 7. Fluids/Electrolytes/Nutrition: Routine I&Os   BMET    Component Value Date/Time   NA 138 11/01/2016 0851   K 4.1 11/01/2016 0851   CL 103 11/01/2016 0851   CO2 26  11/01/2016 0851   GLUCOSE 166 (H) 11/01/2016 0851   BUN 18 11/01/2016 0851   CREATININE 0.70 11/01/2016 0851   CALCIUM 9.0 11/01/2016 0851   GFRNONAA >60 11/01/2016 0851   GFRAA >60 11/01/2016 0851    8.CAD status post CABG. Continue aspirin,Continue Imdur 15 mg daily 9.PAF. Maintain on Eliquis. Rate controlled 10.Hyperlipidemia. Pravachol 11. DM 2--fair control at present           -pt on glucotrol at home--increased to 5mg            -ssi           -CM diet  Appears to be improving CBG (last 3)   Recent Labs  11/05/16 1214 11/05/16 2115 11/06/16 0638  GLUCAP 99 170* 103*  12.  Bowel and bladder without incont  LOS (Days) 7 A FACE TO FACE EVALUATION WAS PERFORMED    Charlett Blake, MD 11/06/2016 7:44 AM

## 2016-11-06 NOTE — Progress Notes (Signed)
Physical Therapy Session Note  Patient Details  Name: Jonathan [name] MRN: [id number] Date of Birth: [birth year]  Today's Date: 11/06/2016 PT Individual Time:1500-1635   PT Individual Time Calculation: 56mn  Short Term Goals: Week 1:  PT Short Term Goal 1 (Week 1): STG=LTG due to ELOS  Skilled Therapeutic Interventions/Progress Updates:   Pt received supine in bed and agreeable to PT. Supine>sit transfer with out assistor cues   Gait training through simulated community environment of hospital, gift shop, food court, and at ascend 20steps to 2nd level of hospital. PT provided superivision assist with moderate cues for direction in unfamiliar environment and for improved use of signage. One rest break for total of 900 and 8075ffor each bout.   PT instructed patient in standing balance training on rocker board AP and lateral 2 bouts x 2 min, foot taps on 1 of 2 cones, progressed to 2 of 3 cones in sequential order. When asked to perform in sequential patient demonstrate difficulty with 2 step commands. Biodex weight shifting AP and Lateral x 2 min each, LOS x 4 progressive success from 16% to 30%, and random control and low diffuclty x 3 minutes. Patient able to demonstrate improved understanding of task with increased time.   Nustep level 4>6 x 12 minutes for endurance training. RPE 14/20 upon completion.   Standing therex with sqauts on bosu ball 2 bouts x 1 minutes, forward and side lunges to bosu ball 2x 20 BLE. With min assist from for safety and improved exercise technique.   Patient returned too room and left sitting EOB with call bell in reach and all needs met.        Therapy Documentation Precautions:  Precautions Precautions: Fall Restrictions Weight Bearing Restrictions: No Pain: Pain Assessment Pain Assessment: No/denies pain   See Function Navigator for Current Functional Status.   Therapy/Group: Individual Therapy  AuLorie Phenix/26/2018, 4:23 PM

## 2016-11-06 NOTE — Plan of Care (Signed)
Problem: RH Dressing Goal: LTG Patient will perform lower body dressing w/assist (OT) LTG: Patient will perform lower body dressing with assist, with/without cues in positioning using equipment (OT)  Downgraded as pt requires cues for sequencing and safety awareness

## 2016-11-06 NOTE — Progress Notes (Signed)
Speech Language Pathology Weekly Progress Note  Patient Details  Name: Jonathan [name] MRN: [id number] Date of Birth: 1929-06-09  Beginning of progress report period: October 30, 2016 End of progress report period: November 06, 2016    Short Term Goals: Week 1: SLP Short Term Goal 1 (Week 1): Pt will recognize and correct verbal errors during functional tasks with mod assist multimodal cues.   SLP Short Term Goal 1 - Progress (Week 1): Met SLP Short Term Goal 2 (Week 1): Pt will utilize compensatory word finding strategies during structured and unstructured language tasks with min assist verbal cues.   SLP Short Term Goal 2 - Progress (Week 1): Not met SLP Short Term Goal 3 (Week 1): Pt will follow multi-unit commands during functional tasks with min assist multimodal cues.   SLP Short Term Goal 3 - Progress (Week 1): Not met SLP Short Term Goal 4 (Week 1): Pt will sustain his attention to tasks for 3-5 minutes with min verbal cues for redirection.   SLP Short Term Goal 4 - Progress (Week 1): Met SLP Short Term Goal 5 (Week 1): Pt will utilize memory compensatory strategies to facilitate recall of daily information with mod assist multimodal cues.   SLP Short Term Goal 5 - Progress (Week 1): Met    New Short Term Goals: Week 2: SLP Short Term Goal 1 (Week 2): Pt will recognize and correct verbal errors during functional tasks with min assist multimodal cues.   SLP Short Term Goal 2 (Week 2): Pt will utilize compensatory word finding strategies during structured and unstructured language tasks with min assist verbal cues.   SLP Short Term Goal 3 (Week 2): Pt will follow multi-unit commands during functional tasks with min assist multimodal cues.   SLP Short Term Goal 4 (Week 2): Pt will sustain his attention to tasks for 5-7 minutes with min verbal cues for redirection.   SLP Short Term Goal 5 (Week 2): Pt will utilize memory compensatory strategies to facilitate recall of daily information  with min assist multimodal cues.    Weekly Progress Updates: Patient has made functional gains and has met 3 of 5 STG's this reporting period. Currently, patient requires overall Min-Mod A to complete functional and familiar tasks safely in regards to recall, attention and awareness. Patient follows multi-step commands with Mod A cues and is communicating fluently but with vague terminology and difficulty specifying target words and poor awareness of errors. Patient would benefit from continued skilled SLP intervention to maximize his cognitive-linguistic function and overall functional independence prior to discharge.      Intensity: Minumum of 1-2 x/day, 30 to 90 minutes Frequency: 3 to 5 out of 7 days Duration/Length of Stay: 1/30 Treatment/Interventions: Cognitive remediation/compensation;Cueing hierarchy;Functional tasks;Internal/external aids;Patient/family education;Multimodal communication approach;Speech/Language facilitation   Georganne Siple 11/06/2016, 4:22 PM

## 2016-11-06 NOTE — Progress Notes (Signed)
Occupational Therapy Weekly Progress Note  Patient Details  Name: Jonathan [name] MRN: [id number] Date of Birth: May 18, 1929  Beginning of progress report period: October 31, 2016 End of progress report period: November 06, 2016  Today's Date: 11/06/2016 OT Individual Time: 7564-3329 OT Individual Time Calculation (min): 45 min    Patient has met 4 of 4 short term goals.  Pt is making steady progress towards goals.  Pt currently requires min guard for ambulation and transfers due to quick pace and decreased awareness of deficits and safety awareness.  Pt overall supervision with bathing/dressing tasks with min cues for safety to sit for doffing/donning pants and sequencing for thoroughness with bathing. Pt continues to express visual impairments with intermittent double vision and "dark spots" in Rt eye.  Due to decreased memory and safety awareness, recommending overall 24/7 supervision at home.  Patient continues to demonstrate the following deficits: impaired timing and sequencing and decreased coordination and decreased attention, decreased awareness, decreased problem solving, decreased safety awareness, decreased memory and delayed processing, impaired vision and therefore will continue to benefit from skilled OT intervention to enhance overall performance with BADL and Reduce care partner burden.  Patient progressing toward long term goals..  Plan of care revisions: downgraded to supervision overall, except Mod I toileting.  OT Short Term Goals Week 1:  OT Short Term Goal 1 (Week 1): Groom standing at sink with setup and standby assist OT Short Term Goal 1 - Progress (Week 1): Met OT Short Term Goal 2 (Week 1): Complete bathing using DME with min assist for dynamic standing balance. OT Short Term Goal 2 - Progress (Week 1): Met OT Short Term Goal 3 (Week 1): Complete shower transfers with min assist for safe techique and hand placement OT Short Term Goal 3 - Progress (Week 1): Met OT  Short Term Goal 4 (Week 1): Demo improved memory and sequencing as evidenced by ability to provide setup direction to caregiver for dressing with min questioning cues OT Short Term Goal 4 - Progress (Week 1): Met Week 2:  OT Short Term Goal 1 (Week 2): STG = LTGs due to remaining LOS  Skilled Therapeutic Interventions/Progress Updates:    Treatment session with focus on family education regarding obstacle negotiation and increased awareness of obstacles in home environment as well as addressing diplopia in Rt visual field.  Ambulated to staff office with close supervision to complete transfers on/off office chairs as pt typically sits in rolling office chair at home.  Pt able to complete transfers on/off office chair both on carpeted surface and tile with and without UE support.  Able to negotiate various thresholds of carpet, tile, and area rugs without LOB.  Encouraged family to remove small throw rugs and secure area rugs to decrease tripping hazard. Engaged in simulated walk-in shower transfers with stepping over 6" yoga blocks to simulate home shower entrance with pt able to step over with close supervision.  Again discussed recommendation for grab bars in bathroom for additional safety.  Pt's daughter reports continued concerns regarding pt report of diplopia.  Assessed again with inconsistent report from pt due to impaired language as well as decreased awareness.  Pt completed line bisection activity with no errors.  Provided with "A" and encouraged pt to engage in visual scanning activity into Rt hemisphere and especially lower quadrant as pt did reports double vision in Rt lower quadrant.  Required increased time and cues for pt to comprehend activity, encouraged daughter to complete with pt due to  decreased understanding.  Therapy Documentation Precautions:  Precautions Precautions: Fall Restrictions Weight Bearing Restrictions: No General:   Vital Signs: Therapy Vitals Temp: 98.6 F (37  C) Temp Source: Oral Pulse Rate: 69 Resp: 18 BP: (!) 158/67 Patient Position (if appropriate): Lying Oxygen Therapy SpO2: 98 % O2 Device: Not Delivered Pain:   ADL: ADL ADL Comments: see Functional Assessment Tool Exercises:   Other Treatments:    See Function Navigator for Current Functional Status.   Therapy/Group: Individual Therapy  Simonne Come 11/06/2016, 7:50 AM

## 2016-11-06 NOTE — Progress Notes (Signed)
Speech Language Pathology Daily Session Note  Patient Details  Name: Jonathan [name] MRN: [id number] Date of Birth: May 06, 1929  Today's Date: 11/06/2016 SLP Individual Time: 1115-1200 SLP Individual Time Calculation (min): 45 min  Short Term Goals: Week 1: SLP Short Term Goal 1 (Week 1): Pt will recognize and correct verbal errors during functional tasks with mod assist multimodal cues.   SLP Short Term Goal 2 (Week 1): Pt will utilize compensatory word finding strategies during structured and unstructured language tasks with min assist verbal cues.   SLP Short Term Goal 3 (Week 1): Pt will follow multi-unit commands during functional tasks with min assist multimodal cues.   SLP Short Term Goal 4 (Week 1): Pt will sustain his attention to tasks for 3-5 minutes with min verbal cues for redirection.   SLP Short Term Goal 5 (Week 1): Pt will utilize memory compensatory strategies to facilitate recall of daily information with mod assist multimodal cues.    Skilled Therapeutic Interventions: Session addressed education with pt/dtr re: nature of pt's aphasia, ways to facilitate comprehension.  Pt communicating fluently but with vague terminology and difficulty specifying target words.  With verbal input only, pt demonstrates increased difficulty processing; with both written and verbal input, comprehension improves to >80% accuracy. Pt has tendency to perseverate on topics - verbal cues needed to shift set.  Followed multistep commands with mod assist.  Poor recognition of verbal errors; however, pt communicates, in general terms, that his memory is not functioning as well as it had before the stroke.  (His daughter conveyed that memory issues are chronic.)      Function:  Eating Eating                 Cognition Comprehension Comprehension assist level: Understands basic 75 - 89% of the time/ requires cueing 10 - 24% of the time  Expression   Expression assist level: Expresses basic 75 -  89% of the time/requires cueing 10 - 24% of the time. Needs helper to occlude trach/needs to repeat words.  Social Interaction Social Interaction assist level: Interacts appropriately 75 - 89% of the time - Needs redirection for appropriate language or to initiate interaction.  Problem Solving Problem solving assist level: Solves basic 75 - 89% of the time/requires cueing 10 - 24% of the time  Memory Memory assist level: Recognizes or recalls 50 - 74% of the time/requires cueing 25 - 49% of the time    Pain Pain Assessment Pain Assessment: No/denies pain Pain Score: 0-No pain  Therapy/Group: Individual Therapy  [name] 11/06/2016, 1:17 PM

## 2016-11-07 ENCOUNTER — Inpatient Hospital Stay (HOSPITAL_COMMUNITY): Payer: Medicare Other | Admitting: Speech Pathology

## 2016-11-07 LAB — GLUCOSE, CAPILLARY
GLUCOSE-CAPILLARY: 118 mg/dL — AB (ref 65–99)
GLUCOSE-CAPILLARY: 93 mg/dL (ref 65–99)
GLUCOSE-CAPILLARY: 264 mg/dL — AB (ref 65–99)
Glucose-Capillary: 98 mg/dL (ref 65–99)

## 2016-11-07 MED ORDER — LINACLOTIDE 145 MCG PO CAPS
290.0000 ug | ORAL_CAPSULE | Freq: Every day | ORAL | Status: DC
  Administered 2016-11-08 – 2016-11-10 (×3): 290 ug via ORAL
  Filled 2016-11-07 (×4): qty 2

## 2016-11-07 MED ORDER — BISACODYL 10 MG RE SUPP
10.0000 mg | Freq: Every day | RECTAL | Status: DC | PRN
  Administered 2016-11-07: 10 mg via RECTAL
  Filled 2016-11-07: qty 1

## 2016-11-07 MED ORDER — LINACLOTIDE 145 MCG PO CAPS
290.0000 ug | ORAL_CAPSULE | Freq: Once | ORAL | Status: AC
  Administered 2016-11-07: 290 ug via ORAL
  Filled 2016-11-07: qty 2

## 2016-11-07 MED ORDER — FLEET ENEMA 7-19 GM/118ML RE ENEM
1.0000 | ENEMA | Freq: Once | RECTAL | Status: AC
  Administered 2016-11-07: 1 via RECTAL
  Filled 2016-11-07: qty 1

## 2016-11-07 NOTE — Progress Notes (Signed)
Patient complained of stomach pain and need to have bowel movement. Sorbitol given in AM on night shift. Called MD for suppository, then fleet's enema. Attempted disimpaction. Bowel soft, patient had weak muscle tone to push bowel out. Disimpacted small volume. Patient still states doesn't feel better, "can't go" and is sensitive to touch in LLQ. Notified MD. PRN soap suds enema made available, orders to start Linzess given. Will report to oncoming RN.

## 2016-11-07 NOTE — Progress Notes (Signed)
Jonathan [name] is a 83 y.o. male [birth year] [id number]  Subjective: No new complaints. No new problems. Slept well. Feeling OK. Son is in the room.  Objective: Vital signs in last 24 hours: Temp:  [97.8 F (36.6 C)-98.8 F (37.1 C)] 97.8 F (36.6 C) (01/27 0615) Pulse Rate:  [69-87] 76 (01/27 0854) Resp:  [17-18] 17 (01/27 0615) BP: (103-134)/(48-77) 126/48 (01/27 0854) SpO2:  [95 %] 95 % (01/27 0615) Weight change:  Last BM Date: 11/02/16 (per last charted BM)  Intake/Output from previous day: 01/26 0701 - 01/27 0700 In: 720 [P.O.:720] Out: -  Last cbgs: CBG (last 3)   Recent Labs  11/06/16 1159 11/07/16 0639 11/07/16 1222  GLUCAP 105* 118* 93     Physical Exam General: No apparent distress   HEENT: not dry Lungs: Normal effort. Lungs clear to auscultation, no crackles or wheezes. Cardiovascular: Regular rate and rhythm, no edema Abdomen: S/NT/ND; BS(+) Musculoskeletal:  unchanged Neurological: No new neurological deficits Wounds: N/A    Skin: clear  Aging changes Mental state: Alert, cooperative    Lab Results: BMET    Component Value Date/Time   NA 138 11/01/2016 0851   K 4.1 11/01/2016 0851   CL 103 11/01/2016 0851   CO2 26 11/01/2016 0851   GLUCOSE 166 (H) 11/01/2016 0851   BUN 18 11/01/2016 0851   CREATININE 0.70 11/01/2016 0851   CALCIUM 9.0 11/01/2016 0851   GFRNONAA >60 11/01/2016 0851   GFRAA >60 11/01/2016 0851   CBC    Component Value Date/Time   WBC 8.6 11/01/2016 0851   RBC 4.91 11/01/2016 0851   HGB 12.7 (L) 11/01/2016 0851   HCT 39.1 11/01/2016 0851   PLT 258 11/01/2016 0851   MCV 79.6 11/01/2016 0851   MCH 25.9 (L) 11/01/2016 0851   MCHC 32.5 11/01/2016 0851   RDW 13.6 11/01/2016 0851   LYMPHSABS 1.3 11/01/2016 0851   MONOABS 0.5 11/01/2016 0851   EOSABS 0.1 11/01/2016 0851   BASOSABS 0.1 11/01/2016 0851    Studies/Results: No results found.  Medications: I have reviewed the patient's current  medications.  Assessment/Plan:  1.L thalamic CVA           CIR PT           Appears to have good family support 2. DVT proph - Eliquis 3. Pain Management: Tylenol as needed 4. Mood:  Hold off on meds for now -awaiting neuropsychology consult, may need to schedule for outpt if not seen by Monday  for cognitive/behavior evaulation/ family education 5. Neuropsych: This patient isnot capable of making decisions on hisown behalf. 6. Skin/Wound Care: Routine skin checks 7. Fluids/Electrolytes/Nutrition: Routine I&Os              BMET Labs(Brief)          Component Value Date/Time   NA 138 11/01/2016 0851   K 4.1 11/01/2016 0851   CL 103 11/01/2016 0851   CO2 26 11/01/2016 0851   GLUCOSE 166 (H) 11/01/2016 0851   BUN 18 11/01/2016 0851   CREATININE 0.70 11/01/2016 0851   CALCIUM 9.0 11/01/2016 0851   GFRNONAA >60 11/01/2016 0851   GFRAA >60 11/01/2016 0851      8.CAD - Imdur 9.PAF - Eliquis. Rate controlled 10.Dyslipidemia - on Rx 11. DM2 - on Rx           Appears to be improving CBG (last 3)   RecentLabs(last2labs)   Recent Labs  11/05/16 1214 11/05/16 2115 11/06/16 [id number]  GLUCAP  99 170* 103*    12.  Bowel and bladder without incont   Length of stay, days: 8  Walker Kehr , MD 11/07/2016, 2:50 PM

## 2016-11-07 NOTE — Progress Notes (Signed)
Speech Language Pathology Daily Session Note  Patient Details  Name: Jonathan [name] MRN: [id number] Date of Birth: 1929-05-05  Today's Date: 11/07/2016 SLP Individual Time: [id number] SLP Individual Time Calculation (min): 38 min  Short Term Goals: Week 2: SLP Short Term Goal 1 (Week 2): Pt will recognize and correct verbal errors during functional tasks with min assist multimodal cues.   SLP Short Term Goal 2 (Week 2): Pt will utilize compensatory word finding strategies during structured and unstructured language tasks with min assist verbal cues.   SLP Short Term Goal 3 (Week 2): Pt will follow multi-unit commands during functional tasks with min assist multimodal cues.   SLP Short Term Goal 4 (Week 2): Pt will sustain his attention to tasks for 5-7 minutes with min verbal cues for redirection.   SLP Short Term Goal 5 (Week 2): Pt will utilize memory compensatory strategies to facilitate recall of daily information with min assist multimodal cues.    Skilled Therapeutic Interventions: Skilled treatment session focused on cognition goals. SLP facilitated session by providing written information in addition to verbal information for orientation information and current situation. Pt required Max A cues to refer to written information. Pt able to sustain his attention to tasks for ~20 minutes with Mod I. Daughter present and education provided on pt's decreased safety awareness and need for 24 hour supervision. OT to help pt create written list of safe activities (ADL's at home) and list of activities that aren't safe for pt within home environment on next available date. Pt left sitting on edge of bed with family present. Continue with current plan of care.      Function:    Cognition Comprehension Comprehension assist level: Understands basic 75 - 89% of the time/ requires cueing 10 - 24% of the time  Expression   Expression assist level: Expresses basic 75 - 89% of the time/requires cueing  10 - 24% of the time. Needs helper to occlude trach/needs to repeat words.  Social Interaction Social Interaction assist level: Interacts appropriately 75 - 89% of the time - Needs redirection for appropriate language or to initiate interaction.  Problem Solving Problem solving assist level: Solves basic 75 - 89% of the time/requires cueing 10 - 24% of the time  Memory Memory assist level: Recognizes or recalls 50 - 74% of the time/requires cueing 25 - 49% of the time    Pain    Therapy/Group: Individual Therapy  Rozalia Dino B. Rutherford Nail, M.S., CCC-SLP Speech-Language Pathologist  [name] 11/07/2016, 4:01 PM

## 2016-11-08 ENCOUNTER — Inpatient Hospital Stay (HOSPITAL_COMMUNITY): Payer: Medicare Other

## 2016-11-08 LAB — GLUCOSE, CAPILLARY
GLUCOSE-CAPILLARY: 146 mg/dL — AB (ref 65–99)
GLUCOSE-CAPILLARY: 161 mg/dL — AB (ref 65–99)
Glucose-Capillary: 75 mg/dL (ref 65–99)
Glucose-Capillary: 153 mg/dL — ABNORMAL HIGH (ref 65–99)

## 2016-11-08 NOTE — Progress Notes (Signed)
Occupational Therapy Session Note  Patient Details  Name: Jonathan [name] MRN: [id number] Date of Birth: [birth year]  Today's Date: 11/08/2016 OT Individual Time: 1400-1450 OT Individual Time Calculation (min): 50 min    Short Term Goals: Week 2:  OT Short Term Goal 1 (Week 2): STG = LTGs due to remaining LOS  Skilled Therapeutic Interventions/Progress Updates: Therapeutic activity with focus on improved attention, awareness, orientation, problem-solving and family education on discharge recommendations relating to engagement in functional activities (family present during end of session).   Pt received supine in bed, dressed, with daughter and other relative present.   OT advised family on discussion with SLP with plan to observe pt during functional mobility to re-assess memory, orientation, safety awareness and dynamic balance in order to provide recommendations relating to safe activities within home environment.   Pt ambulates without device and requested access to bathroom to void urine prior to outing from unit.  Pt demo'd mild LOB although self-correcting and requires min vc for sequencing due to urgent need to urinate while standing at toilet.   Pt completed clothing management and hand hygiene and proceeded to ambulate to elevators with OT escort.   Pt follows cues appropriately for turns and views gift shop with OT.  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   Pt describes use of electric tools and former interest in home repairs and various shop activities although not specifically able to identify tool types or processes in any detail.   Pt proceeds to outdoor walkway in former main lobby area but requests urgent need to void BM and is escorted to public toilet.   Pt presented with mildly soild garment (small loose stool) and attempted to pass BM while seated on toilet although unsuccessfully.   Pt continues ambulation with escort to outdoor  area with additional mild LOB 2 times and continues vague discussion of historical interests but demo's word-finding deficits during conversations.   Pt eturns to his room at end of session for assist with change of underwear.   OT provides update to daughter on pt's overall presentation which includes poor insight into deficits (impaired intellectual awareness), poor memory, impulsivity, impaired problem-solving and error recognition, and impaired dynamic standing balance.   OT advises review with primary therapist on suggested activities although with recommendation to avoid use of power tools without direct supervision and assist or driving motor vehicle due to inattention to environment.   OT provides recommendation for home video monitor system and recommends 24/7 supervision for safety.     Therapy Documentation Precautions:  Precautions Precautions: Fall Restrictions Weight Bearing Restrictions: No  Vital Signs: Therapy Vitals Temp: 98.4 F (36.9 C) Temp Source: Oral Pulse Rate: 89 Resp: 17 BP: 114/68 Patient Position (if appropriate): Lying Oxygen Therapy SpO2: 98 % O2 Device: Not Delivered   Pain: No/deneis pain  ADL: ADL ADL Comments: see Functional Assessment Tool  See Function Navigator for Current Functional Status.   Therapy/Group: Individual Therapy  [place] 11/08/2016, 3:49 PM

## 2016-11-08 NOTE — Progress Notes (Signed)
Jonathan [name] is a 83 y.o. male 1928/12/16 [id number]  Subjective: No complaints. He had a good BM  No new problems. Slept well. Feeling OK.  Objective: Vital signs in last 24 hours: Temp:  [97.5 F (36.4 C)-99 F (37.2 C)] 99 F (37.2 C) (01/28 0611) Pulse Rate:  [92-99] 99 (01/28 0611) Resp:  [16] 16 (01/28 0611) BP: (129-146)/(66-81) 129/66 (01/28 0611) SpO2:  [94 %-96 %] 94 % (01/28 0611) Weight change:  Last BM Date: 11/07/16 (via disimpaction, still constipated)  Intake/Output from previous day: 01/27 0701 - 01/28 0700 In: 120 [P.O.:120] Out: -  Last cbgs: CBG (last 3)   Recent Labs  11/07/16 1634 11/07/16 2041 11/08/16 0656  GLUCAP 264* 98 146*     Physical Exam General: No apparent distress  Dtr is in the room HEENT: not dry Lungs: Normal effort. Lungs clear to auscultation, no crackles or wheezes. Cardiovascular: Regular rate and rhythm, no edema Abdomen: S/NT/ND; BS(+) Musculoskeletal:  unchanged Neurological: No new neurological deficits Wounds: N/A    Skin: clear  Aging changes Mental state: Alert, cooperative    Lab Results: BMET    Component Value Date/Time   NA 138 11/01/2016 0851   K 4.1 11/01/2016 0851   CL 103 11/01/2016 0851   CO2 26 11/01/2016 0851   GLUCOSE 166 (H) 11/01/2016 0851   BUN 18 11/01/2016 0851   CREATININE 0.70 11/01/2016 0851   CALCIUM 9.0 11/01/2016 0851   GFRNONAA >60 11/01/2016 0851   GFRAA >60 11/01/2016 0851   CBC    Component Value Date/Time   WBC 8.6 11/01/2016 0851   RBC 4.91 11/01/2016 0851   HGB 12.7 (L) 11/01/2016 0851   HCT 39.1 11/01/2016 0851   PLT 258 11/01/2016 0851   MCV 79.6 11/01/2016 0851   MCH 25.9 (L) 11/01/2016 0851   MCHC 32.5 11/01/2016 0851   RDW 13.6 11/01/2016 0851   LYMPHSABS 1.3 11/01/2016 0851   MONOABS 0.5 11/01/2016 0851   EOSABS 0.1 11/01/2016 0851   BASOSABS 0.1 11/01/2016 0851    Studies/Results: No results found.  Medications: I have reviewed the patient's  current medications.  A/P:  1. Left thalamic CVA continue with inpatient rehabilitation services. Excellent family support. 2. DVT prophylaxis with Eliquis 3. Pain management with Tylenol as needed 4. Coronary artery disease continue with Imdur 5. Paroxysmal atrial fibrillation continue with Eliquis, rate is controlled 6. Constipation - better   Length of stay, days: 9  Walker Kehr , MD 11/08/2016, 9:52 AM

## 2016-11-09 ENCOUNTER — Inpatient Hospital Stay (HOSPITAL_COMMUNITY): Payer: Medicare Other | Admitting: Speech Pathology

## 2016-11-09 ENCOUNTER — Inpatient Hospital Stay (HOSPITAL_COMMUNITY): Payer: Medicare Other | Admitting: Physical Therapy

## 2016-11-09 ENCOUNTER — Inpatient Hospital Stay (HOSPITAL_COMMUNITY): Payer: Medicare Other | Admitting: Occupational Therapy

## 2016-11-09 LAB — GLUCOSE, CAPILLARY
GLUCOSE-CAPILLARY: 124 mg/dL — AB (ref 65–99)
GLUCOSE-CAPILLARY: 132 mg/dL — AB (ref 65–99)
GLUCOSE-CAPILLARY: 126 mg/dL — AB (ref 65–99)
GLUCOSE-CAPILLARY: 129 mg/dL — AB (ref 65–99)
Glucose-Capillary: 102 mg/dL — ABNORMAL HIGH (ref 65–99)
Glucose-Capillary: 139 mg/dL — ABNORMAL HIGH (ref 65–99)

## 2016-11-09 NOTE — Progress Notes (Signed)
[facility]     PROGRESS NOTE    Subjective/Complaints: No issues overnite, son has questions regarding d/c process  ROS: limited by cognition, but appears to deny CP, SOB, N/V/D.  Objective: Vital Signs: Blood pressure (!) 147/69, pulse 84, temperature 97.7 F (36.5 C), temperature source Oral, resp. rate 17, weight 77.4 kg (170 lb 9.6 oz), SpO2 96 %. No results found. No results for input(s): WBC, HGB, HCT, PLT in the last 72 hours. No results for input(s): NA, K, CL, GLUCOSE, BUN, CREATININE, CALCIUM in the last 72 hours.  Invalid input(s): CO CBG (last 3)   Recent Labs  11/08/16 1651 11/08/16 2034 11/09/16 0652  GLUCAP 153* 161* 102*    Wt Readings from Last 3 Encounters:  11/04/16 77.4 kg (170 lb 9.6 oz)  10/28/16 74.8 kg (164 lb 14.4 oz)  02/13/16 79.4 kg (175 lb)    Physical Exam:  Constitutional: He appears well-developedand well-nourished. NAD.  HENT: Normocephalicand atraumatic.  Eyes: EOMare normal. No discharge.  Cardiovascular: RRR. No JVDpresent. Respiratory: Effort normaland breath sounds normal. GI: Soft. Bowel sounds are normal.  Musculoskeletal: He exhibits no edema. Dupuytren's contracture RUE.  Skin warm and dry Neurological: He is alert and oriented x1.  Limited insight and awareness.  Delays in processing.  Follows simple commands inconsistently.    Motor 4+/5 RUE and 4/5 RLE with apraxia LUE and LLE 4+-5/5.  Psych: flat, disengaged   Assessment/Plan: 1. Right hemisensory and cognitive deficits secondary to left thalamic infarct which require 3+ hours per day of interdisciplinary therapy in a comprehensive inpatient rehab setting. Physiatrist is providing close team supervision and 24 hour management of active medical problems listed below. Physiatrist and rehab team continue to assess barriers to discharge/monitor patient progress toward functional and medical goals.  Function:  Bathing Bathing  position   Position: Shower  Bathing parts Body parts bathed by patient: Right arm, Left arm, Chest, Abdomen, Front perineal area, Right upper leg, Left upper leg, Right lower leg, Left lower leg, Buttocks, Back Body parts bathed by helper: Back  Bathing assist Assist Level: Supervision or verbal cues      Upper Body Dressing/Undressing Upper body dressing   What is the patient wearing?: Pull over shirt/dress     Pull over shirt/dress - Perfomed by patient: Thread/unthread right sleeve, Thread/unthread left sleeve, Put head through opening, Pull shirt over trunk          Upper body assist Assist Level: Set up   Set up : To obtain clothing/put away  Lower Body Dressing/Undressing Lower body dressing   What is the patient wearing?: Underwear, Pants, Socks, Shoes Underwear - Performed by patient: Thread/unthread right underwear leg, Thread/unthread left underwear leg, Pull underwear up/down   Pants- Performed by patient: Thread/unthread right pants leg, Thread/unthread left pants leg, Pull pants up/down, Fasten/unfasten pants   Non-skid slipper socks- Performed by patient: Don/doff right sock, Don/doff left sock   Socks - Performed by patient: Don/doff right sock, Don/doff left sock   Shoes - Performed by patient: Don/doff right shoe, Don/doff left shoe       TED Hose - Performed by patient: Don/doff left TED hose TED Hose - Performed by helper: Don/doff right TED hose  Lower body assist Assist for lower body dressing: Supervision or verbal cues      Toileting Toileting   Toileting steps completed by patient: Adjust clothing prior to toileting, Performs perineal hygiene, Adjust clothing after toileting Toileting steps completed by helper:  Adjust clothing prior to toileting, Performs perineal hygiene, Adjust clothing after toileting Toileting Assistive Devices: Grab bar or rail  Toileting assist Assist level: Supervision or verbal cues   Transfers Chair/bed transfer    Chair/bed transfer method: Stand pivot Chair/bed transfer assist level: Supervision or verbal cues Chair/bed transfer assistive device: Armrests     Locomotion Ambulation     Max distance: 989ft  Assist level: Supervision or verbal cues   Wheelchair   Type: Manual Max wheelchair distance: 197ft  Assist Level: Supervision or verbal cues  Cognition Comprehension Comprehension assist level: Understands basic 75 - 89% of the time/ requires cueing 10 - 24% of the time  Expression Expression assist level: Expresses basic 75 - 89% of the time/requires cueing 10 - 24% of the time. Needs helper to occlude trach/needs to repeat words.  Social Interaction Social Interaction assist level: Interacts appropriately 75 - 89% of the time - Needs redirection for appropriate language or to initiate interaction.  Problem Solving Problem solving assist level: Solves basic 75 - 89% of the time/requires cueing 10 - 24% of the time  Memory Memory assist level: Recognizes or recalls 50 - 74% of the time/requires cueing 25 - 49% of the time   Medical Problem List and Plan: 1. Right hemisensory deficits/cognitive deficitssecondary to left thalamic infarct  CT/MRI reviewed, CT unremarkable, MRI showing thalamic lesion  CIR PT, OT< SLP plan d/c 1/30  Appears to have good family support 2. DVT Prophylaxis/Anticoagulation: Eliquis. 3. Pain Management: Tylenol as needed 4. Mood:  Hold off on meds for now -awaiting neuropsychology consult, may need to schedule for outpt if not seen by Monday  for cognitive/behavior evaulation/ family education 5. Neuropsych: This patient isnot capable of making decisions on hisown behalf. 6. Skin/Wound Care: Routine skin checks 7. Fluids/Electrolytes/Nutrition: Routine I&Os   BMET    Component Value Date/Time   NA 138 11/01/2016 0851   K 4.1 11/01/2016 0851   CL 103 11/01/2016 0851   CO2 26 11/01/2016 0851   GLUCOSE 166 (H) 11/01/2016 0851   BUN 18 11/01/2016 0851    CREATININE 0.70 11/01/2016 0851   CALCIUM 9.0 11/01/2016 0851   GFRNONAA >60 11/01/2016 0851   GFRAA >60 11/01/2016 0851    8.CAD status post CABG. Continue aspirin,Continue Imdur 15 mg daily 9.PAF. Maintain on Eliquis. Rate controlled 10.Hyperlipidemia. Pravachol 11. DM 2--fair control at present           -pt on glucotrol at home--increased to 5mg  CBG (last 3)   Recent Labs  11/08/16 1651 11/08/16 2034 11/09/16 0652  GLUCAP 153* 161* 102*              -ssi           -CM diet  Appears to be improving CBG (last 3)   Recent Labs  11/08/16 1651 11/08/16 2034 11/09/16 0652  GLUCAP 153* 161* 102*  12.  Bowel and bladder had one incont stool over the weekend  LOS (Days) 10 A FACE TO FACE EVALUATION WAS PERFORMED    Charlett Blake, MD 11/09/2016 7:21 AM

## 2016-11-09 NOTE — Progress Notes (Signed)
Speech Language Pathology Discharge Summary  Patient Details  Name: Jonathan [name] MRN: [id number] Date of Birth: May 12, 1929  Today's Date: 11/09/2016  Session 1 SLP Individual Time: 1130-1200 SLP Individual Time Calculation (min): 30 min Session 2 SLP Individual Time: 9622-2979 SLP Individual Time Calculation (min): 60 min   Skilled Therapeutic Interventions:   Session 1 Skilled treatment session focused on addressing communication goals. Patient completed the Western Aphasia Battery Bedside Assessment, with significantly improved receptive and expressive abilities with basic tasks requiring only Supervision assist.  Repetition intact; however, when errors occur they are perseverative in nature and patient has limited awareness.  Son present and took notes.  Patient performed better on concrete tasks and had more difficulty with abstract tasks or things that required mental flexibility.     Session 2  Skilled treatment session focused on addressing education regarding ongoing cognitive-linguistic deficits. SLP facilitated session by providing examples of home management for reading and writing tasks to for home carryover and success.  Patient with decreased recall and awareness of deficits at this session and required Mod cues for recall of deficits and Max cues for mental flexibility with home management discussions.  Son present and asked appropriate questions, given that he has been present throughout stay he reports having a good handle on it.  SLP emphasized the need for 24/7 supervision upon discharge and son agreed.     Patient has met 4 of 6 long term goals.  Patient to discharge at overall Supervision;Mod level.  Reasons goals not met: recall impacts awareness and patient continues to require Mod assist for recall   Clinical Impression/Discharge Summary:    Patient has made functional gains during this rehab admission and has met 4 out of 6 long term goals due to improved  functional abilities.  Patient is currently an overall Supervision assist for basic communication, Min assist for mildly complex tasks, Mod for recall, and Max for awareness. Patient and family education has been completed and patient will discharge home with 24 hour supervision. Patient would benefit from follow up SLP services to continue efforts to maximize cognitive-linguistic skills to further his functional independence and reduce the burden of care.   Care Partner:  Caregiver Able to Provide Assistance: Yes  Type of Caregiver Assistance: Cognitive  Recommendation:  24 hour supervision/assistance;Home Health SLP;Outpatient SLP  Rationale for SLP Follow Up: Maximize functional communication;Maximize cognitive function and independence;Reduce caregiver burden   Equipment: none   Reasons for discharge: Treatment goals met;Discharged from hospital   Patient/Family Agrees with Progress Made and Goals Achieved: Yes   Function:  Cognition Comprehension Comprehension assist level: Follows basic conversation/direction with extra time/assistive device  Expression   Expression assist level: Expresses basic 90% of the time/requires cueing < 10% of the time.  Social Interaction Social Interaction assist level: Interacts appropriately 90% of the time - Needs monitoring or encouragement for participation or interaction.  Problem Solving Problem solving assist level: Solves basic 90% of the time/requires cueing < 10% of the time  Memory Memory assist level: Recognizes or recalls 50 - 74% of the time/requires cueing 25 - 49% of the time   Carmelia Roller., [place]  Georgeann Brinkman 11/09/2016, 5:19 PM

## 2016-11-09 NOTE — Progress Notes (Signed)
Occupational Therapy Session Note  Patient Details  Name: MARDEN TUNG MRN: [id number] Date of Birth: 1929-08-27  Today's Date: 11/09/2016 OT Individual Time: [id number] and [id number] OT Individual Time Calculation (min): 57 min and 34 min   Short Term Goals: Week 2:  OT Short Term Goal 1 (Week 2): STG = LTGs due to remaining LOS  Skilled Therapeutic Interventions/Progress Updates:    1) Completed ADL retraining at overall Supervision level.  Pt completed bathing and dressing at supervision level with min cues for sequencing with thoroughness in bathing at shower level as well as cues for safety to sit to complete LB dressing.  Engaged in discussion with pt and pt's son regarding safety at home with previous hobbies in garage and workshop, recommending supervision/assistance with any use of tools especially power tools.  As pt very motivated by doing handiwork.  Engaged in visual assessment, with pt continuing to report double vision in Rt lower quadrant.  Educated on exercises as well as compensatory techniques to increase vision during functional tasks.  2) Completed family education with pt and pt's son regarding safety in home environment as well as providing handouts regarding vision impairments and exercises.  Pt received on toilet with son providing intermittent supervision.  Son reports ready for d/c home and plans to provide supervision for initial few days as they finalize their plans.  Provided information regarding use of baby monitors vs whole home surveillance.  Provided pt's son with additional information regarding diplopia and provided handouts for visual scanning and saccades.  Pt continues to report diplopia in Rt lower quadrant, but does not impact his functional mobility or reading.  Pt perseverative on bowel issues throughout session.  Therapy Documentation Precautions:  Precautions Precautions: Fall Precaution Comments: balance improving but continues to demo some  deficits with higher level activities Restrictions Weight Bearing Restrictions: No Pain: Pain Assessment Pain Assessment: No/denies pain  See Function Navigator for Current Functional Status.   Therapy/Group: Individual Therapy  Simonne Come 11/09/2016, 12:14 PM

## 2016-11-09 NOTE — Plan of Care (Signed)
Problem: RH Memory Goal: LTG Patient will use memory compensatory aids to (SLP) LTG:  Patient will use memory compensatory aids to recall biographical/new, daily complex information with cues (SLP)  Outcome: Not Met (add Reason) Patient continues to require Mod assist   Problem: RH Awareness Goal: LTG: Patient will demonstrate intellectual/emergent (SLP) LTG: Patient will demonstrate intellectual/emergent/anticipatory awareness with assist during a cognitive/linguistic activity  (SLP)  Outcome: Not Met (add Reason) Unable to recall stroke or deficits without Mod assist due to recall deficits

## 2016-11-09 NOTE — Discharge Summary (Signed)
Discharge summary job # [id number]

## 2016-11-09 NOTE — Progress Notes (Signed)
Occupational Therapy Discharge Summary  Patient Details  Name: Jonathan [name] MRN: [id number] Date of Birth: 12-24-1928  Patient has met 9 of 12 long term goals due to improved activity tolerance, improved balance, ability to compensate for deficits, improved attention and improved coordination.  Patient to discharge at overall Supervision level.  Patient's care partner is independent to provide the necessary cognitive assistance at discharge.    Reasons goals not met: Pt continues to require supervision with toilet transfers secondary to decreased intellectual and emergent awareness as well as decreased memory - requiring mod-max cues for safety.  Recommendation:  Patient will benefit from ongoing skilled OT services in home health setting to continue to advance functional skills in the area of BADL and Reduce care partner burden.  Equipment: No equipment provided  Reasons for discharge: treatment goals met and discharge from hospital  Patient/family agrees with progress made and goals achieved: Yes  OT Discharge Precautions/Restrictions  Precautions Precautions: Fall Precaution Comments: balance improving but continues to demo some deficits with higher level activities Restrictions Weight Bearing Restrictions: No Pain Pain Assessment Pain Assessment: No/denies pain ADL ADL ADL Comments: see Functional Assessment Tool Vision/Perception  Vision- History Baseline Vision/History: No visual deficits Patient Visual Report: Blurring of vision (some diplopia with end range R lower quadrant tracking, but reports disappears with head turn to that direction) Vision- Assessment Vision Assessment?: Vision impaired- to be further tested in functional context Pt reports diplopia in end range Rt lower quadrant tracking, reports it disappears with head turns Perception Comments: WFL  Cognition Overall Cognitive Status: Impaired/Different from baseline Arousal/Alertness:  Awake/alert Orientation Level: Oriented to person;Oriented to place;Oriented to time;Disoriented to situation Attention: Selective Focused Attention: Appears intact Sustained Attention: Appears intact Selective Attention: Appears intact Memory: Impaired Memory Impairment: Storage deficit;Retrieval deficit;Decreased recall of new information Awareness: Impaired Awareness Impairment: Intellectual impairment Problem Solving: Impaired Safety/Judgment: Impaired Sensation Sensation Light Touch: Appears Intact Proprioception: Appears Intact Coordination Gross Motor Movements are Fluid and Coordinated: Yes Fine Motor Movements are Fluid and Coordinated: No Coordination and Movement Description: Diminished FMC of bilateral hands Finger Nose Finger Test: mild Decreased speed on the L compared to the R  Heel Shin Test: normal, R=L 9 Hole Peg Test: Rt: 38 seconds, Lt: 37 seconds Motor  Motor Motor: Abnormal postural alignment and control Motor - Discharge Observations: improved gait, continues with R trunk collapse in R stance phase Mobility  Bed Mobility Rolling Right: 5: Supervision;With rail Rolling Left: 5: Supervision;With rail Supine to Sit: 5: Supervision;With rails Sitting - Scoot to Edge of Bed: 5: Supervision Sit to Supine: 5: Supervision Transfers Sit to Stand: 5: Supervision  Trunk/Postural Assessment  Cervical Assessment Cervical Assessment: Within Functional Limits Thoracic Assessment Thoracic Assessment: Within Functional Limits Lumbar Assessment Lumbar Assessment: Within Functional Limits Postural Control Postural Control: Within Functional Limits  Balance Balance Balance Assessed: Yes Standardized Balance Assessment Standardized Balance Assessment: Berg Balance Test Berg Balance Test Sit to Stand: Able to stand without using hands and stabilize independently Standing Unsupported: Able to stand safely 2 minutes Sitting with Back Unsupported but Feet Supported  on Floor or Stool: Able to sit safely and securely 2 minutes Stand to Sit: Sits safely with minimal use of hands Transfers: Able to transfer safely, minor use of hands Standing Unsupported with Eyes Closed: Able to stand 10 seconds with supervision Standing Ubsupported with Feet Together: Able to place feet together independently and stand for 1 minute with supervision From Standing, Reach Forward with Outstretched Arm: Can reach confidently >  25 cm (10") From Standing Position, Pick up Object from Floor: Able to pick up shoe safely and easily From Standing Position, Turn to Look Behind Over each Shoulder: Looks behind one side only/other side shows less weight shift Turn 360 Degrees: Able to turn 360 degrees safely in 4 seconds or less Standing Unsupported, Alternately Place Feet on Step/Stool: Able to stand independently and complete 8 steps >20 seconds Standing Unsupported, One Foot in Front: Able to take small step independently and hold 30 seconds Standing on One Leg: Able to lift leg independently and hold equal to or more than 3 seconds Total Score: 48 Extremity/Trunk Assessment RUE Assessment RUE Assessment: Within Functional Limits LUE Assessment LUE Assessment: Within Functional Limits   See Function Navigator for Current Functional Status.  Simonne Come 11/09/2016, 3:31 PM

## 2016-11-09 NOTE — Plan of Care (Signed)
Problem: RH Memory Goal: LTG Patient demonstrate ability for day to day recall (PT) LTG:  Patient will demonstrate ability for day to day recall/carryover during mobility activities with assist (PT)  Outcome: Not Met (add Reason) Continues to require max cues for recall  Problem: RH Awareness Goal: LTG: Patient will demonstrate intellectual/emergent (PT) LTG: Patient will demonstrate intellectual/emergent/anticipatory awareness with assist during a mobility activity  (PT)  Outcome: Not Met (add Reason) Not able to recall any deficits without max cues    

## 2016-11-09 NOTE — Progress Notes (Signed)
Physical Therapy Discharge Summary  Patient Details  Name: Jonathan [name] MRN: [id number] Date of Birth: 1929-08-01  Today's Date: 11/09/2016 PT Individual Time: 1000-1055 PT Individual Time Calculation (min): 55 min    Patient has met 11 of 13 long term goals due to improved activity tolerance, improved balance, improved postural control, increased strength, ability to compensate for deficits, improved attention, improved awareness and improved coordination.  Patient to discharge at an ambulatory level Supervision.   Patient's care partner is independent to provide the necessary cognitive assistance at discharge.  Reasons goals not met:  Pt continues to require up to max assist for cognition including intellectual/emergent/anticipatory awareness and recall of new information.   Recommendation:  Patient will benefit from ongoing skilled PT services in home health setting to continue to advance safe functional mobility, address ongoing impairments in balance and cognition, and minimize fall risk.  Equipment: No equipment provided  Reasons for discharge: treatment goals met  Patient/family agrees with progress made and goals achieved: Yes   Skilled therapeutic intervention: No c/o pain.    Session focus on reassessment of balance, sensation, coordination, mobility, and cognition.  Pt currently performing all mobility at supervision level with cues for safety.  PT administered Berg Balance Scale and patient demonstrates moderate fall risk as noted by score of 48/56 on Berg Balance Scale.  Pt has improved score by 11 points and PT provided interpretation of results to pt and his son.  PT instructed pt and sons in falls recovery with overall supervision.  Path finding throughout unit with min question cues.  Pt returned to room at end of session and positioned in recliner with family present and needs met.    PT Discharge Precautions/Restrictions Precautions Precautions: Fall Precaution  Comments: balance improving but continues to demo some deficits with higher level activities Restrictions Weight Bearing Restrictions: No Pain Pain Assessment Pain Assessment: No/denies pain Vision/Perception  Perception Comments: WFL  Cognition Overall Cognitive Status: Impaired/Different from baseline Arousal/Alertness: Awake/alert Orientation Level: Oriented to person;Oriented to place;Oriented to time;Disoriented to situation Attention: Selective Focused Attention: Appears intact Sustained Attention: Appears intact Selective Attention: Appears intact Memory: Impaired Memory Impairment: Storage deficit;Retrieval deficit;Decreased recall of new information Awareness: Impaired Awareness Impairment: Intellectual impairment Problem Solving: Impaired Safety/Judgment: Impaired Sensation Sensation Light Touch: Appears Intact Coordination Gross Motor Movements are Fluid and Coordinated: Yes Fine Motor Movements are Fluid and Coordinated: Yes Heel Shin Test: normal, R=L Motor  Motor Motor: Abnormal postural alignment and control Motor - Discharge Observations: improved gait, continues with R trunk collapse in R stance phase  Mobility Bed Mobility Rolling Right: 5: Supervision;With rail Rolling Left: 5: Supervision;With rail Supine to Sit: 5: Supervision;With rails Sitting - Scoot to Edge of Bed: 5: Supervision Sit to Supine: 5: Supervision Transfers Transfers: Yes Sit to Stand: 5: Supervision Stand Pivot Transfers: 5: Supervision Locomotion  Ambulation Ambulation: Yes Ambulation/Gait Assistance: 5: Supervision Ambulation Distance (Feet): 300 Feet Assistive device: None Gait Gait: Yes Gait Pattern: Impaired Gait Pattern: Step-to pattern;Decreased stride length Trunk - Stance Phase - Impaired Gait Pattern: Lateral lean (comment) Gait velocity: decreased Stairs / Additional Locomotion Stairs: Yes Stairs Assistance: 5: Supervision Stair Management Technique: Two  rails;One rail Right Number of Stairs: 12 Height of Stairs: 6 Ramp: 5: Supervision Curb: 5: Supervision Wheelchair Mobility Wheelchair Mobility: No  Trunk/Postural Assessment  Cervical Assessment Cervical Assessment: Within Functional Limits Thoracic Assessment Thoracic Assessment: Within Functional Limits Lumbar Assessment Lumbar Assessment: Within Functional Limits Postural Control Postural Control: Within Functional Limits  Balance  Balance Balance Assessed: Yes Standardized Balance Assessment Standardized Balance Assessment: Berg Balance Test Berg Balance Test Sit to Stand: Able to stand without using hands and stabilize independently Standing Unsupported: Able to stand safely 2 minutes Sitting with Back Unsupported but Feet Supported on Floor or Stool: Able to sit safely and securely 2 minutes Stand to Sit: Sits safely with minimal use of hands Transfers: Able to transfer safely, minor use of hands Standing Unsupported with Eyes Closed: Able to stand 10 seconds with supervision Standing Ubsupported with Feet Together: Able to place feet together independently and stand for 1 minute with supervision From Standing, Reach Forward with Outstretched Arm: Can reach confidently >25 cm (10") From Standing Position, Pick up Object from Floor: Able to pick up shoe safely and easily From Standing Position, Turn to Look Behind Over each Shoulder: Looks behind one side only/other side shows less weight shift Turn 360 Degrees: Able to turn 360 degrees safely in 4 seconds or less Standing Unsupported, Alternately Place Feet on Step/Stool: Able to stand independently and complete 8 steps >20 seconds Standing Unsupported, One Foot in Front: Able to take small step independently and hold 30 seconds Standing on One Leg: Able to lift leg independently and hold equal to or more than 3 seconds Total Score: 48 Extremity Assessment      RLE Assessment RLE Assessment: Within Functional Limits  (5/5 throughout) LLE Assessment LLE Assessment: Within Functional Limits (5/5 throughout)   See Function Navigator for Current Functional Status.  [name] E Penven-Crew 11/09/2016, 10:38 AM

## 2016-11-10 ENCOUNTER — Inpatient Hospital Stay (HOSPITAL_COMMUNITY): Payer: Medicare Other

## 2016-11-10 LAB — URINALYSIS, ROUTINE W REFLEX MICROSCOPIC
Bilirubin Urine: NEGATIVE
Glucose, UA: NEGATIVE mg/dL
HGB URINE DIPSTICK: NEGATIVE
Ketones, ur: NEGATIVE mg/dL
LEUKOCYTES UA: NEGATIVE
Nitrite: NEGATIVE
PROTEIN: NEGATIVE mg/dL
SPECIFIC GRAVITY, URINE: 1.023 (ref 1.005–1.030)
pH: 5 (ref 5.0–8.0)

## 2016-11-10 LAB — CBC
HEMATOCRIT: 40 % (ref 39.0–52.0)
HEMOGLOBIN: 13.1 g/dL (ref 13.0–17.0)
MCH: 26.3 pg (ref 26.0–34.0)
MCHC: 32.8 g/dL (ref 30.0–36.0)
MCV: 80.2 fL (ref 78.0–100.0)
Platelets: 259 10*3/uL (ref 150–400)
RBC: 4.99 MIL/uL (ref 4.22–5.81)
RDW: 13.6 % (ref 11.5–15.5)
WBC: 8.3 10*3/uL (ref 4.0–10.5)

## 2016-11-10 LAB — GLUCOSE, CAPILLARY: Glucose-Capillary: 104 mg/dL — ABNORMAL HIGH (ref 65–99)

## 2016-11-10 MED ORDER — GLIPIZIDE 5 MG PO TABS: 5.0000 mg | ORAL_TABLET | Freq: Every day | ORAL | 0 refills | Status: AC

## 2016-11-10 MED ORDER — PRAVASTATIN SODIUM 20 MG PO TABS: 20.0000 mg | ORAL_TABLET | Freq: Every day | ORAL | 1 refills | Status: AC

## 2016-11-10 MED ORDER — LINACLOTIDE 290 MCG PO CAPS: 290.0000 ug | ORAL_CAPSULE | Freq: Every day | ORAL | 1 refills | Status: AC

## 2016-11-10 MED ORDER — APIXABAN 5 MG PO TABS
5.0000 mg | ORAL_TABLET | Freq: Two times a day (BID) | ORAL | 0 refills | Status: AC
Start: 2016-11-10 — End: ?

## 2016-11-10 MED ORDER — ISOSORBIDE MONONITRATE ER 30 MG PO TB24: 15.0000 mg | ORAL_TABLET | Freq: Every day | ORAL | 1 refills | Status: AC

## 2016-11-10 NOTE — Discharge Summary (Signed)
NAME:  Jonathan [name], Jonathan [name] NO.:  [id number]  MEDICAL RECORD NO.:  [id number]  LOCATION:  [id number]                        FACILITY:  [facility]  PHYSICIAN:  Charlett Blake, M.D.DATE OF BIRTH:  [birth year]  DATE OF ADMISSION:  10/30/2016 DATE OF DISCHARGE:  11/10/2016                              DISCHARGE SUMMARY   DISCHARGE DIAGNOSES: 1. Left thalamic infarct. 2. Eliquis for deep venous thrombosis prophylaxis. 3. Pain management. 4. Coronary artery disease with coronary artery bypass graft. 5. Paroxysmal atrial fibrillation. 6. Hyperlipidemia. 7. Type 2 diabetes mellitus. 8. Constipation, resolved.  HISTORY OF PRESENT ILLNESS:  This is an 83 year old right-handed male, history of CAD with CABG, 2007; hypertension; PAF, on Xarelto in the past.  Presented on October 28, 2016, with altered mental status, right- sided weakness, slurred speech.  He lives with his 17 year old wife, independent prior to admission, still driving.  Son lives next door. MRI revealed acute 15 x 11-mm left thalamus infarct.  CT angiogram of the head and neck showed right posterior cerebral artery P1 occlusion. Atherosclerotic type changes.  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  The patient did not receive tPA.  Echocardiogram with ejection fraction of [id number], grade 1 diastolic dysfunction.  EEG suggestive of possible mild encephalopathy, negative for seizure.  Neurology consulted.  Maintained on Eliquis as well as low-dose aspirin for CVA prophylaxis.  Physical and occupational therapy ongoing.  The patient was admitted for comprehensive rehab program.  PAST MEDICAL HISTORY:  See discharge diagnoses.  SOCIAL HISTORY:  Lives with 34 year old wife, has a son next door, who can assist.  FUNCTIONAL STATUS:  Upon admission to [facility], was minimal assist, 110 feet, rolling walker; minimal assist, sit to supine; min-to- mod assist, activities of daily living.  PHYSICAL  EXAMINATION:  VITAL SIGNS:  Blood pressure 164/82, pulse 69, temperature 97, respirations 20. GENERAL:  This was an alert male, well developed. HEENT:  EOMs intact.  Pupils, round and reactive to light. LUNGS:  Clear to auscultation without wheeze. CARDIAC:  Irregularly irregular. ABDOMEN:  Soft, nontender.  Good bowel sounds. NEUROLOGIC:  The patient was alert, oriented, followed simple commands. He had some decreased awareness of his deficits.  REHABILITATION HOSPITAL COURSE:  The patient was admitted to Inpatient Rehab Services with therapies initiated on a 3-hour daily basis, consisting of physical therapy, occupational therapy, speech therapy and rehabilitation nursing.  The following issues were addressed during the patient's rehabilitation stay.  Pertaining to Mr. Gregori, left thalamic infarct remained stable.  He remained on low-dose aspirin as well as Eliquis.  He would follow up with Neurology Services.  He had no chest pain or shortness of breath with noted history of CAD with CABG. Cardiac rate remained controlled.  He would continue with Pravachol for history of hyperlipidemia.  Type 2 diabetes mellitus, maintained on glipizide 5 mg daily with full diabetic teaching.  Bouts of constipation resolved with laxative assistance.  He was maintained on a regular consistency diet.  The patient received weekly collaborative interdisciplinary team conferences to discuss estimated length of stay, family teaching, any barriers to his discharge.  The patient's supervision assist with moderate cues for direction and unfamiliar environments, ambulating extended  distances, needing assistance for his safety, monitoring for any loss of balance.  Sessions focused on core strengthening, posture and balance.  Noted improvement in his step length during ambulation.  He could gather his belongings for activities of daily living and homemaking.  Required minimal guard at times for his awareness.   He was able to communicate his full needs, identified any errors of written expression during familiar tasks such as writing a check or addressing an envelope.  Full family teaching was completed and plan discharge to home.  DISCHARGE MEDICATIONS: 1. Eliquis 5 mg p.o. b.i.d. 2. Aspirin 81 mg p.o. daily. 3. Glipizide 5 mg p.o. daily. 4. Imdur 15 mg p.o. daily. 5. Linzess 290 mcg p.o. daily at breakfast. 6. Pravachol 20 mg p.o. daily. 7. Tylenol as needed.  DIET:  Diabetic diet.  FOLLOWUP:  The patient would follow up with Dr. Alysia Penna at the [facility] as directed; Dr. Erlinda Hong, Neurology Services, 1 month call for appointment; Dr. Lavone Orn, medical management.  SPECIAL INSTRUCTIONS:  No driving.     Lauraine Rinne, P.A.   ______________________________ Charlett Blake, M.D.    DA/MEDQ  D:  11/09/2016  T:  11/10/2016  Job:  [id number]  cc:   Delanna Ahmadi, M.D. Rosalin Hawking, M.D.

## 2016-11-10 NOTE — Progress Notes (Signed)
[facility] PHYSICAL MEDICINE & REHABILITATION     PROGRESS NOTE    Subjective/Complaints: Occ cough, denies sore throat, no fever/chills  ROS: limited by cognition, but appears to deny CP, SOB, N/V/D.  Objective: Vital Signs: Blood pressure (!) 156/68, pulse 88, temperature 99.5 F (37.5 C), temperature source Oral, resp. rate 18, weight 77.4 kg (170 lb 9.6 oz), SpO2 96 %. No results found. No results for input(s): WBC, HGB, HCT, PLT in the last 72 hours. No results for input(s): NA, K, CL, GLUCOSE, BUN, CREATININE, CALCIUM in the last 72 hours.  Invalid input(s): CO CBG (last 3)   Recent Labs  11/09/16 1635 11/09/16 2035 11/10/16 0632  GLUCAP 129* 139* 104*    Wt Readings from Last 3 Encounters:  11/04/16 77.4 kg (170 lb 9.6 oz)  10/28/16 74.8 kg (164 lb 14.4 oz)  02/13/16 79.4 kg (175 lb)    Physical Exam:  Constitutional: He appears well-developedand well-nourished. NAD.  HENT: Normocephalicand atraumatic  Throat clear, no cervical adenopathy.  Eyes: EOMare normal. No discharge.  Cardiovascular: RRR. No JVDpresent. Respiratory: Effort normaland breath sounds normal. GI: Soft. Bowel sounds are normal.  Musculoskeletal: He exhibits no edema. Dupuytren's contracture RUE.  Skin warm and dry Neurological: He is alert and oriented x1.  Limited insight and awareness.  Delays in processing.  Follows simple commands inconsistently.    Motor 4+/5 RUE and 4/5 RLE with apraxia LUE and LLE 4+-5/5.  Psych: flat, disengaged   Assessment/Plan: 1. Right hemisensory and cognitive deficits secondary to left thalamic infarct Stable for D/C today F/u PCP in 3-4 weeks F/u PM&R 2 weeks See D/C summary See D/C instructions Neuropsych as outpt Bathing Bathing position   Position: Shower  Bathing parts Body parts bathed by patient: Right arm, Left arm, Chest, Abdomen, Front perineal area, Right upper leg, Left upper leg, Right lower leg, Left lower leg, Buttocks,  Back Body parts bathed by helper: Back  Bathing assist Assist Level: Supervision or verbal cues      Upper Body Dressing/Undressing Upper body dressing   What is the patient wearing?: Pull over shirt/dress     Pull over shirt/dress - Perfomed by patient: Thread/unthread right sleeve, Thread/unthread left sleeve, Put head through opening, Pull shirt over trunk          Upper body assist Assist Level: More than reasonable time   Set up : To obtain clothing/put away  Lower Body Dressing/Undressing Lower body dressing   What is the patient wearing?: Underwear, Pants, Socks, Shoes Underwear - Performed by patient: Thread/unthread right underwear leg, Thread/unthread left underwear leg, Pull underwear up/down   Pants- Performed by patient: Thread/unthread right pants leg, Thread/unthread left pants leg, Pull pants up/down, Fasten/unfasten pants   Non-skid slipper socks- Performed by patient: Don/doff right sock, Don/doff left sock   Socks - Performed by patient: Don/doff right sock, Don/doff left sock   Shoes - Performed by patient: Don/doff right shoe, Don/doff left shoe       TED Hose - Performed by patient: Don/doff left TED hose TED Hose - Performed by helper: Don/doff right TED hose  Lower body assist Assist for lower body dressing: Supervision or verbal cues      Toileting Toileting   Toileting steps completed by patient: Adjust clothing prior to toileting, Performs perineal hygiene, Adjust clothing after toileting Toileting steps completed by helper: Adjust clothing prior to toileting, Performs perineal hygiene, Adjust clothing after toileting Toileting Assistive Devices: Grab bar or rail  Toileting assist Assist  level: Supervision or verbal cues   Transfers Chair/bed transfer   Chair/bed transfer method: Ambulatory Chair/bed transfer assist level: Supervision or verbal cues Chair/bed transfer assistive device: Armrests     Locomotion Ambulation     Max  distance: 300 Assist level: Supervision or verbal cues   Wheelchair Wheelchair activity did not occur:  (pt ambulatory at community level) Type: Manual Max wheelchair distance: 114ft  Assist Level: Supervision or verbal cues  Cognition Comprehension Comprehension assist level: Follows basic conversation/direction with extra time/assistive device  Expression Expression assist level: Expresses basic 90% of the time/requires cueing < 10% of the time.  Social Interaction Social Interaction assist level: Interacts appropriately 90% of the time - Needs monitoring or encouragement for participation or interaction.  Problem Solving Problem solving assist level: Solves basic 90% of the time/requires cueing < 10% of the time  Memory Memory assist level: Recognizes or recalls 50 - 74% of the time/requires cueing 25 - 49% of the time   Medical Problem List and Plan: 1. Right hemisensory deficits/cognitive deficitssecondary to left thalamic infarct  CT/MRI reviewed, CT unremarkable, MRI showing thalamic lesion   D/C home if no temp spike this am and labwork reviewed  Appears to have good family support 2. DVT Prophylaxis/Anticoagulation: Eliquis. 3. Pain Management: Tylenol as needed 4. Mood:  Hold off on meds for now -Neuropsych as OP  for cognitive/behavior evaulation/ family education 5. Neuropsych: This patient isnot capable of making decisions on hisown behalf. 6. Skin/Wound Care: Routine skin checks 7. Fluids/Electrolytes/Nutrition: Routine I&Os   BMET    Component Value Date/Time   NA 138 11/01/2016 0851   K 4.1 11/01/2016 0851   CL 103 11/01/2016 0851   CO2 26 11/01/2016 0851   GLUCOSE 166 (H) 11/01/2016 0851   BUN 18 11/01/2016 0851   CREATININE 0.70 11/01/2016 0851   CALCIUM 9.0 11/01/2016 0851   GFRNONAA >60 11/01/2016 0851   GFRAA >60 11/01/2016 0851    8.CAD status post CABG. Continue aspirin,Continue Imdur 15 mg daily 9.PAF. Maintain on Eliquis. Rate  controlled 10.Hyperlipidemia. Pravachol 11. DM 2--fair control at present           -pt on glucotrol at home--increased to 5mg  CBG (last 3)      Recent Labs  11/09/16 1635 11/09/16 2035 11/10/16 0632  GLUCAP 129* 139* 104*  13.  Low grade temp tmax 99.5 will check CBC, [place], CXR prior to d/c  LOS (Days) 11 A FACE TO FACE EVALUATION WAS PERFORMED    Charlett Blake, MD 11/10/2016 7:25 AM

## 2016-11-10 NOTE — Plan of Care (Signed)
Problem: RH PAIN MANAGEMENT Goal: RH STG PAIN MANAGED AT OR BELOW PT'S PAIN GOAL <3  Outcome: Completed/Met Date Met: 11/10/16 No c/o pain   

## 2016-11-10 NOTE — Progress Notes (Signed)
Social Work  Discharge Note  The overall goal for the admission was met for:   Discharge location: [place] TO ASSIST.  Length of Stay: Yes-11 DAYS  Discharge activity level: Yes-SUPERVISION LEVEL  Home/community participation: Yes  Services provided included: MD, RD, PT, OT, SLP, RN, CM, TR, Pharmacy and SW  Financial Services: Private Insurance: BLUE MEDICARE  Follow-up services arranged: Home Health: [facility]-PT,OT,SP,RN and Patient/Family has no preference for HH/DME agencies  Comments (or additional information):dID [address] QUICKLY. ALTHOUGH PT DOES NOT FEEL HE IS DOING WELL. SON AND DAUGHTER HERE DAILY AND PARTICIPATED IN THERAPIES WITH HIM. AWARE WILL NEED 24 HR SUPERVISION AT DISCHARGE. TO SEE  [facility] AS AN OP INFORMATION GIVEN TO SON TO FOLLOW UP WITH. WIFE PRESENT AND HAD NO QUESTIONS BOTH FEEL COMFORTABLE WITH PT'S CARE AND DISCHARGE HOME TODAY.  Patient/Family verbalized understanding of follow-up arrangements: Yes  Individual responsible for coordination of the follow-up plan: ROBERT-SON AND DAUGHTER  Confirmed correct DME delivered: Elease Hashimoto 11/10/2016    Elease Hashimoto

## 2016-11-10 NOTE — Progress Notes (Signed)
Pt discharged home with family. Discharge instructions provided by Silvestre Mesi, PA.All questions answered. Pt and family verbalized understanding. Urine analysis results pending at discharge. Linna Hoff, PA to notify family of result, and order appropriate home antibiotic if needed. Pt escorted off unit in w/c with personal belonging by [place], [place].

## 2016-11-10 NOTE — Plan of Care (Signed)
Problem: RH BLADDER ELIMINATION Goal: RH STG MANAGE BLADDER WITH ASSISTANCE STG Manage Bladder With mod Assistance   Outcome: Completed/Met Date Met: 11/10/16 Pt was MOD I on discharge

## 2016-11-10 NOTE — Plan of Care (Signed)
Problem: RH SAFETY Goal: RH STG ADHERE TO SAFETY PRECAUTIONS W/ASSISTANCE/DEVICE STG Adhere to Safety Precautions With mod Assistance/Device.   Outcome: Completed/Met Date Met: 11/10/16 Pt was at supervision level at discharge

## 2016-11-11 ENCOUNTER — Telehealth: Payer: Self-pay

## 2016-11-11 LAB — URINE CULTURE: Culture: <10000 — AB

## 2016-11-11 NOTE — Telephone Encounter (Signed)
Spoke with patients son:  Transitional Care call  Patient name: Jonathan [name], Jonathan [name]   DOB: 1929/05/03 1. Are you/is patient experiencing any problems since coming home? no a. Are there any questions regarding any aspect of care? no 2. Are there any questions regarding medications administration/dosing? no a. Are meds being taken as prescribed? yes b. "Patient should review meds with caller to confirm" 3. Have there been any falls? no 4. Has Home Health been to the house and/or have they contacted you? yes a. If not, have you tried to contact them? no b. Can we help you contact them? no 5. Are bowels and bladder emptying properly? yes a. Are there any unexpected incontinence issues? no b. If applicable, is patient following bowel/bladder programs? yes 6. Any fevers, problems with breathing, unexpected pain? no 7. Are there any skin problems or new areas of breakdown? no 8. Has the patient/family member arranged specialty MD follow up (ie cardiology/neurology/renal/surgical/etc.)?  yes a. Can we help arrange? no 9. Does the patient need any other services or support that we can help arrange? no 10. Are caregivers following through as expected in assisting the patient? yes 11. Has the patient quit smoking, drinking alcohol, or using drugs as recommended? n/a  Appointment time 11-13-16/1130am, arrive time 1100am and who it is with here Dr. Alysia Penna  [address]

## 2016-11-12 DIAGNOSIS — Z951 Presence of aortocoronary bypass graft: Secondary | ICD-10-CM | POA: Diagnosis not present

## 2016-11-12 DIAGNOSIS — I480 Paroxysmal atrial fibrillation: Secondary | ICD-10-CM | POA: Diagnosis not present

## 2016-11-12 DIAGNOSIS — Z7982 Long term (current) use of aspirin: Secondary | ICD-10-CM | POA: Diagnosis not present

## 2016-11-12 DIAGNOSIS — E119 Type 2 diabetes mellitus without complications: Secondary | ICD-10-CM | POA: Diagnosis not present

## 2016-11-12 DIAGNOSIS — Z87891 Personal history of nicotine dependence: Secondary | ICD-10-CM | POA: Diagnosis not present

## 2016-11-12 DIAGNOSIS — H9193 Unspecified hearing loss, bilateral: Secondary | ICD-10-CM | POA: Diagnosis not present

## 2016-11-12 DIAGNOSIS — E785 Hyperlipidemia, unspecified: Secondary | ICD-10-CM | POA: Diagnosis not present

## 2016-11-12 DIAGNOSIS — I6931 Attention and concentration deficit following cerebral infarction: Secondary | ICD-10-CM | POA: Diagnosis not present

## 2016-11-12 DIAGNOSIS — I69320 Aphasia following cerebral infarction: Secondary | ICD-10-CM | POA: Diagnosis not present

## 2016-11-12 DIAGNOSIS — I2510 Atherosclerotic heart disease of native coronary artery without angina pectoris: Secondary | ICD-10-CM | POA: Diagnosis not present

## 2016-11-13 ENCOUNTER — Encounter: Payer: Self-pay | Admitting: Physical Medicine & Rehabilitation

## 2016-11-13 ENCOUNTER — Ambulatory Visit (HOSPITAL_BASED_OUTPATIENT_CLINIC_OR_DEPARTMENT_OTHER): Payer: Medicare Other | Admitting: Physical Medicine & Rehabilitation

## 2016-11-13 VITALS — BP 136/71 | HR 79 | Resp 14

## 2016-11-13 DIAGNOSIS — Z7984 Long term (current) use of oral hypoglycemic drugs: Secondary | ICD-10-CM | POA: Insufficient documentation

## 2016-11-13 DIAGNOSIS — F4323 Adjustment disorder with mixed anxiety and depressed mood: Secondary | ICD-10-CM | POA: Diagnosis not present

## 2016-11-13 DIAGNOSIS — Z79899 Other long term (current) drug therapy: Secondary | ICD-10-CM | POA: Insufficient documentation

## 2016-11-13 DIAGNOSIS — E119 Type 2 diabetes mellitus without complications: Secondary | ICD-10-CM | POA: Insufficient documentation

## 2016-11-13 DIAGNOSIS — E785 Hyperlipidemia, unspecified: Secondary | ICD-10-CM | POA: Insufficient documentation

## 2016-11-13 DIAGNOSIS — Z7901 Long term (current) use of anticoagulants: Secondary | ICD-10-CM | POA: Diagnosis not present

## 2016-11-13 DIAGNOSIS — R413 Other amnesia: Secondary | ICD-10-CM | POA: Diagnosis present

## 2016-11-13 DIAGNOSIS — I10 Essential (primary) hypertension: Secondary | ICD-10-CM | POA: Diagnosis not present

## 2016-11-13 DIAGNOSIS — Z8249 Family history of ischemic heart disease and other diseases of the circulatory system: Secondary | ICD-10-CM | POA: Insufficient documentation

## 2016-11-13 DIAGNOSIS — I69319 Unspecified symptoms and signs involving cognitive functions following cerebral infarction: Secondary | ICD-10-CM

## 2016-11-13 DIAGNOSIS — Z951 Presence of aortocoronary bypass graft: Secondary | ICD-10-CM | POA: Diagnosis not present

## 2016-11-13 DIAGNOSIS — I2510 Atherosclerotic heart disease of native coronary artery without angina pectoris: Secondary | ICD-10-CM | POA: Insufficient documentation

## 2016-11-13 NOTE — Progress Notes (Signed)
Subjective:    Patient ID: Jonathan [name], male    DOB: [birth year], 82 y.o.   MRN: [id number] 83 year old right-handed male, history of CAD with CABG, 2007; hypertension; PAF, on Xarelto in the past.  Presented on October 28, 2016, with altered mental status, right- sided weakness, slurred speech.  He lives with his 78 year old wife, independent prior to admission, still driving.  Son lives next door. MRI revealed acute 15 x 11-mm left thalamus infarct.  CT angiogram of the head and neck showed right posterior cerebral artery P1 occlusion. Atherosclerotic type changes. HPI Discharged 11/10/16 Eating well  No falls RN and SLP from [facility] yesterday PT this afternoon  No bleeding with BMs or urination Shaved without cuts Dressing independantly Still with Supervision for showers  RN checked blood sugar yesterday BPs have been normal 130-140/ 70s  PCP appt 9am next Monday 2/12   Pain Inventory Average Pain 0 Pain Right Now 0 My pain is no pain  In the last 24 hours, has pain interfered with the following? General activity 0 Relation with others 0 Enjoyment of life 0 What TIME of day is your pain at its worst? no pain Sleep (in general) Fair  Pain is worse with: no pain Pain improves with: no pain Relief from Meds: no pain  Mobility walk without assistance ability to climb steps?  yes do you drive?  no Do you have any goals in this area?  yes  Function retired Do you have any goals in this area?  yes  Neuro/Psych confusion  Prior Studies x-rays CT/MRI hospital follow up  Physicians involved in your care Neurologist Dr. Erlinda Hong hospital follow up   Family History  Problem Relation Age of Onset  . Hypertension Father    Social History   Social History  . Marital status: Married    Spouse name: N/A  . Number of children: 2  . Years of education: HS   Occupational History  . Retired    Social History Main Topics  . Smoking status: Former Research scientist (life sciences)  .  Smokeless tobacco: Never Used     Comment: Quit 1958  . Alcohol use No  . Drug use: No  . Sexual activity: Not Asked   Other Topics Concern  . None   Social History Narrative   Retired ,Scientist, clinical (histocompatibility and immunogenetics), married .   Past Surgical History:  Procedure Laterality Date  . bil carpal tunnel surgery    . CARDIAC CATHETERIZATION N/A 10/01/2015   Procedure: Left Heart Cath and Cors/Grafts Angiography;  Surgeon: Peter M Martinique, MD;  Location: [facility];  Service: Cardiovascular;  Laterality: N/A;  . COLONOSCOPY    . CORONARY ARTERY BYPASS GRAFT  2006    Coronary artery bypass grafting x 4 (left internal mammary  . DUPUYTREN CONTRACTURE RELEASE Left 04/05/2014   Procedure: EXCISION DUPUYTREN LEFT PALM, RING, SMALL, AND THUMB;  Surgeon: Cammie Sickle, MD;  Location: [facility];  Service: Orthopedics;  Laterality: Left;  . EYE SURGERY     both cataracts  . HEMORRHOID SURGERY    . LAMINECTOMY    . ROTATOR CUFF REPAIR    . TONSILLECTOMY    . TRANSURETHRAL RESECTION OF PROSTATE     Past Medical History:  Diagnosis Date  . Allergic rhinitis   . BPH (benign prostatic hypertrophy)   . Coronary atherosclerosis of native coronary artery   . Degeneration of lumbar or lumbosacral intervertebral disc   . Diabetes ([place])  no meds  . Dyslipidemia   . HOH (hard of hearing)   . Hyperlipidemia   . Hypertension   . Right leg weakness   . S/P CABG x 4 2006   LIMA-LAD, SVG-rPDA, sSVG-OM1-OM2  . Stroke ([place])   . Wears glasses    reading   BP 136/71   Pulse 79   Resp 14   SpO2 91%   Opioid Risk Score:   Fall Risk Score:  `1  Depression screen PHQ 2/9  No flowsheet data found.  Review of Systems  Constitutional: Negative.   HENT: Negative.   Eyes: Negative.   Respiratory: Negative.   Cardiovascular: Negative.   Gastrointestinal: Negative.   Endocrine: Negative.   Genitourinary: Negative.   Musculoskeletal: Positive for gait problem.  Skin:  Negative.   Allergic/Immunologic: Negative.   Hematological: Negative.   Psychiatric/Behavioral: Positive for confusion.  All other systems reviewed and are negative.      Objective:   Physical Exam  Constitutional: He appears well-developed and well-nourished.  HENT:  Head: Normocephalic and atraumatic.  Cardiovascular: Normal rate and regular rhythm.   Neurological: He is alert.  Skin: Skin is warm and dry.  Psychiatric: He has a normal mood and affect. His behavior is normal. Judgment and thought content normal.   strength is 5/5 bilateral deltoid, bicep, triceps, grip , hip flexor, knee extensor, ankle dorsiflexor and plantar flexor Gait without evidence of toe drag or knee instability. He does have mild loss of balance with turns. His base of support is widened.      Assessment & Plan:  1.  Left thalamic infarct with residual balance and cognitive deficits He is independent with his ADLs, still requires some supervision with showering. He is not ready for driving 2. 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 Given subcortical location of infarct would expect this to improve.  We discussed that his cognitive deficits are the major issue following his stroke. I expect him to recover physically in the next month or so. His cognitive recovery will take about 1 year.  Referral to neuropsychology,not only for  cognitive evaluation, but also trying to deal with patient's intermittent refusal of therapy, which according to family is not normal behavior for him.  I anticipate that he will need some outpatient speech therapy will review. Neuropsych evaluation at next visit.

## 2016-11-13 NOTE — Patient Instructions (Signed)
No driving  Referral made for neuropsychology evaluation

## 2016-11-16 DIAGNOSIS — E119 Type 2 diabetes mellitus without complications: Secondary | ICD-10-CM | POA: Diagnosis not present

## 2016-11-16 DIAGNOSIS — I10 Essential (primary) hypertension: Secondary | ICD-10-CM | POA: Diagnosis not present

## 2016-11-16 DIAGNOSIS — I6930 Unspecified sequelae of cerebral infarction: Secondary | ICD-10-CM | POA: Diagnosis not present

## 2016-11-16 DIAGNOSIS — I480 Paroxysmal atrial fibrillation: Secondary | ICD-10-CM | POA: Diagnosis not present

## 2016-11-17 ENCOUNTER — Telehealth: Payer: Self-pay

## 2016-11-17 NOTE — Telephone Encounter (Signed)
Needs order for PT verbal for balance issues 1wx x 3 weeks.  Verbal orders given

## 2016-11-26 ENCOUNTER — Encounter: Payer: Medicare Other | Attending: Psychology | Admitting: Psychology

## 2016-11-26 ENCOUNTER — Telehealth: Payer: Self-pay

## 2016-11-26 DIAGNOSIS — I69319 Unspecified symptoms and signs involving cognitive functions following cerebral infarction: Secondary | ICD-10-CM

## 2016-11-26 DIAGNOSIS — F4323 Adjustment disorder with mixed anxiety and depressed mood: Secondary | ICD-10-CM

## 2016-11-27 NOTE — Telephone Encounter (Signed)
Sonia Baller ST called stating due to the current state of mind of patient that [facility] is placing a hold on treatment for this patient until a neuropsych eval is completed

## 2016-12-01 ENCOUNTER — Encounter: Payer: Self-pay | Admitting: Psychology

## 2016-12-01 ENCOUNTER — Encounter (HOSPITAL_BASED_OUTPATIENT_CLINIC_OR_DEPARTMENT_OTHER): Payer: Medicare Other | Admitting: Psychology

## 2016-12-01 DIAGNOSIS — Z951 Presence of aortocoronary bypass graft: Secondary | ICD-10-CM | POA: Diagnosis not present

## 2016-12-01 DIAGNOSIS — F4323 Adjustment disorder with mixed anxiety and depressed mood: Secondary | ICD-10-CM

## 2016-12-01 DIAGNOSIS — I69319 Unspecified symptoms and signs involving cognitive functions following cerebral infarction: Secondary | ICD-10-CM | POA: Diagnosis not present

## 2016-12-01 DIAGNOSIS — Z7984 Long term (current) use of oral hypoglycemic drugs: Secondary | ICD-10-CM | POA: Diagnosis not present

## 2016-12-01 DIAGNOSIS — Z7901 Long term (current) use of anticoagulants: Secondary | ICD-10-CM | POA: Diagnosis not present

## 2016-12-01 DIAGNOSIS — Z79899 Other long term (current) drug therapy: Secondary | ICD-10-CM | POA: Diagnosis not present

## 2016-12-01 DIAGNOSIS — E785 Hyperlipidemia, unspecified: Secondary | ICD-10-CM | POA: Diagnosis not present

## 2016-12-01 DIAGNOSIS — E119 Type 2 diabetes mellitus without complications: Secondary | ICD-10-CM | POA: Diagnosis not present

## 2016-12-01 DIAGNOSIS — I10 Essential (primary) hypertension: Secondary | ICD-10-CM | POA: Diagnosis not present

## 2016-12-01 DIAGNOSIS — I2510 Atherosclerotic heart disease of native coronary artery without angina pectoris: Secondary | ICD-10-CM | POA: Diagnosis not present

## 2016-12-01 NOTE — Progress Notes (Signed)
Patient:  Jonathan [name]   DOB: [birth year]  MR Number: [id number]  Location: [facility] [address], [place] [id number] [address] Dept: [phone]  Start: 9 AM End: 9 AM  Provider/Observer:     Edgardo Roys PSYD  Chief Complaint:      Chief Complaint  Patient presents with  . Anxiety  . Stress    Reason For Service:     The patient was referred by Dr. Letta Pate for psychological/neuropsychological interventions. The patient vascular event in January 2018 where he presented with confusion. 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 The patient has had adjustment difficulties after his hospitalization and is reluctant to go through some of the rehabilitation efforts. He has continued to have some confusion which have played a role in his reluctance. There was a referral for psychological interventions to help him better understand how his potential underlying anxiety and frustration with what has been going on with him is negatively impacting his follow-through in some of the rehabilitation in speech therapy efforts. The patient had a stroke that essentially was described as a thalamic infarct on January 17. The patient has been frustrated about what is happening with him and describes worry and anxiety around his inability to improve and is not very cooperative with the therapist he has been working with.   Interventions Strategy:  Cognitive behavioral interventions and working on coping and adaptive strategies to help him better utilize therapeutic interventions post stroke.  Participation Level:   Active  Participation Quality:  Appropriate      Behavioral Observation:  Well Groomed, Alert, and Appropriate.   Current Psychosocial Factors: The patient Reports that he has had some ongoing frustration with his family  about his participation. He reports that he is better understanding the situation after her first visit and we continue to work on these issues today.  Content of Session:   Reviewed current symptoms and work on therapeutic interventions around helping the patient better understand what has been happening with him cognitively and working on issues around his motivation and hesitancy towards fully participate.  Current Status:   The patient acknowledged much more today about his anxiety and worry about what has been going on with him. He reports that he is concerned about what happened to his wife who has difficulties over on if he dies suddenly. This issue of sudden death and worry about what is going on with him and not knowing are causing a lot of stress.  Patient Progress:   Stable but improving  Target Goals:   Target goals include working on therapeutic interventions around improving and greater understanding.  Last Reviewed:   12/01/2016  Goals Addressed Today:    Today we worked on improving the patient's understanding about what has happened to him as well as working on them understanding why the efforts that he is doing with regard to his speech therapy and other therapeutic interventions are important.  Impression/Diagnosis:   The patient had a stroke/thalamic infarct on October 28, 2016. He went through the neuro rehabilitation program and is now being followed through our outpatient clinic. The patient has had difficulty coping with some of the changes that he has had and is continuing to have some anxiety and frustration about his recovery and has not been particularly cooperative with therapeutic interventions to this point. The family is concerned about  his lack of cooperation.  Diagnosis:   Cognitive deficit due to recent stroke  Adjustment disorder with mixed anxiety and depressed mood

## 2016-12-01 NOTE — Progress Notes (Signed)
Patient:   Jonathan [name]   DOB:   10-15-1928  MR Number:  [id number]  Location:  [facility] [address], [place] [id number] [place] [address] Dept: [phone]           Date of Service:   11/26/2016  Start Time:   10 AM End Time:   11 AM  Provider/Observer:  Edgardo Roys PSYD       Billing Code/Service: [phone]  Chief Complaint:     Chief Complaint  Patient presents with  . Memory Loss  . Other    cognitive deficits    Reason for Service:  The patient was referred by Dr. Letta Pate for psychological/neuropsychological interventions. The patient vascular event in January 2018 where he presented with confusion. 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 The patient has had adjustment difficulties after his hospitalization and is reluctant to go through some of the rehabilitation efforts. He has continued to have some confusion which have played a role in his reluctance. There was a referral for psychological interventions to help him better understand how his potential underlying anxiety and frustration with what has been going on with him is negatively impacting his follow-through in some of the rehabilitation in speech therapy efforts. The patient had a stroke that essentially was described as a thalamic infarct on January 17. The patient has been frustrated about what is happening with him and describes worry and anxiety around his inability to improve and is not very cooperative with the therapist he has been working with.  Current Status:  Anxious about what might happen to him medically going forward as well as shins about not really understanding why he is doing what he is doing.  Reliability of Information: Information is derived from review of medical records as well as clinical interview with the patient, his wife, and  their son.  Behavioral Observation: FURKAN GEREN  presents as a 83 y.o.-year-old Right Caucasian Male who appeared his stated age. his dress was Appropriate and he was Well Groomed and his manners were Appropriate to the situation.  his participation was indicative of Resistant behaviors.  There were not any physical disabilities noted.  he displayed an appropriate level of cooperation and motivation.     Interactions:    Active Appropriate and Resistant  Attention:   abnormal and attention span appeared shorter than expected for age  Memory:   within normal limits; recent and remote memory intact  Visuo-spatial:  within normal limits  Speech (Volume):  low  Speech:   normal; There was some hesitation and latency in his speech.  Thought Process:  Coherent  Though Content:  WNL;   Orientation:   person, place, time/date and situation  Judgment:   Fair  Planning:   Fair  Affect:    Anxious and A little frustrated  Mood:    Anxious  Insight:   Fair  Intelligence:   normal  Medical History:   Past Medical History:  Diagnosis Date  . Allergic rhinitis   . BPH (benign prostatic hypertrophy)   . Coronary atherosclerosis of native coronary artery   . Degeneration of lumbar or lumbosacral intervertebral disc   . Diabetes ([place])    no meds  . Dyslipidemia   . HOH (hard of hearing)   . Hyperlipidemia   . Hypertension   . Right leg weakness   .  S/P CABG x 4 2006   LIMA-LAD, SVG-rPDA, sSVG-OM1-OM2  . Stroke ([place])   . Wears glasses    reading        Outpatient Encounter Prescriptions as of 11/26/2016  Medication Sig  . apixaban (ELIQUIS) 5 MG TABS tablet Take 1 tablet (5 mg total) by mouth 2 (two) times daily.  Marland Kitchen aspirin EC 81 MG EC tablet Take 1 tablet (81 mg total) by mouth daily.  Marland Kitchen glipiZIDE (GLUCOTROL) 5 MG tablet Take 1 tablet (5 mg total) by mouth daily before breakfast.  . isosorbide mononitrate (IMDUR) 30 MG 24 hr tablet Take 0.5 tablets (15 mg total) by  mouth daily.  Marland Kitchen linaclotide (LINZESS) 290 MCG CAPS capsule Take 1 capsule (290 mcg total) by mouth daily before breakfast. (Patient not taking: Reported on 11/13/2016)  . pravastatin (PRAVACHOL) 20 MG tablet Take 1 tablet (20 mg total) by mouth daily at 6 PM.   No facility-administered encounter medications on file as of 11/26/2016.           Sexual History:   History  Sexual Activity  . Sexual activity: Not on file    Family Med/Psych History:  Family History  Problem Relation Age of Onset  . Hypertension Father     Risk of Suicide/Violence: virtually non-existent the patient denies any suicidal or homicidal ideation.  Impression/DX:  The patient had a stroke/thalamic infarct on October 28, 2016. He went through the neuro rehabilitation program and is now being followed through our outpatient clinic. The patient has had difficulty coping with some of the changes that he has had and is continuing to have some anxiety and frustration about his recovery and has not been particularly cooperative with therapeutic interventions to this point. The family is concerned about his lack of cooperation.  Disposition/Plan:  At this point, I think the most important issue is helping the patient understand why the various efforts are being made him better understand what happened to him to facilitate maximal recovery post stroke. Once we have a better handle on his underlying worries and fears and reason for this and hesitation we may look at formal neuropsychological testing if needed.  Diagnosis:    Cognitive deficit due to recent stroke  Adjustment disorder with mixed anxiety and depressed mood         Electronically Signed   _______________________ Ilean Skill, Psy.D.

## 2016-12-05 DIAGNOSIS — E119 Type 2 diabetes mellitus without complications: Secondary | ICD-10-CM | POA: Diagnosis not present

## 2016-12-08 ENCOUNTER — Encounter: Payer: Self-pay | Admitting: Psychology

## 2016-12-08 ENCOUNTER — Encounter (HOSPITAL_BASED_OUTPATIENT_CLINIC_OR_DEPARTMENT_OTHER): Payer: Medicare Other | Admitting: Psychology

## 2016-12-08 DIAGNOSIS — I2510 Atherosclerotic heart disease of native coronary artery without angina pectoris: Secondary | ICD-10-CM | POA: Diagnosis not present

## 2016-12-08 DIAGNOSIS — E785 Hyperlipidemia, unspecified: Secondary | ICD-10-CM | POA: Diagnosis not present

## 2016-12-08 DIAGNOSIS — F4323 Adjustment disorder with mixed anxiety and depressed mood: Secondary | ICD-10-CM | POA: Diagnosis not present

## 2016-12-08 DIAGNOSIS — I69319 Unspecified symptoms and signs involving cognitive functions following cerebral infarction: Secondary | ICD-10-CM | POA: Diagnosis not present

## 2016-12-08 DIAGNOSIS — E119 Type 2 diabetes mellitus without complications: Secondary | ICD-10-CM | POA: Diagnosis not present

## 2016-12-08 DIAGNOSIS — Z951 Presence of aortocoronary bypass graft: Secondary | ICD-10-CM | POA: Diagnosis not present

## 2016-12-08 DIAGNOSIS — Z79899 Other long term (current) drug therapy: Secondary | ICD-10-CM | POA: Diagnosis not present

## 2016-12-08 DIAGNOSIS — Z7901 Long term (current) use of anticoagulants: Secondary | ICD-10-CM | POA: Diagnosis not present

## 2016-12-08 DIAGNOSIS — I10 Essential (primary) hypertension: Secondary | ICD-10-CM | POA: Diagnosis not present

## 2016-12-08 DIAGNOSIS — Z7984 Long term (current) use of oral hypoglycemic drugs: Secondary | ICD-10-CM | POA: Diagnosis not present

## 2016-12-08 NOTE — Progress Notes (Signed)
Today, the patient was administered the Wechsler Adult Intelligence Scale-III. I was also able to administer RBANDS.  The patient was fully engaged for the testing today and it does appear to be a valid assessment. However, there were clearly some areas that he had some significant deficits. We had planned on doing the Weschler memory scale as well but the patient's memory was so impaired that we ended up using the RBANDS.  This required 2 hours of testing with one hour of scoring part of that third hour of scoring was also questions from the patient and his son.

## 2016-12-11 ENCOUNTER — Encounter: Payer: Self-pay | Admitting: Physical Medicine & Rehabilitation

## 2016-12-11 ENCOUNTER — Encounter: Payer: Medicare Other | Attending: Psychology

## 2016-12-11 ENCOUNTER — Ambulatory Visit (HOSPITAL_BASED_OUTPATIENT_CLINIC_OR_DEPARTMENT_OTHER): Payer: Medicare Other | Admitting: Physical Medicine & Rehabilitation

## 2016-12-11 VITALS — BP 145/83 | HR 82

## 2016-12-11 DIAGNOSIS — F4323 Adjustment disorder with mixed anxiety and depressed mood: Secondary | ICD-10-CM | POA: Insufficient documentation

## 2016-12-11 DIAGNOSIS — I69319 Unspecified symptoms and signs involving cognitive functions following cerebral infarction: Secondary | ICD-10-CM | POA: Insufficient documentation

## 2016-12-11 DIAGNOSIS — I10 Essential (primary) hypertension: Secondary | ICD-10-CM | POA: Diagnosis not present

## 2016-12-11 DIAGNOSIS — Z7984 Long term (current) use of oral hypoglycemic drugs: Secondary | ICD-10-CM | POA: Insufficient documentation

## 2016-12-11 DIAGNOSIS — Z7901 Long term (current) use of anticoagulants: Secondary | ICD-10-CM | POA: Insufficient documentation

## 2016-12-11 DIAGNOSIS — Z951 Presence of aortocoronary bypass graft: Secondary | ICD-10-CM | POA: Diagnosis not present

## 2016-12-11 DIAGNOSIS — I2510 Atherosclerotic heart disease of native coronary artery without angina pectoris: Secondary | ICD-10-CM | POA: Diagnosis not present

## 2016-12-11 DIAGNOSIS — E119 Type 2 diabetes mellitus without complications: Secondary | ICD-10-CM | POA: Insufficient documentation

## 2016-12-11 DIAGNOSIS — R413 Other amnesia: Secondary | ICD-10-CM | POA: Diagnosis present

## 2016-12-11 DIAGNOSIS — Z8249 Family history of ischemic heart disease and other diseases of the circulatory system: Secondary | ICD-10-CM | POA: Diagnosis not present

## 2016-12-11 DIAGNOSIS — Z79899 Other long term (current) drug therapy: Secondary | ICD-10-CM | POA: Diagnosis not present

## 2016-12-11 DIAGNOSIS — E785 Hyperlipidemia, unspecified: Secondary | ICD-10-CM | POA: Insufficient documentation

## 2016-12-11 NOTE — Patient Instructions (Signed)
If attention and concentration are the main deficits on the neuropsychologic testing, Ritalin may be an option. If there are more problems with mood, an antidepressants  may be a option

## 2016-12-11 NOTE — Progress Notes (Signed)
Subjective:    Patient ID: Jonathan [name], male    DOB: [birth year], 83 y.o.   MRN: [id number] Left thalamic infarct 10/28/2016 , completed inpatient rehabilitation Jonathan [name] HPI Patient accompanied by his son and his wife. Patient denies any new complaints today.  The patient has had some reluctance to participate in speech therapy. Sometimes does not feel what they are working on his beneficial, sometimes he doesn't even understand what they are working on.  According to his son. The patient has "good days "and bad days" Functionally, is independent with all his self-care and mobility. Still is not driving Reviewed Neuropsych note Still has memory issues, also prior neuropsych notes indicate adjustment disorder Has f/u with Dr Rexene Alberts, Last visit was approximately one year ago, mainly for symptoms of dizziness  Son also has questions about the combination of both Eliquis and baby aspirin Pain Inventory Average Pain 0 Pain Right Now 0 My pain is .  In the last 24 hours, has pain interfered with the following? General activity 0 Relation with others 0 Enjoyment of life 0 What TIME of day is your pain at its worst? . Sleep (in general) Good  Pain is worse with: . Pain improves with: injections and . Relief from Meds: .  Mobility walk without assistance ability to climb steps?  yes  Function retired  Neuro/Psych confusion depression anxiety  Prior Studies Any changes since last visit?  no  Physicians involved in your care Any changes since last visit?  no   Family History  Problem Relation Age of Onset  . Hypertension Father    Social History   Social History  . Marital status: Married    Spouse name: N/A  . Number of children: 2  . Years of education: HS   Occupational History  . Retired    Social History Main Topics  . Smoking status: Former Research scientist (life sciences)  . Smokeless tobacco: Never Used     Comment: Quit 1958  . Alcohol use No  . Drug use: No  .  Sexual activity: Not on file   Other Topics Concern  . Not on file   Social History Narrative   Retired ,Scientist, clinical (histocompatibility and immunogenetics), married .   Past Surgical History:  Procedure Laterality Date  . bil carpal tunnel surgery    . CARDIAC CATHETERIZATION N/A 10/01/2015   Procedure: Left Heart Cath and Cors/Grafts Angiography;  Surgeon: Peter M Martinique, MD;  Location: [facility];  Service: Cardiovascular;  Laterality: N/A;  . COLONOSCOPY    . CORONARY ARTERY BYPASS GRAFT  2006    Coronary artery bypass grafting x 4 (left internal mammary  . DUPUYTREN CONTRACTURE RELEASE Left 04/05/2014   Procedure: EXCISION DUPUYTREN LEFT PALM, RING, SMALL, AND THUMB;  Surgeon: Cammie Sickle, MD;  Location: [facility];  Service: Orthopedics;  Laterality: Left;  . EYE SURGERY     both cataracts  . HEMORRHOID SURGERY    . LAMINECTOMY    . ROTATOR CUFF REPAIR    . TONSILLECTOMY    . TRANSURETHRAL RESECTION OF PROSTATE     Past Medical History:  Diagnosis Date  . Allergic rhinitis   . BPH (benign prostatic hypertrophy)   . Coronary atherosclerosis of native coronary artery   . Degeneration of lumbar or lumbosacral intervertebral disc   . Diabetes ([place])    no meds  . Dyslipidemia   . HOH (hard of hearing)   . Hyperlipidemia   . Hypertension   .  Right leg weakness   . S/P CABG x 4 2006   LIMA-LAD, SVG-rPDA, sSVG-OM1-OM2  . Stroke ([place])   . Wears glasses    reading   There were no vitals taken for this visit.  Opioid Risk Score:   Fall Risk Score:  `1  Depression screen PHQ 2/9  Depression screen PHQ 2/9 11/13/2016  Decreased Interest 3  Down, Depressed, Hopeless 2  PHQ - 2 Score 5  Altered sleeping 1  Tired, decreased energy 0  Change in appetite 2  Feeling bad or failure about yourself  0  Trouble concentrating 3  Moving slowly or fidgety/restless 2  Suicidal thoughts 0  PHQ-9 Score 13  Difficult doing work/chores Somewhat difficult    Review of Systems   Constitutional: Negative.   HENT: Negative.   Eyes: Negative.   Respiratory: Negative.   Cardiovascular: Negative.   Gastrointestinal: Negative.   Endocrine: Negative.   Genitourinary: Negative.   Musculoskeletal: Negative.   Skin: Negative.   Allergic/Immunologic: Negative.   Neurological: Negative.   Hematological: Negative.   Psychiatric/Behavioral: Negative.   All other systems reviewed and are negative.      Objective:   Physical Exam  Constitutional: He is oriented to person, place, and time. He appears well-developed and well-nourished.  HENT:  Head: Normocephalic and atraumatic.  Eyes: Conjunctivae and EOM are normal. Pupils are equal, round, and reactive to light.  Neurological: He is alert and oriented to person, place, and time. No sensory deficit.  Has difficulty with toe walking but standard gait is normal, also has some difficulty with tandem gait.  Romberg is positive. He is able to maintain his balance with his eyes open. However  Motor strength is 5/5 bilateral deltoid, biceps, triceps, grip, hip flexor, knee extensor, ankle dorsiflexor and plantar flexor  Psychiatric: He has a normal mood and affect.  Nursing note and vitals reviewed.  Patient answers questions but rarely initiates conversation. Speech without evidence of dysarthria. No apparent word finding deficits, although, minimal verbal output       Assessment & Plan:  1. Left thalamic infarct, no residual motor deficit. History of A. fib, question of current CVA was small vessel disease versus cardioembolic.  Functionally independent. Has cognitive deficits related to CVA. Initially had some aphasia, but this seems to be clearing. Motivation also may be an issue, patient does not see the benefit of cognitive remediation. I recommended follow-up with neuropsychology to go over testing batteries. I have size that he needs to identify what his strong points and weak points are and focus on those  accordingly to maximize his cognitive recovery. Recommend no driving at this time.  In terms of his secondary stroke prevention medications, would tend you both, baby aspirin, and Eliquis  recommend discussion with neurology later this month   physical medicine rehabilitation in one month  Over half of the 25 min visit was spent counseling and coordinating care.

## 2016-12-21 ENCOUNTER — Encounter (HOSPITAL_BASED_OUTPATIENT_CLINIC_OR_DEPARTMENT_OTHER): Payer: Medicare Other | Admitting: Psychology

## 2016-12-21 ENCOUNTER — Encounter: Payer: Self-pay | Admitting: Psychology

## 2016-12-21 DIAGNOSIS — I69319 Unspecified symptoms and signs involving cognitive functions following cerebral infarction: Secondary | ICD-10-CM | POA: Diagnosis not present

## 2016-12-21 DIAGNOSIS — F4323 Adjustment disorder with mixed anxiety and depressed mood: Secondary | ICD-10-CM | POA: Diagnosis not present

## 2016-12-21 NOTE — Progress Notes (Signed)
Patient:  Jonathan [name]   DOB: April 25, 1929  MR Number: [id number]  Location: [facility] [address], [place] [address] Dept: [phone]  Start: 1 PM End: 3 PM  Provider/Observer:     Edgardo Roys PSYD  Chief Complaint:      Chief Complaint  Patient presents with  . Memory Loss  . Headache  . Agitation    Reason For Service:     The patient was referred by Dr. Letta Pate for psychological/neuropsychological interventions. The patient vascular event in January 2018 where he presented with confusion. 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 The patient has had adjustment difficulties after his hospitalization and is reluctant to go through some of the rehabilitation efforts. He has continued to have some confusion which have played a role in his reluctance. There was a referral for psychological interventions to help him better understand how his potential underlying anxiety and frustration with what has been going on with him is negatively impacting his follow-through in some of the rehabilitation in speech therapy efforts. The patient had a stroke that essentially was described as a thalamic infarct on January 17. The patient has been frustrated about what is happening with him and describes worry and anxiety around his inability to improve and is not very cooperative with the therapist he has been working with.  Testing Administered:  RBANS Form A and WAIS-III  Participation Level:   Active  Participation Quality:  Appropriate      Behavioral Observation:  Well Groomed, Alert, and Blunt.   Test Results:    DSM-IV Criteria for Dementia A. Development of multiple cognitive deficits: Marland Kitchen Memoryimpairment, plus . One or more Cognitive Disturbances: - Aphasia: disorder of language - Apraxia: impaired motor  activities - Agnosia: inability to recognize or identify objects - Dysexecutive: defect defective initiation, planning, organization/abstraction B. Cognitive deficits cause significant impairment in social/occupational functioning and are a decline from previous level of functioning  Mild Cognitive Impairment . Patients who are memory impaired but are otherwise functioning well and do not meet clinical criteria for dementia are classified as having MCI . Symptoms include - Memory complaint, preferably with corroboration - Objective memory impairment - Normal general cognitive function - Intact activities of daily living - Not demented . Patients with MCI should be recognized and monitored for cognitive and functional decline due to their increased risk for subsequent dementia   Qualitative Description of RBANS Index Scores      Classification Descriptors for Subtest Scaled Scores Above 16 very superior       Total Index Score:  71  The patient's performance falls in the borderline range of functioning with regard to his composite score of 71. However, there was tremendous variability within subtest performance and therefore there were clearly areas where he was functioning quite well relative to normative comparison group while other areas he was doing very poorly. Visual-spatial performance and initial auditory encoding were both either in the average range or superior range of functioning however, the patient had significant difficulties with immediate memory and delayed memory as well as verbal fluency subtest.  Total Index Score . Composite of all indexes within the battery. Kermit Balo indicator of general cognitive functioning . Low scores strongly suggest general cognitive impairment even when some individual subtest scores may be within normal limits. . Individuals with low scores on this  measure exhibit problems with attention memory language and constructional  skills attention, memory, language, skills. . Scores in low average to borderline range indicate either general lowering of skills across domains or variability in cognitive functioning with some lower and higher scores.     Immediate Memory Index:  44  The patient's performance on his immediate memory index was in the extremely low range. The patient displayed significant impairments with regard to learning a list of common words. The patient also had great difficulty with regard to short-term storage of information for story learning memory.     Immediate Memory Index . Measure of initial encoding and learning of complex and simple verbal information . Low scores indicate difficulties with verbal learning . Low average or borderline scores on this index can represent general low average verbal memory functioning or may occur when there is variability in the subtests that comprise the index. . Related to ability to self-medicate, manage finances, remember appointments, cook, drive . Impacts all other areas       Visuospatial / Constructional Index:  126  The patient displayed significant skill with regard to his visual-spatial and constructional abilities. While his was able to effectively he did exceptionally well on the line orientation test. This really looks at his visual spatial abilities patient did quite well. This is likely indicative of pre-existing skill in these areas but also the fact that none of these areas of abilities of been affected by stroke.   Visuospatial / Constructional Index . Measures basic visuospatial perception and ability to copy a design from a model. . Low scores indicate difficulties with processing and using visuospatial information. And may also occur in examinees with severe visual impairments or attention disorders such as hemineglect. . Low average or borderline scores can represent general low average visuospatial functioning or may occur when  there is variability in the subtests that comprise the index. Impacts visuospatial processing . Impacts driving, food preparation, use of tools       Attention Index:  91  The patient's performance on the attentional index falls at the lower end of the average range. The patient did exceptionally well with regard to his auditory encoding abilities but did have some difficulties with his overall speed of mental operations and focus execute abilities is significant split suggest that the patient's overall speed of mental operations and processing or problematic for him but that he continues to have a exceptional ability with regard to his auditory encoding, which is a prerequisite for memory and learning. Therefore his difficulties in this area do with speed of information processing with exceptional abilities for auditory encoding.    Attention Index . Measure of simple auditory registration and visual scanning and processing speed. . Low scores indicate difficulties with basic attention processes and speed of information processing. . Low average or borderline scores can represent general low average attention and processing speed or may occur when there is variability in the subtests that comprise the index. . Affects all ADLs      Language Indes:  76  The patient's performance on language subtest falls in the borderline range of functioning. This is a moderate level of impairment. The patient had moderate deficits with regard to naming particular objects but had significant deficits with regard to verbal fluency abilities. There were no indications of receptive language deficits but his limitations has to do with verbal fluency and not with specific expressive language deficits.  Language Index . Measure of expressive language functioning. Marland Kitchen  Low scores indicate difficulties with fluent use of language. . Primarily indicative of expressive language functioning, but individuals with  receptive language impairments will also have low scores on this measure. Marland Kitchen Speech Comprehension (Receptive Speech) - Ability to respond to questions - Ability to react appropriately to comments - Ability to respond to instructions for simple tests . Expressive speech - Fluency - Articulation - Prosody - Naming - Repetition    Delayed Memory Index:  48  The patient's performance on this measure falls in the significantly were severely impaired range. The patient had severe deficits with regard to recall information after period of delay. However, his performance was better relative to normative expectations with regard to delayed memory versus immediate memory. This pattern suggests that if information is transferred from encoding function to short-term memory that it is available for recall after period of delay. This clearly suggest that the primary deficit has to do with transferring information from encoding storage to short-term memory stores. If information is initially learned it is generally available for recall that a later time.  Delayed Memory Index . Measure of delayed recall and recognition for verbal and visual information. . Low scores indicate difficulties with recognition and retrieval of information from long-term memory stores. . Impacts all areas.   GENERAL INTELLECTUAL FUNCTIONING:  The patient's performance on the Weschler Adult Intellectual Scale-III, classifies his current intellectual abilities to be in the low average Range, with a Full Scale IQ score of 84, a Verbal IQ score of 79, and a Performance IQ score of 92.  Overall, his performance places his at the 14th percentile with regard to his age-related peer group.  Analysis on Index Scores produced a Verbal Comprehension Index Score of 74, a Perceptual Organization Index Score of 93, a Working Memory Index Score of 94, and a Processing Speed Index Score of 79.   Overall, WAIS-III performance suggest that the  patient's VIQ vs. PIQ were indicative of some significant differences in variability in subtest performance. The patient showed some considerable variability between his verbal and performance global areas of functioning with his visual-spatial abilities to be quite good. The patient showed strength with regard to his visual spatial and perceptual organizational abilities as well as his auditory/short-term encoding abilities. The patient had some significant difficulties with verbal comprehension and verbal reasoning abilities as well as his overall speed of mental operations.     Below are the Age-Adjusted scores for each of the individual WAIS-III subtests.  Vocabulary:    5 Similarities:    4 Arithmetic:    8 Digit Span:    11 Information:    7 Comprehension:   5 Letter Number Sequencing:  9  Picture Completion:   9 Digit Symbol:    8 Block Design:    8 Matrix Reasoning:   10 Picture Arrangement:   10 Symbol Search:   4  WAIS-III analysis reveals a VIQ score of 79, which places his at the eighth percentile.  Significant scatter was noted in subtest performance.  The patient displayed no significant strength relative to his age matched your group.  he performed in the average range on measures of auditory encoding with straight encoding being an exceptional strength and if there were any multiprocessing demands his performance deteriorated rapidly.  The patient also performed in the average range regard to his mathematical abilities attention was expected. The patient displayed moderately impaired performance on measures of his vocabulary knowledge, verbal reasoning abilities, general fund of information, and social  judgment comprehension abilities.   Clearly, the patient's ability to pull out general information that he should've learned in life as well as his social judgment, comprehension and verbal reasoning abilities are all impaired and likely indicative of significant loss of function  from pre-existing abilities.    WAIS-III analysis reveals a PIQ score of 92, which places him at the 30th percentile. Significant scatter was seen on these measures.   Average performance was noted on his ability to develop and understand visual gestalts, visual searching and sequencing, visual analysis and organization, visual reasoning and visual sequencing.  THe patient had significant impairments on measures of visual scanning speed and general speed of mental operations.    Overall, WAIS-III performance was in the Low Average range.  This pattern of strengths and weaknesses is globally consistent with the impact of his recent stroke.   The patients performance on various subtests suggest that there has been a significant loss of function in the verbal areas of vocabulary, verbal reasoning, social judgment and comprehension and speed of mental operations.  Other areas appear to remain intact.  These include mathematical abilities, auditory encoding, visual spatial abilities and visual reasoning.      Summary of Results:   Results of the current neuropsychological evaluation are consistent with significant memory deficits, reduction and verbal fluency and efficiency, reduction overall speed of mental operations, a loss of basic vocabulary, impairments with regard to verbal reasoning and problem-solving, a loss and social judgment comprehension abilities. However, the patient shows continued strength with regard to his visual-spatial and perceptual organizational abilities, visual reasoning and problem-solving, and visual scanning abilities. The memory deficits are directly related to the transfer of information from an effective encoding/inital storage to short term memory stores.  The limited information that is getting into short term memory is being stored in a way that allows for later retrieval.  These are consistent with the effects of the recent stroke. They're not consistent with other forms of  dementia that would be degenerative or progressive in nature and unless there are further strokes we are likely not to see any significant loss of functioning outside of the effects of aging this pattern of strengths and weaknesses is clearly consistent with a focal lesioning reduced by cerebrovascular accident/stroke.  Recommendations:   Given the timeline and the acute nature of his recent stroke, I would highly recommend the patient actively participate in any therapeutic efforts such as speech therapy, which is also helping him in other cognitive areas. The patient is reported to not be physically active at home and this will be very important for him to begin to increase his physical activity. The patient is not delusional and has memory capacity to avoid danger if left alone left alone. His judgement appears intact but his motivation is severely impaired.  His deficits have a lot to do with impairments in expressive language.  Mostly the impairments in his internal self talk that he has utilized in the past for motivation organizing his thinking. This confused internal self language/talk is likely playing a role in lack of motivation and initiative. I would highly recommend assist from family members to help him engaged in his possible.  Diagnosis:    Axis I: Cognitive deficit due to recent stroke  Adjustment disorder with mixed anxiety and depressed mood   Ilean Skill, Psy.D. Neuropsychologist

## 2016-12-29 DIAGNOSIS — I69319 Unspecified symptoms and signs involving cognitive functions following cerebral infarction: Secondary | ICD-10-CM | POA: Diagnosis not present

## 2016-12-29 DIAGNOSIS — I480 Paroxysmal atrial fibrillation: Secondary | ICD-10-CM | POA: Diagnosis not present

## 2017-01-04 ENCOUNTER — Ambulatory Visit (INDEPENDENT_AMBULATORY_CARE_PROVIDER_SITE_OTHER): Payer: Medicare Other | Admitting: Neurology

## 2017-01-04 ENCOUNTER — Telehealth: Payer: Self-pay | Admitting: Neurology

## 2017-01-04 ENCOUNTER — Encounter: Payer: Self-pay | Admitting: Neurology

## 2017-01-04 VITALS — BP 142/76 | HR 80 | Resp 16 | Ht 70.0 in | Wt 170.0 lb

## 2017-01-04 DIAGNOSIS — R0683 Snoring: Secondary | ICD-10-CM | POA: Diagnosis not present

## 2017-01-04 DIAGNOSIS — R351 Nocturia: Secondary | ICD-10-CM

## 2017-01-04 DIAGNOSIS — R413 Other amnesia: Secondary | ICD-10-CM | POA: Diagnosis not present

## 2017-01-04 DIAGNOSIS — Z8673 Personal history of transient ischemic attack (TIA), and cerebral infarction without residual deficits: Secondary | ICD-10-CM | POA: Diagnosis not present

## 2017-01-04 DIAGNOSIS — R42 Dizziness and giddiness: Secondary | ICD-10-CM

## 2017-01-04 NOTE — Patient Instructions (Addendum)
Continue exercising regularly and take your medications as directed. As discussed, secondary prevention is key after a stroke. This means: taking care of blood sugar values or diabetes management, good blood pressure (hypertension) control and optimizing cholesterol management, exercising daily or regularly within your own mobility limitations of course, and overall cardiovascular risk factor reduction, which includes screening for and treatment of obstructive sleep apnea (OSA) and weight management.   Based on your symptoms and your exam I believe you are at risk for obstructive sleep apnea or OSA, and I think we should proceed with a sleep study to determine whether you do or do not have OSA and how severe it is. If you have more than mild OSA, I want you to consider treatment with CPAP. Please remember, the risks and ramifications of moderate to severe obstructive sleep apnea or OSA are: Cardiovascular disease, including congestive heart failure, stroke, difficult to control hypertension, arrhythmias, and even type 2 diabetes has been linked to untreated OSA. Sleep apnea causes disruption of sleep and sleep deprivation in most cases, which, in turn, can cause recurrent headaches, problems with memory, mood, concentration, focus, and vigilance. Most people with untreated sleep apnea report excessive daytime sleepiness, which can affect their ability to drive. Please do not drive if you feel sleepy.   I will likely see you back after your sleep study to go over the test results and where to go from there. We will call you after your sleep study to advise about the results (most likely, you will hear from Beverlee Nims, my nurse) and to set up an appointment at the time, as necessary.    Our sleep lab administrative assistant, Arrie Aran will meet with you or call you to schedule your sleep study. If you don't hear back from her by next week please feel free to call her at [phone]. This is her direct line and please  leave a message with your phone number to call back if you get the voicemail box. She will call back as soon as possible.   Please drink more water, and try to exercise daily.  We will monitor your memory scores.   I will ask Dr. Erlinda Hong about the aspirin.  Please ask your primary care about a referral to cardiology.

## 2017-01-04 NOTE — Progress Notes (Signed)
Subjective:    Patient ID: Jonathan [name] is a 83 y.o. male.  HPI     Interim history:   Jonathan [name] is an 83 year old right-handed gentleman with an underlying complex medical history of BPH, right leg weakness, lumbar degenerative disc disease, history of L3-for herniated disc, coronary artery disease status post four-vessel CABG in 2007, hypertension, paroxysmal A. fib, previously on Xarelto, now on Eliquis, type 2 diabetes with diabetic retinopathy, hyperlipidemia, who presents for follow-up consultation, with a recent history of stroke. He is accompanied by his son today. I first met him on 02/13/2016 at the request of his primary care physician, at which time he reported feeling off balance and dizzy. I ordered an EEG and brain MRI. He had EEG on 03/03/2016: Impression: This is a normal EEG recording in the waking and drowsy state. No evidence of ictal or interictal discharges are seen.  We called him with his test results.   He had a brain MRI without contrast on 02/21/2016 which showed: IMPRESSION:  This MRI of the brain without contrast shows the following: 1.    T2/FLAIR hyperintense foci consistent with moderate chronic microvascular ischemic changes related to aging. 2.    Mild age-related generalized cortical atrophy 3.    There are no acute findings.  We called him with his test results at the time.  Today, 01/04/2017 (all dictated new, as well as above notes, some dictation done in note pad or Word, outside of chart, may appear as copied):   He reports doing reasonably well. He wakes up sometimes not feeling good but is not able to specify fairly well. He has mental fogginess. He has had more cognitive decline since the stroke per her son. He is not very motivated to do his exercises and also quit home health speech therapy. He has been seen by neuropsychologist through the rehabilitation clinic. Of note, he had a left thalamic stroke. He presented on 10/28/2016 with altered  mental status, slurring of speech, and right-sided weakness. He was admitted to the hospital, MRI brain showed left thalamic infarct. 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 Echocardiogram showed EF of 60 and diastolic dysfunction. EEG was suggestive of mild encephalopathy but negative for seizure. He was placed on low dose aspirin and started on Xarelto, then changed to Eliquis.  He was transferred to inpatient rehabilitation where he stayed from 10/30/2016 through 11/10/2016.  He reports no pain, weakness is essentially resolved, no numbness is reported. He has some abnormal sensations in his head and noises in his head, no pulsating tinnitus is reported.  He does not sleep very well. He has sleep interruption and has nocturia once per night on average, does not always wake up rested and feels often confused and foggy headed first thing in the morning, improves as the day progresses. He has not been driving. He has not had any alcohol since his stroke. He quit smoking in the 70s. He drinks 1 or 2 servings of caffeine per day. He does not always drink enough water. Recently in the past couple of weeks he has started walking, about a mile per day.  The patient's allergies, current medications, family history, past medical history, past social history, past surgical history and problem list were reviewed and updated as appropriate.   Previously (copied from previous notes for reference):   02/13/2016: He reports feeling off balance for some months, and he feels weaker in his L leg. He has had some memory complaints.  Overall, he reports that since his hospitalization in December 2016 he does not feel right and not the same. He used to be very active and very strong and now he feels easily exhausted, weaker, off balance, unsteady on his feet at times, mentally less sharp. He brought in a lot of written notes and records, hand written notes, had multiple questions today and blood  pressure and pulse log. All of the questions were reviewed and his notes were reviewed and I reviewed his handwritten blood pressure and pulse log as well. He has been working with his cardiologist with medication changes. Coreg is being phased out. Some of his symptoms were attributed to medication side effects. He has complaints of dizziness. Sometimes he feels that the room is spinning. I reviewed your office note from 09/17/2015, which you kindly included. Blood work from 09/17/2015 was also reviewed, hemoglobin A1c 7.7, BMP unremarkable with the exception of glucose at 1:30, lipid panel showed total cholesterol of 204, triglycerides 167, LDL 139. Of note, he was hospitalized from 09/28/2015 through 10/04/2015 secondary to fever, shortness of breath and palpitation. He was felt to have sepsis secondary to febrile illness from a viral infection. I reviewed records from the hospitalization including test results and discharge summary. He was treated with IV antibiotics, initially vancomycin and Zosyn and then changed to ceftriaxone and azithromycin.  He  had a TTE on 09/28/2015 which showed systolic function was vigorous, estimated EF was 65-70%. He had He had an abnormal stress test on 09/29/15. He had C Doppler study on 10/29/15 with <40% stenosis on R ICA and 40-59% stenosis on L ICA. He had a left heart cath on 10/01/2015 which showed severe three-vessel obstructive coronary artery disease, patent lima to LAD, patent SVG to PDA, occluded sequential SVG to the second and third OM's. Mild LV Dysfunction. Cardiology was consulted. Medical therapy was recommended. He had a chest x-ray and CT chest.  He had a head CT without contrast on 08/08/2015 which I reviewed:IMPRESSION: 1. No acute intracranial abnormalities. 2. 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   He denies any significant snoring. He does not always wake up rested. 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 He quit smoking in 1958 and quit alcohol altogether in 2015 and was an occasional beer drinker before then. He has never used illicit drugs and drinks about 3 cups of coffee per day, not always enough water.  It bothers him that he does not have full recollection from all the events that happened during his hospitalization in December 2016. He was sedated for his left heart cath and has amnesia for quite some time even after the left heart cath.   His Past Medical History Is Significant For: Past Medical History:  Diagnosis Date  . Allergic rhinitis   . BPH (benign prostatic hypertrophy)   . Coronary atherosclerosis of native coronary artery   . Degeneration of lumbar or lumbosacral intervertebral disc   . Diabetes ([place])    no meds  . Dyslipidemia   . HOH (hard of hearing)   . Hyperlipidemia   . Hypertension   . Right leg weakness   . S/P CABG x 4 2006   LIMA-LAD, SVG-rPDA, sSVG-OM1-OM2  . Stroke ([place])   . Wears glasses    reading    His Past Surgical History Is Significant For: Past Surgical History:  Procedure Laterality Date  . bil carpal tunnel surgery    .  CARDIAC CATHETERIZATION N/A 10/01/2015   Procedure: Left Heart Cath and Cors/Grafts Angiography;  Surgeon: Peter M Martinique, MD;  Location: [facility];  Service: Cardiovascular;  Laterality: N/A;  . COLONOSCOPY    . CORONARY ARTERY BYPASS GRAFT  2006    Coronary artery bypass grafting x 4 (left internal mammary  . DUPUYTREN CONTRACTURE RELEASE Left 04/05/2014   Procedure: EXCISION DUPUYTREN LEFT PALM, RING, SMALL, AND THUMB;  Surgeon: Cammie Sickle, MD;  Location: [facility];  Service: Orthopedics;  Laterality: Left;  . EYE SURGERY     both cataracts  . HEMORRHOID SURGERY    . LAMINECTOMY    . ROTATOR CUFF REPAIR    . TONSILLECTOMY    . TRANSURETHRAL RESECTION OF PROSTATE      His Family History Is Significant For: Family History  Problem Relation Age  of Onset  . Hypertension Father     His Social History Is Significant For: Social History   Social History  . Marital status: Married    Spouse name: N/A  . Number of children: 2  . Years of education: HS   Occupational History  . Retired    Social History Main Topics  . Smoking status: Former Research scientist (life sciences)  . Smokeless tobacco: Never Used     Comment: Quit 1958  . Alcohol use No  . Drug use: No  . Sexual activity: Not Asked   Other Topics Concern  . None   Social History Narrative   Retired ,Scientist, clinical (histocompatibility and immunogenetics), married .    His Allergies Are:  Allergies  Allergen Reactions  . Amlodipine Other (See Comments)    Fatigue   . Codeine     unknown  . Metformin And Related Other (See Comments)    fatigue  . Statins     "pass out, dizzy, drunk feeling, poor balance"  . Welchol [Colesevelam Hcl]   :   His Current Medications Are:  Outpatient Encounter Prescriptions as of 01/04/2017  Medication Sig  . apixaban (ELIQUIS) 5 MG TABS tablet Take 1 tablet (5 mg total) by mouth 2 (two) times daily.  Marland Kitchen aspirin EC 81 MG EC tablet Take 1 tablet (81 mg total) by mouth daily.  Marland Kitchen glipiZIDE (GLUCOTROL) 5 MG tablet Take 1 tablet (5 mg total) by mouth daily before breakfast.  . isosorbide mononitrate (IMDUR) 30 MG 24 hr tablet Take 0.5 tablets (15 mg total) by mouth daily.  . pravastatin (PRAVACHOL) 20 MG tablet Take 1 tablet (20 mg total) by mouth daily at 6 PM.  . linaclotide (LINZESS) 290 MCG CAPS capsule Take 1 capsule (290 mcg total) by mouth daily before breakfast. (Patient not taking: Reported on 01/04/2017)   No facility-administered encounter medications on file as of 01/04/2017.   :  Review of Systems:  Out of a complete 14 point review of systems, all are reviewed and negative with the exception of these symptoms as listed below: Review of Systems  Neurological:       Patient had a stroke 10/28/2016. Affected the L side, now resolved.  Son reports increased memory issues.  Patient states that sometimes he hears "a noise" in his head.  Increased trouble finding words.  Son was told to ask if patient needs to stay on ASA and Eliquis?     Objective:  Neurologic Exam  Physical Exam Physical Examination:   Vitals:   01/04/17 1012  BP: (!) 142/76  Pulse: 80  Resp: 16   General Examination: The  patient is a very pleasant 83 y.o. male in no acute distress. He appears well-developed and well-nourished and well groomed.   HEENT: Normocephalic, atraumatic, pupils are equal, round and reactive to light and accommodation. Extraocular tracking is good without limitation to gaze excursion or nystagmus noted. Normal smooth pursuit is noted. Hearing is grossly intact. Tympanic membranes are clear bilaterally. Face is symmetric with normal facial animation and normal facial sensation. Speech is clear with no dysarthria noted. There is no hypophonia. There is no lip, neck/head, jaw or voice tremor. Neck is supple with full range of passive and active motion. There are no carotid bruits on auscultation. Oropharynx exam reveals: mild mouth dryness, adequate dental hygiene with full dentures on top and partials on the bottom, overall mild to moderate airway crowding secondary to redundant soft palate and wider uvula, tonsils are absent. Mallampati is class II. Tonsils are absent, no dysarthria is noted, perhaps mild hypophonia.    Chest: Clear to auscultation without wheezing, rhonchi or crackles noted.  Heart: S1+S2+0, regular and normal without murmurs, rubs or gallops noted.   Abdomen: Soft, non-tender and non-distended with normal bowel sounds appreciated on auscultation.  Extremities: There is no pitting edema in the distal lower extremities bilaterally. Pedal pulses are intact.  Skin: Warm and dry without trophic changes noted.  Musculoskeletal: exam reveals no obvious joint deformities, tenderness or joint swelling or erythema.   Neurologically:  Mental status: The  patient is awake, alert but not fully oriented. His immediate and remote memory, attention, language skills and fund of knowledge are impaired. There is no evidence of aphasia, agnosia, apraxia or anomia. Speech is clear with normal prosody and enunciation. Thought process is linear. Mood is normal and affect is normal.  On 01/04/2017: MMSE 19/30, CDT: 4/4, AFT: 6/min.    Cranial nerves II - XII are as described above under HEENT exam. In addition: shoulder shrug is normal with equal shoulder height noted. Motor exam: Normal bulk, strength and tone is noted, with the exception of very subtle right hip flexor weakness noted, could be normal as well. No drift or tremor is noted, Romberg is not tested today secondary to dizziness reported. Reflexes are 1-2+ and symmetrical. Fine motor skills are mildly impaired globally. He stands up with no significant difficulty and gait shows no limp or circumduction. He has no walking aid. Sensory exam: intact to light touch in the upper and lower extremities.  There are no cerebellar signs as in intention tremor or gait ataxia.               Assessment and Plan:   In summary, Jonathan [name] is a very pleasant 83 y.o.-year old male with An underlying medical history of degenerative disc disease, BPH, coronary artery disease with status post CABG, hypertension, history of paroxysmal A. fib, type 2 diabetes with history of retinopathy, hyperlipidemia, and recent left thalamic stroke in January 2018, who presents for follow-up consultation. I saw him last year for history of dizziness and balance problems and we did workup for this which was benign. This was in the form of brain MRI and EEG at the time. He has no significant residual motor symptoms or signs of sensory deficit since his stroke, he has had more nonspecific issues, motivational issues and cognitive decline. He is at risk for vascular dementia. We talked about his recent hospitalization and workup at length  today. He has no significant changes on physical exam. He is advised to ask his primary care  physician about seeing another cardiologist. We talked about secondary stroke prevention quite a bit today. While not telltale, his history could indicate underlying obstructive sleep apnea and physical exam would support that as well. As far as diagnostic testing, I would like to proceed with a sleep study to rule out obstructive sleep apnea. He is advised to continue with his current medications and his follow-up with his neuropsychologist. We will monitor memory scores. I would not suggest a memory medication quite as yet. We should also monitor him for signs and symptoms of depression. Of note, he has had some motivational issues. I suggested he try to increase his water intake and also exercise on a more daily basis, within his motor limitations of course. I will check with Dr. Erlinda Hong if it is recommended that he stay on baby aspirin as well as Eliquis.  I would like to see him back after the sleep studies completed. We talked about potential CPAP therapy for sleep apnea also today. I answered all their questions today and the patient and his son were in agreement with the plan. I spent 40 minutes in total face-to-face time with the patient, more than 50% of which was spent in counseling and coordination of care, reviewing test results, reviewing medication and discussing or reviewing the diagnosis of stroke, dizziness, cognitive decline, OSA, the prognosis and treatment options. Pertinent laboratory and imaging test results that were available during this visit with the patient were reviewed by me and considered in my medical decision making (see chart for details).

## 2017-01-04 NOTE — Telephone Encounter (Signed)
Please call patient or son, we talked about aspirin during the visit today.  I double checked with Dr. Erlinda Hong: Since he has been established on eliquis, from the neurological standpoint, he does not need to be on aspirin as well. He can stop the baby aspirin as of right now. Unless it needs to be restarted from the cardiology standpoint which I do not see a reason why that should be, he is okay to maintain his Eliquis 5 mg bid.

## 2017-01-05 NOTE — Telephone Encounter (Signed)
I called pt, advised him that Dr. Rexene Alberts spoke with Dr. Erlinda Hong and wanted him to know that he does not need to take a baby aspirin unless cardiology thinks it necessary. I advised pt to keep taking his eliquis 5mg  BID. I have been unable to reach this patient by phone. Pt verbalized understanding. Pt had no questions at this time but was encouraged to call back if questions arise.

## 2017-01-07 ENCOUNTER — Ambulatory Visit: Payer: Medicare Other | Admitting: Physical Medicine & Rehabilitation

## 2017-01-07 ENCOUNTER — Encounter (HOSPITAL_BASED_OUTPATIENT_CLINIC_OR_DEPARTMENT_OTHER): Payer: Medicare Other | Admitting: Psychology

## 2017-01-07 ENCOUNTER — Ambulatory Visit: Payer: Medicare Other

## 2017-01-07 DIAGNOSIS — I69319 Unspecified symptoms and signs involving cognitive functions following cerebral infarction: Secondary | ICD-10-CM

## 2017-01-07 DIAGNOSIS — F4323 Adjustment disorder with mixed anxiety and depressed mood: Secondary | ICD-10-CM | POA: Diagnosis not present

## 2017-01-07 NOTE — Progress Notes (Signed)
Continue to work on Therapist, occupational and strategies around residual effects of a stroke. The patient remained resistant to engage in all of the therapeutic interventions in activities that are being requested of him. We worked on this issue as well as discussing some of these issues with his son and wife.    Patient:  Jonathan [name]   DOB: 1929/07/28  MR Number: [id number]  Location: [facility] [address], [place] [address] Dept: [phone]  Start: 1 PM End: 3 PM  Provider/Observer:     Edgardo Roys PSYD  Chief Complaint:      Chief Complaint  Patient presents with  . Memory Loss  . Other    patient just does not feel like himself    Reason For Service:     The patient was referred by Dr. Letta Pate for psychological/neuropsychological interventions. The patient vascular event in January 2018 where he presented with confusion. 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 The patient has had adjustment difficulties after his hospitalization and is reluctant to go through some of the rehabilitation efforts. He has continued to have some confusion which have played a role in his reluctance. There was a referral for psychological interventions to help him better understand how his potential underlying anxiety and frustration with what has been going on with him is negatively impacting his follow-through in some of the rehabilitation in speech therapy efforts. The patient had a stroke that essentially was described as a thalamic infarct on January 17. The patient has been frustrated about what is happening with him and describes worry and anxiety around his inability to improve and is not very cooperative with the therapist he has been working with.  Testing Administered:  RBANS Form A and  WAIS-III  Participation Level:   Active  Participation Quality:  Appropriate      Behavioral Observation:  Well Groomed, Alert, and Blunt.   Test Results:    DSM-IV Criteria for Dementia A. Development of multiple cognitive deficits: Marland Kitchen Memoryimpairment, plus . One or more Cognitive Disturbances: - Aphasia: disorder of language - Apraxia: impaired motor activities - Agnosia: inability to recognize or identify objects - Dysexecutive: defect defective initiation, planning, organization/abstraction B. Cognitive deficits cause significant impairment in social/occupational functioning and are a decline from previous level of functioning  Mild Cognitive Impairment . Patients who are memory impaired but are otherwise functioning well and do not meet clinical criteria for dementia are classified as having MCI . Symptoms include - Memory complaint, preferably with corroboration - Objective memory impairment - Normal general cognitive function - Intact activities of daily living - Not demented . Patients with MCI should be recognized and monitored for cognitive and functional decline due to their increased risk for subsequent dementia   Qualitative Description of RBANS Index Scores      Classification Descriptors for Subtest Scaled Scores Above 16 very superior       Total Index Score:  71  The patient's performance falls in the borderline range of functioning with regard to his composite score of 71. However, there was tremendous variability within subtest performance and therefore there were clearly areas where he was functioning quite well relative to normative comparison group while other areas he was doing very poorly. Visual-spatial performance and initial auditory encoding were both either in the average range or superior range of functioning  however, the patient had significant difficulties with immediate memory and delayed memory as well as verbal fluency  subtest.  Total Index Score . Composite of all indexes within the battery. Kermit Balo indicator of general cognitive functioning . Low scores strongly suggest general cognitive impairment even when some individual subtest scores may be within normal limits. . Individuals with low scores on this measure exhibit problems with attention memory language and constructional skills attention, memory, language, skills. . Scores in low average to borderline range indicate either general lowering of skills across domains or variability in cognitive functioning with some lower and higher scores.     Immediate Memory Index:  44  The patient's performance on his immediate memory index was in the extremely low range. The patient displayed significant impairments with regard to learning a list of common words. The patient also had great difficulty with regard to short-term storage of information for story learning memory.     Immediate Memory Index . Measure of initial encoding and learning of complex and simple verbal information . Low scores indicate difficulties with verbal learning . Low average or borderline scores on this index can represent general low average verbal memory functioning or may occur when there is variability in the subtests that comprise the index. . Related to ability to self-medicate, manage finances, remember appointments, cook, drive . Impacts all other areas       Visuospatial / Constructional Index:  126  The patient displayed significant skill with regard to his visual-spatial and constructional abilities. While his was able to effectively he did exceptionally well on the line orientation test. This really looks at his visual spatial abilities patient did quite well. This is likely indicative of pre-existing skill in these areas but also the fact that none of these areas of abilities of been affected by stroke.   Visuospatial / Constructional Index . Measures basic  visuospatial perception and ability to copy a design from a model. . Low scores indicate difficulties with processing and using visuospatial information. And may also occur in examinees with severe visual impairments or attention disorders such as hemineglect. . Low average or borderline scores can represent general low average visuospatial functioning or may occur when there is variability in the subtests that comprise the index. Impacts visuospatial processing . Impacts driving, food preparation, use of tools       Attention Index:  91  The patient's performance on the attentional index falls at the lower end of the average range. The patient did exceptionally well with regard to his auditory encoding abilities but did have some difficulties with his overall speed of mental operations and focus execute abilities is significant split suggest that the patient's overall speed of mental operations and processing or problematic for him but that he continues to have a exceptional ability with regard to his auditory encoding, which is a prerequisite for memory and learning. Therefore his difficulties in this area do with speed of information processing with exceptional abilities for auditory encoding.    Attention Index . Measure of simple auditory registration and visual scanning and processing speed. . Low scores indicate difficulties with basic attention processes and speed of information processing. . Low average or borderline scores can represent general low average attention and processing speed or may occur when there is variability in the subtests that comprise the index. . Affects all ADLs      Language Indes:  76  The patient's performance on language subtest falls in the borderline range  of functioning. This is a moderate level of impairment. The patient had moderate deficits with regard to naming particular objects but had significant deficits with regard to verbal fluency  abilities. There were no indications of receptive language deficits but his limitations has to do with verbal fluency and not with specific expressive language deficits.  Language Index . Measure of expressive language functioning. . Low scores indicate difficulties with fluent use of language. . Primarily indicative of expressive language functioning, but individuals with receptive language impairments will also have low scores on this measure. Marland Kitchen Speech Comprehension (Receptive Speech) - Ability to respond to questions - Ability to react appropriately to comments - Ability to respond to instructions for simple tests . Expressive speech - Fluency - Articulation - Prosody - Naming - Repetition    Delayed Memory Index:  48  The patient's performance on this measure falls in the significantly were severely impaired range. The patient had severe deficits with regard to recall information after period of delay. However, his performance was better relative to normative expectations with regard to delayed memory versus immediate memory. This pattern suggests that if information is transferred from encoding function to short-term memory that it is available for recall after period of delay. This clearly suggest that the primary deficit has to do with transferring information from encoding storage to short-term memory stores. If information is initially learned it is generally available for recall that a later time.  Delayed Memory Index . Measure of delayed recall and recognition for verbal and visual information. . Low scores indicate difficulties with recognition and retrieval of information from long-term memory stores. . Impacts all areas.   GENERAL INTELLECTUAL FUNCTIONING:  The patient's performance on the Weschler Adult Intellectual Scale-III, classifies his current intellectual abilities to be in the low average Range, with a Full Scale IQ score of 84, a Verbal IQ score of 79, and a  Performance IQ score of 92.  Overall, his performance places his at the 14th percentile with regard to his age-related peer group.  Analysis on Index Scores produced a Verbal Comprehension Index Score of 74, a Perceptual Organization Index Score of 93, a Working Memory Index Score of 94, and a Processing Speed Index Score of 79.   Overall, WAIS-III performance suggest that the patient's VIQ vs. PIQ were indicative of some significant differences in variability in subtest performance. The patient showed some considerable variability between his verbal and performance global areas of functioning with his visual-spatial abilities to be quite good. The patient showed strength with regard to his visual spatial and perceptual organizational abilities as well as his auditory/short-term encoding abilities. The patient had some significant difficulties with verbal comprehension and verbal reasoning abilities as well as his overall speed of mental operations.     Below are the Age-Adjusted scores for each of the individual WAIS-III subtests.  Vocabulary:    5 Similarities:    4 Arithmetic:    8 Digit Span:    11 Information:    7 Comprehension:   5 Letter Number Sequencing:  9  Picture Completion:   9 Digit Symbol:    8 Block Design:    8 Matrix Reasoning:   10 Picture Arrangement:   10 Symbol Search:   4  WAIS-III analysis reveals a VIQ score of 79, which places his at the eighth percentile.  Significant scatter was noted in subtest performance.  The patient displayed no significant strength relative to his age matched your group.  he performed in  the average range on measures of auditory encoding with straight encoding being an exceptional strength and if there were any multiprocessing demands his performance deteriorated rapidly.  The patient also performed in the average range regard to his mathematical abilities attention was expected. The patient displayed moderately impaired performance on measures  of his vocabulary knowledge, verbal reasoning abilities, general fund of information, and social judgment comprehension abilities.   Clearly, the patient's ability to pull out general information that he should've learned in life as well as his social judgment, comprehension and verbal reasoning abilities are all impaired and likely indicative of significant loss of function from pre-existing abilities.    WAIS-III analysis reveals a PIQ score of 92, which places him at the 30th percentile. Significant scatter was seen on these measures.   Average performance was noted on his ability to develop and understand visual gestalts, visual searching and sequencing, visual analysis and organization, visual reasoning and visual sequencing.  THe patient had significant impairments on measures of visual scanning speed and general speed of mental operations.    Overall, WAIS-III performance was in the Low Average range.  This pattern of strengths and weaknesses is globally consistent with the impact of his recent stroke.   The patients performance on various subtests suggest that there has been a significant loss of function in the verbal areas of vocabulary, verbal reasoning, social judgment and comprehension and speed of mental operations.  Other areas appear to remain intact.  These include mathematical abilities, auditory encoding, visual spatial abilities and visual reasoning.      Summary of Results:   Results of the current neuropsychological evaluation are consistent with significant memory deficits, reduction and verbal fluency and efficiency, reduction overall speed of mental operations, a loss of basic vocabulary, impairments with regard to verbal reasoning and problem-solving, a loss and social judgment comprehension abilities. However, the patient shows continued strength with regard to his visual-spatial and perceptual organizational abilities, visual reasoning and problem-solving, and visual scanning  abilities. The memory deficits are directly related to the transfer of information from an effective encoding/inital storage to short term memory stores.  The limited information that is getting into short term memory is being stored in a way that allows for later retrieval.  These are consistent with the effects of the recent stroke. They're not consistent with other forms of dementia that would be degenerative or progressive in nature and unless there are further strokes we are likely not to see any significant loss of functioning outside of the effects of aging this pattern of strengths and weaknesses is clearly consistent with a focal lesioning reduced by cerebrovascular accident/stroke.  Recommendations:   Given the timeline and the acute nature of his recent stroke, I would highly recommend the patient actively participate in any therapeutic efforts such as speech therapy, which is also helping him in other cognitive areas. The patient is reported to not be physically active at home and this will be very important for him to begin to increase his physical activity. The patient is not delusional and has memory capacity to avoid danger if left alone left alone. His judgement appears intact but his motivation is severely impaired.  His deficits have a lot to do with impairments in expressive language.  Mostly the impairments in his internal self talk that he has utilized in the past for motivation organizing his thinking. This confused internal self language/talk is likely playing a role in lack of motivation and initiative. I would highly recommend  assist from family members to help him engaged in his possible.  Diagnosis:    Axis I: Cognitive deficit due to recent stroke  Adjustment disorder with mixed anxiety and depressed mood   Ilean Skill, Psy.D. Neuropsychologist

## 2017-01-11 ENCOUNTER — Telehealth: Payer: Self-pay

## 2017-01-12 ENCOUNTER — Telehealth: Payer: Self-pay

## 2017-01-12 ENCOUNTER — Encounter: Payer: Medicare Other | Attending: Psychology

## 2017-01-12 ENCOUNTER — Encounter: Payer: Self-pay | Admitting: Physical Medicine & Rehabilitation

## 2017-01-12 ENCOUNTER — Ambulatory Visit (HOSPITAL_BASED_OUTPATIENT_CLINIC_OR_DEPARTMENT_OTHER): Payer: Medicare Other | Admitting: Physical Medicine & Rehabilitation

## 2017-01-12 VITALS — BP 152/68 | HR 85 | Resp 14

## 2017-01-12 DIAGNOSIS — Z8249 Family history of ischemic heart disease and other diseases of the circulatory system: Secondary | ICD-10-CM | POA: Insufficient documentation

## 2017-01-12 DIAGNOSIS — I10 Essential (primary) hypertension: Secondary | ICD-10-CM | POA: Diagnosis not present

## 2017-01-12 DIAGNOSIS — Z7984 Long term (current) use of oral hypoglycemic drugs: Secondary | ICD-10-CM | POA: Insufficient documentation

## 2017-01-12 DIAGNOSIS — E119 Type 2 diabetes mellitus without complications: Secondary | ICD-10-CM | POA: Diagnosis not present

## 2017-01-12 DIAGNOSIS — Z79899 Other long term (current) drug therapy: Secondary | ICD-10-CM | POA: Insufficient documentation

## 2017-01-12 DIAGNOSIS — I2510 Atherosclerotic heart disease of native coronary artery without angina pectoris: Secondary | ICD-10-CM | POA: Insufficient documentation

## 2017-01-12 DIAGNOSIS — F4323 Adjustment disorder with mixed anxiety and depressed mood: Secondary | ICD-10-CM | POA: Insufficient documentation

## 2017-01-12 DIAGNOSIS — Z7901 Long term (current) use of anticoagulants: Secondary | ICD-10-CM | POA: Diagnosis not present

## 2017-01-12 DIAGNOSIS — I639 Cerebral infarction, unspecified: Secondary | ICD-10-CM

## 2017-01-12 DIAGNOSIS — Z951 Presence of aortocoronary bypass graft: Secondary | ICD-10-CM | POA: Diagnosis not present

## 2017-01-12 DIAGNOSIS — E785 Hyperlipidemia, unspecified: Secondary | ICD-10-CM | POA: Diagnosis not present

## 2017-01-12 DIAGNOSIS — I69319 Unspecified symptoms and signs involving cognitive functions following cerebral infarction: Secondary | ICD-10-CM

## 2017-01-12 DIAGNOSIS — I69920 Aphasia following unspecified cerebrovascular disease: Secondary | ICD-10-CM | POA: Diagnosis not present

## 2017-01-12 DIAGNOSIS — R413 Other amnesia: Secondary | ICD-10-CM | POA: Diagnosis present

## 2017-01-12 DIAGNOSIS — I6381 Other cerebral infarction due to occlusion or stenosis of small artery: Secondary | ICD-10-CM

## 2017-01-12 NOTE — Patient Instructions (Signed)
I recommend continuing outpatient speech therapy  I recommend that you continue to see Dr. Sima Matas as well as Dr. Rexene Alberts

## 2017-01-12 NOTE — Progress Notes (Signed)
Subjective:    Patient ID: Jonathan [name], male    DOB: 1929/01/30, 83 y.o.   MRN: [id number]  HPI Had  neuropsych testing- major deficits were immediate recall, expressive language Dressing and bathing self, no falls but some near falls, fell in his knees while picking up sticks in yard Daily walking 1.3 miles per day Not driving  Son relates that the patient sometimes gets frustrated when he tries to get patient to do some cognitive retraining exercises. Currently not receiving speech therapy services. The patient was not always cooperating with speech therapy because it did not interest him. Patient states that he sometimes feels like there is nothing wrong with them and sometimes he can tell there is something wrong with him from his stroke Pain Inventory Average Pain 0 Pain Right Now 0 My pain is no pain  In the last 24 hours, has pain interfered with the following? General activity 0 Relation with others 0 Enjoyment of life 0 What TIME of day is your pain at its worst? no pain Sleep (in general) Fair  Pain is worse with: no pain Pain improves with: no pain Relief from Meds: 0  Mobility walk without assistance how many minutes can you walk? 30 ability to climb steps?  yes do you drive?  no Do you have any goals in this area?  yes  Function retired Do you have any goals in this area?  yes  Neuro/Psych dizziness confusion depression  Prior Studies Any changes since last visit?  no CLINICAL DATA:  Fell while shoveling snow. Confusion, change in speech. History of hypertension, hyperlipidemia.  EXAM: MRI HEAD WITHOUT CONTRAST  TECHNIQUE: Multiplanar, multiecho pulse sequences of the brain and surrounding structures were obtained without intravenous contrast.  COMPARISON:  CT HEAD October 28, 2016 and MRI head Feb 21, 2016  FINDINGS: BRAIN: 15 x 11 mm focus of reduced diffusion mesial LEFT thalamus with low ADC values. 3 mm T2 hyperintensity LEFT  thalamus most consistent with perivascular space, present on prior MRI. No susceptibility artifact to suggest hemorrhage. Ventricles and sulci are normal for patient's age. Old small LEFT cerebellar infarct. Patchy supratentorial white matter FLAIR T2 hyperintensities. No abnormal extra-axial fluid collections.  VASCULAR: Similar probable slow flow RIGHT vertebral artery, major intracranial vascular flow voids otherwise maintained.  SKULL AND UPPER CERVICAL SPINE: No abnormal sellar expansion. No suspicious calvarial bone marrow signal. Craniocervical junction maintained.  SINUSES/ORBITS: Trace paranasal sinus mucosal thickening. Mastoid air cells are well aerated. Status post bilateral ocular lens implants. The included ocular globes and orbital contents are non-suspicious.  OTHER: None.  IMPRESSION: Acute 15 x 11 mm LEFT thalamus infarct.  Otherwise stable appearance of the brain including old small LEFT cerebellar infarct and mild to moderate chronic small vessel ischemic disease.   Electronically Signed   By: Elon Alas M.D.   On: 10/28/2016 21:28 Physicians involved in your care Any changes since last visit?  no   Family History  Problem Relation Age of Onset  . Hypertension Father    Social History   Social History  . Marital status: Married    Spouse name: N/A  . Number of children: 2  . Years of education: HS   Occupational History  . Retired    Social History Main Topics  . Smoking status: Former Research scientist (life sciences)  . Smokeless tobacco: Never Used     Comment: Quit 1958  . Alcohol use No  . Drug use: No  . Sexual activity: Not Asked  Other Topics Concern  . None   Social History Narrative   Retired ,Scientist, clinical (histocompatibility and immunogenetics), married .   Past Surgical History:  Procedure Laterality Date  . bil carpal tunnel surgery    . CARDIAC CATHETERIZATION N/A 10/01/2015   Procedure: Left Heart Cath and Cors/Grafts Angiography;  Surgeon: Peter M  Martinique, MD;  Location: [facility];  Service: Cardiovascular;  Laterality: N/A;  . COLONOSCOPY    . CORONARY ARTERY BYPASS GRAFT  2006    Coronary artery bypass grafting x 4 (left internal mammary  . DUPUYTREN CONTRACTURE RELEASE Left 04/05/2014   Procedure: EXCISION DUPUYTREN LEFT PALM, RING, SMALL, AND THUMB;  Surgeon: Cammie Sickle, MD;  Location: [facility];  Service: Orthopedics;  Laterality: Left;  . EYE SURGERY     both cataracts  . HEMORRHOID SURGERY    . LAMINECTOMY    . ROTATOR CUFF REPAIR    . TONSILLECTOMY    . TRANSURETHRAL RESECTION OF PROSTATE     Past Medical History:  Diagnosis Date  . Allergic rhinitis   . BPH (benign prostatic hypertrophy)   . Coronary atherosclerosis of native coronary artery   . Degeneration of lumbar or lumbosacral intervertebral disc   . Diabetes ([place])    no meds  . Dyslipidemia   . HOH (hard of hearing)   . Hyperlipidemia   . Hypertension   . Right leg weakness   . S/P CABG x 4 2006   LIMA-LAD, SVG-rPDA, sSVG-OM1-OM2  . Stroke ([place])   . Wears glasses    reading   BP (!) 152/68   Pulse 85   Resp 14   SpO2 93%   Opioid Risk Score:   Fall Risk Score:  `1  Depression screen PHQ 2/9  Depression screen PHQ 2/9 11/13/2016  Decreased Interest 3  Down, Depressed, Hopeless 2  PHQ - 2 Score 5  Altered sleeping 1  Tired, decreased energy 0  Change in appetite 2  Feeling bad or failure about yourself  0  Trouble concentrating 3  Moving slowly or fidgety/restless 2  Suicidal thoughts 0  PHQ-9 Score 13  Difficult doing work/chores Somewhat difficult    Review of Systems  Constitutional: Negative.   HENT: Negative.   Eyes: Negative.   Respiratory: Negative.   Cardiovascular: Negative.   Gastrointestinal: Negative.   Endocrine: Negative.   Genitourinary: Negative.   Musculoskeletal: Negative.   Skin: Negative.   Allergic/Immunologic: Negative.   Neurological: Positive for dizziness.  Hematological:  Negative.   Psychiatric/Behavioral: Positive for dysphoric mood.  All other systems reviewed and are negative.      Objective:   Physical Exam  Constitutional: He appears well-developed and well-nourished.  HENT:  Head: Normocephalic and atraumatic.  Eyes: Conjunctivae and EOM are normal. Pupils are equal, round, and reactive to light.  Neck: Normal range of motion.  Psychiatric: Thought content normal. His affect is blunt. His speech is delayed. His speech is not tangential and not slurred. He is slowed. He is not agitated. He exhibits abnormal recent memory.  Need to repeat commands. Once   motor strength is 5/5 bilateral deltoid, bicep, triceps, grip, hip flexor, knee extensor, ankle dorsiflexor. Romberg is positive. Falls toward the right. Wide base of support while ambulating. Unable to do tandem gait, falls toward the right. Mild dysmetria, right finger-nose-finger compared to left        Assessment & Plan:  1. Left thalamic infarct, good physical recovery, but has expressive language deficits  as well as immediate recall problems. He will follow-up with Dr. Sima Matas for more in depth conversation about testing results. I would recommend speech therapy for cognitive rehabilitation and have written an order. Patient's son states he will have to convince his dad to restart speech therapy. We discussed timeline of language and cognitive recovery following stroke, which is generally plateauing at 9-12 months post  Rec F/u Neurology No PMR f/u needed  Over half of the 25 min visit was spent counseling and coordinating care.

## 2017-01-12 NOTE — Telephone Encounter (Signed)
Call to schedule patient for a sleep study as ordered. Pt was very confused about order and where I was calling from. Spoke to wife and she denied him scheduling study at this time. She stated they did not think it was necessary.

## 2017-01-22 ENCOUNTER — Ambulatory Visit: Payer: Medicare Other | Attending: Physical Medicine & Rehabilitation | Admitting: *Deleted

## 2017-01-22 DIAGNOSIS — R41841 Cognitive communication deficit: Secondary | ICD-10-CM | POA: Insufficient documentation

## 2017-01-22 NOTE — Therapy (Signed)
[facility] [address] [place], [place], [address] Phone: [phone]   Fax:  [phone]  Speech Language Pathology Evaluation  Patient Details  Name: Jonathan [name] MRN: [id number] Date of Birth: 10/23/1928 Referring Provider: Dr. Alysia Penna  Encounter Date: 01/22/2017      End of Session - 01/22/17 1547    Visit Number 1   Number of Visits 17   Date for SLP Re-Evaluation 03/19/17   SLP Start Time 1405   SLP Stop Time  1510   SLP Time Calculation (min) 65 min   Activity Tolerance Patient tolerated treatment well      Past Medical History:  Diagnosis Date  . Allergic rhinitis   . BPH (benign prostatic hypertrophy)   . Coronary atherosclerosis of native coronary artery   . Degeneration of lumbar or lumbosacral intervertebral disc   . Diabetes ([place])    no meds  . Dyslipidemia   . HOH (hard of hearing)   . Hyperlipidemia   . Hypertension   . Right leg weakness   . S/P CABG x 4 2006   LIMA-LAD, SVG-rPDA, sSVG-OM1-OM2  . Stroke ([place])   . Wears glasses    reading    Past Surgical History:  Procedure Laterality Date  . bil carpal tunnel surgery    . CARDIAC CATHETERIZATION N/A 10/01/2015   Procedure: Left Heart Cath and Cors/Grafts Angiography;  Surgeon: Peter M Martinique, MD;  Location: [facility];  Service: Cardiovascular;  Laterality: N/A;  . COLONOSCOPY    . CORONARY ARTERY BYPASS GRAFT  2006    Coronary artery bypass grafting x 4 (left internal mammary  . DUPUYTREN CONTRACTURE RELEASE Left 04/05/2014   Procedure: EXCISION DUPUYTREN LEFT PALM, RING, SMALL, AND THUMB;  Surgeon: Cammie Sickle, MD;  Location: [facility];  Service: Orthopedics;  Laterality: Left;  . EYE SURGERY     both cataracts  . HEMORRHOID SURGERY    . LAMINECTOMY    . ROTATOR CUFF REPAIR    . TONSILLECTOMY    . TRANSURETHRAL RESECTION OF PROSTATE      There were no vitals filed for this  visit.      Subjective Assessment - 01/22/17 1521    Subjective "I don't have the desire to "do" any more.   Patient is accompained by: Family member  son [name]   Currently in Pain? No/denies            SLP Evaluation [facility] - 01/22/17 1521      SLP Visit Information   SLP Received On 01/22/17   Referring Provider Dr. Alysia Penna   Onset Date October 28, 2016   Medical Diagnosis left thalamic infarct     Subjective   Subjective Pt seen for evaluation with son present.   Patient/Family Stated Goal none stated     General Information   HPI 83 year old male referred for outpatient speech therapy evaluation following [facility] inpatient rehab stay. PMH significant for left thalamic CVA (January 2018). See above for additional PMH information   Behavioral/Cognition Pt cooperative with evaluation; mind set and difficult to change   Mobility Status Ambulates without assistive device     Prior Functional Status   Cognitive/Linguistic Baseline Baseline deficits   Baseline deficit details Cognitive linguistic deficits (recall, attention, awareness) following January 2018 CVA and inpatient rehab stay. Significant improvement noted in linguistic skills during CIR.   Type of Home House    Lives With Spouse  Available Support Family   Education high school   Vocation Retired     Associate Professor   Overall Cognitive Status History of cognitive impairments - at baseline   Attention Focused;Sustained;Selective;Alternating;Divided   Focused Attention Impaired   Focused Attention Impairment Verbal basic   Sustained Attention Impaired   Sustained Attention Impairment Verbal basic   Selective Attention Impaired   Selective Attention Impairment Verbal basic   Alternating Attention Impaired   Alternating Attention Impairment Verbal basic   Divided Attention Impaired   Divided Attention Impairment Verbal basic   Memory Impaired   Memory Impairment Storage deficit;Retrieval deficit;Decreased  recall of new information;Decreased short term memory;Prospective memory   Decreased Short Term Memory Verbal basic   Awareness Impaired   Awareness Impairment Intellectual impairment;Emergent impairment;Anticipatory impairment   Problem Solving Impaired   Problem Solving Impairment Verbal basic   Executive Function Reasoning;Sequencing;Organizing;Decision Making;Initiating;Self Monitoring;Self Correcting   Reasoning Impaired   Reasoning Impairment Verbal basic   Sequencing Appears intact   Organizing Impaired   Organizing Impairment Verbal basic   Decision Making Impaired   Decision Making Impairment Verbal basic   Initiating Impaired   Initiating Impairment Verbal basic   Self Monitoring Impaired   Self Monitoring Impairment Verbal basic   Self Correcting Impaired   Self Correcting Impairment Verbal basic   Behaviors Poor frustration tolerance     Auditory Comprehension   Overall Auditory Comprehension Impaired   Yes/No Questions Impaired   Complex Questions 25-49% accurate  anticipate deficit related to attention rather than comprehension   Commands Within Functional Limits  pt followed 3 step verbal command, repeating it accurately   Conversation Simple   Other Conversation Comments Pt reports he has "noise" in his head at all times   Interfering Components Attention;Working Building services engineer Increased volume;Repetition     Psychologist, counselling Exceptions to [employer] Other (comment)  difficulty with visuoperception subtest of MoCA     Reading Comprehension   Reading Status Not tested     Expression   Primary Mode of Expression Verbal     Verbal Expression   Overall Verbal Expression Impaired   Initiation No impairment   Level of Generative/Spontaneous Verbalization Conversation   Repetition No impairment   Naming Impairment   Responsive 76-100% accurate   Confrontation 50-74% accurate   Convergent  50-74% accurate   Divergent 50-74% accurate   Interfering Components Attention     Written Expression   Dominant Hand Right   Written Expression Not tested     Oral Motor/Sensory Function   Overall Oral Motor/Sensory Function Appears within functional limits for tasks assessed     Motor Speech   Overall Motor Speech Appears within functional limits for tasks assessed     Standardized Assessments   Standardized Assessments  Montreal Cognitive Assessment ([place])   Montreal Cognitive Assessment ([place])  16/30                         SLP Education - 01/22/17 1546    Education provided Yes   Education Details identify goals - activities he would like to do that he unable to do at this time   Person(s) Educated Child(ren)  son [name]   Methods Explanation;Demonstration;Verbal cues   Comprehension Verbalized understanding;Need further instruction;Verbal cues required          SLP Short Term Goals - 01/22/17 1557      SLP SHORT TERM GOAL #1  Title Pt will increase intellectual awareness of deficits by verbalizing 3 cognitive linguistic deficits with mod cues required   Time 4   Period Weeks   Status New     SLP SHORT TERM GOAL #2   Title Pt will identify effective strategies for improvement of functional recall with mod assist   Time 4   Period Weeks   Status New     SLP SHORT TERM GOAL #3   Title Pt will complete Level 1 naming tasks, naming 10 items per concrete category with mod assist.   Time 4   Period Weeks   Status New          SLP Long Term Goals - 01-26-17 1603      SLP LONG TERM GOAL #1   Title Pt will increase intellectual awareness of deficits by verbalizing 3 cognitive linguistic deficits and effective compensatory strategies with min cues required   Time 8   Period Weeks   Status New     SLP LONG TERM GOAL #2   Title Pt will identify effective strategies for improvement of functional recall with min assist, and report use of  strategies across 3 sessions   Time 8   Period Weeks   Status New     SLP LONG TERM GOAL #3   Title Pt will complete Level 3 naming tasks, naming 5-10 items per abstract category with min assist.   Time 8   Period Weeks   Status New          Plan - 01/26/17 1548    Clinical Impression Statement Pt presents for evaluation following rehab stay after left thalamic infarct in January 2018. Formal and informal assessments were completed following interview with pt and his son. The Montreal Cognitive Assessment (MoCA) was administered. Pt scored 16/30 (n=26+/30), indicating moderate cognitive impairment. Deficits noted on this assessment have functional implications in the areas of attention to detail, immediate and delayed functional recall, thought organization, abstract reasoning, viual problem solving, naming, and higher levels of attention. Pt continues to exhibit higher level linguistic deficits as well, including inaccurate complex yes/no responses (more likely related to poor attention than decreased auditory comprehension), and difficulty with abstract naming tasks (homonyms, abstract categorization). Pt also exhibits minimal awareness of functional deficits, and reports a lack of desire to "do" anything. [name] was present during this assessment, and reports pt has little motivation at home, but feels they need "something" to help pt continue to progress. Pt is of the mindset that he has recovered from a stroke in the past, and the same will happen this time (although there was not documented previous stroke per son). Skilled ST is recommended 2x/week for 8 weeks to address cognitive linguistic deficits identified today, to maximize cognitive function for decreased caregiver burden and increase pt safety and independence.    Speech Therapy Frequency 2x / week   Duration --  8 weeks   Treatment/Interventions SLP instruction and feedback;Compensatory strategies;Functional tasks;Patient/family  education;Multimodal communcation approach;Cognitive reorganization;Compensatory techniques   Potential to Achieve Goals Fair   Potential Considerations Ability to learn/carryover information;Cooperation/participation level;Family/community support   Consulted and Agree with Plan of Care Patient;Family member/caregiver   Family Member Consulted son [name]      Patient will benefit from skilled therapeutic intervention in order to improve the following deficits and impairments:   Cognitive communication deficit      G-Codes - 01-26-17 1605    Functional Assessment Tool Used asha noms, clinical judgment, MoCA  Functional Limitations Memory   Memory Current Status [phone]) At least 40 percent but less than 60 percent impaired, limited or restricted   Memory Goal Status (N1833) At least 1 percent but less than 20 percent impaired, limited or restricted      Problem List Patient Active Problem List   Diagnosis Date Noted  . Cognitive deficit, post-stroke 01/12/2017  . Aphasia, late effect of cerebrovascular disease 01/12/2017  . Right hemiparesis ([place])   . Cognitive dysfunction   . Coronary artery disease involving coronary bypass graft of native heart without angina pectoris   . Expressive aphasia 10/30/2016  . Thalamic infarct, acute ([place]) 10/30/2016  . Cerebral thrombosis with cerebral infarction 10/29/2016  . Acute CVA (cerebrovascular accident) ([place]) 10/29/2016  . Syncope 10/28/2016  . Long-term (current) use of anticoagulants 10/17/2015  . Typical atrial flutter ([place])   . Diabetes mellitus ([place]) 09/28/2015  . Paroxysmal atrial fibrillation ([facility])   . Coronary atherosclerosis of native coronary artery 07/14/2013  . Mixed hyperlipidemia 07/14/2013  . Hypertensive heart disease without CHF    Celia B. Chardon, MSP, CCC-SLP  Shonna Chock 01/22/2017, 4:05 PM  [facility] [address] [place], [place],  [address] Phone: [phone]   Fax:  [phone]  Name: TALVIN CHRISTIANSON MRN: [id number] Date of Birth: [birth year]

## 2017-01-26 ENCOUNTER — Ambulatory Visit: Payer: Medicare Other | Admitting: *Deleted

## 2017-01-28 ENCOUNTER — Ambulatory Visit: Payer: Medicare Other | Admitting: Speech Pathology

## 2017-01-28 DIAGNOSIS — R41841 Cognitive communication deficit: Secondary | ICD-10-CM | POA: Diagnosis not present

## 2017-01-28 NOTE — Therapy (Signed)
[facility] [address] [place], [place], [address] Phone: [phone]   Fax:  [phone]  Speech Language Pathology Treatment  Patient Details  Name: Jonathan [name] MRN: [id number] Date of Birth: [birth year] Referring Provider: Dr. Loni Muse  Encounter Date: 01/28/2017      End of Session - 01/28/17 1231    Visit Number 2   Number of Visits 17   Date for SLP Re-Evaluation 03/19/17   SLP Start Time 0845   SLP Stop Time  0932   SLP Time Calculation (min) 47 min   Activity Tolerance Patient tolerated treatment well      Past Medical History:  Diagnosis Date  . Allergic rhinitis   . BPH (benign prostatic hypertrophy)   . Coronary atherosclerosis of native coronary artery   . Degeneration of lumbar or lumbosacral intervertebral disc   . Diabetes ([place])    no meds  . Dyslipidemia   . HOH (hard of hearing)   . Hyperlipidemia   . Hypertension   . Right leg weakness   . S/P CABG x 4 2006   LIMA-LAD, SVG-rPDA, sSVG-OM1-OM2  . Stroke ([place])   . Wears glasses    reading    Past Surgical History:  Procedure Laterality Date  . bil carpal tunnel surgery    . CARDIAC CATHETERIZATION N/A 10/01/2015   Procedure: Left Heart Cath and Cors/Grafts Angiography;  Surgeon: Peter M Martinique, MD;  Location: [facility];  Service: Cardiovascular;  Laterality: N/A;  . COLONOSCOPY    . CORONARY ARTERY BYPASS GRAFT  2006    Coronary artery bypass grafting x 4 (left internal mammary  . DUPUYTREN CONTRACTURE RELEASE Left 04/05/2014   Procedure: EXCISION DUPUYTREN LEFT PALM, RING, SMALL, AND THUMB;  Surgeon: Cammie Sickle, MD;  Location: [facility];  Service: Orthopedics;  Laterality: Left;  . EYE SURGERY     both cataracts  . HEMORRHOID SURGERY    . LAMINECTOMY    . ROTATOR CUFF REPAIR    . TONSILLECTOMY    . TRANSURETHRAL RESECTION OF PROSTATE      There were no vitals filed for this  visit.             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 Pt required encouragement to participate in structured activities in [place].   - Altenrating attention and simple reasoning in card sort with 3 rules, written cues for rules with extended time and usual min cues. Son present, trained in cognitive actvities to do at home. Naming homework provided.      Assessment / Recommendations / Plan   Plan Continue with current plan of care     Progression Toward Goals   Progression toward goals Progressing toward goals          SLP Education - 01/28/17 1227    Education provided Yes   Education Details Compensations for word finding difficulties, cognitive activities to do at home, rationale for ST   Person(s) Educated Patient;Child(ren)  Methods Explanation;Demonstration;Verbal cues;Handout   Comprehension Verbalized understanding;Verbal cues required;Need further instruction          SLP Short Term Goals - 01/28/17 1231      SLP SHORT TERM GOAL #1   Title Pt will increase intellectual awareness of deficits by verbalizing 3 cognitive linguistic deficits with mod cues required   Time 4   Period Weeks   Status On-going     SLP SHORT TERM GOAL #2   Title Pt will identify effective strategies for improvement of functional recall with mod assist   Time 4   Period Weeks   Status On-going     SLP SHORT TERM GOAL #3   Title Pt will complete Level 1 naming tasks, naming 10 items per concrete category with mod assist.   Time 4   Period Weeks    Status On-going          SLP Long Term Goals - 01/28/17 1231      SLP LONG TERM GOAL #1   Title Pt will increase intellectual awareness of deficits by verbalizing 3 cognitive linguistic deficits and effective compensatory strategies with min cues required   Time 8   Period Weeks   Status On-going     SLP LONG TERM GOAL #2   Title Pt will identify effective strategies for improvement of functional recall with min assist, and report use of strategies across 3 sessions   Time 8   Period Weeks   Status New     SLP LONG TERM GOAL #3   Title Pt will complete Level 3 naming tasks, naming 5-10 items per abstract category with min assist.   Time 8   Period Weeks   Status On-going          Plan - 01/28/17 1228    Clinical Impression Statement Mr. Din required frequent to ususal mod A for simple divergent naming task and simple reasoning/attention tasks. Overall he appears apathic, with poor motivation to  participate in daily activites,  breakfast with friends. Son and pt report he  sits at home and stares at the wall.  Pt required consisent ongoing cues re: cognitive linguistic deficits due to stroke due to poor awareness. Continue skilled ST to maximize cognitive for improved QOL and independence.    Speech Therapy Frequency 2x / week   Duration --  8 weeks   Treatment/Interventions SLP instruction and feedback;Compensatory strategies;Functional tasks;Patient/family education;Multimodal communcation approach;Cognitive reorganization;Compensatory techniques   Potential to Achieve Goals Fair   Potential Considerations Ability to learn/carryover information;Cooperation/participation level;Family/community support   Consulted and Agree with Plan of Care Patient;Family member/caregiver   Family Member Consulted son [name]      Patient will benefit from skilled therapeutic intervention in order to improve the following deficits and impairments:   Cognitive communication  deficit    Problem List Patient Active Problem List   Diagnosis Date Noted  . Cognitive deficit, post-stroke 01/12/2017  . Aphasia, late effect of cerebrovascular disease 01/12/2017  . Right hemiparesis ([place])   . Cognitive dysfunction   . Coronary artery disease involving coronary bypass graft of native heart without angina pectoris   . Expressive aphasia 10/30/2016  . Thalamic infarct, acute ([place]) 10/30/2016  . Cerebral thrombosis with cerebral infarction 10/29/2016  . Acute CVA (cerebrovascular accident) ([place]) 10/29/2016  . Syncope 10/28/2016  . Long-term (current) use of anticoagulants 10/17/2015  . Typical atrial flutter ([place])   . Diabetes mellitus ([place]) 09/28/2015  . Paroxysmal atrial fibrillation ([facility])   .  Coronary atherosclerosis of native coronary artery 07/14/2013  . Mixed hyperlipidemia 07/14/2013  . Hypertensive heart disease without CHF     [place] Sigmund, Annye Rusk MS, CCC-SLP 01/28/2017, 12:33 PM  [facility] [address] [place], [place], [address] Phone: [phone]   Fax:  [phone]   Name: REVIN CORKER MRN: [id number] Date of Birth: 01-10-1929

## 2017-01-28 NOTE — Patient Instructions (Addendum)
   Cognitive Activities you can do at home:   - Solitaire  - Scrabble  - Chess/Checkers  - Crosswords (easy level)  - [place] daily schedule to include exercise, brain task,   Calendar with appointments on it, lunch/breakfast dates    Count coins, make change, sort and roll coins

## 2017-02-01 ENCOUNTER — Ambulatory Visit: Payer: Medicare Other

## 2017-02-01 DIAGNOSIS — R41841 Cognitive communication deficit: Secondary | ICD-10-CM

## 2017-02-01 NOTE — Therapy (Signed)
[facility] [address] [place], [place], [address] Phone: [phone]   Fax:  [phone]  Speech Language Pathology Treatment  Patient Details  Name: Jonathan [name] MRN: [id number] Date of Birth: 02/27/1929 Referring Provider: Dr. Loni Muse  Encounter Date: 02/01/2017      End of Session - 02/01/17 1631    Visit Number 3   Number of Visits 17   Date for SLP Re-Evaluation 03/19/17   SLP Start Time 1150   SLP Stop Time  4270   SLP Time Calculation (min) 46 min   Activity Tolerance Patient tolerated treatment well      Past Medical History:  Diagnosis Date  . Allergic rhinitis   . BPH (benign prostatic hypertrophy)   . Coronary atherosclerosis of native coronary artery   . Degeneration of lumbar or lumbosacral intervertebral disc   . Diabetes ([facility])    no meds  . Dyslipidemia   . HOH (hard of hearing)   . Hyperlipidemia   . Hypertension   . Right leg weakness   . S/P CABG x 4 2006   LIMA-LAD, SVG-rPDA, sSVG-OM1-OM2  . Stroke ([facility])   . Wears glasses    reading    Past Surgical History:  Procedure Laterality Date  . bil carpal tunnel surgery    . CARDIAC CATHETERIZATION N/A 10/01/2015   Procedure: Left Heart Cath and Cors/Grafts Angiography;  Surgeon: Peter M Martinique, MD;  Location: [facility];  Service: Cardiovascular;  Laterality: N/A;  . COLONOSCOPY    . CORONARY ARTERY BYPASS GRAFT  2006    Coronary artery bypass grafting x 4 (left internal mammary  . DUPUYTREN CONTRACTURE RELEASE Left 04/05/2014   Procedure: EXCISION DUPUYTREN LEFT PALM, RING, SMALL, AND THUMB;  Surgeon: Cammie Sickle, MD;  Location: [facility];  Service: Orthopedics;  Laterality: Left;  . EYE SURGERY     both cataracts  . HEMORRHOID SURGERY    . LAMINECTOMY    . ROTATOR CUFF REPAIR    . TONSILLECTOMY    . TRANSURETHRAL RESECTION OF PROSTATE      There were no vitals filed for this visit.       Subjective Assessment - 02/01/17 1158    Subjective "Went to do my walking."   Patient is accompained by: --  Son   Currently in Pain? No/denies               ADULT SLP TREATMENT - 02/01/17 1201      General Information   Behavior/Cognition Lethargic;Requires cueing;Alert     Cognitive-Linquistic Treatment   Treatment focused on Aphasia   Skilled Treatment SLP facilitated naming of simple items - appliances, fish, etc. Pt responded "I don't use those" or "I don't care about those" for much of divergent naming task. SLP explained that the familiarity of simple items does not coorelate with pt's ability to name/not name simple items prior to CVA and that this difference is directly due to CVA. Explained rationale for ST x3-4 during session to pt (and son).      Assessment / Recommendations / Plan   Plan Continue with current plan of care     Progression Toward Goals   Progression toward goals Progressing toward goals          SLP Education - 02/01/17 1628    Education provided Yes   Education Details nature of word finding difficulties, rationale for skilled ST, home tasks   Person(s) Educated Patient;Child(ren)  Methods Explanation;Demonstration;Verbal cues;Handout   Comprehension Verbal cues required;Need further instruction  pt will require further instruction          SLP Short Term Goals - 02/01/17 1637      SLP SHORT TERM GOAL #1   Title Pt will increase intellectual awareness of deficits by verbalizing 3 cognitive linguistic deficits with mod cues required   Time 3   Period Weeks   Status On-going     SLP SHORT TERM GOAL #2   Title Pt will identify effective strategies for improvement of functional recall with mod assist   Time 3   Period Weeks   Status On-going     SLP SHORT TERM GOAL #3   Title Pt will complete Level 1 naming tasks, naming 10 items per concrete category with mod assist.   Time 3   Period Weeks   Status On-going           SLP Long Term Goals - 02/01/17 1637      SLP LONG TERM GOAL #1   Title Pt will increase intellectual awareness of deficits by verbalizing 3 cognitive linguistic deficits and effective compensatory strategies with min cues required   Time 7   Period Weeks   Status On-going     SLP LONG TERM GOAL #2   Title Pt will identify effective strategies for improvement of functional recall with min assist, and report use of strategies across 3 sessions   Time 7   Period Weeks   Status New     SLP LONG TERM GOAL #3   Title Pt will complete Level 3 naming tasks, naming 5-10 items per abstract category with min assist.   Time 7   Period Weeks   Status On-going          Plan - 02/01/17 1631    Clinical Impression Statement Mr. Ask required ususal mod A for simple divergent naming task. Overall pt cont to appear apathetic to language deficits however told SLP that he has more difficulty with precise wording after his CVA. Pt often stated he didn't "have contact" or didn't "have any interest" in categories of divergent namgin today. Given this, it appears pt could have poor motivation to participate in any formal homework activites. SLP questions poor awareness vs. personality differences hindering overall progress in skilled ST. SLP suggested today that pt do the homework he feels would be helpful and SLP instructed son to engage in conversaiton requiring pt to describe steps, specific items, and procedures to focus on anomia/dysnomia. Continue skilled ST to maximize cognitive for improved QOL and independence.    Speech Therapy Frequency 2x / week   Duration --  8 weeks   Treatment/Interventions SLP instruction and feedback;Compensatory strategies;Functional tasks;Patient/family education;Multimodal communcation approach;Cognitive reorganization;Compensatory techniques   Potential to Achieve Goals Fair   Potential Considerations Ability to learn/carryover information;Cooperation/participation  level;Family/community support   Consulted and Agree with Plan of Care Patient;Family member/caregiver   Family Member Consulted son [name]      Patient will benefit from skilled therapeutic intervention in order to improve the following deficits and impairments:   Cognitive communication deficit    Problem List Patient Active Problem List   Diagnosis Date Noted  . Cognitive deficit, post-stroke 01/12/2017  . Aphasia, late effect of cerebrovascular disease 01/12/2017  . Right hemiparesis ([facility])   . Cognitive dysfunction   . Coronary artery disease involving coronary bypass graft of native heart without angina pectoris   . Expressive aphasia 10/30/2016  .  Thalamic infarct, acute ([facility]) 10/30/2016  . Cerebral thrombosis with cerebral infarction 10/29/2016  . Acute CVA (cerebrovascular accident) ([facility]) 10/29/2016  . Syncope 10/28/2016  . Long-term (current) use of anticoagulants 10/17/2015  . Typical atrial flutter ([facility])   . Diabetes mellitus ([facility]) 09/28/2015  . Paroxysmal atrial fibrillation ([facility])   . Coronary atherosclerosis of native coronary artery 07/14/2013  . Mixed hyperlipidemia 07/14/2013  . Hypertensive heart disease without CHF     [facility] ,MS, CCC-SLP  02/01/2017, 4:39 PM  [facility] [address] [place], [place], [address] Phone: [phone]   Fax:  [phone]   Name: Jonathan [name] MRN: [id number] Date of Birth: Oct 24, 1928

## 2017-02-01 NOTE — Patient Instructions (Signed)
  Please complete the assigned speech therapy homework prior to your next session and return it to the speech therapist at your next visit. Please do the exercises you feel are going to be helpful for you to do. Have conversation with your family, focusing on description of things and more detailed, exact, and precise language. You said today that those kinds of words are more difficult for you after your stroke.

## 2017-02-02 ENCOUNTER — Encounter (HOSPITAL_BASED_OUTPATIENT_CLINIC_OR_DEPARTMENT_OTHER): Payer: Medicare Other | Admitting: Psychology

## 2017-02-02 DIAGNOSIS — I69319 Unspecified symptoms and signs involving cognitive functions following cerebral infarction: Secondary | ICD-10-CM | POA: Diagnosis not present

## 2017-02-02 DIAGNOSIS — F4323 Adjustment disorder with mixed anxiety and depressed mood: Secondary | ICD-10-CM | POA: Diagnosis not present

## 2017-02-02 NOTE — Progress Notes (Signed)
Continue to work on Therapist, occupational and strategies around residual effects of a stroke. The patient remained resistant to engage in all of the therapeutic interventions in activities that are being requested of him. We worked on this issue as well as discussing some of these issues with his son and wife.    Patient:  Jonathan [name]   DOB: 1929-06-08  MR Number: [id number]  Location: [facility] [address], [place] [address] Dept: [phone]  Start: 1 PM End: 3 PM  Provider/Observer:     Edgardo Roys PSYD  Chief Complaint:      Chief Complaint  Patient presents with  . Memory Loss  . Agitation    Reason For Service:     The patient was referred by Dr. Letta Pate for psychological/neuropsychological interventions. The patient vascular event in January 2018 where he presented with confusion. 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 The patient has had adjustment difficulties after his hospitalization and is reluctant to go through some of the rehabilitation efforts. He has continued to have some confusion which have played a role in his reluctance. There was a referral for psychological interventions to help him better understand how his potential underlying anxiety and frustration with what has been going on with him is negatively impacting his follow-through in some of the rehabilitation in speech therapy efforts. The patient had a stroke that essentially was described as a thalamic infarct on January 17. The patient has been frustrated about what is happening with him and describes worry and anxiety around his inability to improve and is not very cooperative with the therapist he has been working with.  Testing Administered:  RBANS Form A and WAIS-III  Participation Level:   Active  Participation  Quality:  Appropriate      Behavioral Observation:  Well Groomed, Alert, and Blunt.   Test Results:    DSM-IV Criteria for Dementia A. Development of multiple cognitive deficits: Marland Kitchen Memoryimpairment, plus . One or more Cognitive Disturbances: - Aphasia: disorder of language - Apraxia: impaired motor activities - Agnosia: inability to recognize or identify objects - Dysexecutive: defect defective initiation, planning, organization/abstraction B. Cognitive deficits cause significant impairment in social/occupational functioning and are a decline from previous level of functioning  Mild Cognitive Impairment . Patients who are memory impaired but are otherwise functioning well and do not meet clinical criteria for dementia are classified as having MCI . Symptoms include - Memory complaint, preferably with corroboration - Objective memory impairment - Normal general cognitive function - Intact activities of daily living - Not demented . Patients with MCI should be recognized and monitored for cognitive and functional decline due to their increased risk for subsequent dementia   Qualitative Description of RBANS Index Scores      Classification Descriptors for Subtest Scaled Scores Above 16 very superior       Total Index Score:  71  The patient's performance falls in the borderline range of functioning with regard to his composite score of 71. However, there was tremendous variability within subtest performance and therefore there were clearly areas where he was functioning quite well relative to normative comparison group while other areas he was doing very poorly. Visual-spatial performance and initial auditory encoding were both either in the average range or superior range of functioning however, the patient had significant difficulties with immediate memory and  delayed memory as well as verbal fluency subtest.  Total Index Score . Composite of all indexes within the  battery. Kermit Balo indicator of general cognitive functioning . Low scores strongly suggest general cognitive impairment even when some individual subtest scores may be within normal limits. . Individuals with low scores on this measure exhibit problems with attention memory language and constructional skills attention, memory, language, skills. . Scores in low average to borderline range indicate either general lowering of skills across domains or variability in cognitive functioning with some lower and higher scores.     Immediate Memory Index:  44  The patient's performance on his immediate memory index was in the extremely low range. The patient displayed significant impairments with regard to learning a list of common words. The patient also had great difficulty with regard to short-term storage of information for story learning memory.     Immediate Memory Index . Measure of initial encoding and learning of complex and simple verbal information . Low scores indicate difficulties with verbal learning . Low average or borderline scores on this index can represent general low average verbal memory functioning or may occur when there is variability in the subtests that comprise the index. . Related to ability to self-medicate, manage finances, remember appointments, cook, drive . Impacts all other areas       Visuospatial / Constructional Index:  126  The patient displayed significant skill with regard to his visual-spatial and constructional abilities. While his was able to effectively he did exceptionally well on the line orientation test. This really looks at his visual spatial abilities patient did quite well. This is likely indicative of pre-existing skill in these areas but also the fact that none of these areas of abilities of been affected by stroke.   Visuospatial / Constructional Index . Measures basic visuospatial perception and ability to copy a design from a model. .  Low scores indicate difficulties with processing and using visuospatial information. And may also occur in examinees with severe visual impairments or attention disorders such as hemineglect. . Low average or borderline scores can represent general low average visuospatial functioning or may occur when there is variability in the subtests that comprise the index. Impacts visuospatial processing . Impacts driving, food preparation, use of tools       Attention Index:  91  The patient's performance on the attentional index falls at the lower end of the average range. The patient did exceptionally well with regard to his auditory encoding abilities but did have some difficulties with his overall speed of mental operations and focus execute abilities is significant split suggest that the patient's overall speed of mental operations and processing or problematic for him but that he continues to have a exceptional ability with regard to his auditory encoding, which is a prerequisite for memory and learning. Therefore his difficulties in this area do with speed of information processing with exceptional abilities for auditory encoding.    Attention Index . Measure of simple auditory registration and visual scanning and processing speed. . Low scores indicate difficulties with basic attention processes and speed of information processing. . Low average or borderline scores can represent general low average attention and processing speed or may occur when there is variability in the subtests that comprise the index. . Affects all ADLs      Language Indes:  76  The patient's performance on language subtest falls in the borderline range of functioning. This is a moderate level of impairment. The  patient had moderate deficits with regard to naming particular objects but had significant deficits with regard to verbal fluency abilities. There were no indications of receptive language deficits but his  limitations has to do with verbal fluency and not with specific expressive language deficits.  Language Index . Measure of expressive language functioning. . Low scores indicate difficulties with fluent use of language. . Primarily indicative of expressive language functioning, but individuals with receptive language impairments will also have low scores on this measure. Marland Kitchen Speech Comprehension (Receptive Speech) - Ability to respond to questions - Ability to react appropriately to comments - Ability to respond to instructions for simple tests . Expressive speech - Fluency - Articulation - Prosody - Naming - Repetition    Delayed Memory Index:  48  The patient's performance on this measure falls in the significantly were severely impaired range. The patient had severe deficits with regard to recall information after period of delay. However, his performance was better relative to normative expectations with regard to delayed memory versus immediate memory. This pattern suggests that if information is transferred from encoding function to short-term memory that it is available for recall after period of delay. This clearly suggest that the primary deficit has to do with transferring information from encoding storage to short-term memory stores. If information is initially learned it is generally available for recall that a later time.  Delayed Memory Index . Measure of delayed recall and recognition for verbal and visual information. . Low scores indicate difficulties with recognition and retrieval of information from long-term memory stores. . Impacts all areas.   GENERAL INTELLECTUAL FUNCTIONING:  The patient's performance on the Weschler Adult Intellectual Scale-III, classifies his current intellectual abilities to be in the low average Range, with a Full Scale IQ score of 84, a Verbal IQ score of 79, and a Performance IQ score of 92.  Overall, his performance places his at the 14th  percentile with regard to his age-related peer group.  Analysis on Index Scores produced a Verbal Comprehension Index Score of 74, a Perceptual Organization Index Score of 93, a Working Memory Index Score of 94, and a Processing Speed Index Score of 79.   Overall, WAIS-III performance suggest that the patient's VIQ vs. PIQ were indicative of some significant differences in variability in subtest performance. The patient showed some considerable variability between his verbal and performance global areas of functioning with his visual-spatial abilities to be quite good. The patient showed strength with regard to his visual spatial and perceptual organizational abilities as well as his auditory/short-term encoding abilities. The patient had some significant difficulties with verbal comprehension and verbal reasoning abilities as well as his overall speed of mental operations.     Below are the Age-Adjusted scores for each of the individual WAIS-III subtests.  Vocabulary:    5 Similarities:    4 Arithmetic:    8 Digit Span:    11 Information:    7 Comprehension:   5 Letter Number Sequencing:  9  Picture Completion:   9 Digit Symbol:    8 Block Design:    8 Matrix Reasoning:   10 Picture Arrangement:   10 Symbol Search:   4  WAIS-III analysis reveals a VIQ score of 79, which places his at the eighth percentile.  Significant scatter was noted in subtest performance.  The patient displayed no significant strength relative to his age matched your group.  he performed in the average range on measures of auditory encoding with straight  encoding being an exceptional strength and if there were any multiprocessing demands his performance deteriorated rapidly.  The patient also performed in the average range regard to his mathematical abilities attention was expected. The patient displayed moderately impaired performance on measures of his vocabulary knowledge, verbal reasoning abilities, general fund of  information, and social judgment comprehension abilities.   Clearly, the patient's ability to pull out general information that he should've learned in life as well as his social judgment, comprehension and verbal reasoning abilities are all impaired and likely indicative of significant loss of function from pre-existing abilities.    WAIS-III analysis reveals a PIQ score of 92, which places him at the 30th percentile. Significant scatter was seen on these measures.   Average performance was noted on his ability to develop and understand visual gestalts, visual searching and sequencing, visual analysis and organization, visual reasoning and visual sequencing.  THe patient had significant impairments on measures of visual scanning speed and general speed of mental operations.    Overall, WAIS-III performance was in the Low Average range.  This pattern of strengths and weaknesses is globally consistent with the impact of his recent stroke.   The patients performance on various subtests suggest that there has been a significant loss of function in the verbal areas of vocabulary, verbal reasoning, social judgment and comprehension and speed of mental operations.  Other areas appear to remain intact.  These include mathematical abilities, auditory encoding, visual spatial abilities and visual reasoning.      Summary of Results:   Results of the current neuropsychological evaluation are consistent with significant memory deficits, reduction and verbal fluency and efficiency, reduction overall speed of mental operations, a loss of basic vocabulary, impairments with regard to verbal reasoning and problem-solving, a loss and social judgment comprehension abilities. However, the patient shows continued strength with regard to his visual-spatial and perceptual organizational abilities, visual reasoning and problem-solving, and visual scanning abilities. The memory deficits are directly related to the transfer of  information from an effective encoding/inital storage to short term memory stores.  The limited information that is getting into short term memory is being stored in a way that allows for later retrieval.  These are consistent with the effects of the recent stroke. They're not consistent with other forms of dementia that would be degenerative or progressive in nature and unless there are further strokes we are likely not to see any significant loss of functioning outside of the effects of aging this pattern of strengths and weaknesses is clearly consistent with a focal lesioning reduced by cerebrovascular accident/stroke.  Recommendations:   Given the timeline and the acute nature of his recent stroke, I would highly recommend the patient actively participate in any therapeutic efforts such as speech therapy, which is also helping him in other cognitive areas. The patient is reported to not be physically active at home and this will be very important for him to begin to increase his physical activity. The patient is not delusional and has memory capacity to avoid danger if left alone left alone. His judgement appears intact but his motivation is severely impaired.  His deficits have a lot to do with impairments in expressive language.  Mostly the impairments in his internal self talk that he has utilized in the past for motivation organizing his thinking. This confused internal self language/talk is likely playing a role in lack of motivation and initiative. I would highly recommend assist from family members to help him engaged in his  possible.  Diagnosis:    Axis I: Cognitive deficit, post-stroke  Adjustment disorder with mixed anxiety and depressed mood   Ilean Skill, Psy.D. Neuropsychologist

## 2017-02-05 ENCOUNTER — Ambulatory Visit: Payer: Medicare Other | Admitting: *Deleted

## 2017-02-05 DIAGNOSIS — R41841 Cognitive communication deficit: Secondary | ICD-10-CM | POA: Diagnosis not present

## 2017-02-05 NOTE — Patient Instructions (Signed)
   Things to do in the yard  1. Cut trees down - identify trees that need to be cut down 2. Trim trees -  3. Clean up mess from the weather - pick up sticks 4. Make pool area neat 5. Cut grass 6.Trim bushes   Remembering things  1. Write things down 2. Identify a consistent place to write things down - calendar, white board, cork board 3. Write down "what you used to do" when you think of it

## 2017-02-05 NOTE — Therapy (Signed)
[facility] [address] [place], [place], [address] Phone: [phone]   Fax:  [phone]  Speech Language Pathology Treatment  Patient Details  Name: Jonathan [name] MRN: [id number] Date of Birth: [birth year] Referring Provider: Dr. Alysia Penna  Encounter Date: 02/05/2017      End of Session - 02/05/17 1109    Visit Number 4   Number of Visits 17   Date for SLP Re-Evaluation 03/19/17   SLP Start Time 7654   SLP Stop Time  1100   SLP Time Calculation (min) 45 min   Activity Tolerance Patient tolerated treatment well      Past Medical History:  Diagnosis Date  . Allergic rhinitis   . BPH (benign prostatic hypertrophy)   . Coronary atherosclerosis of native coronary artery   . Degeneration of lumbar or lumbosacral intervertebral disc   . Diabetes ([place])    no meds  . Dyslipidemia   . HOH (hard of hearing)   . Hyperlipidemia   . Hypertension   . Right leg weakness   . S/P CABG x 4 2006   LIMA-LAD, SVG-rPDA, sSVG-OM1-OM2  . Stroke ([place])   . Wears glasses    reading    Past Surgical History:  Procedure Laterality Date  . bil carpal tunnel surgery    . CARDIAC CATHETERIZATION N/A 10/01/2015   Procedure: Left Heart Cath and Cors/Grafts Angiography;  Surgeon: Peter M Martinique, MD;  Location: [facility];  Service: Cardiovascular;  Laterality: N/A;  . COLONOSCOPY    . CORONARY ARTERY BYPASS GRAFT  2006    Coronary artery bypass grafting x 4 (left internal mammary  . DUPUYTREN CONTRACTURE RELEASE Left 04/05/2014   Procedure: EXCISION DUPUYTREN LEFT PALM, RING, SMALL, AND THUMB;  Surgeon: Cammie Sickle, MD;  Location: [facility];  Service: Orthopedics;  Laterality: Left;  . EYE SURGERY     both cataracts  . HEMORRHOID SURGERY    . LAMINECTOMY    . ROTATOR CUFF REPAIR    . TONSILLECTOMY    . TRANSURETHRAL RESECTION OF PROSTATE      There were no vitals filed for this  visit.      Subjective Assessment - 02/05/17 1019    Subjective That noise (hum in his head) is a terrible bother   Patient is accompained by: Family member  daughter in law   Currently in Pain? Yes   Pain Score 5    Pain Location Head               ADULT SLP TREATMENT - 02/05/17 0001      General Information   Behavior/Cognition Alert     Pain Assessment   Pain Assessment 0-10   Pain Score 5    Pain Location "hum" in his head   Pain Intervention(s) Monitored during session     Cognitive-Linquistic Treatment   Treatment focused on Cognition;Aphasia;Patient/family/caregiver education   Skilled Treatment SLP facilitated discussion about tasks pt would like to do at home; SLP made list of yard activities pt could do (rather than sitting staring at the wall). Pt reported not being able to remember what he used to do, but is unable to verbalize specifics. He was encouraged to write these things down, after identifying a consistent place where written reminders are placed.  Pt is challenging, and requires frequent redirection to stay on topic, or move on from topic (hum in his head, head not working like it should). Pt  tends to be dismissive during therapy. Question pt benefit of services at this time, given poor participation and carryover.     Assessment / Recommendations / Plan   Plan Continue with current plan of care     Progression Toward Goals   Progression toward goals Progressing toward goals          SLP Education - 02/05/17 1108    Education provided Yes   Education Details improving functional recall, "things to do" at home   Person(s) Educated Patient;Child(ren)  daughter in law   Methods Explanation;Demonstration;Handout;Verbal cues   Comprehension Verbalized understanding;Verbal cues required;Need further instruction          SLP Short Term Goals - 02/05/17 1126      SLP SHORT TERM GOAL #1   Title Pt will increase intellectual awareness of  deficits by verbalizing 3 cognitive linguistic deficits with mod cues required   Time 3   Period Weeks   Status On-going     SLP SHORT TERM GOAL #2   Title Pt will identify effective strategies for improvement of functional recall with mod assist   Time 3   Period Weeks   Status On-going     SLP SHORT TERM GOAL #3   Title Pt will complete Level 1 naming tasks, naming 10 items per concrete category with mod assist.   Time 3   Period Weeks   Status On-going          SLP Long Term Goals - 02/05/17 1126      SLP LONG TERM GOAL #1   Title Pt will increase intellectual awareness of deficits by verbalizing 3 cognitive linguistic deficits and effective compensatory strategies with min cues required   Time 7   Period Weeks   Status On-going     SLP LONG TERM GOAL #2   Title Pt will identify effective strategies for improvement of functional recall with min assist, and report use of strategies across 3 sessions   Time 7   Period Weeks   Status On-going     SLP LONG TERM GOAL #3   Title Pt will complete Level 3 naming tasks, naming 5-10 items per abstract category with min assist.   Time 7   Period Weeks   Status On-going          Plan - 02/05/17 1109    Clinical Impression Statement Pt continues to be challenging, perseverating on the hum in his head, requiring ongoing cues to move on from this topic, following encouragement by SLP to make an appointment with ENT (hum is apparently positional - stopping when he lies down), or trying ambient noise (hum is less noticable in a restauant - suggested white noise, ocean waves, fan noise, etc). SLP attempted to engage pt in discussion of activities he enjoys, however, pt returns to vague comments about what he can't do, and the fact that "the hum in his head is a bother".  Unfortunately, if pt participation in therapy does not change significantly, further sessions will not be beneficial. Will continue per POC, as pt would benefit from  skilled ST intervention for improvement of cognitive/linguistic skills for maximized independence and safety.    Speech Therapy Frequency 2x / week   Duration --  8 weeks   Treatment/Interventions SLP instruction and feedback;Compensatory strategies;Functional tasks;Patient/family education;Multimodal communcation approach;Cognitive reorganization;Compensatory techniques   Potential to Achieve Goals Fair   Potential Considerations Ability to learn/carryover information;Cooperation/participation level;Family/community support   Consulted and Agree with Plan of Care  Patient;Family member/caregiver   Family Member Consulted daughter in law      Patient will benefit from skilled therapeutic intervention in order to improve the following deficits and impairments:   Cognitive communication deficit    Problem List Patient Active Problem List   Diagnosis Date Noted  . Cognitive deficit, post-stroke 01/12/2017  . Aphasia, late effect of cerebrovascular disease 01/12/2017  . Right hemiparesis ([place])   . Cognitive dysfunction   . Coronary artery disease involving coronary bypass graft of native heart without angina pectoris   . Expressive aphasia 10/30/2016  . Thalamic infarct, acute ([place]) 10/30/2016  . Cerebral thrombosis with cerebral infarction 10/29/2016  . Acute CVA (cerebrovascular accident) ([facility]) 10/29/2016  . Syncope 10/28/2016  . Long-term (current) use of anticoagulants 10/17/2015  . Typical atrial flutter ([place])   . Diabetes mellitus ([place]) 09/28/2015  . Paroxysmal atrial fibrillation ([facility])   . Coronary atherosclerosis of native coronary artery 07/14/2013  . Mixed hyperlipidemia 07/14/2013  . Hypertensive heart disease without CHF    Ed Rayson B. [place], MSP, CCC-SLP  Shonna Chock 02/05/2017, 11:27 AM  [facility] [address] [place], [place], [address] Phone: [phone]   Fax:  [phone]   Name: Jonathan [name] MRN: [id number] Date of Birth: 1929/08/18

## 2017-02-09 ENCOUNTER — Ambulatory Visit: Payer: Medicare Other | Attending: Physical Medicine & Rehabilitation | Admitting: *Deleted

## 2017-02-09 DIAGNOSIS — R41841 Cognitive communication deficit: Secondary | ICD-10-CM | POA: Diagnosis not present

## 2017-02-09 NOTE — Therapy (Signed)
[facility] [address] [place], [place], [address] Phone: [phone]   Fax:  [phone]  Speech Language Pathology Treatment  Patient Details  Name: Jonathan [name] MRN: [id number] Date of Birth: 02-Jun-1929 Referring Provider: Dr. Loni Muse  Encounter Date: 02/09/2017      End of Session - 02/09/17 1233    Visit Number 5   Number of Visits 5   SLP Start Time 3244   SLP Stop Time  1100   SLP Time Calculation (min) 37 min   Activity Tolerance Patient tolerated treatment well      Past Medical History:  Diagnosis Date  . Allergic rhinitis   . BPH (benign prostatic hypertrophy)   . Coronary atherosclerosis of native coronary artery   . Degeneration of lumbar or lumbosacral intervertebral disc   . Diabetes ([place])    no meds  . Dyslipidemia   . HOH (hard of hearing)   . Hyperlipidemia   . Hypertension   . Right leg weakness   . S/P CABG x 4 2006   LIMA-LAD, SVG-rPDA, sSVG-OM1-OM2  . Stroke ([place])   . Wears glasses    reading    Past Surgical History:  Procedure Laterality Date  . bil carpal tunnel surgery    . CARDIAC CATHETERIZATION N/A 10/01/2015   Procedure: Left Heart Cath and Cors/Grafts Angiography;  Surgeon: Peter M Martinique, MD;  Location: [facility];  Service: Cardiovascular;  Laterality: N/A;  . COLONOSCOPY    . CORONARY ARTERY BYPASS GRAFT  2006    Coronary artery bypass grafting x 4 (left internal mammary  . DUPUYTREN CONTRACTURE RELEASE Left 04/05/2014   Procedure: EXCISION DUPUYTREN LEFT PALM, RING, SMALL, AND THUMB;  Surgeon: Cammie Sickle, MD;  Location: [facility];  Service: Orthopedics;  Laterality: Left;  . EYE SURGERY     both cataracts  . HEMORRHOID SURGERY    . LAMINECTOMY    . ROTATOR CUFF REPAIR    . TONSILLECTOMY    . TRANSURETHRAL RESECTION OF PROSTATE      There were no vitals filed for this visit.      Subjective Assessment - 02/09/17 1023    Subjective Pt continues resistant to therapy and carryover activities, and son reports he "shuts down" more quickly with him at home.   Patient is accompained by: Family member  son   Currently in Pain? No/denies               ADULT SLP TREATMENT - 02/09/17 0001      General Information   Behavior/Cognition Alert     Treatment Provided   Treatment provided Cognitive-Linquistic     Pain Assessment   Pain Assessment No/denies pain     Cognitive-Linquistic Treatment   Treatment focused on Cognition;Aphasia;Patient/family/caregiver education   Skilled Treatment SLP facilitated session regarding pt apathy and resistance to therapy activities during sessions and as carryover. Pt frequently interrupts the speaker (whether SLP or son), and maintains the therapy is not helping him. After lengthy discussion with pt. son and SLP, decision was made to discontinue therapy until such time as pt is more motivated and willing to participate. [name] was provided with resources and worksheets he can try to use at home. OP services remain available if pt changes his mind and decides he wants to participate in therapy in the future.     Assessment / Recommendations / Plan   Plan Discharge SLP treatment due to lack of  interest, motivation, and progress     Progression Toward Goals   Progression toward goals Not progressing toward goals - minimal participation in session, and refusal to complete home tasks.          SLP Education - 03-10-2017 1232    Education provided Yes   Education Details continue to encourage use of strategies to improve functional recall, thought organization.   Person(s) Educated Patient;Child son   Methods Explanation;Demonstration;Verbal cues   Comprehension Verbalized understanding;Verbal cues required      SPEECH THERAPY DISCHARGE SUMMARY  Visits from Start of Care: 5  Current functional level related to goals / functional outcomes: Pt continues to exhibit  cognitive/communication impairment, largely due to pt resistance to participation in therapy and at home.   Remaining deficits: Pt continues to demonstrate deficits in functional recall, attention, functional problem solving and executive functions   Education / Equipment: Education completed on an ongoing basis. A family member was always present during therapy.   Plan: Patient agrees to discharge.  Patient goals were not met. Patient is being discharged due to lack of progress.  ?????          SLP Short Term Goals - 03/10/17 1237      SLP SHORT TERM GOAL #1   Title Pt will increase intellectual awareness of deficits by verbalizing 3 cognitive linguistic deficits with mod cues required   Status Not Met     SLP SHORT TERM GOAL #2   Title Pt will identify effective strategies for improvement of functional recall with mod assist   Status Not Met     SLP SHORT TERM GOAL #3   Title Pt will complete Level 1 naming tasks, naming 10 items per concrete category with mod assist.   Status Not Met          SLP Long Term Goals - 03/10/2017 1237      SLP LONG TERM GOAL #1   Title Pt will increase intellectual awareness of deficits by verbalizing 3 cognitive linguistic deficits and effective compensatory strategies with min cues required   Status Not Met     SLP LONG TERM GOAL #2   Title Pt will identify effective strategies for improvement of functional recall with min assist, and report use of strategies across 3 sessions   Status Not Met     SLP LONG TERM GOAL #3   Title Pt will complete Level 3 naming tasks, naming 5-10 items per abstract category with min assist.   Status Not Met          Plan - 10-Mar-2017 1234    Clinical Impression Statement Decision was made with pt and son to discontinue therapy at this time. Pt has continued to exhibit poor motivation, poor carryover, and poor participation in therapy activities and home tasks, verbalizing that "nothing is helping". Pt  is not benefitting from therapy, and "shuts down" (son's description) quickly in therapy, and even more quickly at home, increasing frustration and increasing resistance. At such time that pt decides he wants to participate in therapy, OP services are available.    Consulted and Agree with Plan of Care Patient;Family member/caregiver   Family Member Consulted son      Patient will benefit from skilled therapeutic intervention in order to improve the following deficits and impairments:   Cognitive communication deficit      G-Codes - 03-10-17 1238    Functional Assessment Tool Used asha noms, clinical judgment, MoCA   Functional Limitations Memory  Memory Current Status [phone]) At least 40 percent but less than 60 percent impaired, limited or restricted   Memory Goal Status (Q6761) At least 40 percent but less than 60 percent impaired, limited or restricted   Memory Discharge Status (G9170) At least 40 percent but less than 60 percent impaired, limited or restricted      Problem List Patient Active Problem List   Diagnosis Date Noted  . Cognitive deficit, post-stroke 01/12/2017  . Aphasia, late effect of cerebrovascular disease 01/12/2017  . Right hemiparesis ([place])   . Cognitive dysfunction   . Coronary artery disease involving coronary bypass graft of native heart without angina pectoris   . Expressive aphasia 10/30/2016  . Thalamic infarct, acute ([place]) 10/30/2016  . Cerebral thrombosis with cerebral infarction 10/29/2016  . Acute CVA (cerebrovascular accident) ([place]) 10/29/2016  . Syncope 10/28/2016  . Long-term (current) use of anticoagulants 10/17/2015  . Typical atrial flutter ([place])   . Diabetes mellitus ([place]) 09/28/2015  . Paroxysmal atrial fibrillation ([facility])   . Coronary atherosclerosis of native coronary artery 07/14/2013  . Mixed hyperlipidemia 07/14/2013  . Hypertensive heart disease without CHF    Ranesha Val B. [place], MSP, CCC-SLP  Shonna Chock 02/09/2017, 12:39  PM  [facility] [address] [place], [place], [address] Phone: [phone]   Fax:  [phone]   Name: Jonathan [name] MRN: [id number] Date of Birth: September 19, 1929

## 2017-02-12 ENCOUNTER — Encounter: Payer: Medicare Other | Admitting: *Deleted

## 2017-02-16 ENCOUNTER — Encounter: Payer: Medicare Other | Admitting: *Deleted

## 2017-02-16 ENCOUNTER — Telehealth: Payer: Self-pay | Admitting: *Deleted

## 2017-02-16 NOTE — Telephone Encounter (Signed)
Headache is not alarming, fairly common after this type of stroke. Left shoulder pain is also not alarming may be related to overhead activities, I would need to check on this in clinic

## 2017-02-16 NOTE — Telephone Encounter (Signed)
Patient's son called and left a message.  The patient has developed a bad headache over the last 2 days plus pain in the left shoulder. He is asking if this is alarming? Please advise

## 2017-02-17 NOTE — Telephone Encounter (Signed)
Returned son's phone. He has been notified of Dr. Letta Pate note. Son states he would like Dr. Letta Pate first Avail. Appointment. Patient has been scheduled for 02/18/2017 with Dr. Letta Pate

## 2017-02-18 ENCOUNTER — Telehealth: Payer: Self-pay | Admitting: *Deleted

## 2017-02-18 ENCOUNTER — Ambulatory Visit (HOSPITAL_BASED_OUTPATIENT_CLINIC_OR_DEPARTMENT_OTHER): Payer: Medicare Other | Admitting: Physical Medicine & Rehabilitation

## 2017-02-18 ENCOUNTER — Encounter: Payer: Self-pay | Admitting: Physical Medicine & Rehabilitation

## 2017-02-18 ENCOUNTER — Encounter: Payer: Medicare Other | Attending: Psychology

## 2017-02-18 VITALS — BP 160/89 | HR 87

## 2017-02-18 DIAGNOSIS — I69319 Unspecified symptoms and signs involving cognitive functions following cerebral infarction: Secondary | ICD-10-CM | POA: Diagnosis not present

## 2017-02-18 DIAGNOSIS — R413 Other amnesia: Secondary | ICD-10-CM | POA: Diagnosis present

## 2017-02-18 DIAGNOSIS — F4323 Adjustment disorder with mixed anxiety and depressed mood: Secondary | ICD-10-CM | POA: Diagnosis not present

## 2017-02-18 DIAGNOSIS — Z8249 Family history of ischemic heart disease and other diseases of the circulatory system: Secondary | ICD-10-CM | POA: Diagnosis not present

## 2017-02-18 DIAGNOSIS — Z7984 Long term (current) use of oral hypoglycemic drugs: Secondary | ICD-10-CM | POA: Diagnosis not present

## 2017-02-18 DIAGNOSIS — I2510 Atherosclerotic heart disease of native coronary artery without angina pectoris: Secondary | ICD-10-CM | POA: Insufficient documentation

## 2017-02-18 DIAGNOSIS — Z7901 Long term (current) use of anticoagulants: Secondary | ICD-10-CM | POA: Diagnosis not present

## 2017-02-18 DIAGNOSIS — Z951 Presence of aortocoronary bypass graft: Secondary | ICD-10-CM | POA: Insufficient documentation

## 2017-02-18 DIAGNOSIS — I10 Essential (primary) hypertension: Secondary | ICD-10-CM | POA: Insufficient documentation

## 2017-02-18 DIAGNOSIS — E119 Type 2 diabetes mellitus without complications: Secondary | ICD-10-CM | POA: Insufficient documentation

## 2017-02-18 DIAGNOSIS — Z8673 Personal history of transient ischemic attack (TIA), and cerebral infarction without residual deficits: Secondary | ICD-10-CM | POA: Diagnosis not present

## 2017-02-18 DIAGNOSIS — Z79899 Other long term (current) drug therapy: Secondary | ICD-10-CM | POA: Insufficient documentation

## 2017-02-18 DIAGNOSIS — E785 Hyperlipidemia, unspecified: Secondary | ICD-10-CM | POA: Diagnosis not present

## 2017-02-18 NOTE — Progress Notes (Signed)
Subjective:    Patient ID: Jonathan [name], male    DOB: 1928-12-15, 83 y.o.   MRN: [id number] Left thalamic infarct 10/28/2016 , completed inpatient rehabilitation [facility] HPI Pt  "not doing anything'  Headache increasing and some dizziness, feels better when in bed.  "noise in head" worse with being up,This noise improves after laying down for at least 10 minutes  Pt goes out to restaurant and it gets better, but a white noise generator does not seem to help with this.  Past hx of tinnitus has seen Dr. Rexene Alberts from neurology, workup included MRI as well as EEG. This was 02/13/2016. Subsequently, he did have the thalamic infarct. Patient has been seeing neuropsychology, and cognitive deficits were noted on neuropsychologic testing which have been attributed to CVA Pain Inventory Average Pain 5 Pain Right Now 5 My pain is intermittent  In the last 24 hours, has pain interfered with the following? General activity 8 Relation with others 8 Enjoyment of life 9 What TIME of day is your pain at its worst? . Sleep (in general) Fair  Pain is worse with: walking, sitting and standing Pain improves with: rest Relief from Meds: 0  Mobility walk without assistance ability to climb steps?  yes do you drive?  no  Function retired  Neuro/Psych weakness trouble walking confusion depression  Prior Studies Any changes since last visit?  no  Physicians involved in your care Any changes since last visit?  no   Family History  Problem Relation Age of Onset  . Hypertension Father    Social History   Social History  . Marital status: Married    Spouse name: N/A  . Number of children: 2  . Years of education: HS   Occupational History  . Retired    Social History Main Topics  . Smoking status: Former Research scientist (life sciences)  . Smokeless tobacco: Never Used     Comment: Quit 1958  . Alcohol use No  . Drug use: No  . Sexual activity: Not on file   Other Topics Concern  . Not on  file   Social History Narrative   Retired ,Scientist, clinical (histocompatibility and immunogenetics), married .   Past Surgical History:  Procedure Laterality Date  . bil carpal tunnel surgery    . CARDIAC CATHETERIZATION N/A 10/01/2015   Procedure: Left Heart Cath and Cors/Grafts Angiography;  Surgeon: Peter M Martinique, MD;  Location: [facility];  Service: Cardiovascular;  Laterality: N/A;  . COLONOSCOPY    . CORONARY ARTERY BYPASS GRAFT  2006    Coronary artery bypass grafting x 4 (left internal mammary  . DUPUYTREN CONTRACTURE RELEASE Left 04/05/2014   Procedure: EXCISION DUPUYTREN LEFT PALM, RING, SMALL, AND THUMB;  Surgeon: Cammie Sickle, MD;  Location: [facility];  Service: Orthopedics;  Laterality: Left;  . EYE SURGERY     both cataracts  . HEMORRHOID SURGERY    . LAMINECTOMY    . ROTATOR CUFF REPAIR    . TONSILLECTOMY    . TRANSURETHRAL RESECTION OF PROSTATE     Past Medical History:  Diagnosis Date  . Allergic rhinitis   . BPH (benign prostatic hypertrophy)   . Coronary atherosclerosis of native coronary artery   . Degeneration of lumbar or lumbosacral intervertebral disc   . Diabetes ([place])    no meds  . Dyslipidemia   . HOH (hard of hearing)   . Hyperlipidemia   . Hypertension   . Right leg weakness   .  S/P CABG x 4 2006   LIMA-LAD, SVG-rPDA, sSVG-OM1-OM2  . Stroke ([place])   . Wears glasses    reading   There were no vitals taken for this visit.  Opioid Risk Score:   Fall Risk Score:  `1  Depression screen PHQ 2/9  Depression screen PHQ 2/9 11/13/2016  Decreased Interest 3  Down, Depressed, Hopeless 2  PHQ - 2 Score 5  Altered sleeping 1  Tired, decreased energy 0  Change in appetite 2  Feeling bad or failure about yourself  0  Trouble concentrating 3  Moving slowly or fidgety/restless 2  Suicidal thoughts 0  PHQ-9 Score 13  Difficult doing work/chores Somewhat difficult    Review of Systems  Constitutional: Negative.   HENT: Negative.   Eyes:  Negative.   Respiratory: Negative.   Cardiovascular: Negative.   Gastrointestinal: Negative.   Endocrine: Negative.   Genitourinary: Negative.   Musculoskeletal: Negative.   Skin: Negative.   Allergic/Immunologic: Negative.   Neurological: Negative.   Hematological: Negative.   Psychiatric/Behavioral: Negative.   All other systems reviewed and are negative.      Objective:   Physical Exam  Constitutional: He is oriented to person, place, and time. He appears well-developed and well-nourished.  HENT:  Head: Normocephalic and atraumatic.  Eyes: Conjunctivae and EOM are normal. Pupils are equal, round, and reactive to light. Right eye exhibits no discharge. Left eye exhibits no discharge. No scleral icterus.  No evidence of nystagmus  Neck: Normal range of motion. Neck supple. No JVD present.  Cardiovascular: Normal rate, regular rhythm and normal heart sounds.  Exam reveals no friction rub.   No murmur heard. Pulmonary/Chest: Effort normal and breath sounds normal. No stridor. No respiratory distress. He has no wheezes.  Abdominal: Soft. Bowel sounds are normal. He exhibits no distension. There is no tenderness.  Musculoskeletal: Normal range of motion. He exhibits no edema.  Neurological: He is alert and oriented to person, place, and time. He displays a negative Romberg sign. Gait abnormal.  Motor strength is 5/5 bilateral deltoid, biceps, triceps, grip, hip flexor, knee extensor, ankle dorsiflexor and plantar flexor  Romberg is negative, although he does have some increased sway  Wide-based gait but no evidence of toe drag or knee instability.  Skin: Skin is warm and dry.  Psychiatric: His speech is normal and behavior is normal. Thought content normal. He exhibits a depressed mood.  Nursing note and vitals reviewed.         Assessment & Plan:   1. History of left thalamic infarct. He has had excellent recovery of functional status. His main deficits are cognitive. He  does not find value in speech therapy , although it has been recommended by both myself and Neuropsychology.  His newer concern is what sounds like increasing tinnitus. He states that it feels different than his previous episode, more of a roaring sound rather than a ringing sound. I do think he needs follow-up with neurology and possibly with ENT. If his dizziness symptoms worsen, he may benefit from a vestibular evaluation.  Physical medicine and rehabilitation follow-up on an as-needed basis

## 2017-02-18 NOTE — Telephone Encounter (Signed)
I spoke to son. He is aware of information below. He will contact PCP and discuss referral to ENT.   Son would like to emphasize that in the last 2 weeks the patient has had trouble dealing with the roaring in his head. He states that it is different than the tinnitus before the stroke. Reports the sound in not in his ears but in his head and goes away when he lays down. Patient has told his family that he would like to "lay down and die".  Son is trying to keep patient as active as possible but states there has been a decline in last 2 weeks.

## 2017-02-18 NOTE — Patient Instructions (Addendum)
See Dr Rexene Alberts for dizziness and

## 2017-02-18 NOTE — Telephone Encounter (Signed)
Patient was seen by Dr. Letta Pate today, please see note, what do you suggest?

## 2017-02-18 NOTE — Telephone Encounter (Signed)
Please advise son, that he should get patient checked out by PCP, may need to see ENT.  If he had sudden changes in symptoms, including sudden onset of one sided weakness, numbness, tingling, slurring of speech or droopy face, sudden hearing loss, sudden ringing in ears, double vision or partial or complete blindness and/or severe headaches, they have to seek medical attention immediately, that is call 911 or go to the emergency room.

## 2017-02-19 ENCOUNTER — Encounter: Payer: Medicare Other | Admitting: *Deleted

## 2017-02-23 ENCOUNTER — Encounter: Payer: Medicare Other | Admitting: *Deleted

## 2017-02-23 DIAGNOSIS — H9313 Tinnitus, bilateral: Secondary | ICD-10-CM | POA: Diagnosis not present

## 2017-02-23 DIAGNOSIS — R42 Dizziness and giddiness: Secondary | ICD-10-CM | POA: Diagnosis not present

## 2017-02-23 DIAGNOSIS — H903 Sensorineural hearing loss, bilateral: Secondary | ICD-10-CM | POA: Diagnosis not present

## 2017-02-24 NOTE — Telephone Encounter (Signed)
Please ask for son to make sure we get the ENT records. Please offer appt with NP next avail.  I have a feeling we have not much to offer for dizziness, had recent stroke, dizziness not a new problem.  Please remind son that unfortunately, dizziness is a very common complaint but is often not due to a primary neurological reason or single underlying medical problem. Often, there a combination of factors, that result in dizziness. This includes blood pressure fluctuations, medication side effects, blood sugar fluctuations, stress, vertigo, poor sleep with sleep deprivation, dehydration, and electrolyte disturbance or other metabolic and endocrinological reasons, meaning hormone related problems such as thyroid dysfunction. Has he seen PCP recently?

## 2017-02-24 NOTE — Telephone Encounter (Signed)
Patients son called office in reference to father going and seeing ENT specialist yesterday afternoon and requesting patient follow up with neurologist.  Please call.

## 2017-02-24 NOTE — Telephone Encounter (Signed)
ENT visit is in his chart, I am unable to download note. Do you want me to bring patient in?

## 2017-02-24 NOTE — Telephone Encounter (Signed)
I spoke to son he is working on getting [place] the notes from ENT. I was abel to get patient in tomorrow with [facility].

## 2017-02-25 ENCOUNTER — Ambulatory Visit (INDEPENDENT_AMBULATORY_CARE_PROVIDER_SITE_OTHER): Payer: Medicare Other | Admitting: Adult Health

## 2017-02-25 ENCOUNTER — Encounter: Payer: Self-pay | Admitting: Adult Health

## 2017-02-25 VITALS — BP 157/82 | HR 84 | Ht 70.0 in | Wt 170.2 lb

## 2017-02-25 DIAGNOSIS — H9312 Tinnitus, left ear: Secondary | ICD-10-CM

## 2017-02-25 DIAGNOSIS — Z8673 Personal history of transient ischemic attack (TIA), and cerebral infarction without residual deficits: Secondary | ICD-10-CM

## 2017-02-25 NOTE — Progress Notes (Addendum)
PATIENT: Jonathan [name] DOB: 1929/07/15  REASON FOR VISIT: follow up- stroke, tinnitus HISTORY FROM: patient  HISTORY OF PRESENT ILLNESS:  Jonathan [name] is an 83 year old male with a history of stroke. He returns today for complaint of dizziness. He reports that he has a long-standing history of tinnitus and starting several months ago he started having a roaring sensation in the left side of the head. His son states that he can't focus or concentrate when this occurs. The only relief he gets is to go back to sleep. He did follow up with ENT who found that distraction actually eliminated this sound but no etiology identified. Patient states that sometimes he can close the left ear with his hand and the sensation goes away. He denies any additional strokelike symptoms. His son states that because the sound is so irritating and it's his primary focus, he has began saying things like if he has to "live with this the rest of his life he wants to die." The patient had an MRI of the brain in January that did show a left thalamic stroke. Patient returns today for an evaluation.  HISTORY 01/04/17: Copied from Dr. Guadelupe Sabin notes: Jonathan [name] is an 83 year old right-handed gentleman with an underlyingcomplexmedical history of BPH, right leg weakness, lumbar degenerative disc disease, history of L3-for herniated disc, coronary artery disease status post four-vessel CABG in 2007, hypertension, paroxysmal A. fib, previously on Xarelto, now on Eliquis, type 2 diabetes with diabetic retinopathy, hyperlipidemia, who presents for follow-up consultation, with a recent history of stroke. He is accompanied by his son today. I first met him on 02/13/2016 at the request of his primary care physician, at which time he reported feeling off balance and dizzy. I ordered an EEG and brain MRI. He had EEG on 03/03/2016: Impression: This is a normal EEG recording in the waking and drowsy state. No evidence of ictal or interictal  discharges are seen.  We called him with his test results.   He had a brain MRI without contrast on 02/21/2016 which showed: IMPRESSION: This MRI of the brain without contrast shows the following: 1. T2/FLAIR hyperintense foci consistent with moderate chronic microvascular ischemic changes related to aging. 2. Mild age-related generalized cortical atrophy 3. There are no acute findings.  We called him with his test results at the time.  Today, 01/04/2017 (all dictated new, as well as above notes, some dictation done in note pad or Word, outside of chart, may appear as copied):  He reports doing reasonably well. He wakes up sometimes not feeling good but is not able to specify fairly well. He has mental fogginess. He has had more cognitive decline since the stroke per her son. He is not very motivated to do his exercises and also quit home health speech therapy. He has been seen by neuropsychologist through the rehabilitation clinic. Of note, he had a left thalamic stroke. He presented on 10/28/2016 with altered mental status, slurring of speech, and right-sided weakness. He was admitted to the hospital, MRI brain showed left thalamic infarct. 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 Echocardiogram showed EF of 60 and diastolic dysfunction. EEG was suggestive of mild encephalopathy but negative for seizure. He was placed on low dose aspirin and started on Xarelto, then changed to Eliquis.  He was transferred to inpatient rehabilitation where he stayed from 10/30/2016 through 11/10/2016.  He reports no pain, weakness is essentially resolved, no numbness is reported. He has some abnormal sensations in  his head and noises in his head, no pulsating tinnitus is reported.  He does not sleep very well. He has sleep interruption and has nocturia once per night on average, does not always wake up rested and feels often confused and foggy headed first thing in the  morning, improves as the day progresses. He has not been driving. He has not had any alcohol since his stroke. He quit smoking in the 70s. He drinks 1 or 2 servings of caffeine per day. He does not always drink enough water. Recently in the past couple of weeks he has started walking, about a mile per day.  The patient's allergies, current medications, family history, past medical history, past social history, past surgical history and problem list were reviewed and updated as appropriate.   Previously (copied from previous notes for reference):   02/13/2016: He reports feeling off balance for some months, and he feels weaker in his L leg. He has had some memory complaints. Overall, he reports that since his hospitalization in December 2016 he does not feel right and not the same. He used to be very active and very strong and now he feels easily exhausted, weaker, off balance, unsteady on his feet at times, mentally less sharp. He brought in a lot of writtennotes andrecords, hand written notes, had multiple questions today and blood pressure and pulse log. All of the questions were reviewed and his notes were reviewed and I reviewed his handwritten blood pressure and pulse log as well. He has been working with his cardiologist with medication changes. Coreg is being phased out. Some of his symptoms were attributed to medication side effects. He has complaints of dizziness. Sometimes he feels that the room is spinning. I reviewed your office note from 09/17/2015, which you kindly included. Blood work from 09/17/2015 was also reviewed, hemoglobin A1c 7.7, BMP unremarkable with the exception of glucose at 1:30, lipid panel showed total cholesterol of 204, triglycerides 167, LDL 139. Of note, he was hospitalized from 09/28/2015 through 10/04/2015 secondary to fever, shortness of breath and palpitation. He was felt to have sepsis secondary to febrile illness from a viral infection. I reviewed records from  the hospitalization including test results and discharge summary. He was treated with IV antibiotics, initially vancomycin and Zosyn and then changed to ceftriaxone and azithromycin.  He had a TTE on 09/28/2015 which showed systolic function was vigorous, estimated EF was 65-70%. He had He had an abnormal stress test on 09/29/15. He had C Doppler study on 10/29/15 with <40% stenosis on R ICA and 40-59% stenosis on L ICA. He had a left heart cath on 10/01/2015 which showed severe three-vessel obstructive coronary artery disease, patent lima to LAD, patent SVG to PDA, occluded sequential SVG to the second and third OM's. Mild LV Dysfunction. Cardiology was consulted. Medical therapy was recommended. He had a chest x-ray and CT chest.  He had a head CT without contrast on 08/08/2015 which I reviewed:IMPRESSION: 1. No acute intracranial abnormalities. 2. 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  He denies any significant snoring. He does not always wake up rested. 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 He quit smoking in 1958 and quit alcohol altogether in 2015 and was an occasional beer drinker before then. He has never used illicit drugs and drinks about 3 cups of coffee per day, not always enough water. It bothers him that he does not have full recollection from  all the events that happened during his hospitalization in December 2016. He was sedated for his left heart cath and has amnesia for quite some time even after the left heart cath.  REVIEW OF SYSTEMS: Out of a complete 14 system review of symptoms, the patient complains only of the following symptoms, and all other reviewed systems are negative.  ALLERGIES: Allergies  Allergen Reactions  . Amlodipine Other (See Comments)    Fatigue   . Codeine     unknown  . Metformin And Related Other (See Comments)    fatigue  . Statins     "pass out, dizzy, drunk feeling,  poor balance"  . Welchol [Colesevelam Hcl]     HOME MEDICATIONS: Outpatient Medications Prior to Visit  Medication Sig Dispense Refill  . apixaban (ELIQUIS) 5 MG TABS tablet Take 1 tablet (5 mg total) by mouth 2 (two) times daily. 60 tablet 0  . glipiZIDE (GLUCOTROL) 5 MG tablet Take 1 tablet (5 mg total) by mouth daily before breakfast. 30 tablet 0  . isosorbide mononitrate (IMDUR) 30 MG 24 hr tablet Take 0.5 tablets (15 mg total) by mouth daily. 30 tablet 1  . linaclotide (LINZESS) 290 MCG CAPS capsule Take 1 capsule (290 mcg total) by mouth daily before breakfast. 30 capsule 1  . pravastatin (PRAVACHOL) 20 MG tablet Take 1 tablet (20 mg total) by mouth daily at 6 PM. 30 tablet 1  . aspirin EC 81 MG EC tablet Take 1 tablet (81 mg total) by mouth daily. (Patient not taking: Reported on 02/25/2017) 30 tablet 0   No facility-administered medications prior to visit.     PAST MEDICAL HISTORY: Past Medical History:  Diagnosis Date  . Allergic rhinitis   . BPH (benign prostatic hypertrophy)   . Coronary atherosclerosis of native coronary artery   . Degeneration of lumbar or lumbosacral intervertebral disc   . Diabetes ([place])    no meds  . Dyslipidemia   . HOH (hard of hearing)   . Hyperlipidemia   . Hypertension   . Right leg weakness   . S/P CABG x 4 2006   LIMA-LAD, SVG-rPDA, sSVG-OM1-OM2  . Stroke ([place])   . Wears glasses    reading    PAST SURGICAL HISTORY: Past Surgical History:  Procedure Laterality Date  . bil carpal tunnel surgery    . CARDIAC CATHETERIZATION N/A 10/01/2015   Procedure: Left Heart Cath and Cors/Grafts Angiography;  Surgeon: Peter M Martinique, MD;  Location: [place] [facility];  Service: Cardiovascular;  Laterality: N/A;  . COLONOSCOPY    . CORONARY ARTERY BYPASS GRAFT  2006    Coronary artery bypass grafting x 4 (left internal mammary  . DUPUYTREN CONTRACTURE RELEASE Left 04/05/2014   Procedure: EXCISION DUPUYTREN LEFT PALM, RING, SMALL, AND THUMB;   Surgeon: Cammie Sickle, MD;  Location: [place];  Service: Orthopedics;  Laterality: Left;  . EYE SURGERY     both cataracts  . HEMORRHOID SURGERY    . LAMINECTOMY    . ROTATOR CUFF REPAIR    . TONSILLECTOMY    . TRANSURETHRAL RESECTION OF PROSTATE      FAMILY HISTORY: Family History  Problem Relation Age of Onset  . Hypertension Father     SOCIAL HISTORY: Social History   Social History  . Marital status: Married    Spouse name: N/A  . Number of children: 2  . Years of education: HS   Occupational History  . Retired  Social History Main Topics  . Smoking status: Former Research scientist (life sciences)  . Smokeless tobacco: Never Used     Comment: Quit 1958  . Alcohol use No  . Drug use: No  . Sexual activity: Not on file   Other Topics Concern  . Not on file   Social History Narrative   Retired ,Scientist, clinical (histocompatibility and immunogenetics), married .      PHYSICAL EXAM  Vitals:   02/25/17 0904  BP: (!) 157/82  Pulse: 84  Weight: 170 lb 3.2 oz (77.2 kg)  Height: _0  (1.778 m)   Body mass index is 24.42 kg/m.  Generalized: Well developed, in no acute distress   Neurological examination  Mentation: Alert oriented to time, place, history taking. Follows all commands speech and language fluent Cranial nerve II-XII: Pupils were equal round reactive to light. Extraocular movements were full, visual field were full on confrontational test. Facial sensation and strength were normal. Uvula tongue midline. Head turning and shoulder shrug  were normal and symmetric. Motor: The motor testing reveals 5 over 5 strength of all 4 extremities. Good symmetric motor tone is noted throughout.  Sensory: Sensory testing is intact to soft touch on all 4 extremities. No evidence of extinction is noted.  Coordination: Cerebellar testing reveals good finger-nose-finger and heel-to-shin bilaterally.  Gait and station: Gait is normal. Tandem gait is normal. Romberg is negative. No drift is seen.    Reflexes: Deep tendon reflexes are symmetric and normal bilaterally.   DIAGNOSTIC DATA (LABS, IMAGING, TESTING) - I reviewed patient records, labs, notes, testing and imaging myself where available.  Lab Results  Component Value Date   WBC 8.3 11/10/2016   HGB 13.1 11/10/2016   HCT 40.0 11/10/2016   MCV 80.2 11/10/2016   PLT 259 11/10/2016      Component Value Date/Time   NA 138 11/01/2016 0851   K 4.1 11/01/2016 0851   CL 103 11/01/2016 0851   CO2 26 11/01/2016 0851   GLUCOSE 166 (H) 11/01/2016 0851   BUN 18 11/01/2016 0851   CREATININE 0.70 11/01/2016 0851   CALCIUM 9.0 11/01/2016 0851   PROT 6.8 11/01/2016 0851   ALBUMIN 3.6 11/01/2016 0851   AST 17 11/01/2016 0851   ALT 13 (L) 11/01/2016 0851   ALKPHOS 71 11/01/2016 0851   BILITOT 0.4 11/01/2016 0851   GFRNONAA >60 11/01/2016 0851   GFRAA >60 11/01/2016 0851   Lab Results  Component Value Date   CHOL 183 10/29/2016   HDL 25 (L) 10/29/2016   LDLCALC 126 (H) 10/29/2016   TRIG 161 (H) 10/29/2016   CHOLHDL 7.3 10/29/2016   Lab Results  Component Value Date   HGBA1C 8.5 (H) 10/29/2016   No results found for: [id number] Lab Results  Component Value Date   TSH 2.004 09/28/2015      ASSESSMENT AND PLAN 83 y.o. year old male  has a past medical history of Allergic rhinitis; BPH (benign prostatic hypertrophy); Coronary atherosclerosis of native coronary artery; Degeneration of lumbar or lumbosacral intervertebral disc; Diabetes ([place]); Dyslipidemia; HOH (hard of hearing); Hyperlipidemia; Hypertension; Right leg weakness; S/P CABG x 4 (2006); Stroke [facility]); and Wears glasses. here with:  1. History of stroke 3. Tinnitus L>R  Patient is reporting a roaring sensation in the left ear. He has a long-standing history of tinnitus. His physical exam is relatively unremarkable today. I advised the patient that from a neurological standpoint we are unsure of the cause. He could consider a device to put in the ear that  could  potentially minimize this sound. Patient is interested in this. I advised that he could look into [facility] tinnitus clinic or discuss this with his ENT physician. I did discuss with Dr. Rexene Alberts and at this time no further workup is needed. Patient should remain well hydrated, well rested and change positions slowly. In regards to potential depression he should follow-up with his primary care and an antidepressant may be needed in the future. Patient and his son is amenable to this plan. He will follow-up on an as-needed basis.    Ward Givens, MSN, NP-C 02/25/2017, 9:12 AM [facility] [address] [place], [place] [address] [phone]  I reviewed the above note and documentation by the Nurse Practitioner and agree with the history, physical exam, assessment and plan as outlined above. I was immediately available for face-to-face consultation. Star Age, MD, PhD [facility])

## 2017-02-25 NOTE — Patient Instructions (Addendum)
Consider [facility]innitus [facility]- or talk to ENT about a device that could distract  Stay well hydrated, stay well rested, change position slowly If your symptoms worsen or you develop new symptoms please let us know.

## 2017-02-26 ENCOUNTER — Encounter: Payer: Medicare Other | Admitting: *Deleted

## 2017-03-02 ENCOUNTER — Encounter: Payer: Medicare Other | Admitting: *Deleted

## 2017-03-04 ENCOUNTER — Encounter: Payer: Self-pay | Admitting: Psychology

## 2017-03-04 ENCOUNTER — Encounter (HOSPITAL_BASED_OUTPATIENT_CLINIC_OR_DEPARTMENT_OTHER): Payer: Medicare Other | Admitting: Psychology

## 2017-03-04 DIAGNOSIS — F4323 Adjustment disorder with mixed anxiety and depressed mood: Secondary | ICD-10-CM

## 2017-03-04 DIAGNOSIS — I6381 Other cerebral infarction due to occlusion or stenosis of small artery: Secondary | ICD-10-CM

## 2017-03-04 DIAGNOSIS — I639 Cerebral infarction, unspecified: Secondary | ICD-10-CM

## 2017-03-04 DIAGNOSIS — I69319 Unspecified symptoms and signs involving cognitive functions following cerebral infarction: Secondary | ICD-10-CM

## 2017-03-04 NOTE — Progress Notes (Signed)
Continue to work on Therapist, occupational and strategies around residual effects of a stroke. The patient remained resistant to engage in all of the therapeutic interventions in activities that are being requested of him. We worked on this issue as well as discussing some of these issues with his son and wife.    Patient:  Jonathan [name]   DOB: 01/19/1929  MR Number: [id number]  Location: [facility] [address] [place] [address] Dept: [phone]  Start: 1 PM End: 3 PM  Provider/Observer:     Edgardo Roys PSYD  Chief Complaint:      Chief Complaint  Patient presents with  . Agitation  . Depression  . Memory Loss    Reason For Service:     The patient was referred by Dr. Letta Pate for psychological/neuropsychological interventions. The patient vascular event in January 2018 where he presented with confusion. 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 The patient has had adjustment difficulties after his hospitalization and is reluctant to go through some of the rehabilitation efforts. He has continued to have some confusion which have played a role in his reluctance. There was a referral for psychological interventions to help him better understand how his potential underlying anxiety and frustration with what has been going on with him is negatively impacting his follow-through in some of the rehabilitation in speech therapy efforts. The patient had a stroke that essentially was described as a thalamic infarct on January 17. The patient has been frustrated about what is happening with him and describes worry and anxiety around his inability to improve and is not very cooperative with the therapist he has been working with.  Testing Administered:  RBANS Form A and WAIS-III  Participation  Level:   Active  Participation Quality:  Appropriate      Behavioral Observation:  Well Groomed, Alert, and Blunt.   Test Results:    DSM-IV Criteria for Dementia A. Development of multiple cognitive deficits: Marland Kitchen Memoryimpairment, plus . One or more Cognitive Disturbances: - Aphasia: disorder of language - Apraxia: impaired motor activities - Agnosia: inability to recognize or identify objects - Dysexecutive: defect defective initiation, planning, organization/abstraction B. Cognitive deficits cause significant impairment in social/occupational functioning and are a decline from previous level of functioning  Mild Cognitive Impairment . Patients who are memory impaired but are otherwise functioning well and do not meet clinical criteria for dementia are classified as having MCI . Symptoms include - Memory complaint, preferably with corroboration - Objective memory impairment - Normal general cognitive function - Intact activities of daily living - Not demented . Patients with MCI should be recognized and monitored for cognitive and functional decline due to their increased risk for subsequent dementia   Qualitative Description of RBANS Index Scores      Classification Descriptors for Subtest Scaled Scores Above 16 very superior       Total Index Score:  71  The patient's performance falls in the borderline range of functioning with regard to his composite score of 71. However, there was tremendous variability within subtest performance and therefore there were clearly areas where he was functioning quite well relative to normative comparison group while other areas he was doing very poorly. Visual-spatial performance and initial auditory encoding were both either in the average range or superior range of functioning however, the patient had significant difficulties with  immediate memory and delayed memory as well as verbal fluency subtest.  Total Index Score .  Composite of all indexes within the battery. Kermit Balo indicator of general cognitive functioning . Low scores strongly suggest general cognitive impairment even when some individual subtest scores may be within normal limits. . Individuals with low scores on this measure exhibit problems with attention memory language and constructional skills attention, memory, language, skills. . Scores in low average to borderline range indicate either general lowering of skills across domains or variability in cognitive functioning with some lower and higher scores.     Immediate Memory Index:  44  The patient's performance on his immediate memory index was in the extremely low range. The patient displayed significant impairments with regard to learning a list of common words. The patient also had great difficulty with regard to short-term storage of information for story learning memory.     Immediate Memory Index . Measure of initial encoding and learning of complex and simple verbal information . Low scores indicate difficulties with verbal learning . Low average or borderline scores on this index can represent general low average verbal memory functioning or may occur when there is variability in the subtests that comprise the index. . Related to ability to self-medicate, manage finances, remember appointments, cook, drive . Impacts all other areas       Visuospatial / Constructional Index:  126  The patient displayed significant skill with regard to his visual-spatial and constructional abilities. While his was able to effectively he did exceptionally well on the line orientation test. This really looks at his visual spatial abilities patient did quite well. This is likely indicative of pre-existing skill in these areas but also the fact that none of these areas of abilities of been affected by stroke.   Visuospatial / Constructional Index . Measures basic visuospatial perception and ability  to copy a design from a model. . Low scores indicate difficulties with processing and using visuospatial information. And may also occur in examinees with severe visual impairments or attention disorders such as hemineglect. . Low average or borderline scores can represent general low average visuospatial functioning or may occur when there is variability in the subtests that comprise the index. Impacts visuospatial processing . Impacts driving, food preparation, use of tools       Attention Index:  91  The patient's performance on the attentional index falls at the lower end of the average range. The patient did exceptionally well with regard to his auditory encoding abilities but did have some difficulties with his overall speed of mental operations and focus execute abilities is significant split suggest that the patient's overall speed of mental operations and processing or problematic for him but that he continues to have a exceptional ability with regard to his auditory encoding, which is a prerequisite for memory and learning. Therefore his difficulties in this area do with speed of information processing with exceptional abilities for auditory encoding.    Attention Index . Measure of simple auditory registration and visual scanning and processing speed. . Low scores indicate difficulties with basic attention processes and speed of information processing. . Low average or borderline scores can represent general low average attention and processing speed or may occur when there is variability in the subtests that comprise the index. . Affects all ADLs      Language Indes:  76  The patient's performance on language subtest falls in the borderline range of functioning. This is a moderate level  of impairment. The patient had moderate deficits with regard to naming particular objects but had significant deficits with regard to verbal fluency abilities. There were no indications of  receptive language deficits but his limitations has to do with verbal fluency and not with specific expressive language deficits.  Language Index . Measure of expressive language functioning. . Low scores indicate difficulties with fluent use of language. . Primarily indicative of expressive language functioning, but individuals with receptive language impairments will also have low scores on this measure. Marland Kitchen Speech Comprehension (Receptive Speech) - Ability to respond to questions - Ability to react appropriately to comments - Ability to respond to instructions for simple tests . Expressive speech - Fluency - Articulation - Prosody - Naming - Repetition    Delayed Memory Index:  48  The patient's performance on this measure falls in the significantly were severely impaired range. The patient had severe deficits with regard to recall information after period of delay. However, his performance was better relative to normative expectations with regard to delayed memory versus immediate memory. This pattern suggests that if information is transferred from encoding function to short-term memory that it is available for recall after period of delay. This clearly suggest that the primary deficit has to do with transferring information from encoding storage to short-term memory stores. If information is initially learned it is generally available for recall that a later time.  Delayed Memory Index . Measure of delayed recall and recognition for verbal and visual information. . Low scores indicate difficulties with recognition and retrieval of information from long-term memory stores. . Impacts all areas.   GENERAL INTELLECTUAL FUNCTIONING:  The patient's performance on the Weschler Adult Intellectual Scale-III, classifies his current intellectual abilities to be in the low average Range, with a Full Scale IQ score of 84, a Verbal IQ score of 79, and a Performance IQ score of 92.  Overall,  his performance places his at the 14th percentile with regard to his age-related peer group.  Analysis on Index Scores produced a Verbal Comprehension Index Score of 74, a Perceptual Organization Index Score of 93, a Working Memory Index Score of 94, and a Processing Speed Index Score of 79.   Overall, WAIS-III performance suggest that the patient's VIQ vs. PIQ were indicative of some significant differences in variability in subtest performance. The patient showed some considerable variability between his verbal and performance global areas of functioning with his visual-spatial abilities to be quite good. The patient showed strength with regard to his visual spatial and perceptual organizational abilities as well as his auditory/short-term encoding abilities. The patient had some significant difficulties with verbal comprehension and verbal reasoning abilities as well as his overall speed of mental operations.     Below are the Age-Adjusted scores for each of the individual WAIS-III subtests.  Vocabulary:    5 Similarities:    4 Arithmetic:    8 Digit Span:    11 Information:    7 Comprehension:   5 Letter Number Sequencing:  9  Picture Completion:   9 Digit Symbol:    8 Block Design:    8 Matrix Reasoning:   10 Picture Arrangement:   10 Symbol Search:   4  WAIS-III analysis reveals a VIQ score of 79, which places his at the eighth percentile.  Significant scatter was noted in subtest performance.  The patient displayed no significant strength relative to his age matched your group.  he performed in the average range on measures of auditory  encoding with straight encoding being an exceptional strength and if there were any multiprocessing demands his performance deteriorated rapidly.  The patient also performed in the average range regard to his mathematical abilities attention was expected. The patient displayed moderately impaired performance on measures of his vocabulary knowledge, verbal  reasoning abilities, general fund of information, and social judgment comprehension abilities.   Clearly, the patient's ability to pull out general information that he should've learned in life as well as his social judgment, comprehension and verbal reasoning abilities are all impaired and likely indicative of significant loss of function from pre-existing abilities.    WAIS-III analysis reveals a PIQ score of 92, which places him at the 30th percentile. Significant scatter was seen on these measures.   Average performance was noted on his ability to develop and understand visual gestalts, visual searching and sequencing, visual analysis and organization, visual reasoning and visual sequencing.  THe patient had significant impairments on measures of visual scanning speed and general speed of mental operations.    Overall, WAIS-III performance was in the Low Average range.  This pattern of strengths and weaknesses is globally consistent with the impact of his recent stroke.   The patients performance on various subtests suggest that there has been a significant loss of function in the verbal areas of vocabulary, verbal reasoning, social judgment and comprehension and speed of mental operations.  Other areas appear to remain intact.  These include mathematical abilities, auditory encoding, visual spatial abilities and visual reasoning.      Summary of Results:   Results of the current neuropsychological evaluation are consistent with significant memory deficits, reduction and verbal fluency and efficiency, reduction overall speed of mental operations, a loss of basic vocabulary, impairments with regard to verbal reasoning and problem-solving, a loss and social judgment comprehension abilities. However, the patient shows continued strength with regard to his visual-spatial and perceptual organizational abilities, visual reasoning and problem-solving, and visual scanning abilities. The memory deficits are  directly related to the transfer of information from an effective encoding/inital storage to short term memory stores.  The limited information that is getting into short term memory is being stored in a way that allows for later retrieval.  These are consistent with the effects of the recent stroke. They're not consistent with other forms of dementia that would be degenerative or progressive in nature and unless there are further strokes we are likely not to see any significant loss of functioning outside of the effects of aging this pattern of strengths and weaknesses is clearly consistent with a focal lesioning reduced by cerebrovascular accident/stroke.  Recommendations:   Given the timeline and the acute nature of his recent stroke, I would highly recommend the patient actively participate in any therapeutic efforts such as speech therapy, which is also helping him in other cognitive areas. The patient is reported to not be physically active at home and this will be very important for him to begin to increase his physical activity. The patient is not delusional and has memory capacity to avoid danger if left alone left alone. His judgement appears intact but his motivation is severely impaired.  His deficits have a lot to do with impairments in expressive language.  Mostly the impairments in his internal self talk that he has utilized in the past for motivation organizing his thinking. This confused internal self language/talk is likely playing a role in lack of motivation and initiative. I would highly recommend assist from family members to help him  engaged in his possible.  Diagnosis:    Axis I: Cognitive deficit, post-stroke  Adjustment disorder with mixed anxiety and depressed mood  Thalamic infarct, acute ([place])   Ilean Skill, Psy.D. Neuropsychologist Patient:  Jonathan [name]   DOB: 10-Sep-1929  MR Number: [id number]  Location: [facility] [address] [place] [address] Dept: [phone]  Start: 10 AM End: 11 AM  Provider/Observer:     Edgardo Roys PSYD  Chief Complaint:      Chief Complaint  Patient presents with  . Agitation  . Depression  . Memory Loss    Reason For Service:     The patient was referred by Dr. Letta Pate for psychological/neuropsychological interventions. The patient vascular event in January 2018 where he presented with confusion. 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 The patient has had adjustment difficulties after his hospitalization and is reluctant to go through some of the rehabilitation efforts. He has continued to have some confusion which have played a role in his reluctance. There was a referral for psychological interventions to help him better understand how his potential underlying anxiety and frustration with what has been going on with him is negatively impacting his follow-through in some of the rehabilitation in speech therapy efforts. The patient had a stroke that essentially was described as a thalamic infarct on January 17. The patient has been frustrated about what is happening with him and describes worry and anxiety around his inability to improve and is not very cooperative with the therapist he has been working with.   Interventions Strategy:  Cognitive behavioral interventions and working on coping and adaptive strategies to help him better utilize therapeutic interventions post stroke.  Participation Level:   Active  Participation Quality:  Appropriate      Behavioral Observation:  Well Groomed, Alert, and Appropriate.   Current Psychosocial Factors: The patient and his son both continued to describe a number of significant difficulties associated with depressive type responses. The patient continues to struggle with tinnitus and  reports of head pain that are quite variable. The patient reports that he did come very frustrated but is unsure what happened 1 day and his son reports that the patient had trouble breathing one day and refused to let them called EMS reporting that he did not want to be saved. The patient denies remembering this.  Content of Session:   Reviewed current symptoms and work on therapeutic interventions around helping the patient better understand what has been happening with him cognitively and working on issues around his motivation and hesitancy towards fully participate.  Current Status:   The patient acknowledged that he continues to be frustrated and agitated by the cognitive and physical changes since stroke.  Patient Progress:   Stable but improving  Target Goals:   Target goals include working on therapeutic interventions around improving and greater understanding.  Last Reviewed:   03/04/2017  Goals Addressed Today:    Today we worked on improving the patient's understanding about what has happened to him as well as working on them understanding why the efforts that he is doing with regard to his speech therapy and other therapeutic interventions are important.  Impression/Diagnosis:   The patient had a stroke/thalamic infarct on October 28, 2016. He went through the neuro rehabilitation program and is now being followed through our outpatient clinic. The patient has  had difficulty coping with some of the changes that he has had and is continuing to have some anxiety and frustration about his recovery and has not been particularly cooperative with therapeutic interventions to this point. The family is concerned about his lack of cooperation.  Diagnosis:   Cognitive deficit, post-stroke  Adjustment disorder with mixed anxiety and depressed mood  Thalamic infarct, acute ([place])

## 2017-03-05 ENCOUNTER — Encounter: Payer: Medicare Other | Admitting: *Deleted

## 2017-03-09 ENCOUNTER — Encounter: Payer: Medicare Other | Admitting: *Deleted

## 2017-03-12 ENCOUNTER — Encounter: Payer: Medicare Other | Admitting: *Deleted

## 2017-03-16 ENCOUNTER — Encounter: Payer: Medicare Other | Admitting: *Deleted

## 2017-03-18 ENCOUNTER — Encounter: Payer: Medicare Other | Admitting: Speech Pathology

## 2017-03-19 ENCOUNTER — Encounter: Payer: Medicare Other | Admitting: *Deleted

## 2017-03-19 DIAGNOSIS — H9313 Tinnitus, bilateral: Secondary | ICD-10-CM | POA: Diagnosis not present

## 2017-03-19 DIAGNOSIS — I10 Essential (primary) hypertension: Secondary | ICD-10-CM | POA: Diagnosis not present

## 2017-03-19 DIAGNOSIS — M25512 Pain in left shoulder: Secondary | ICD-10-CM | POA: Diagnosis not present

## 2017-03-19 DIAGNOSIS — E119 Type 2 diabetes mellitus without complications: Secondary | ICD-10-CM | POA: Diagnosis not present

## 2017-03-29 DIAGNOSIS — E119 Type 2 diabetes mellitus without complications: Secondary | ICD-10-CM | POA: Diagnosis not present

## 2017-03-31 ENCOUNTER — Encounter: Payer: Self-pay | Admitting: Psychology

## 2017-03-31 ENCOUNTER — Encounter: Payer: Medicare Other | Attending: Psychology | Admitting: Psychology

## 2017-03-31 DIAGNOSIS — Z951 Presence of aortocoronary bypass graft: Secondary | ICD-10-CM | POA: Diagnosis not present

## 2017-03-31 DIAGNOSIS — Z8249 Family history of ischemic heart disease and other diseases of the circulatory system: Secondary | ICD-10-CM | POA: Insufficient documentation

## 2017-03-31 DIAGNOSIS — Z7901 Long term (current) use of anticoagulants: Secondary | ICD-10-CM | POA: Diagnosis not present

## 2017-03-31 DIAGNOSIS — I2510 Atherosclerotic heart disease of native coronary artery without angina pectoris: Secondary | ICD-10-CM | POA: Diagnosis not present

## 2017-03-31 DIAGNOSIS — Z7984 Long term (current) use of oral hypoglycemic drugs: Secondary | ICD-10-CM | POA: Insufficient documentation

## 2017-03-31 DIAGNOSIS — E785 Hyperlipidemia, unspecified: Secondary | ICD-10-CM | POA: Insufficient documentation

## 2017-03-31 DIAGNOSIS — E119 Type 2 diabetes mellitus without complications: Secondary | ICD-10-CM | POA: Diagnosis not present

## 2017-03-31 DIAGNOSIS — I10 Essential (primary) hypertension: Secondary | ICD-10-CM | POA: Insufficient documentation

## 2017-03-31 DIAGNOSIS — Z79899 Other long term (current) drug therapy: Secondary | ICD-10-CM | POA: Diagnosis not present

## 2017-03-31 DIAGNOSIS — R413 Other amnesia: Secondary | ICD-10-CM | POA: Diagnosis present

## 2017-03-31 DIAGNOSIS — I639 Cerebral infarction, unspecified: Secondary | ICD-10-CM

## 2017-03-31 DIAGNOSIS — I69319 Unspecified symptoms and signs involving cognitive functions following cerebral infarction: Secondary | ICD-10-CM

## 2017-03-31 DIAGNOSIS — F4323 Adjustment disorder with mixed anxiety and depressed mood: Secondary | ICD-10-CM | POA: Insufficient documentation

## 2017-03-31 DIAGNOSIS — I6381 Other cerebral infarction due to occlusion or stenosis of small artery: Secondary | ICD-10-CM

## 2017-03-31 NOTE — Progress Notes (Signed)
Continue to work on Therapist, occupational and strategies around residual effects of a stroke. The patient remained resistant to engage in all of the therapeutic interventions in activities that are being requested of him. We worked on this issue as well as discussing some of these issues with his son and wife.    Patient:  Jonathan [name]   DOB: 12/02/1928  MR Number: [id number]  Location: [facility] [address], [place] [address] Dept: [phone]  Start: 1 PM End: 3 PM  Provider/Observer:     Edgardo Roys PSYD  Chief Complaint:      Chief Complaint  Patient presents with  . Depression  . Memory Loss  . Other    Tinnitus    Reason For Service:     The patient was referred by Dr. Letta Pate for psychological/neuropsychological interventions. The patient vascular event in January 2018 where he presented with confusion. 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 The patient has had adjustment difficulties after his hospitalization and is reluctant to go through some of the rehabilitation efforts. He has continued to have some confusion which have played a role in his reluctance. There was a referral for psychological interventions to help him better understand how his potential underlying anxiety and frustration with what has been going on with him is negatively impacting his follow-through in some of the rehabilitation in speech therapy efforts. The patient had a stroke that essentially was described as a thalamic infarct on January 17. The patient has been frustrated about what is happening with him and describes worry and anxiety around his inability to improve and is not very cooperative with the therapist he has been working with.  Testing Administered:  RBANS Form A and WAIS-III  Participation  Level:   Active  Participation Quality:  Appropriate      Behavioral Observation:  Well Groomed, Alert, and Blunt.   Test Results:    DSM-IV Criteria for Dementia A. Development of multiple cognitive deficits: Marland Kitchen Memoryimpairment, plus . One or more Cognitive Disturbances: - Aphasia: disorder of language - Apraxia: impaired motor activities - Agnosia: inability to recognize or identify objects - Dysexecutive: defect defective initiation, planning, organization/abstraction B. Cognitive deficits cause significant impairment in social/occupational functioning and are a decline from previous level of functioning  Mild Cognitive Impairment . Patients who are memory impaired but are otherwise functioning well and do not meet clinical criteria for dementia are classified as having MCI . Symptoms include - Memory complaint, preferably with corroboration - Objective memory impairment - Normal general cognitive function - Intact activities of daily living - Not demented . Patients with MCI should be recognized and monitored for cognitive and functional decline due to their increased risk for subsequent dementia   Qualitative Description of RBANS Index Scores      Classification Descriptors for Subtest Scaled Scores Above 16 very superior       Total Index Score:  71  The patient's performance falls in the borderline range of functioning with regard to his composite score of 71. However, there was tremendous variability within subtest performance and therefore there were clearly areas where he was functioning quite well relative to normative comparison group while other areas he was doing very poorly. Visual-spatial performance and initial auditory encoding were both either in the average range or superior range of functioning however, the patient  had significant difficulties with immediate memory and delayed memory as well as verbal fluency subtest.  Total Index Score .  Composite of all indexes within the battery. Kermit Balo indicator of general cognitive functioning . Low scores strongly suggest general cognitive impairment even when some individual subtest scores may be within normal limits. . Individuals with low scores on this measure exhibit problems with attention memory language and constructional skills attention, memory, language, skills. . Scores in low average to borderline range indicate either general lowering of skills across domains or variability in cognitive functioning with some lower and higher scores.     Immediate Memory Index:  44  The patient's performance on his immediate memory index was in the extremely low range. The patient displayed significant impairments with regard to learning a list of common words. The patient also had great difficulty with regard to short-term storage of information for story learning memory.     Immediate Memory Index . Measure of initial encoding and learning of complex and simple verbal information . Low scores indicate difficulties with verbal learning . Low average or borderline scores on this index can represent general low average verbal memory functioning or may occur when there is variability in the subtests that comprise the index. . Related to ability to self-medicate, manage finances, remember appointments, cook, drive . Impacts all other areas       Visuospatial / Constructional Index:  126  The patient displayed significant skill with regard to his visual-spatial and constructional abilities. While his was able to effectively he did exceptionally well on the line orientation test. This really looks at his visual spatial abilities patient did quite well. This is likely indicative of pre-existing skill in these areas but also the fact that none of these areas of abilities of been affected by stroke.   Visuospatial / Constructional Index . Measures basic visuospatial perception and ability  to copy a design from a model. . Low scores indicate difficulties with processing and using visuospatial information. And may also occur in examinees with severe visual impairments or attention disorders such as hemineglect. . Low average or borderline scores can represent general low average visuospatial functioning or may occur when there is variability in the subtests that comprise the index. Impacts visuospatial processing . Impacts driving, food preparation, use of tools       Attention Index:  91  The patient's performance on the attentional index falls at the lower end of the average range. The patient did exceptionally well with regard to his auditory encoding abilities but did have some difficulties with his overall speed of mental operations and focus execute abilities is significant split suggest that the patient's overall speed of mental operations and processing or problematic for him but that he continues to have a exceptional ability with regard to his auditory encoding, which is a prerequisite for memory and learning. Therefore his difficulties in this area do with speed of information processing with exceptional abilities for auditory encoding.    Attention Index . Measure of simple auditory registration and visual scanning and processing speed. . Low scores indicate difficulties with basic attention processes and speed of information processing. . Low average or borderline scores can represent general low average attention and processing speed or may occur when there is variability in the subtests that comprise the index. . Affects all ADLs      Language Indes:  76  The patient's performance on language subtest falls in the borderline range of functioning. This  is a moderate level of impairment. The patient had moderate deficits with regard to naming particular objects but had significant deficits with regard to verbal fluency abilities. There were no indications of  receptive language deficits but his limitations has to do with verbal fluency and not with specific expressive language deficits.  Language Index . Measure of expressive language functioning. . Low scores indicate difficulties with fluent use of language. . Primarily indicative of expressive language functioning, but individuals with receptive language impairments will also have low scores on this measure. Marland Kitchen Speech Comprehension (Receptive Speech) - Ability to respond to questions - Ability to react appropriately to comments - Ability to respond to instructions for simple tests . Expressive speech - Fluency - Articulation - Prosody - Naming - Repetition    Delayed Memory Index:  48  The patient's performance on this measure falls in the significantly were severely impaired range. The patient had severe deficits with regard to recall information after period of delay. However, his performance was better relative to normative expectations with regard to delayed memory versus immediate memory. This pattern suggests that if information is transferred from encoding function to short-term memory that it is available for recall after period of delay. This clearly suggest that the primary deficit has to do with transferring information from encoding storage to short-term memory stores. If information is initially learned it is generally available for recall that a later time.  Delayed Memory Index . Measure of delayed recall and recognition for verbal and visual information. . Low scores indicate difficulties with recognition and retrieval of information from long-term memory stores. . Impacts all areas.   GENERAL INTELLECTUAL FUNCTIONING:  The patient's performance on the Weschler Adult Intellectual Scale-III, classifies his current intellectual abilities to be in the low average Range, with a Full Scale IQ score of 84, a Verbal IQ score of 79, and a Performance IQ score of 92.  Overall,  his performance places his at the 14th percentile with regard to his age-related peer group.  Analysis on Index Scores produced a Verbal Comprehension Index Score of 74, a Perceptual Organization Index Score of 93, a Working Memory Index Score of 94, and a Processing Speed Index Score of 79.   Overall, WAIS-III performance suggest that the patient's VIQ vs. PIQ were indicative of some significant differences in variability in subtest performance. The patient showed some considerable variability between his verbal and performance global areas of functioning with his visual-spatial abilities to be quite good. The patient showed strength with regard to his visual spatial and perceptual organizational abilities as well as his auditory/short-term encoding abilities. The patient had some significant difficulties with verbal comprehension and verbal reasoning abilities as well as his overall speed of mental operations.     Below are the Age-Adjusted scores for each of the individual WAIS-III subtests.  Vocabulary:    5 Similarities:    4 Arithmetic:    8 Digit Span:    11 Information:    7 Comprehension:   5 Letter Number Sequencing:  9  Picture Completion:   9 Digit Symbol:    8 Block Design:    8 Matrix Reasoning:   10 Picture Arrangement:   10 Symbol Search:   4  WAIS-III analysis reveals a VIQ score of 79, which places his at the eighth percentile.  Significant scatter was noted in subtest performance.  The patient displayed no significant strength relative to his age matched your group.  he performed in the average range  on measures of auditory encoding with straight encoding being an exceptional strength and if there were any multiprocessing demands his performance deteriorated rapidly.  The patient also performed in the average range regard to his mathematical abilities attention was expected. The patient displayed moderately impaired performance on measures of his vocabulary knowledge, verbal  reasoning abilities, general fund of information, and social judgment comprehension abilities.   Clearly, the patient's ability to pull out general information that he should've learned in life as well as his social judgment, comprehension and verbal reasoning abilities are all impaired and likely indicative of significant loss of function from pre-existing abilities.    WAIS-III analysis reveals a PIQ score of 92, which places him at the 30th percentile. Significant scatter was seen on these measures.   Average performance was noted on his ability to develop and understand visual gestalts, visual searching and sequencing, visual analysis and organization, visual reasoning and visual sequencing.  THe patient had significant impairments on measures of visual scanning speed and general speed of mental operations.    Overall, WAIS-III performance was in the Low Average range.  This pattern of strengths and weaknesses is globally consistent with the impact of his recent stroke.   The patients performance on various subtests suggest that there has been a significant loss of function in the verbal areas of vocabulary, verbal reasoning, social judgment and comprehension and speed of mental operations.  Other areas appear to remain intact.  These include mathematical abilities, auditory encoding, visual spatial abilities and visual reasoning.      Summary of Results:   Results of the current neuropsychological evaluation are consistent with significant memory deficits, reduction and verbal fluency and efficiency, reduction overall speed of mental operations, a loss of basic vocabulary, impairments with regard to verbal reasoning and problem-solving, a loss and social judgment comprehension abilities. However, the patient shows continued strength with regard to his visual-spatial and perceptual organizational abilities, visual reasoning and problem-solving, and visual scanning abilities. The memory deficits are  directly related to the transfer of information from an effective encoding/inital storage to short term memory stores.  The limited information that is getting into short term memory is being stored in a way that allows for later retrieval.  These are consistent with the effects of the recent stroke. They're not consistent with other forms of dementia that would be degenerative or progressive in nature and unless there are further strokes we are likely not to see any significant loss of functioning outside of the effects of aging this pattern of strengths and weaknesses is clearly consistent with a focal lesioning reduced by cerebrovascular accident/stroke.  Recommendations:   Given the timeline and the acute nature of his recent stroke, I would highly recommend the patient actively participate in any therapeutic efforts such as speech therapy, which is also helping him in other cognitive areas. The patient is reported to not be physically active at home and this will be very important for him to begin to increase his physical activity. The patient is not delusional and has memory capacity to avoid danger if left alone left alone. His judgement appears intact but his motivation is severely impaired.  His deficits have a lot to do with impairments in expressive language.  Mostly the impairments in his internal self talk that he has utilized in the past for motivation organizing his thinking. This confused internal self language/talk is likely playing a role in lack of motivation and initiative. I would highly recommend assist from family  members to help him engaged in his possible.  Diagnosis:    Axis I: Cognitive deficit, post-stroke  Adjustment disorder with mixed anxiety and depressed mood  Thalamic infarct, acute ([place])   Ilean Skill, Psy.D. Neuropsychologist Patient:  Jonathan [name]   DOB: [birth year]  MR Number: [id number]  Location: [facility] [address] [place] [address] Dept: [phone]  Start: 10 AM End: 11 AM  Provider/Observer:     Edgardo Roys PSYD  Chief Complaint:      Chief Complaint  Patient presents with  . Depression  . Memory Loss  . Other    Tinnitus    Reason For Service:     The patient was referred by Dr. Letta Pate for psychological/neuropsychological interventions. The patient vascular event in January 2018 where he presented with confusion. 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 The patient has had adjustment difficulties after his hospitalization and is reluctant to go through some of the rehabilitation efforts. He has continued to have some confusion which have played a role in his reluctance. There was a referral for psychological interventions to help him better understand how his potential underlying anxiety and frustration with what has been going on with him is negatively impacting his follow-through in some of the rehabilitation in speech therapy efforts. The patient had a stroke that essentially was described as a thalamic infarct on January 17. The patient has been frustrated about what is happening with him and describes worry and anxiety around his inability to improve and is not very cooperative with the therapist he has been working with.   Interventions Strategy:  Cognitive behavioral interventions and working on coping and adaptive strategies to help him better utilize therapeutic interventions post stroke.  Participation Level:   Active  Participation Quality:  Appropriate      Behavioral Observation:  Well Groomed, Alert, and Appropriate.   Current Psychosocial Factors: The patient and his son report that the patient has done little but try to lay down as much as he can each day. The patient's son reports that they're trying to get him out to  eat and become active but the patient is consciously complaining about his ears.  Content of Session:   Reviewed current symptoms and work on therapeutic interventions around helping the patient better understand what has been happening with him cognitively and working on issues around his motivation and hesitancy towards fully participate.  Current Status:   The patient acknowledged that he continues to be frustrated and agitated by the cognitive and physical changes since stroke.  The patient was seen at [facility] for the program regarding her treatment for tinnitus. However, the patient was offered digital auditory assist devices but because he didn't immediately alleviate the symptoms the patient refused to give them a try. We have been working on trying to get him to at least give them a try. The patient is clearly extremely depressed and will periodically tell his family that either he feels like he is going to die soon or that he simply does not want to try to do anything to improve his situation.  Patient Progress:   Stable but improving  Target Goals:   Target goals include working on therapeutic interventions around improving and greater understanding.  Last Reviewed:   03/31/2017  Goals Addressed Today:    Today we worked on  improving the patient's understanding about what has happened to him as well as working on them understanding why the efforts that he is doing with regard to his speech therapy and other therapeutic interventions are important.  Impression/Diagnosis:   The patient had a stroke/thalamic infarct on October 28, 2016. He went through the neuro rehabilitation program and is now being followed through our outpatient clinic. The patient has had difficulty coping with some of the changes that he has had and is continuing to have some anxiety and frustration about his recovery and has not been particularly cooperative with therapeutic interventions to this point. The family is  concerned about his lack of cooperation.  Diagnosis:   Cognitive deficit, post-stroke  Adjustment disorder with mixed anxiety and depressed mood  Thalamic infarct, acute ([place])

## 2017-04-06 DIAGNOSIS — J069 Acute upper respiratory infection, unspecified: Secondary | ICD-10-CM | POA: Diagnosis not present

## 2017-06-22 DIAGNOSIS — H348312 Tributary (branch) retinal vein occlusion, right eye, stable: Secondary | ICD-10-CM | POA: Diagnosis not present

## 2017-06-22 DIAGNOSIS — H532 Diplopia: Secondary | ICD-10-CM | POA: Diagnosis not present

## 2017-06-22 DIAGNOSIS — H35351 Cystoid macular degeneration, right eye: Secondary | ICD-10-CM | POA: Diagnosis not present

## 2017-06-22 DIAGNOSIS — D3131 Benign neoplasm of right choroid: Secondary | ICD-10-CM | POA: Diagnosis not present

## 2017-07-13 DIAGNOSIS — H34831 Tributary (branch) retinal vein occlusion, right eye, with macular edema: Secondary | ICD-10-CM | POA: Diagnosis not present

## 2017-07-13 DIAGNOSIS — D3131 Benign neoplasm of right choroid: Secondary | ICD-10-CM | POA: Diagnosis not present

## 2017-07-13 DIAGNOSIS — H43813 Vitreous degeneration, bilateral: Secondary | ICD-10-CM | POA: Diagnosis not present

## 2017-09-28 DIAGNOSIS — I25708 Atherosclerosis of coronary artery bypass graft(s), unspecified, with other forms of angina pectoris: Secondary | ICD-10-CM | POA: Diagnosis not present

## 2017-09-28 DIAGNOSIS — F321 Major depressive disorder, single episode, moderate: Secondary | ICD-10-CM | POA: Diagnosis not present

## 2017-09-28 DIAGNOSIS — E119 Type 2 diabetes mellitus without complications: Secondary | ICD-10-CM | POA: Diagnosis not present

## 2017-09-28 DIAGNOSIS — I10 Essential (primary) hypertension: Secondary | ICD-10-CM | POA: Diagnosis not present

## 2017-11-12 DIAGNOSIS — H34831 Tributary (branch) retinal vein occlusion, right eye, with macular edema: Secondary | ICD-10-CM | POA: Diagnosis not present

## 2017-11-12 DIAGNOSIS — D3131 Benign neoplasm of right choroid: Secondary | ICD-10-CM | POA: Diagnosis not present

## 2017-11-12 DIAGNOSIS — H3561 Retinal hemorrhage, right eye: Secondary | ICD-10-CM | POA: Diagnosis not present

## 2017-11-12 DIAGNOSIS — H43813 Vitreous degeneration, bilateral: Secondary | ICD-10-CM | POA: Diagnosis not present

## 2017-11-18 DIAGNOSIS — R29898 Other symptoms and signs involving the musculoskeletal system: Secondary | ICD-10-CM | POA: Diagnosis not present

## 2017-11-18 DIAGNOSIS — R351 Nocturia: Secondary | ICD-10-CM | POA: Diagnosis not present

## 2017-11-18 DIAGNOSIS — R410 Disorientation, unspecified: Secondary | ICD-10-CM | POA: Diagnosis not present

## 2017-11-19 DIAGNOSIS — R29898 Other symptoms and signs involving the musculoskeletal system: Secondary | ICD-10-CM | POA: Diagnosis not present

## 2018-01-18 DIAGNOSIS — Z7901 Long term (current) use of anticoagulants: Secondary | ICD-10-CM | POA: Diagnosis not present

## 2018-01-18 DIAGNOSIS — I10 Essential (primary) hypertension: Secondary | ICD-10-CM | POA: Diagnosis not present

## 2018-01-18 DIAGNOSIS — M5137 Other intervertebral disc degeneration, lumbosacral region: Secondary | ICD-10-CM | POA: Diagnosis not present

## 2018-01-18 DIAGNOSIS — I2510 Atherosclerotic heart disease of native coronary artery without angina pectoris: Secondary | ICD-10-CM | POA: Diagnosis not present

## 2018-01-18 DIAGNOSIS — E785 Hyperlipidemia, unspecified: Secondary | ICD-10-CM | POA: Diagnosis not present

## 2018-01-18 DIAGNOSIS — Z8673 Personal history of transient ischemic attack (TIA), and cerebral infarction without residual deficits: Secondary | ICD-10-CM | POA: Diagnosis not present

## 2018-01-18 DIAGNOSIS — R2689 Other abnormalities of gait and mobility: Secondary | ICD-10-CM | POA: Diagnosis not present

## 2018-01-18 DIAGNOSIS — N400 Enlarged prostate without lower urinary tract symptoms: Secondary | ICD-10-CM | POA: Diagnosis not present

## 2018-01-18 DIAGNOSIS — Z9181 History of falling: Secondary | ICD-10-CM | POA: Diagnosis not present

## 2018-01-18 DIAGNOSIS — Z87891 Personal history of nicotine dependence: Secondary | ICD-10-CM | POA: Diagnosis not present

## 2018-01-18 DIAGNOSIS — E11319 Type 2 diabetes mellitus with unspecified diabetic retinopathy without macular edema: Secondary | ICD-10-CM | POA: Diagnosis not present

## 2018-01-18 DIAGNOSIS — Z7984 Long term (current) use of oral hypoglycemic drugs: Secondary | ICD-10-CM | POA: Diagnosis not present

## 2018-01-19 ENCOUNTER — Ambulatory Visit
Admission: RE | Admit: 2018-01-19 | Discharge: 2018-01-19 | Disposition: A | Discharge status: Home / Self Care | Payer: Medicare Other | Source: Ambulatory Visit | Attending: Geriatric Medicine | Admitting: Geriatric Medicine

## 2018-01-19 ENCOUNTER — Other Ambulatory Visit: Payer: Self-pay | Admitting: Geriatric Medicine

## 2018-01-19 DIAGNOSIS — M47816 Spondylosis without myelopathy or radiculopathy, lumbar region: Secondary | ICD-10-CM | POA: Diagnosis not present

## 2018-01-19 DIAGNOSIS — M5442 Lumbago with sciatica, left side: Secondary | ICD-10-CM

## 2018-01-19 DIAGNOSIS — M5441 Lumbago with sciatica, right side: Secondary | ICD-10-CM | POA: Diagnosis not present

## 2018-01-19 DIAGNOSIS — R29898 Other symptoms and signs involving the musculoskeletal system: Secondary | ICD-10-CM

## 2018-01-19 DIAGNOSIS — M1612 Unilateral primary osteoarthritis, left hip: Secondary | ICD-10-CM | POA: Diagnosis not present

## 2018-02-01 ENCOUNTER — Other Ambulatory Visit: Payer: Self-pay | Admitting: Internal Medicine

## 2018-02-01 DIAGNOSIS — M5432 Sciatica, left side: Secondary | ICD-10-CM

## 2018-02-03 DIAGNOSIS — E119 Type 2 diabetes mellitus without complications: Secondary | ICD-10-CM | POA: Diagnosis not present

## 2018-02-05 ENCOUNTER — Ambulatory Visit
Admission: RE | Admit: 2018-02-05 | Discharge: 2018-02-05 | Disposition: A | Discharge status: Home / Self Care | Payer: Medicare Other | Source: Ambulatory Visit | Attending: Internal Medicine | Admitting: Internal Medicine

## 2018-02-05 DIAGNOSIS — M5432 Sciatica, left side: Secondary | ICD-10-CM

## 2018-02-05 DIAGNOSIS — E119 Type 2 diabetes mellitus without complications: Secondary | ICD-10-CM | POA: Diagnosis not present

## 2018-02-05 DIAGNOSIS — M48061 Spinal stenosis, lumbar region without neurogenic claudication: Secondary | ICD-10-CM | POA: Diagnosis not present

## 2018-02-25 DIAGNOSIS — M5412 Radiculopathy, cervical region: Secondary | ICD-10-CM | POA: Diagnosis not present

## 2018-02-25 DIAGNOSIS — I10 Essential (primary) hypertension: Secondary | ICD-10-CM | POA: Diagnosis not present

## 2018-02-25 DIAGNOSIS — M542 Cervicalgia: Secondary | ICD-10-CM | POA: Diagnosis not present

## 2018-02-25 DIAGNOSIS — Z6826 Body mass index (BMI) 26.0-26.9, adult: Secondary | ICD-10-CM | POA: Diagnosis not present

## 2018-03-11 DIAGNOSIS — H35372 Puckering of macula, left eye: Secondary | ICD-10-CM | POA: Diagnosis not present

## 2018-03-11 DIAGNOSIS — D3131 Benign neoplasm of right choroid: Secondary | ICD-10-CM | POA: Diagnosis not present

## 2018-03-11 DIAGNOSIS — H34831 Tributary (branch) retinal vein occlusion, right eye, with macular edema: Secondary | ICD-10-CM | POA: Diagnosis not present

## 2018-03-11 DIAGNOSIS — H43813 Vitreous degeneration, bilateral: Secondary | ICD-10-CM | POA: Diagnosis not present

## 2018-03-30 DIAGNOSIS — Z7984 Long term (current) use of oral hypoglycemic drugs: Secondary | ICD-10-CM | POA: Diagnosis not present

## 2018-03-30 DIAGNOSIS — M48062 Spinal stenosis, lumbar region with neurogenic claudication: Secondary | ICD-10-CM | POA: Diagnosis not present

## 2018-03-30 DIAGNOSIS — E1169 Type 2 diabetes mellitus with other specified complication: Secondary | ICD-10-CM | POA: Diagnosis not present

## 2018-03-30 DIAGNOSIS — I10 Essential (primary) hypertension: Secondary | ICD-10-CM | POA: Diagnosis not present

## 2018-05-09 ENCOUNTER — Ambulatory Visit
Admission: RE | Admit: 2018-05-09 | Discharge: 2018-05-09 | Disposition: A | Discharge status: Home / Self Care | Payer: Medicare Other | Source: Ambulatory Visit | Attending: Nurse Practitioner | Admitting: Nurse Practitioner

## 2018-05-09 ENCOUNTER — Other Ambulatory Visit: Payer: Self-pay | Admitting: Nurse Practitioner

## 2018-05-09 DIAGNOSIS — M549 Dorsalgia, unspecified: Secondary | ICD-10-CM

## 2018-05-09 DIAGNOSIS — M5442 Lumbago with sciatica, left side: Secondary | ICD-10-CM | POA: Diagnosis not present

## 2018-05-09 DIAGNOSIS — J9811 Atelectasis: Secondary | ICD-10-CM | POA: Diagnosis not present

## 2018-05-09 DIAGNOSIS — R0781 Pleurodynia: Secondary | ICD-10-CM

## 2018-05-09 DIAGNOSIS — M545 Low back pain: Secondary | ICD-10-CM | POA: Diagnosis not present

## 2018-05-09 DIAGNOSIS — M5441 Lumbago with sciatica, right side: Secondary | ICD-10-CM | POA: Diagnosis not present

## 2018-05-18 DIAGNOSIS — I69351 Hemiplegia and hemiparesis following cerebral infarction affecting right dominant side: Secondary | ICD-10-CM | POA: Diagnosis not present

## 2018-05-18 DIAGNOSIS — E11319 Type 2 diabetes mellitus with unspecified diabetic retinopathy without macular edema: Secondary | ICD-10-CM | POA: Diagnosis not present

## 2018-05-18 DIAGNOSIS — I10 Essential (primary) hypertension: Secondary | ICD-10-CM | POA: Diagnosis not present

## 2018-05-18 DIAGNOSIS — I2510 Atherosclerotic heart disease of native coronary artery without angina pectoris: Secondary | ICD-10-CM | POA: Diagnosis not present

## 2018-05-18 DIAGNOSIS — I69311 Memory deficit following cerebral infarction: Secondary | ICD-10-CM | POA: Diagnosis not present

## 2018-05-18 DIAGNOSIS — I480 Paroxysmal atrial fibrillation: Secondary | ICD-10-CM | POA: Diagnosis not present

## 2018-06-03 DIAGNOSIS — R413 Other amnesia: Secondary | ICD-10-CM | POA: Diagnosis not present

## 2018-06-03 DIAGNOSIS — I10 Essential (primary) hypertension: Secondary | ICD-10-CM | POA: Diagnosis not present

## 2018-06-03 DIAGNOSIS — E538 Deficiency of other specified B group vitamins: Secondary | ICD-10-CM | POA: Diagnosis not present

## 2018-06-10 IMAGING — CT CT HEAD W/O CM
3 series · 14 of 47 positions shown, 16 images · non-contrast
Comparison: MRI 02/21/2016, CT 08/07/2015

CLINICAL DATA: Altered mental status possible fall

EXAM:
CT HEAD WITHOUT CONTRAST
TECHNIQUE: Contiguous axial images were obtained from the base of the skull
through the vertex without intravenous contrast.

[Series 2: head 5.0 h30s · axial · 0.44mm/px · z∈[+1141,+1271]mm · 8 of 32 slices shown, 10 images]
[im 3/32  brain]
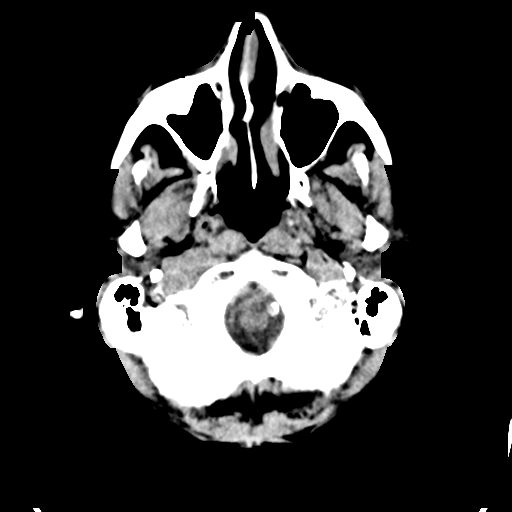
[im 3/32  bone]
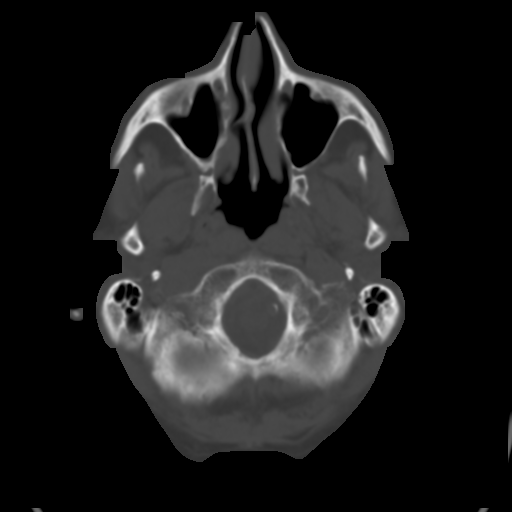
[im 7/32  brain]
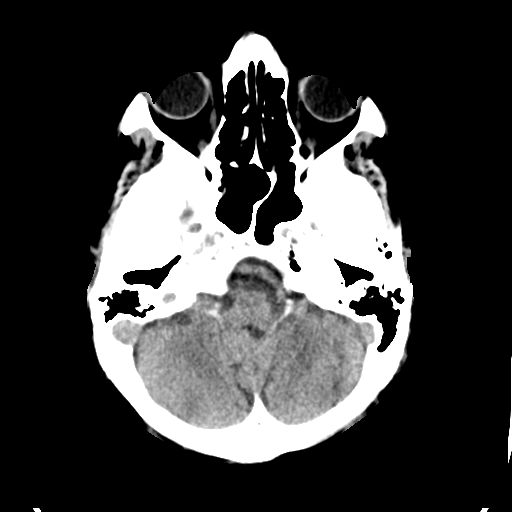
[im 10/32  brain]
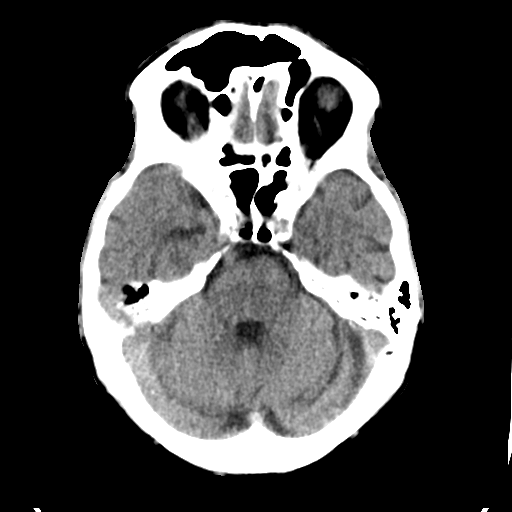
[im 14/32  brain]
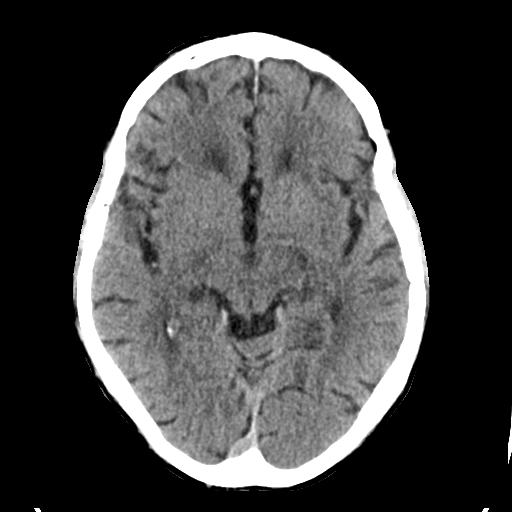
[im 18/32  brain]
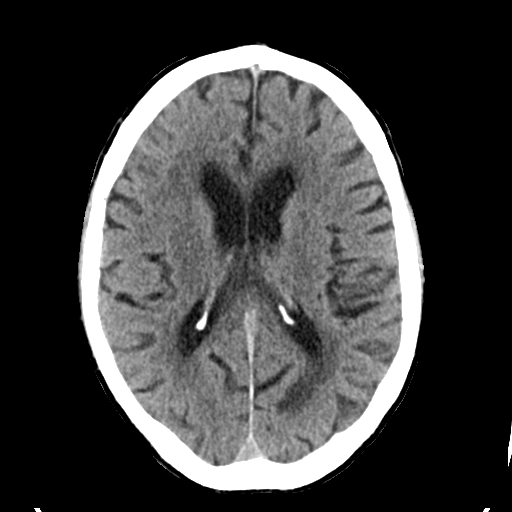
[im 18/32  bone]
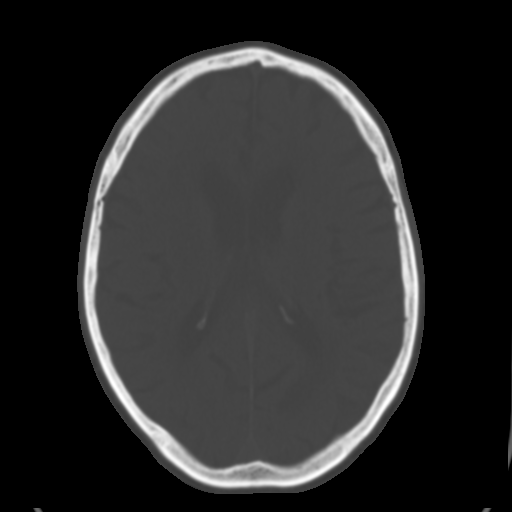
[im 22/32  brain]
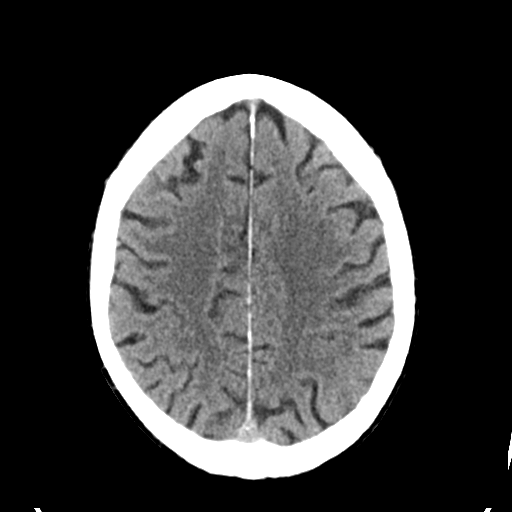
[im 25/32  brain]
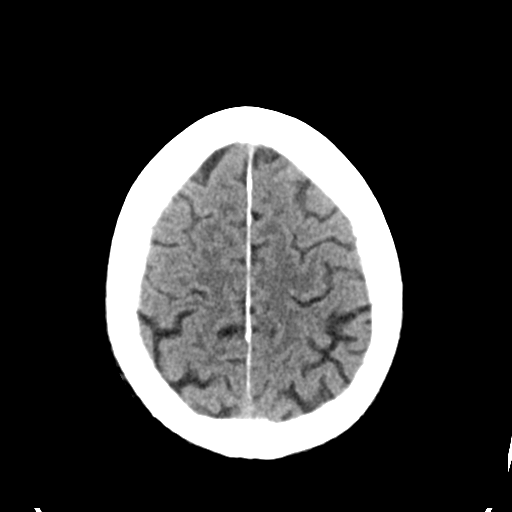
[im 29/32  brain]
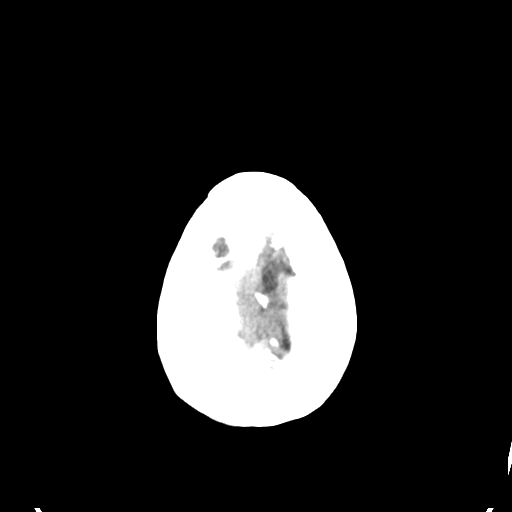

[Series 4: head 3.0 mpr cor · coronal · 0.31mm/px · 3 of 73 slices shown]
[im 25/73  brain]
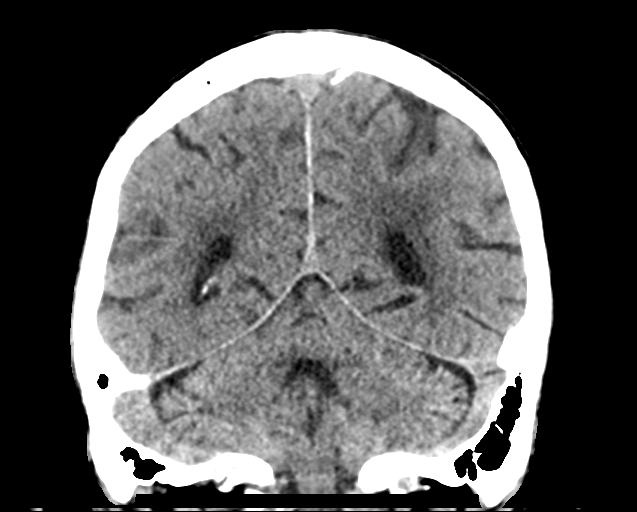
[im 33/73  brain]
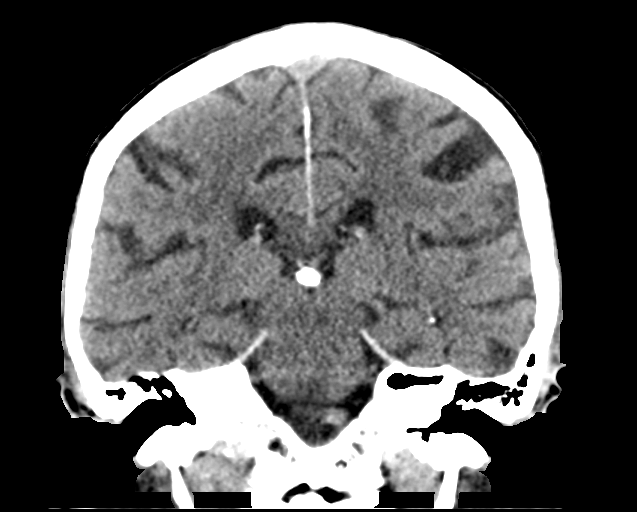
[im 41/73  brain]
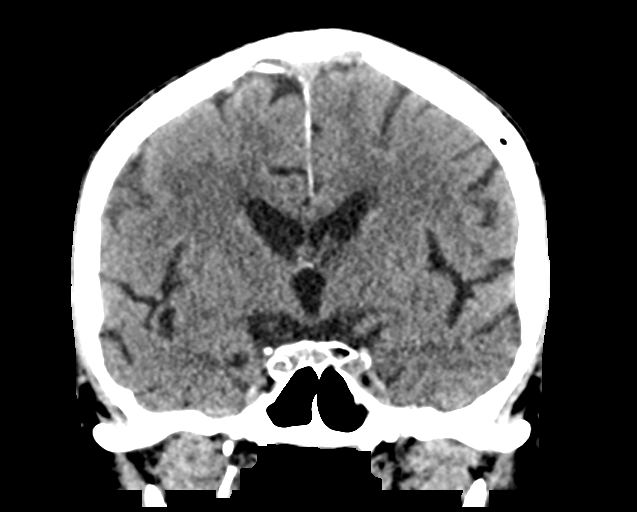

[Series 5: head 3.0 mpr sag · sagittal · 0.31mm/px · 3 of 58 slices shown]
[im 20/58  brain]
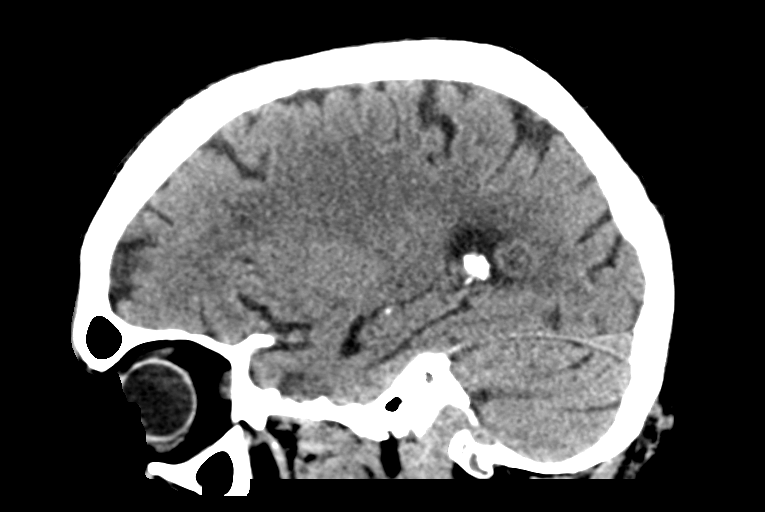
[im 29/58  brain]
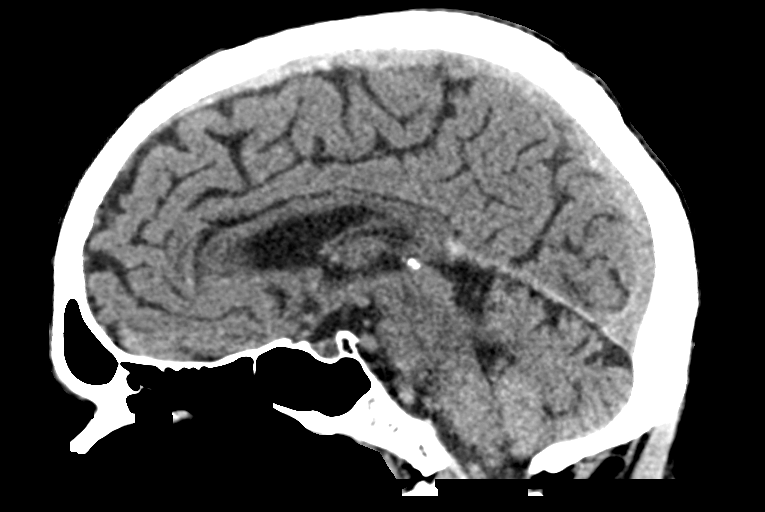
[im 39/58  brain]
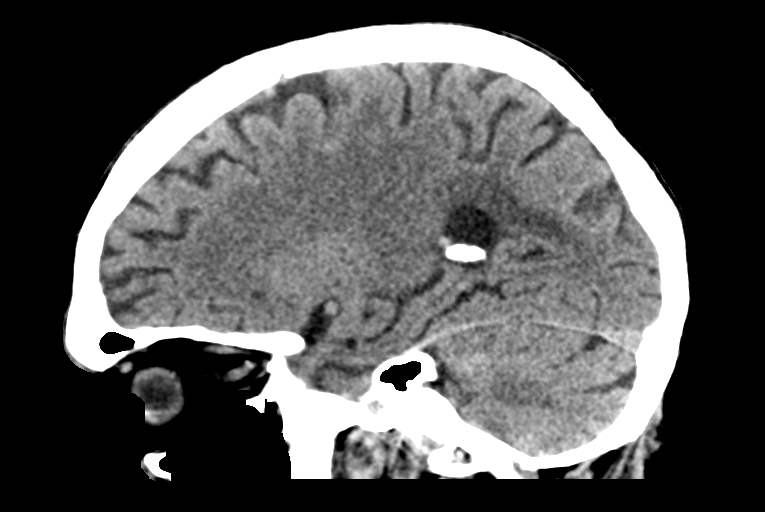

[14 of 47 positions shown; findings below may reference images not displayed]

FINDINGS: Brain: No acute territorial infarction, intracranial hemorrhage or
extra-axial fluid collection is seen. No focal mass, mass effect or
midline shift. Mild to moderate periventricular white matter
hypodensity consistent with small vessel disease. Old left thalamic
lacunar infarcts as before. Old left cerebellar infarct as before.
Ventricles are similar in size compared to the prior study. There is
mild atrophy.

Vascular: No hyperdense vessels.  Carotid artery calcifications.

Skull: Mastoid air cells clear.  No skull fracture

Sinuses/Orbits: Mucosal thickening in the maxillary ethmoid and
sphenoid sinuses. No acute orbital abnormality. Bilateral lens
extraction.

Other: None
IMPRESSION: No definite CT evidence for acute intracranial abnormality

## 2018-06-10 IMAGING — DX DG CHEST 2V
2 series · 2 of 2 positions shown · non-contrast
Comparison: 10/02/2015

CLINICAL DATA: Altered mental status

EXAM:
CHEST  2 VIEW

[x chest ap]
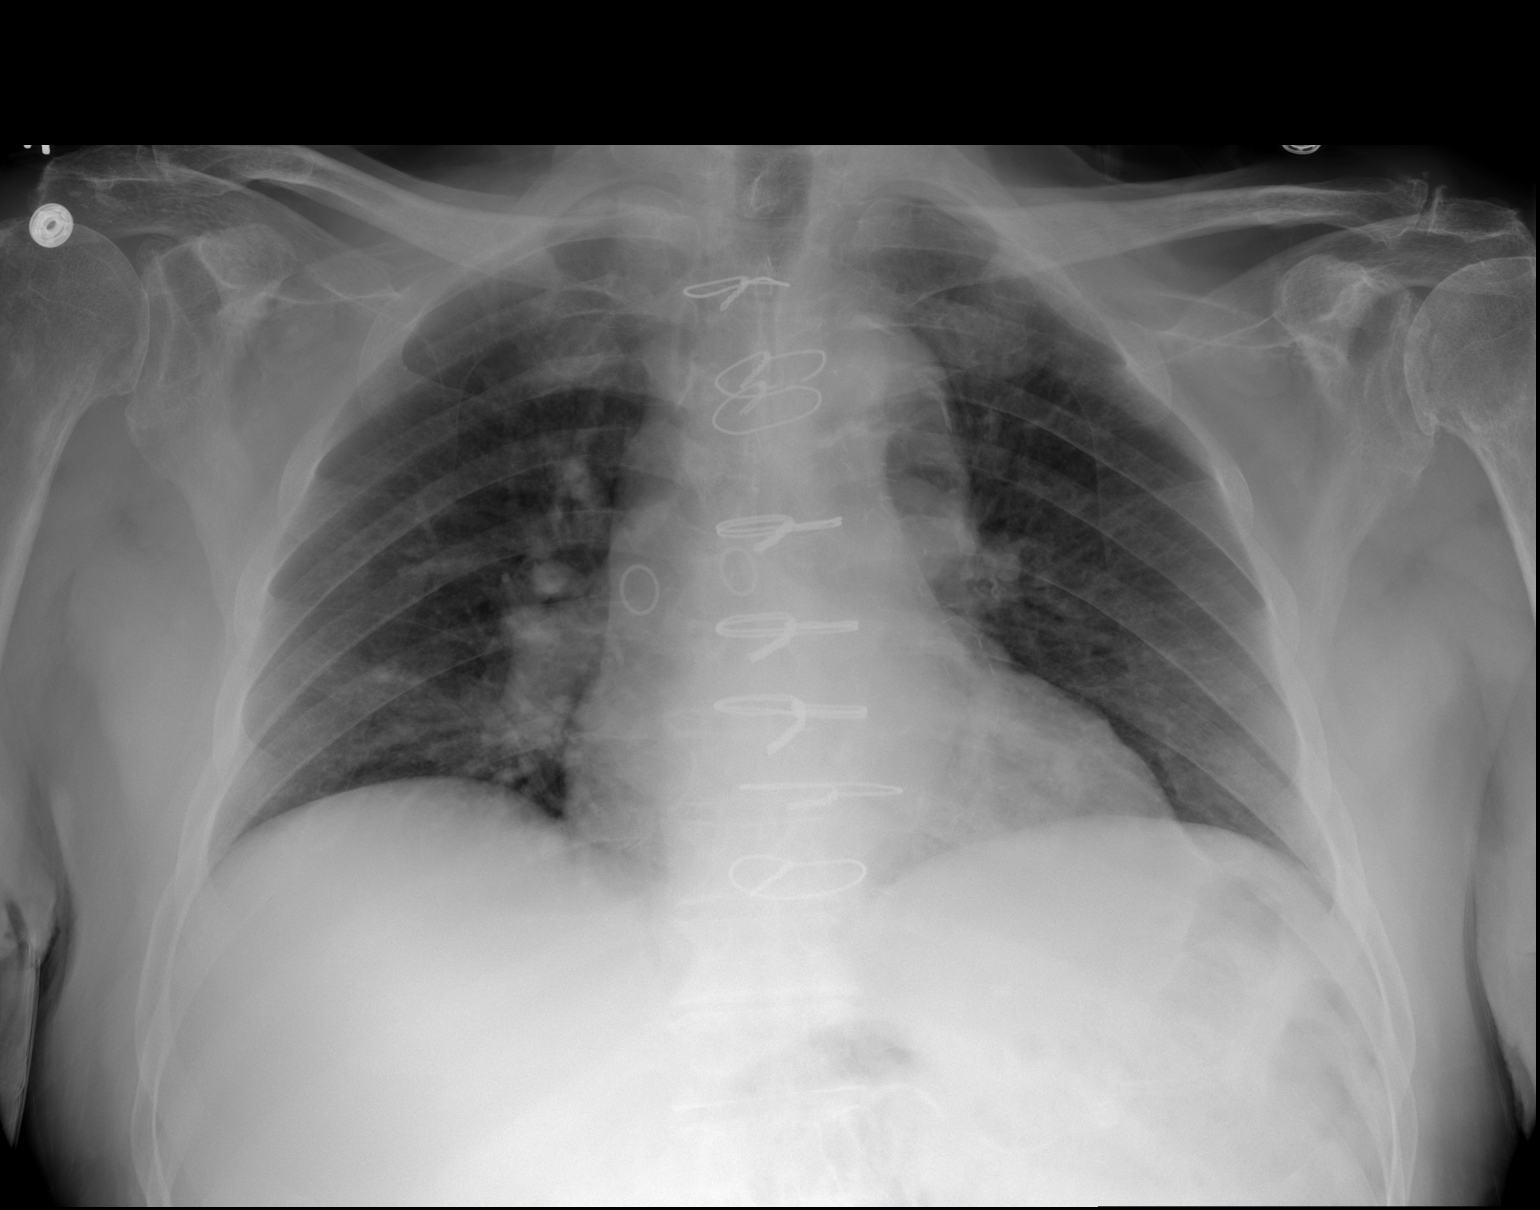

[w chest lat]
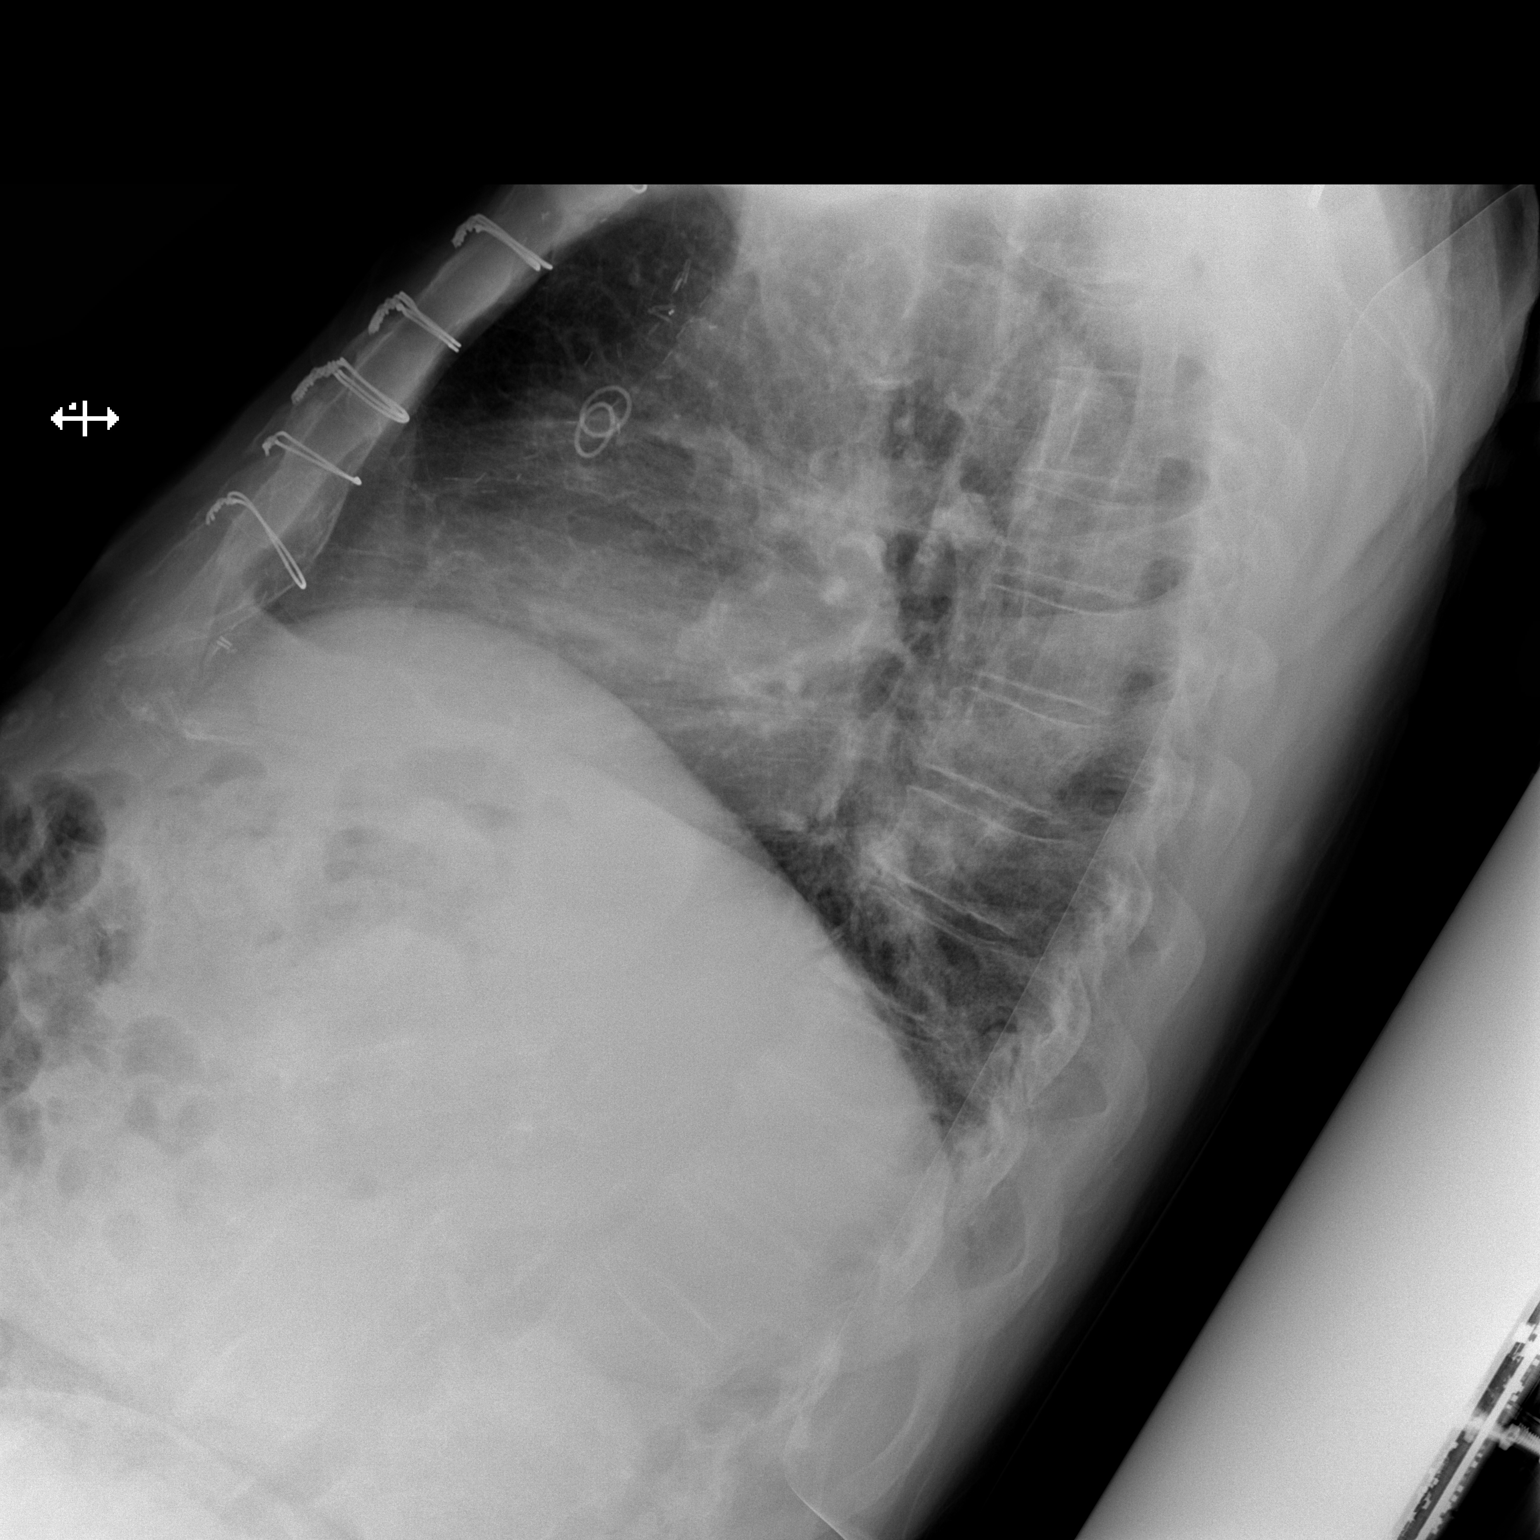

[2 of 2 positions shown; findings below may reference images not displayed]

FINDINGS: AP and lateral views of the chest show low volumes. The cardio
pericardial silhouette is enlarged. There is pulmonary vascular
congestion without overt pulmonary edema. Patient is status post
CABG.
IMPRESSION: No active cardiopulmonary disease.

## 2018-06-22 DIAGNOSIS — D3131 Benign neoplasm of right choroid: Secondary | ICD-10-CM | POA: Diagnosis not present

## 2018-06-22 DIAGNOSIS — H35372 Puckering of macula, left eye: Secondary | ICD-10-CM | POA: Diagnosis not present

## 2018-06-22 DIAGNOSIS — Z961 Presence of intraocular lens: Secondary | ICD-10-CM | POA: Diagnosis not present

## 2018-06-22 DIAGNOSIS — H35351 Cystoid macular degeneration, right eye: Secondary | ICD-10-CM | POA: Diagnosis not present

## 2018-07-18 DIAGNOSIS — E538 Deficiency of other specified B group vitamins: Secondary | ICD-10-CM | POA: Diagnosis not present

## 2018-09-23 DIAGNOSIS — H348322 Tributary (branch) retinal vein occlusion, left eye, stable: Secondary | ICD-10-CM | POA: Diagnosis not present

## 2018-09-23 DIAGNOSIS — H35372 Puckering of macula, left eye: Secondary | ICD-10-CM | POA: Diagnosis not present

## 2018-09-23 DIAGNOSIS — H43813 Vitreous degeneration, bilateral: Secondary | ICD-10-CM | POA: Diagnosis not present

## 2018-09-23 DIAGNOSIS — D3131 Benign neoplasm of right choroid: Secondary | ICD-10-CM | POA: Diagnosis not present

## 2018-10-17 DIAGNOSIS — Z0000 Encounter for general adult medical examination without abnormal findings: Secondary | ICD-10-CM | POA: Diagnosis not present

## 2018-10-17 DIAGNOSIS — I10 Essential (primary) hypertension: Secondary | ICD-10-CM | POA: Diagnosis not present

## 2018-10-17 DIAGNOSIS — Z1389 Encounter for screening for other disorder: Secondary | ICD-10-CM | POA: Diagnosis not present

## 2018-10-17 DIAGNOSIS — E1169 Type 2 diabetes mellitus with other specified complication: Secondary | ICD-10-CM | POA: Diagnosis not present

## 2018-10-17 DIAGNOSIS — M48062 Spinal stenosis, lumbar region with neurogenic claudication: Secondary | ICD-10-CM | POA: Diagnosis not present

## 2018-10-17 DIAGNOSIS — M5412 Radiculopathy, cervical region: Secondary | ICD-10-CM | POA: Diagnosis not present

## 2018-11-18 DIAGNOSIS — Z9181 History of falling: Secondary | ICD-10-CM | POA: Diagnosis not present

## 2018-11-18 DIAGNOSIS — I69311 Memory deficit following cerebral infarction: Secondary | ICD-10-CM | POA: Diagnosis not present

## 2018-11-18 DIAGNOSIS — F0150 Vascular dementia without behavioral disturbance: Secondary | ICD-10-CM | POA: Diagnosis not present

## 2018-11-18 DIAGNOSIS — E11319 Type 2 diabetes mellitus with unspecified diabetic retinopathy without macular edema: Secondary | ICD-10-CM | POA: Diagnosis not present

## 2018-11-18 DIAGNOSIS — M5126 Other intervertebral disc displacement, lumbar region: Secondary | ICD-10-CM | POA: Diagnosis not present

## 2018-11-18 DIAGNOSIS — M5412 Radiculopathy, cervical region: Secondary | ICD-10-CM | POA: Diagnosis not present

## 2018-11-18 DIAGNOSIS — Z87891 Personal history of nicotine dependence: Secondary | ICD-10-CM | POA: Diagnosis not present

## 2018-11-18 DIAGNOSIS — I69341 Monoplegia of lower limb following cerebral infarction affecting right dominant side: Secondary | ICD-10-CM | POA: Diagnosis not present

## 2018-11-18 DIAGNOSIS — M47816 Spondylosis without myelopathy or radiculopathy, lumbar region: Secondary | ICD-10-CM | POA: Diagnosis not present

## 2018-11-18 DIAGNOSIS — E785 Hyperlipidemia, unspecified: Secondary | ICD-10-CM | POA: Diagnosis not present

## 2018-11-18 DIAGNOSIS — I2510 Atherosclerotic heart disease of native coronary artery without angina pectoris: Secondary | ICD-10-CM | POA: Diagnosis not present

## 2018-11-18 DIAGNOSIS — M48062 Spinal stenosis, lumbar region with neurogenic claudication: Secondary | ICD-10-CM | POA: Diagnosis not present

## 2018-11-18 DIAGNOSIS — I10 Essential (primary) hypertension: Secondary | ICD-10-CM | POA: Diagnosis not present

## 2018-11-18 DIAGNOSIS — I480 Paroxysmal atrial fibrillation: Secondary | ICD-10-CM | POA: Diagnosis not present

## 2018-11-18 DIAGNOSIS — N400 Enlarged prostate without lower urinary tract symptoms: Secondary | ICD-10-CM | POA: Diagnosis not present

## 2018-11-18 DIAGNOSIS — Z7901 Long term (current) use of anticoagulants: Secondary | ICD-10-CM | POA: Diagnosis not present

## 2018-12-19 DIAGNOSIS — I69311 Memory deficit following cerebral infarction: Secondary | ICD-10-CM | POA: Diagnosis not present

## 2018-12-19 DIAGNOSIS — E11319 Type 2 diabetes mellitus with unspecified diabetic retinopathy without macular edema: Secondary | ICD-10-CM | POA: Diagnosis not present

## 2018-12-19 DIAGNOSIS — I69341 Monoplegia of lower limb following cerebral infarction affecting right dominant side: Secondary | ICD-10-CM | POA: Diagnosis not present

## 2018-12-19 DIAGNOSIS — Z7901 Long term (current) use of anticoagulants: Secondary | ICD-10-CM | POA: Diagnosis not present

## 2018-12-19 DIAGNOSIS — M47816 Spondylosis without myelopathy or radiculopathy, lumbar region: Secondary | ICD-10-CM | POA: Diagnosis not present

## 2018-12-19 DIAGNOSIS — I10 Essential (primary) hypertension: Secondary | ICD-10-CM | POA: Diagnosis not present

## 2018-12-19 DIAGNOSIS — F0150 Vascular dementia without behavioral disturbance: Secondary | ICD-10-CM | POA: Diagnosis not present

## 2018-12-19 DIAGNOSIS — M5412 Radiculopathy, cervical region: Secondary | ICD-10-CM | POA: Diagnosis not present

## 2018-12-19 DIAGNOSIS — N400 Enlarged prostate without lower urinary tract symptoms: Secondary | ICD-10-CM | POA: Diagnosis not present

## 2018-12-19 DIAGNOSIS — E785 Hyperlipidemia, unspecified: Secondary | ICD-10-CM | POA: Diagnosis not present

## 2018-12-19 DIAGNOSIS — M48062 Spinal stenosis, lumbar region with neurogenic claudication: Secondary | ICD-10-CM | POA: Diagnosis not present

## 2018-12-19 DIAGNOSIS — Z87891 Personal history of nicotine dependence: Secondary | ICD-10-CM | POA: Diagnosis not present

## 2018-12-19 DIAGNOSIS — I480 Paroxysmal atrial fibrillation: Secondary | ICD-10-CM | POA: Diagnosis not present

## 2018-12-19 DIAGNOSIS — M5126 Other intervertebral disc displacement, lumbar region: Secondary | ICD-10-CM | POA: Diagnosis not present

## 2018-12-19 DIAGNOSIS — Z9181 History of falling: Secondary | ICD-10-CM | POA: Diagnosis not present

## 2018-12-19 DIAGNOSIS — I2510 Atherosclerotic heart disease of native coronary artery without angina pectoris: Secondary | ICD-10-CM | POA: Diagnosis not present

## 2019-01-02 DIAGNOSIS — N390 Urinary tract infection, site not specified: Secondary | ICD-10-CM | POA: Diagnosis not present

## 2019-01-05 DIAGNOSIS — R531 Weakness: Secondary | ICD-10-CM | POA: Diagnosis not present

## 2019-01-05 DIAGNOSIS — N390 Urinary tract infection, site not specified: Secondary | ICD-10-CM | POA: Diagnosis not present

## 2019-01-19 DIAGNOSIS — Z7901 Long term (current) use of anticoagulants: Secondary | ICD-10-CM | POA: Diagnosis not present

## 2019-01-19 DIAGNOSIS — M47816 Spondylosis without myelopathy or radiculopathy, lumbar region: Secondary | ICD-10-CM | POA: Diagnosis not present

## 2019-01-19 DIAGNOSIS — I69341 Monoplegia of lower limb following cerebral infarction affecting right dominant side: Secondary | ICD-10-CM | POA: Diagnosis not present

## 2019-01-19 DIAGNOSIS — I10 Essential (primary) hypertension: Secondary | ICD-10-CM | POA: Diagnosis not present

## 2019-01-19 DIAGNOSIS — F0150 Vascular dementia without behavioral disturbance: Secondary | ICD-10-CM | POA: Diagnosis not present

## 2019-01-19 DIAGNOSIS — E11319 Type 2 diabetes mellitus with unspecified diabetic retinopathy without macular edema: Secondary | ICD-10-CM | POA: Diagnosis not present

## 2019-01-19 DIAGNOSIS — M5412 Radiculopathy, cervical region: Secondary | ICD-10-CM | POA: Diagnosis not present

## 2019-01-19 DIAGNOSIS — M5126 Other intervertebral disc displacement, lumbar region: Secondary | ICD-10-CM | POA: Diagnosis not present

## 2019-01-19 DIAGNOSIS — Z87891 Personal history of nicotine dependence: Secondary | ICD-10-CM | POA: Diagnosis not present

## 2019-01-19 DIAGNOSIS — E785 Hyperlipidemia, unspecified: Secondary | ICD-10-CM | POA: Diagnosis not present

## 2019-01-19 DIAGNOSIS — M48062 Spinal stenosis, lumbar region with neurogenic claudication: Secondary | ICD-10-CM | POA: Diagnosis not present

## 2019-01-19 DIAGNOSIS — N400 Enlarged prostate without lower urinary tract symptoms: Secondary | ICD-10-CM | POA: Diagnosis not present

## 2019-01-19 DIAGNOSIS — I69311 Memory deficit following cerebral infarction: Secondary | ICD-10-CM | POA: Diagnosis not present

## 2019-01-19 DIAGNOSIS — I2510 Atherosclerotic heart disease of native coronary artery without angina pectoris: Secondary | ICD-10-CM | POA: Diagnosis not present

## 2019-01-19 DIAGNOSIS — Z9181 History of falling: Secondary | ICD-10-CM | POA: Diagnosis not present

## 2019-01-19 DIAGNOSIS — I480 Paroxysmal atrial fibrillation: Secondary | ICD-10-CM | POA: Diagnosis not present

## 2019-02-14 DIAGNOSIS — E1169 Type 2 diabetes mellitus with other specified complication: Secondary | ICD-10-CM | POA: Diagnosis not present

## 2019-02-14 DIAGNOSIS — I69319 Unspecified symptoms and signs involving cognitive functions following cerebral infarction: Secondary | ICD-10-CM | POA: Diagnosis not present

## 2019-02-14 DIAGNOSIS — M5412 Radiculopathy, cervical region: Secondary | ICD-10-CM | POA: Diagnosis not present

## 2019-02-20 DIAGNOSIS — E11319 Type 2 diabetes mellitus with unspecified diabetic retinopathy without macular edema: Secondary | ICD-10-CM | POA: Diagnosis not present

## 2019-02-20 DIAGNOSIS — M5412 Radiculopathy, cervical region: Secondary | ICD-10-CM | POA: Diagnosis not present

## 2019-02-20 DIAGNOSIS — M47816 Spondylosis without myelopathy or radiculopathy, lumbar region: Secondary | ICD-10-CM | POA: Diagnosis not present

## 2019-02-20 DIAGNOSIS — N400 Enlarged prostate without lower urinary tract symptoms: Secondary | ICD-10-CM | POA: Diagnosis not present

## 2019-02-20 DIAGNOSIS — Z9181 History of falling: Secondary | ICD-10-CM | POA: Diagnosis not present

## 2019-02-20 DIAGNOSIS — I2510 Atherosclerotic heart disease of native coronary artery without angina pectoris: Secondary | ICD-10-CM | POA: Diagnosis not present

## 2019-02-20 DIAGNOSIS — I69311 Memory deficit following cerebral infarction: Secondary | ICD-10-CM | POA: Diagnosis not present

## 2019-02-20 DIAGNOSIS — E785 Hyperlipidemia, unspecified: Secondary | ICD-10-CM | POA: Diagnosis not present

## 2019-02-20 DIAGNOSIS — M5126 Other intervertebral disc displacement, lumbar region: Secondary | ICD-10-CM | POA: Diagnosis not present

## 2019-02-20 DIAGNOSIS — I69341 Monoplegia of lower limb following cerebral infarction affecting right dominant side: Secondary | ICD-10-CM | POA: Diagnosis not present

## 2019-02-20 DIAGNOSIS — Z87891 Personal history of nicotine dependence: Secondary | ICD-10-CM | POA: Diagnosis not present

## 2019-02-20 DIAGNOSIS — F0150 Vascular dementia without behavioral disturbance: Secondary | ICD-10-CM | POA: Diagnosis not present

## 2019-02-20 DIAGNOSIS — M48062 Spinal stenosis, lumbar region with neurogenic claudication: Secondary | ICD-10-CM | POA: Diagnosis not present

## 2019-02-20 DIAGNOSIS — I10 Essential (primary) hypertension: Secondary | ICD-10-CM | POA: Diagnosis not present

## 2019-02-20 DIAGNOSIS — I480 Paroxysmal atrial fibrillation: Secondary | ICD-10-CM | POA: Diagnosis not present

## 2019-02-20 DIAGNOSIS — Z7901 Long term (current) use of anticoagulants: Secondary | ICD-10-CM | POA: Diagnosis not present

## 2019-03-17 DIAGNOSIS — M5412 Radiculopathy, cervical region: Secondary | ICD-10-CM | POA: Diagnosis not present

## 2019-03-17 DIAGNOSIS — I69319 Unspecified symptoms and signs involving cognitive functions following cerebral infarction: Secondary | ICD-10-CM | POA: Diagnosis not present

## 2019-03-17 DIAGNOSIS — E1169 Type 2 diabetes mellitus with other specified complication: Secondary | ICD-10-CM | POA: Diagnosis not present

## 2019-03-20 DIAGNOSIS — E785 Hyperlipidemia, unspecified: Secondary | ICD-10-CM | POA: Diagnosis not present

## 2019-03-20 DIAGNOSIS — M47816 Spondylosis without myelopathy or radiculopathy, lumbar region: Secondary | ICD-10-CM | POA: Diagnosis not present

## 2019-03-20 DIAGNOSIS — I10 Essential (primary) hypertension: Secondary | ICD-10-CM | POA: Diagnosis not present

## 2019-03-20 DIAGNOSIS — I2510 Atherosclerotic heart disease of native coronary artery without angina pectoris: Secondary | ICD-10-CM | POA: Diagnosis not present

## 2019-03-20 DIAGNOSIS — M5126 Other intervertebral disc displacement, lumbar region: Secondary | ICD-10-CM | POA: Diagnosis not present

## 2019-03-20 DIAGNOSIS — M5412 Radiculopathy, cervical region: Secondary | ICD-10-CM | POA: Diagnosis not present

## 2019-03-20 DIAGNOSIS — M48062 Spinal stenosis, lumbar region with neurogenic claudication: Secondary | ICD-10-CM | POA: Diagnosis not present

## 2019-03-20 DIAGNOSIS — G309 Alzheimer's disease, unspecified: Secondary | ICD-10-CM | POA: Diagnosis not present

## 2019-03-20 DIAGNOSIS — F0280 Dementia in other diseases classified elsewhere without behavioral disturbance: Secondary | ICD-10-CM | POA: Diagnosis not present

## 2019-03-20 DIAGNOSIS — F0150 Vascular dementia without behavioral disturbance: Secondary | ICD-10-CM | POA: Diagnosis not present

## 2019-03-20 DIAGNOSIS — I69341 Monoplegia of lower limb following cerebral infarction affecting right dominant side: Secondary | ICD-10-CM | POA: Diagnosis not present

## 2019-03-20 DIAGNOSIS — E11319 Type 2 diabetes mellitus with unspecified diabetic retinopathy without macular edema: Secondary | ICD-10-CM | POA: Diagnosis not present

## 2019-03-20 DIAGNOSIS — I480 Paroxysmal atrial fibrillation: Secondary | ICD-10-CM | POA: Diagnosis not present

## 2019-03-20 DIAGNOSIS — I69311 Memory deficit following cerebral infarction: Secondary | ICD-10-CM | POA: Diagnosis not present

## 2019-03-20 DIAGNOSIS — Z9181 History of falling: Secondary | ICD-10-CM | POA: Diagnosis not present

## 2019-03-20 DIAGNOSIS — N400 Enlarged prostate without lower urinary tract symptoms: Secondary | ICD-10-CM | POA: Diagnosis not present

## 2019-04-16 DIAGNOSIS — I69319 Unspecified symptoms and signs involving cognitive functions following cerebral infarction: Secondary | ICD-10-CM | POA: Diagnosis not present

## 2019-04-16 DIAGNOSIS — M5412 Radiculopathy, cervical region: Secondary | ICD-10-CM | POA: Diagnosis not present

## 2019-04-16 DIAGNOSIS — E1169 Type 2 diabetes mellitus with other specified complication: Secondary | ICD-10-CM | POA: Diagnosis not present

## 2019-04-21 DIAGNOSIS — E1169 Type 2 diabetes mellitus with other specified complication: Secondary | ICD-10-CM | POA: Diagnosis not present

## 2019-04-21 DIAGNOSIS — F0390 Unspecified dementia without behavioral disturbance: Secondary | ICD-10-CM | POA: Diagnosis not present

## 2019-04-21 DIAGNOSIS — I25708 Atherosclerosis of coronary artery bypass graft(s), unspecified, with other forms of angina pectoris: Secondary | ICD-10-CM | POA: Diagnosis not present

## 2019-04-21 DIAGNOSIS — I69319 Unspecified symptoms and signs involving cognitive functions following cerebral infarction: Secondary | ICD-10-CM | POA: Diagnosis not present

## 2019-04-28 DIAGNOSIS — I2510 Atherosclerotic heart disease of native coronary artery without angina pectoris: Secondary | ICD-10-CM | POA: Diagnosis not present

## 2019-04-28 DIAGNOSIS — F0280 Dementia in other diseases classified elsewhere without behavioral disturbance: Secondary | ICD-10-CM | POA: Diagnosis not present

## 2019-04-28 DIAGNOSIS — G309 Alzheimer's disease, unspecified: Secondary | ICD-10-CM | POA: Diagnosis not present

## 2019-04-28 DIAGNOSIS — F0150 Vascular dementia without behavioral disturbance: Secondary | ICD-10-CM | POA: Diagnosis not present

## 2019-04-28 DIAGNOSIS — I480 Paroxysmal atrial fibrillation: Secondary | ICD-10-CM | POA: Diagnosis not present

## 2019-04-28 DIAGNOSIS — E785 Hyperlipidemia, unspecified: Secondary | ICD-10-CM | POA: Diagnosis not present

## 2019-04-28 DIAGNOSIS — E11319 Type 2 diabetes mellitus with unspecified diabetic retinopathy without macular edema: Secondary | ICD-10-CM | POA: Diagnosis not present

## 2019-04-28 DIAGNOSIS — I69311 Memory deficit following cerebral infarction: Secondary | ICD-10-CM | POA: Diagnosis not present

## 2019-04-28 DIAGNOSIS — M5412 Radiculopathy, cervical region: Secondary | ICD-10-CM | POA: Diagnosis not present

## 2019-04-28 DIAGNOSIS — I69341 Monoplegia of lower limb following cerebral infarction affecting right dominant side: Secondary | ICD-10-CM | POA: Diagnosis not present

## 2019-04-28 DIAGNOSIS — M5126 Other intervertebral disc displacement, lumbar region: Secondary | ICD-10-CM | POA: Diagnosis not present

## 2019-04-28 DIAGNOSIS — N400 Enlarged prostate without lower urinary tract symptoms: Secondary | ICD-10-CM | POA: Diagnosis not present

## 2019-04-28 DIAGNOSIS — M48062 Spinal stenosis, lumbar region with neurogenic claudication: Secondary | ICD-10-CM | POA: Diagnosis not present

## 2019-04-28 DIAGNOSIS — Z9181 History of falling: Secondary | ICD-10-CM | POA: Diagnosis not present

## 2019-04-28 DIAGNOSIS — I10 Essential (primary) hypertension: Secondary | ICD-10-CM | POA: Diagnosis not present

## 2019-04-28 DIAGNOSIS — M47816 Spondylosis without myelopathy or radiculopathy, lumbar region: Secondary | ICD-10-CM | POA: Diagnosis not present

## 2019-05-17 DIAGNOSIS — I69319 Unspecified symptoms and signs involving cognitive functions following cerebral infarction: Secondary | ICD-10-CM | POA: Diagnosis not present

## 2019-05-17 DIAGNOSIS — M5412 Radiculopathy, cervical region: Secondary | ICD-10-CM | POA: Diagnosis not present

## 2019-05-17 DIAGNOSIS — E1169 Type 2 diabetes mellitus with other specified complication: Secondary | ICD-10-CM | POA: Diagnosis not present

## 2019-05-19 DIAGNOSIS — I69311 Memory deficit following cerebral infarction: Secondary | ICD-10-CM | POA: Diagnosis not present

## 2019-05-19 DIAGNOSIS — I10 Essential (primary) hypertension: Secondary | ICD-10-CM | POA: Diagnosis not present

## 2019-05-19 DIAGNOSIS — M48062 Spinal stenosis, lumbar region with neurogenic claudication: Secondary | ICD-10-CM | POA: Diagnosis not present

## 2019-05-19 DIAGNOSIS — M5126 Other intervertebral disc displacement, lumbar region: Secondary | ICD-10-CM | POA: Diagnosis not present

## 2019-05-19 DIAGNOSIS — I2510 Atherosclerotic heart disease of native coronary artery without angina pectoris: Secondary | ICD-10-CM | POA: Diagnosis not present

## 2019-05-19 DIAGNOSIS — G309 Alzheimer's disease, unspecified: Secondary | ICD-10-CM | POA: Diagnosis not present

## 2019-05-19 DIAGNOSIS — F0280 Dementia in other diseases classified elsewhere without behavioral disturbance: Secondary | ICD-10-CM | POA: Diagnosis not present

## 2019-05-19 DIAGNOSIS — I69341 Monoplegia of lower limb following cerebral infarction affecting right dominant side: Secondary | ICD-10-CM | POA: Diagnosis not present

## 2019-05-19 DIAGNOSIS — Z87891 Personal history of nicotine dependence: Secondary | ICD-10-CM | POA: Diagnosis not present

## 2019-05-19 DIAGNOSIS — E11319 Type 2 diabetes mellitus with unspecified diabetic retinopathy without macular edema: Secondary | ICD-10-CM | POA: Diagnosis not present

## 2019-05-19 DIAGNOSIS — I480 Paroxysmal atrial fibrillation: Secondary | ICD-10-CM | POA: Diagnosis not present

## 2019-05-19 DIAGNOSIS — E785 Hyperlipidemia, unspecified: Secondary | ICD-10-CM | POA: Diagnosis not present

## 2019-05-19 DIAGNOSIS — M47816 Spondylosis without myelopathy or radiculopathy, lumbar region: Secondary | ICD-10-CM | POA: Diagnosis not present

## 2019-05-19 DIAGNOSIS — N400 Enlarged prostate without lower urinary tract symptoms: Secondary | ICD-10-CM | POA: Diagnosis not present

## 2019-05-19 DIAGNOSIS — F0150 Vascular dementia without behavioral disturbance: Secondary | ICD-10-CM | POA: Diagnosis not present

## 2019-05-19 DIAGNOSIS — M5412 Radiculopathy, cervical region: Secondary | ICD-10-CM | POA: Diagnosis not present

## 2019-06-17 DIAGNOSIS — M5412 Radiculopathy, cervical region: Secondary | ICD-10-CM | POA: Diagnosis not present

## 2019-06-17 DIAGNOSIS — I69319 Unspecified symptoms and signs involving cognitive functions following cerebral infarction: Secondary | ICD-10-CM | POA: Diagnosis not present

## 2019-06-17 DIAGNOSIS — E1169 Type 2 diabetes mellitus with other specified complication: Secondary | ICD-10-CM | POA: Diagnosis not present

## 2019-06-22 DIAGNOSIS — I69311 Memory deficit following cerebral infarction: Secondary | ICD-10-CM | POA: Diagnosis not present

## 2019-06-22 DIAGNOSIS — E11319 Type 2 diabetes mellitus with unspecified diabetic retinopathy without macular edema: Secondary | ICD-10-CM | POA: Diagnosis not present

## 2019-06-22 DIAGNOSIS — M5126 Other intervertebral disc displacement, lumbar region: Secondary | ICD-10-CM | POA: Diagnosis not present

## 2019-06-22 DIAGNOSIS — G309 Alzheimer's disease, unspecified: Secondary | ICD-10-CM | POA: Diagnosis not present

## 2019-06-22 DIAGNOSIS — I480 Paroxysmal atrial fibrillation: Secondary | ICD-10-CM | POA: Diagnosis not present

## 2019-06-22 DIAGNOSIS — I10 Essential (primary) hypertension: Secondary | ICD-10-CM | POA: Diagnosis not present

## 2019-06-22 DIAGNOSIS — I69341 Monoplegia of lower limb following cerebral infarction affecting right dominant side: Secondary | ICD-10-CM | POA: Diagnosis not present

## 2019-06-22 DIAGNOSIS — E785 Hyperlipidemia, unspecified: Secondary | ICD-10-CM | POA: Diagnosis not present

## 2019-06-22 DIAGNOSIS — M48062 Spinal stenosis, lumbar region with neurogenic claudication: Secondary | ICD-10-CM | POA: Diagnosis not present

## 2019-06-22 DIAGNOSIS — I2510 Atherosclerotic heart disease of native coronary artery without angina pectoris: Secondary | ICD-10-CM | POA: Diagnosis not present

## 2019-06-22 DIAGNOSIS — F0280 Dementia in other diseases classified elsewhere without behavioral disturbance: Secondary | ICD-10-CM | POA: Diagnosis not present

## 2019-06-22 DIAGNOSIS — N400 Enlarged prostate without lower urinary tract symptoms: Secondary | ICD-10-CM | POA: Diagnosis not present

## 2019-06-22 DIAGNOSIS — M47816 Spondylosis without myelopathy or radiculopathy, lumbar region: Secondary | ICD-10-CM | POA: Diagnosis not present

## 2019-06-22 DIAGNOSIS — M5412 Radiculopathy, cervical region: Secondary | ICD-10-CM | POA: Diagnosis not present

## 2019-06-22 DIAGNOSIS — F0150 Vascular dementia without behavioral disturbance: Secondary | ICD-10-CM | POA: Diagnosis not present

## 2019-06-22 DIAGNOSIS — Z87891 Personal history of nicotine dependence: Secondary | ICD-10-CM | POA: Diagnosis not present

## 2019-07-07 DIAGNOSIS — Z515 Encounter for palliative care: Secondary | ICD-10-CM | POA: Diagnosis not present

## 2019-07-07 DIAGNOSIS — M545 Low back pain: Secondary | ICD-10-CM | POA: Diagnosis not present

## 2019-07-07 DIAGNOSIS — M48061 Spinal stenosis, lumbar region without neurogenic claudication: Secondary | ICD-10-CM | POA: Diagnosis not present

## 2019-07-07 DIAGNOSIS — I69351 Hemiplegia and hemiparesis following cerebral infarction affecting right dominant side: Secondary | ICD-10-CM | POA: Diagnosis not present

## 2019-07-17 DIAGNOSIS — N400 Enlarged prostate without lower urinary tract symptoms: Secondary | ICD-10-CM | POA: Diagnosis not present

## 2019-07-17 DIAGNOSIS — E1169 Type 2 diabetes mellitus with other specified complication: Secondary | ICD-10-CM | POA: Diagnosis not present

## 2019-07-17 DIAGNOSIS — I10 Essential (primary) hypertension: Secondary | ICD-10-CM | POA: Diagnosis not present

## 2019-07-17 DIAGNOSIS — I2510 Atherosclerotic heart disease of native coronary artery without angina pectoris: Secondary | ICD-10-CM | POA: Diagnosis not present

## 2019-07-17 DIAGNOSIS — I69319 Unspecified symptoms and signs involving cognitive functions following cerebral infarction: Secondary | ICD-10-CM | POA: Diagnosis not present

## 2019-07-17 DIAGNOSIS — M5126 Other intervertebral disc displacement, lumbar region: Secondary | ICD-10-CM | POA: Diagnosis not present

## 2019-07-17 DIAGNOSIS — F0280 Dementia in other diseases classified elsewhere without behavioral disturbance: Secondary | ICD-10-CM | POA: Diagnosis not present

## 2019-07-17 DIAGNOSIS — G309 Alzheimer's disease, unspecified: Secondary | ICD-10-CM | POA: Diagnosis not present

## 2019-07-17 DIAGNOSIS — F0150 Vascular dementia without behavioral disturbance: Secondary | ICD-10-CM | POA: Diagnosis not present

## 2019-07-17 DIAGNOSIS — I480 Paroxysmal atrial fibrillation: Secondary | ICD-10-CM | POA: Diagnosis not present

## 2019-07-17 DIAGNOSIS — E11319 Type 2 diabetes mellitus with unspecified diabetic retinopathy without macular edema: Secondary | ICD-10-CM | POA: Diagnosis not present

## 2019-07-17 DIAGNOSIS — I69341 Monoplegia of lower limb following cerebral infarction affecting right dominant side: Secondary | ICD-10-CM | POA: Diagnosis not present

## 2019-07-17 DIAGNOSIS — M5412 Radiculopathy, cervical region: Secondary | ICD-10-CM | POA: Diagnosis not present

## 2019-07-17 DIAGNOSIS — M48062 Spinal stenosis, lumbar region with neurogenic claudication: Secondary | ICD-10-CM | POA: Diagnosis not present

## 2019-07-17 DIAGNOSIS — M47816 Spondylosis without myelopathy or radiculopathy, lumbar region: Secondary | ICD-10-CM | POA: Diagnosis not present

## 2019-07-17 DIAGNOSIS — E785 Hyperlipidemia, unspecified: Secondary | ICD-10-CM | POA: Diagnosis not present

## 2019-07-17 DIAGNOSIS — I69311 Memory deficit following cerebral infarction: Secondary | ICD-10-CM | POA: Diagnosis not present

## 2019-07-17 DIAGNOSIS — Z7984 Long term (current) use of oral hypoglycemic drugs: Secondary | ICD-10-CM | POA: Diagnosis not present

## 2019-08-15 DIAGNOSIS — M5412 Radiculopathy, cervical region: Secondary | ICD-10-CM | POA: Diagnosis not present

## 2019-08-15 DIAGNOSIS — I10 Essential (primary) hypertension: Secondary | ICD-10-CM | POA: Diagnosis not present

## 2019-08-15 DIAGNOSIS — M5126 Other intervertebral disc displacement, lumbar region: Secondary | ICD-10-CM | POA: Diagnosis not present

## 2019-08-15 DIAGNOSIS — M47816 Spondylosis without myelopathy or radiculopathy, lumbar region: Secondary | ICD-10-CM | POA: Diagnosis not present

## 2019-08-15 DIAGNOSIS — G309 Alzheimer's disease, unspecified: Secondary | ICD-10-CM | POA: Diagnosis not present

## 2019-08-15 DIAGNOSIS — N400 Enlarged prostate without lower urinary tract symptoms: Secondary | ICD-10-CM | POA: Diagnosis not present

## 2019-08-15 DIAGNOSIS — I480 Paroxysmal atrial fibrillation: Secondary | ICD-10-CM | POA: Diagnosis not present

## 2019-08-15 DIAGNOSIS — I69341 Monoplegia of lower limb following cerebral infarction affecting right dominant side: Secondary | ICD-10-CM | POA: Diagnosis not present

## 2019-08-15 DIAGNOSIS — Z7984 Long term (current) use of oral hypoglycemic drugs: Secondary | ICD-10-CM | POA: Diagnosis not present

## 2019-08-15 DIAGNOSIS — M48062 Spinal stenosis, lumbar region with neurogenic claudication: Secondary | ICD-10-CM | POA: Diagnosis not present

## 2019-08-15 DIAGNOSIS — F0150 Vascular dementia without behavioral disturbance: Secondary | ICD-10-CM | POA: Diagnosis not present

## 2019-08-15 DIAGNOSIS — F0280 Dementia in other diseases classified elsewhere without behavioral disturbance: Secondary | ICD-10-CM | POA: Diagnosis not present

## 2019-08-15 DIAGNOSIS — E785 Hyperlipidemia, unspecified: Secondary | ICD-10-CM | POA: Diagnosis not present

## 2019-08-15 DIAGNOSIS — I69311 Memory deficit following cerebral infarction: Secondary | ICD-10-CM | POA: Diagnosis not present

## 2019-08-15 DIAGNOSIS — E11319 Type 2 diabetes mellitus with unspecified diabetic retinopathy without macular edema: Secondary | ICD-10-CM | POA: Diagnosis not present

## 2019-08-15 DIAGNOSIS — I2510 Atherosclerotic heart disease of native coronary artery without angina pectoris: Secondary | ICD-10-CM | POA: Diagnosis not present

## 2019-08-17 DIAGNOSIS — I69319 Unspecified symptoms and signs involving cognitive functions following cerebral infarction: Secondary | ICD-10-CM | POA: Diagnosis not present

## 2019-08-17 DIAGNOSIS — M5412 Radiculopathy, cervical region: Secondary | ICD-10-CM | POA: Diagnosis not present

## 2019-08-17 DIAGNOSIS — E1169 Type 2 diabetes mellitus with other specified complication: Secondary | ICD-10-CM | POA: Diagnosis not present

## 2019-09-16 DIAGNOSIS — I69319 Unspecified symptoms and signs involving cognitive functions following cerebral infarction: Secondary | ICD-10-CM | POA: Diagnosis not present

## 2019-09-16 DIAGNOSIS — M5412 Radiculopathy, cervical region: Secondary | ICD-10-CM | POA: Diagnosis not present

## 2019-09-16 DIAGNOSIS — E1169 Type 2 diabetes mellitus with other specified complication: Secondary | ICD-10-CM | POA: Diagnosis not present

## 2019-10-11 DIAGNOSIS — Z111 Encounter for screening for respiratory tuberculosis: Secondary | ICD-10-CM | POA: Diagnosis not present

## 2019-10-11 DIAGNOSIS — Z20828 Contact with and (suspected) exposure to other viral communicable diseases: Secondary | ICD-10-CM | POA: Diagnosis not present

## 2019-10-17 DIAGNOSIS — I69319 Unspecified symptoms and signs involving cognitive functions following cerebral infarction: Secondary | ICD-10-CM | POA: Diagnosis not present

## 2019-10-17 DIAGNOSIS — M5412 Radiculopathy, cervical region: Secondary | ICD-10-CM | POA: Diagnosis not present

## 2019-10-17 DIAGNOSIS — E1169 Type 2 diabetes mellitus with other specified complication: Secondary | ICD-10-CM | POA: Diagnosis not present

## 2019-10-23 DIAGNOSIS — Z20828 Contact with and (suspected) exposure to other viral communicable diseases: Secondary | ICD-10-CM | POA: Diagnosis not present

## 2019-10-25 DIAGNOSIS — I2510 Atherosclerotic heart disease of native coronary artery without angina pectoris: Secondary | ICD-10-CM | POA: Diagnosis not present

## 2019-10-25 DIAGNOSIS — E119 Type 2 diabetes mellitus without complications: Secondary | ICD-10-CM | POA: Diagnosis not present

## 2019-10-26 DIAGNOSIS — R6889 Other general symptoms and signs: Secondary | ICD-10-CM | POA: Diagnosis not present

## 2019-10-26 DIAGNOSIS — D519 Vitamin B12 deficiency anemia, unspecified: Secondary | ICD-10-CM | POA: Diagnosis not present

## 2019-10-26 DIAGNOSIS — D649 Anemia, unspecified: Secondary | ICD-10-CM | POA: Diagnosis not present

## 2019-10-26 DIAGNOSIS — E119 Type 2 diabetes mellitus without complications: Secondary | ICD-10-CM | POA: Diagnosis not present

## 2019-10-31 ENCOUNTER — Non-Acute Institutional Stay: Payer: Medicare Other | Admitting: Hospice

## 2019-10-31 ENCOUNTER — Other Ambulatory Visit: Payer: Self-pay

## 2019-10-31 DIAGNOSIS — F0150 Vascular dementia without behavioral disturbance: Secondary | ICD-10-CM

## 2019-10-31 DIAGNOSIS — Z515 Encounter for palliative care: Secondary | ICD-10-CM

## 2019-10-31 NOTE — Progress Notes (Addendum)
[facility] Consult Note Telephone: [phone]  Fax: [phone]  PATIENT NAME: Jonathan [name] DOB: [birth year] MRN: [id number]  PRIMARY CARE PROVIDER:   Lavone Orn, MD  REFERRING PROVIDER:  Ferd Hibbs, NP  RESPONSIBLE PARTY:  Jazael, Sanmiguel [id number]    RECOMMENDATIONS/PLAN:   Advance Care Planning/Goals of Care: Visit consisted of building trust and discussions on Palliative Medicine as specialized medical care for people living with serious illness, aimed at facilitating better quality of life through symptoms relief, assisting with advance care plan and establishing goals of care. Patient is a DNR; uploaded in [facility]. Son provided good detailed history of patient's medical history, highlight of which was a stroke in 2018 and subsequent TIAs that have led to his cognitive and functional decline; patient desires not to prolong life. MOST form was discussed and he elected do not attempt resuscitation, comfort care, no feeding tube, IV/antibiotics as determined by provider. Facility Nursing supervisor will liaise with him to sign his part. Goals of care include to maximize quality of life and symptom management; no hospitalization.  Symptom management: Ongoing memory loss related to Dementia progressing in line with disease trajectory. Dementia 6c. Fatigue related to low Hgb 6.3 10/26/2019. Patient refused to go to the hospital for infusion. Patient currently on Ferrous Sulphate Vit C, Med pass. He said he is not as weak as he was last weak; denied pain/discomfort. No report of SOB, palpitations, bleeding from nursing staff. Type 2 DM well managed with Glipizide, current A1c is 7.5. Patient has an order for PT/OT/ST eval and treat. Encouraged current supportive nursing. Follow up: Palliative care will continue to follow patient for goals of care clarification and symptom management. I spent 90 minutes providing this consultation.   More than 50% of the time in this consultation was spent on coordinating communication  HISTORY OF PRESENT ILLNESS:  Jonathan [name] is a 86 y.o. year old male with multiple medical problems including CVA, BPH, CAD, Dementia, Type 2 DM HTN. Palliative Care was asked to help address goals of care.   CODE STATUS: DNR  PPS: 50% HOSPICE ELIGIBILITY/DIAGNOSIS: TBD  PAST MEDICAL HISTORY:  Past Medical History:  Diagnosis Date  . Allergic rhinitis   . BPH (benign prostatic hypertrophy)   . Coronary atherosclerosis of native coronary artery   . Degeneration of lumbar or lumbosacral intervertebral disc   . Diabetes ([place])    no meds  . Dyslipidemia   . HOH (hard of hearing)   . Hyperlipidemia   . Hypertension   . Right leg weakness   . S/P CABG x 4 2006   LIMA-LAD, SVG-rPDA, sSVG-OM1-OM2  . Stroke ([place])   . Wears glasses    reading    SOCIAL HX:  Social History   Tobacco Use  . Smoking status: Former Research scientist (life sciences)  . Smokeless tobacco: Never Used  . Tobacco comment: Quit 1958  Substance Use Topics  . Alcohol use: No    Alcohol/week: 0.0 standard drinks    ALLERGIES:  Allergies  Allergen Reactions  . Amlodipine Other (See Comments)    Fatigue   . Codeine     unknown  . Metformin And Related Other (See Comments)    fatigue  . Statins     "pass out, dizzy, drunk feeling, poor balance"  . Welchol [Colesevelam Hcl]      PERTINENT MEDICATIONS:  Outpatient Encounter Medications as of 10/31/2019  Medication Sig  . apixaban (ELIQUIS) 5 MG  TABS tablet Take 1 tablet (5 mg total) by mouth 2 (two) times daily.  Marland Kitchen glipiZIDE (GLUCOTROL) 5 MG tablet Take 1 tablet (5 mg total) by mouth daily before breakfast.  . isosorbide mononitrate (IMDUR) 30 MG 24 hr tablet Take 0.5 tablets (15 mg total) by mouth daily.  Marland Kitchen linaclotide (LINZESS) 290 MCG CAPS capsule Take 1 capsule (290 mcg total) by mouth daily before breakfast.  . pravastatin (PRAVACHOL) 20 MG tablet Take 1 tablet (20 mg total) by  mouth daily at 6 PM.   No facility-administered encounter medications on file as of 10/31/2019.    PHYSICAL EXAM/ROS:   General: cooperative, NAD Cardiovascular: regular rate and rhythm Pulmonary: clear ant fields Abdomen: soft, nontender, + bowel sounds GU: no suprapubic tenderness Extremities: no edema, no joint deformities Skin: no rashes on exposed skin Neurological: Weakness but otherwise nonfocal  Teodoro Spray, NP

## 2019-11-03 DIAGNOSIS — F329 Major depressive disorder, single episode, unspecified: Secondary | ICD-10-CM | POA: Diagnosis not present

## 2019-11-03 DIAGNOSIS — M48062 Spinal stenosis, lumbar region with neurogenic claudication: Secondary | ICD-10-CM | POA: Diagnosis not present

## 2019-11-03 DIAGNOSIS — I10 Essential (primary) hypertension: Secondary | ICD-10-CM | POA: Diagnosis not present

## 2019-11-03 DIAGNOSIS — Z951 Presence of aortocoronary bypass graft: Secondary | ICD-10-CM | POA: Diagnosis not present

## 2019-11-03 DIAGNOSIS — Z7984 Long term (current) use of oral hypoglycemic drugs: Secondary | ICD-10-CM | POA: Diagnosis not present

## 2019-11-03 DIAGNOSIS — N39498 Other specified urinary incontinence: Secondary | ICD-10-CM | POA: Diagnosis not present

## 2019-11-03 DIAGNOSIS — I480 Paroxysmal atrial fibrillation: Secondary | ICD-10-CM | POA: Diagnosis not present

## 2019-11-03 DIAGNOSIS — N401 Enlarged prostate with lower urinary tract symptoms: Secondary | ICD-10-CM | POA: Diagnosis not present

## 2019-11-03 DIAGNOSIS — Z7901 Long term (current) use of anticoagulants: Secondary | ICD-10-CM | POA: Diagnosis not present

## 2019-11-03 DIAGNOSIS — M47816 Spondylosis without myelopathy or radiculopathy, lumbar region: Secondary | ICD-10-CM | POA: Diagnosis not present

## 2019-11-03 DIAGNOSIS — I69318 Other symptoms and signs involving cognitive functions following cerebral infarction: Secondary | ICD-10-CM | POA: Diagnosis not present

## 2019-11-03 DIAGNOSIS — E119 Type 2 diabetes mellitus without complications: Secondary | ICD-10-CM | POA: Diagnosis not present

## 2019-11-03 DIAGNOSIS — D649 Anemia, unspecified: Secondary | ICD-10-CM | POA: Diagnosis not present

## 2019-11-03 DIAGNOSIS — M5412 Radiculopathy, cervical region: Secondary | ICD-10-CM | POA: Diagnosis not present

## 2019-11-03 DIAGNOSIS — F0150 Vascular dementia without behavioral disturbance: Secondary | ICD-10-CM | POA: Diagnosis not present

## 2019-11-03 DIAGNOSIS — I2510 Atherosclerotic heart disease of native coronary artery without angina pectoris: Secondary | ICD-10-CM | POA: Diagnosis not present

## 2019-11-14 DIAGNOSIS — B351 Tinea unguium: Secondary | ICD-10-CM | POA: Diagnosis not present

## 2019-11-14 DIAGNOSIS — E1140 Type 2 diabetes mellitus with diabetic neuropathy, unspecified: Secondary | ICD-10-CM | POA: Diagnosis not present

## 2019-11-14 DIAGNOSIS — M79673 Pain in unspecified foot: Secondary | ICD-10-CM | POA: Diagnosis not present

## 2019-11-14 DIAGNOSIS — L851 Acquired keratosis [keratoderma] palmaris et plantaris: Secondary | ICD-10-CM | POA: Diagnosis not present

## 2019-11-16 DIAGNOSIS — Z1159 Encounter for screening for other viral diseases: Secondary | ICD-10-CM | POA: Diagnosis not present

## 2019-11-16 DIAGNOSIS — D519 Vitamin B12 deficiency anemia, unspecified: Secondary | ICD-10-CM | POA: Diagnosis not present

## 2019-11-16 DIAGNOSIS — D649 Anemia, unspecified: Secondary | ICD-10-CM | POA: Diagnosis not present

## 2019-11-16 DIAGNOSIS — R6889 Other general symptoms and signs: Secondary | ICD-10-CM | POA: Diagnosis not present

## 2019-11-17 DIAGNOSIS — I69319 Unspecified symptoms and signs involving cognitive functions following cerebral infarction: Secondary | ICD-10-CM | POA: Diagnosis not present

## 2019-11-17 DIAGNOSIS — M5412 Radiculopathy, cervical region: Secondary | ICD-10-CM | POA: Diagnosis not present

## 2019-11-17 DIAGNOSIS — E1169 Type 2 diabetes mellitus with other specified complication: Secondary | ICD-10-CM | POA: Diagnosis not present

## 2019-11-22 DIAGNOSIS — D649 Anemia, unspecified: Secondary | ICD-10-CM | POA: Diagnosis not present

## 2020-01-15 DIAGNOSIS — B351 Tinea unguium: Secondary | ICD-10-CM | POA: Diagnosis not present

## 2020-01-15 DIAGNOSIS — M79673 Pain in unspecified foot: Secondary | ICD-10-CM | POA: Diagnosis not present

## 2020-01-15 DIAGNOSIS — L605 Yellow nail syndrome: Secondary | ICD-10-CM | POA: Diagnosis not present

## 2020-01-15 DIAGNOSIS — F0390 Unspecified dementia without behavioral disturbance: Secondary | ICD-10-CM | POA: Diagnosis not present

## 2020-02-21 ENCOUNTER — Non-Acute Institutional Stay: Payer: Medicare Other | Admitting: Hospice

## 2020-02-21 ENCOUNTER — Other Ambulatory Visit: Payer: Self-pay

## 2020-02-21 DIAGNOSIS — Z515 Encounter for palliative care: Secondary | ICD-10-CM

## 2020-02-21 DIAGNOSIS — F0150 Vascular dementia without behavioral disturbance: Secondary | ICD-10-CM

## 2020-02-21 NOTE — Progress Notes (Signed)
[facility] Consult Note Telephone: [phone]  Fax: [phone]  PATIENT NAME: Jonathan [name] DOB: July 21, 1929 MRN: [id number] PRIMARY CARE PROVIDER:   Dr. Melodie Bouillon PROVIDER:  Ferd Hibbs, NP  RESPONSIBLE PARTY:  Marvins, Starratt [id number]   RECOMMENDATIONS/PLAN:   Advance Care Planning/Goals of Care: Patient is a DNR;  MOST form selections include do not attempt resuscitate, comfort care, no feeding tube, IV/antibiotics as determined by provider.  Goals of care include to maximize quality of life and symptom management; no hospitalization.   NP called son and updated him on the visit.  He expressed appreciation for the call/update.  Son shared that he was informed by facility staff that since patient's wife moved to the facility, patient has been more social and feeling better than before.  Symptom management:  Patient in no acute distress; no report of medical acuity.  Ongoing memory loss related to Dementia progressing in line with disease trajectory. Dementia 6c. Fatigue related to low chronic anemia. Patient refused to go to the hospital for infusion. Patient currently on Ferrous Sulphate Vit C, Med pass. He endorsed chronic weakness but said he is still able to get around; he denied pain/discomfort. No report of SOB, palpitations, no bleeding from nursing staff. Type 2 DM well managed with Glipizide, current A1c is 7.5. He shared his wife is now in same facility and that has gladdened his heart to see her daily.  Nursing with no concerns at this time.  Spoke with her PCP who reported no medical acuity.  Encouraged current supportive nursing. Follow up: Palliative care will continue to follow patient for goals of care clarification and symptom management. I spent 45 minutes providing this consultation.  More than 50% of the time in this consultation was spent on coordinating communication  HISTORY OF PRESENT ILLNESS:   Jonathan [name] is a 86 y.o. year old male with multiple medical problems including CVA, BPH, CAD, Dementia, Type 2 DM HTN. Palliative Care was asked to help address goals of care.   CODE STATUS: DNR  PPS: 50%   HOSPICE ELIGIBILITY/DIAGNOSIS: TBD  PAST MEDICAL HISTORY:  Past Medical History:  Diagnosis Date  . Allergic rhinitis   . BPH (benign prostatic hypertrophy)   . Coronary atherosclerosis of native coronary artery   . Degeneration of lumbar or lumbosacral intervertebral disc   . Diabetes ([place])    no meds  . Dyslipidemia   . HOH (hard of hearing)   . Hyperlipidemia   . Hypertension   . Right leg weakness   . S/P CABG x 4 2006   LIMA-LAD, SVG-rPDA, sSVG-OM1-OM2  . Stroke ([place])   . Wears glasses    reading    SOCIAL HX:  Social History   Tobacco Use  . Smoking status: Former Research scientist (life sciences)  . Smokeless tobacco: Never Used  . Tobacco comment: Quit 1958  Substance Use Topics  . Alcohol use: No    Alcohol/week: 0.0 standard drinks    ALLERGIES:  Allergies  Allergen Reactions  . Amlodipine Other (See Comments)    Fatigue   . Codeine     unknown  . Metformin And Related Other (See Comments)    fatigue  . Statins     "pass out, dizzy, drunk feeling, poor balance"  . Welchol [Colesevelam Hcl]      PERTINENT MEDICATIONS:  Outpatient Encounter Medications as of 02/21/2020  Medication Sig  . apixaban (ELIQUIS) 5 MG TABS tablet Take  1 tablet (5 mg total) by mouth 2 (two) times daily.  Marland Kitchen glipiZIDE (GLUCOTROL) 5 MG tablet Take 1 tablet (5 mg total) by mouth daily before breakfast.  . isosorbide mononitrate (IMDUR) 30 MG 24 hr tablet Take 0.5 tablets (15 mg total) by mouth daily.  Marland Kitchen linaclotide (LINZESS) 290 MCG CAPS capsule Take 1 capsule (290 mcg total) by mouth daily before breakfast.  . pravastatin (PRAVACHOL) 20 MG tablet Take 1 tablet (20 mg total) by mouth daily at 6 PM.   No facility-administered encounter medications on file as of 02/21/2020.    PHYSICAL  EXAM/ROS:  General: NAD, cooperative Cardiovascular: regular rate and rhythm Pulmonary: clear ant/post fields; normal respiratory effort, no coughing, no shortness of breath Abdomen: soft, nontender, + bowel sounds GU: no suprapubic tenderness Extremities: no edema, no joint deformities Skin: no rashes to exposed skin Neurological: Weakness but otherwise nonfocal  Teodoro Spray, NP

## 2020-03-18 DIAGNOSIS — E1140 Type 2 diabetes mellitus with diabetic neuropathy, unspecified: Secondary | ICD-10-CM | POA: Diagnosis not present

## 2020-03-18 DIAGNOSIS — M79673 Pain in unspecified foot: Secondary | ICD-10-CM | POA: Diagnosis not present

## 2020-03-18 DIAGNOSIS — L603 Nail dystrophy: Secondary | ICD-10-CM | POA: Diagnosis not present

## 2020-03-18 DIAGNOSIS — B351 Tinea unguium: Secondary | ICD-10-CM | POA: Diagnosis not present

## 2020-05-20 DIAGNOSIS — F0390 Unspecified dementia without behavioral disturbance: Secondary | ICD-10-CM | POA: Diagnosis not present

## 2020-05-20 DIAGNOSIS — B351 Tinea unguium: Secondary | ICD-10-CM | POA: Diagnosis not present

## 2020-05-20 DIAGNOSIS — L605 Yellow nail syndrome: Secondary | ICD-10-CM | POA: Diagnosis not present

## 2020-05-20 DIAGNOSIS — M79673 Pain in unspecified foot: Secondary | ICD-10-CM | POA: Diagnosis not present

## 2020-05-31 ENCOUNTER — Other Ambulatory Visit: Payer: Self-pay

## 2020-05-31 ENCOUNTER — Non-Acute Institutional Stay: Payer: Medicare Other | Admitting: Hospice

## 2020-05-31 DIAGNOSIS — F0150 Vascular dementia without behavioral disturbance: Secondary | ICD-10-CM

## 2020-05-31 DIAGNOSIS — Z515 Encounter for palliative care: Secondary | ICD-10-CM | POA: Diagnosis not present

## 2020-05-31 NOTE — Progress Notes (Signed)
[facility] Consult Note Telephone: [phone]  Fax: [phone]  PATIENT NAME: Jonathan [name] DOB: 18-Oct-1928 MRN: [id number]  PRIMARY CARE PROVIDER:   Dr. Arelia Sneddon REFERRING Enid Baas, NP  RESPONSIBLE PARTY:Son, Kaveon Blatz [phone]   RECOMMENDATIONS/PLAN:  Advance Care Planning/Goals of Care: Visit is to build trust and checkup on palliative care. CODE STATUS: Patient is a DO NOT RESUSCITATE. Goals of care:Goals of care include to maximize quality of life and symptom management; no hospitalization. MOST form selections include do not attempt resuscitate, comfort care, no feeding tube, IV/antibiotics as determined by provider. Signed MOST form in facility chart; the same document uploaded to epic today. Follow GY:IRSWNIOEVO care will continue to follow patient for goals of care clarification and symptom management.  Follow-up in 2 months. Symptom management: Patient is stable, in no acute distress; no report of medical acuity.  Ongoing memory loss related to Dementia progressing in line with disease trajectory. Dementia 6c. Fatigue related to low chronic anemia continues to be managed with Ferrous Sulphate Vit C, Med pass; effective he denied pain/discomfort. No report of SOB, palpitations, no bleeding from nursing staff. Type 2 DM well managed with Glipizide, current A1c is 7.4 January' 21.   Patient pleasant and humorous during visit.  Nursing staff reports patient more interactive with other inmates. Nursing with no concerns at this time. Encouraged current supportive nursing.  I spent 46 minutes providing this consultation; time includes chart review and documentation. More than 50% of the time in this consultation was spent on coordinating communication  [name] a 86 y.o.year oldmalewith multiple medical problems including CVA,BPH, CAD, Dementia, Type 2  DM HTN. Palliative Care was asked to help address goals of care.   CODE STATUS:DNR  PPS:50% HOSPICE ELIGIBILITY/DIAGNOSIS: TBD  PAST MEDICAL HISTORY:  Past Medical History:  Diagnosis Date  . Allergic rhinitis   . BPH (benign prostatic hypertrophy)   . Coronary atherosclerosis of native coronary artery   . Degeneration of lumbar or lumbosacral intervertebral disc   . Diabetes ([place])    no meds  . Dyslipidemia   . HOH (hard of hearing)   . Hyperlipidemia   . Hypertension   . Right leg weakness   . S/P CABG x 4 2006   LIMA-LAD, SVG-rPDA, sSVG-OM1-OM2  . Stroke ([place])   . Wears glasses    reading    SOCIAL HX:  Social History   Tobacco Use  . Smoking status: Former Research scientist (life sciences)  . Smokeless tobacco: Never Used  . Tobacco comment: Quit 1958  Substance Use Topics  . Alcohol use: No    Alcohol/week: 0.0 standard drinks    ALLERGIES:  Allergies  Allergen Reactions  . Amlodipine Other (See Comments)    Fatigue   . Codeine     unknown  . Metformin And Related Other (See Comments)    fatigue  . Statins     "pass out, dizzy, drunk feeling, poor balance"  . Welchol [Colesevelam Hcl]      PERTINENT MEDICATIONS:  Outpatient Encounter Medications as of 05/31/2020  Medication Sig  . apixaban (ELIQUIS) 5 MG TABS tablet Take 1 tablet (5 mg total) by mouth 2 (two) times daily.  Marland Kitchen glipiZIDE (GLUCOTROL) 5 MG tablet Take 1 tablet (5 mg total) by mouth daily before breakfast.  . isosorbide mononitrate (IMDUR) 30 MG 24 hr tablet Take 0.5 tablets (15 mg total) by mouth daily.  Marland Kitchen linaclotide (LINZESS) 290 MCG  CAPS capsule Take 1 capsule (290 mcg total) by mouth daily before breakfast.  . pravastatin (PRAVACHOL) 20 MG tablet Take 1 tablet (20 mg total) by mouth daily at 6 PM.   No facility-administered encounter medications on file as of 05/31/2020.    PHYSICAL EXAM/ROS:  General: NAD, cooperative Cardiovascular: regular rate and rhythm Pulmonary: clear ant/post fields; normal  respiratory effort, no coughing, no shortness of breath Abdomen: soft, nontender, + bowel sounds GU: no suprapubic tenderness Extremities: no edema, no joint deformities Skin: no rashes to exposed skin Neurological: Weakness but otherwise nonfocal  Teodoro Spray, NP

## 2020-07-22 DIAGNOSIS — M79673 Pain in unspecified foot: Secondary | ICD-10-CM | POA: Diagnosis not present

## 2020-07-22 DIAGNOSIS — E1140 Type 2 diabetes mellitus with diabetic neuropathy, unspecified: Secondary | ICD-10-CM | POA: Diagnosis not present

## 2020-07-22 DIAGNOSIS — B351 Tinea unguium: Secondary | ICD-10-CM | POA: Diagnosis not present

## 2020-07-22 DIAGNOSIS — L851 Acquired keratosis [keratoderma] palmaris et plantaris: Secondary | ICD-10-CM | POA: Diagnosis not present

## 2020-07-27 ENCOUNTER — Encounter (HOSPITAL_COMMUNITY): Payer: Self-pay

## 2020-07-27 ENCOUNTER — Emergency Department (HOSPITAL_COMMUNITY)
Admission: EM | Admit: 2020-07-27 | Discharge: 2020-07-27 | Disposition: A | Discharge status: Home / Self Care | Payer: Medicare Other | Attending: Emergency Medicine | Admitting: Emergency Medicine

## 2020-07-27 ENCOUNTER — Emergency Department (HOSPITAL_COMMUNITY): Payer: Medicare Other

## 2020-07-27 ENCOUNTER — Other Ambulatory Visit: Payer: Self-pay

## 2020-07-27 DIAGNOSIS — Z7901 Long term (current) use of anticoagulants: Secondary | ICD-10-CM | POA: Insufficient documentation

## 2020-07-27 DIAGNOSIS — D1779 Benign lipomatous neoplasm of other sites: Secondary | ICD-10-CM | POA: Diagnosis not present

## 2020-07-27 DIAGNOSIS — S199XXA Unspecified injury of neck, initial encounter: Secondary | ICD-10-CM | POA: Diagnosis not present

## 2020-07-27 DIAGNOSIS — F0390 Unspecified dementia without behavioral disturbance: Secondary | ICD-10-CM | POA: Insufficient documentation

## 2020-07-27 DIAGNOSIS — M25561 Pain in right knee: Secondary | ICD-10-CM | POA: Insufficient documentation

## 2020-07-27 DIAGNOSIS — Z79899 Other long term (current) drug therapy: Secondary | ICD-10-CM | POA: Insufficient documentation

## 2020-07-27 DIAGNOSIS — R4182 Altered mental status, unspecified: Secondary | ICD-10-CM | POA: Diagnosis not present

## 2020-07-27 DIAGNOSIS — I2510 Atherosclerotic heart disease of native coronary artery without angina pectoris: Secondary | ICD-10-CM | POA: Diagnosis not present

## 2020-07-27 DIAGNOSIS — Z7984 Long term (current) use of oral hypoglycemic drugs: Secondary | ICD-10-CM | POA: Insufficient documentation

## 2020-07-27 DIAGNOSIS — Z951 Presence of aortocoronary bypass graft: Secondary | ICD-10-CM | POA: Diagnosis not present

## 2020-07-27 DIAGNOSIS — Z87891 Personal history of nicotine dependence: Secondary | ICD-10-CM | POA: Diagnosis not present

## 2020-07-27 DIAGNOSIS — W19XXXA Unspecified fall, initial encounter: Secondary | ICD-10-CM | POA: Diagnosis not present

## 2020-07-27 DIAGNOSIS — E119 Type 2 diabetes mellitus without complications: Secondary | ICD-10-CM | POA: Diagnosis not present

## 2020-07-27 DIAGNOSIS — I6389 Other cerebral infarction: Secondary | ICD-10-CM | POA: Diagnosis not present

## 2020-07-27 DIAGNOSIS — R0902 Hypoxemia: Secondary | ICD-10-CM | POA: Diagnosis not present

## 2020-07-27 DIAGNOSIS — E1165 Type 2 diabetes mellitus with hyperglycemia: Secondary | ICD-10-CM | POA: Diagnosis not present

## 2020-07-27 DIAGNOSIS — I119 Hypertensive heart disease without heart failure: Secondary | ICD-10-CM | POA: Insufficient documentation

## 2020-07-27 DIAGNOSIS — I959 Hypotension, unspecified: Secondary | ICD-10-CM | POA: Diagnosis not present

## 2020-07-27 DIAGNOSIS — I6782 Cerebral ischemia: Secondary | ICD-10-CM | POA: Diagnosis not present

## 2020-07-27 DIAGNOSIS — Z743 Need for continuous supervision: Secondary | ICD-10-CM | POA: Diagnosis not present

## 2020-07-27 DIAGNOSIS — R279 Unspecified lack of coordination: Secondary | ICD-10-CM | POA: Diagnosis not present

## 2020-07-27 DIAGNOSIS — R52 Pain, unspecified: Secondary | ICD-10-CM | POA: Diagnosis not present

## 2020-07-27 NOTE — Discharge Instructions (Addendum)
X-ray of the right knee does not show acute injury. CT head and c-spine without acute injury. Follow up with PCP if right knee pain continues.

## 2020-07-27 NOTE — ED Provider Notes (Signed)
[facility] DEPT Provider Note   CSN: [id number] Arrival date & time: 07/27/20  1806     History Chief Complaint  Patient presents with  . Fall    Jonathan [name] is a 86 y.o. male.  85 year old male brought in by EMS from nursing facility for right knee pain. Per EMS report, patient fell this morning at 7AM, reports right knee pain. Patient has a history of dementia, does not recall falling, states his right knee hurts, no other injuries or complaints today. Patient is on Eliquis.         Past Medical History:  Diagnosis Date  . Allergic rhinitis   . BPH (benign prostatic hypertrophy)   . Coronary atherosclerosis of native coronary artery   . Degeneration of lumbar or lumbosacral intervertebral disc   . Diabetes ([place])    no meds  . Dyslipidemia   . HOH (hard of hearing)   . Hyperlipidemia   . Hypertension   . Right leg weakness   . S/P CABG x 4 2006   LIMA-LAD, SVG-rPDA, sSVG-OM1-OM2  . Stroke ([place])   . Wears glasses    reading    Patient Active Problem List   Diagnosis Date Noted  . Cognitive deficit, post-stroke 01/12/2017  . Aphasia, late effect of cerebrovascular disease 01/12/2017  . Right hemiparesis ([place])   . Cognitive dysfunction   . Coronary artery disease involving coronary bypass graft of native heart without angina pectoris   . Expressive aphasia 10/30/2016  . Thalamic infarct, acute ([place]) 10/30/2016  . Cerebral thrombosis with cerebral infarction 10/29/2016  . Acute CVA (cerebrovascular accident) ([place]) 10/29/2016  . Syncope 10/28/2016  . Long-term (current) use of anticoagulants 10/17/2015  . Typical atrial flutter ([place])   . Diabetes mellitus ([place]) 09/28/2015  . Paroxysmal atrial fibrillation ([facility])   . Coronary atherosclerosis of native coronary artery 07/14/2013  . Mixed hyperlipidemia 07/14/2013  . Hypertensive heart disease without CHF     Past Surgical History:  Procedure Laterality Date  . bil carpal  tunnel surgery    . CARDIAC CATHETERIZATION N/A 10/01/2015   Procedure: Left Heart Cath and Cors/Grafts Angiography;  Surgeon: Peter M Martinique, MD;  Location: [facility];  Service: Cardiovascular;  Laterality: N/A;  . COLONOSCOPY    . CORONARY ARTERY BYPASS GRAFT  2006    Coronary artery bypass grafting x 4 (left internal mammary  . DUPUYTREN CONTRACTURE RELEASE Left 04/05/2014   Procedure: EXCISION DUPUYTREN LEFT PALM, RING, SMALL, AND THUMB;  Surgeon: Cammie Sickle, MD;  Location: [facility];  Service: Orthopedics;  Laterality: Left;  . EYE SURGERY     both cataracts  . HEMORRHOID SURGERY    . LAMINECTOMY    . ROTATOR CUFF REPAIR    . TONSILLECTOMY    . TRANSURETHRAL RESECTION OF PROSTATE         Family History  Problem Relation Age of Onset  . Hypertension Father     Social History   Tobacco Use  . Smoking status: Former Research scientist (life sciences)  . Smokeless tobacco: Never Used  . Tobacco comment: Quit 1958  Substance Use Topics  . Alcohol use: No    Alcohol/week: 0.0 standard drinks  . Drug use: No    Home Medications Prior to Admission medications   Medication Sig Start Date End Date Taking? Authorizing Provider  apixaban (ELIQUIS) 5 MG TABS tablet Take 1 tablet (5 mg total) by mouth 2 (two) times daily. 11/10/16  Angiulli, Lavon Paganini, PA-C  glipiZIDE (GLUCOTROL) 5 MG tablet Take 1 tablet (5 mg total) by mouth daily before breakfast. 11/10/16   Angiulli, Lavon Paganini, PA-C  isosorbide mononitrate (IMDUR) 30 MG 24 hr tablet Take 0.5 tablets (15 mg total) by mouth daily. 11/10/16   Angiulli, Lavon Paganini, PA-C  linaclotide (LINZESS) 290 MCG CAPS capsule Take 1 capsule (290 mcg total) by mouth daily before breakfast. 11/11/16   Angiulli, Lavon Paganini, PA-C  pravastatin (PRAVACHOL) 20 MG tablet Take 1 tablet (20 mg total) by mouth daily at 6 PM. 11/10/16   Angiulli, Lavon Paganini, PA-C    Allergies    Amlodipine, Codeine, Metformin and related, Statins, and Welchol [colesevelam  hcl]  Review of Systems   Review of Systems  Unable to perform ROS: Dementia    Physical Exam Updated Vital Signs BP 129/67 (BP Location: Right Arm)   Pulse 86   Temp 98.7 F (37.1 C) (Oral)   Resp 18   Ht 5\' 10"  (1.778 m)   Wt 77.1 kg   SpO2 97%   BMI 24.39 kg/m   Physical Exam Vitals and nursing note reviewed.  Constitutional:      General: He is not in acute distress.    Appearance: He is well-developed. He is not diaphoretic.  HENT:     Head: Normocephalic and atraumatic.  Eyes:     General:        Left eye: Discharge present.    Extraocular Movements: Extraocular movements intact.     Pupils: Pupils are equal, round, and reactive to light.  Cardiovascular:     Rate and Rhythm: Normal rate and regular rhythm.     Heart sounds: Normal heart sounds.  Pulmonary:     Effort: Pulmonary effort is normal.     Breath sounds: Normal breath sounds.  Chest:     Chest wall: No tenderness.  Abdominal:     Palpations: Abdomen is soft.     Tenderness: There is no abdominal tenderness.  Musculoskeletal:        General: Tenderness present. No deformity.     Cervical back: Neck supple. No tenderness or bony tenderness.     Thoracic back: No tenderness or bony tenderness.     Lumbar back: No tenderness or bony tenderness.     Right lower leg: No edema.     Left lower leg: No edema.     Comments: No pain with ROM upper or lower extremities. Mild tenderness just inferior to the right patella with mild redness in area.  Skin:    General: Skin is warm and dry.     Findings: No erythema or rash.  Neurological:     Mental Status: He is alert.     Comments: Alert to person and place  Psychiatric:        Behavior: Behavior normal.     ED Results / Procedures / Treatments   Labs (all labs ordered are listed, but only abnormal results are displayed) Labs Reviewed - No data to display  EKG None  Radiology CT Head Wo Contrast  Result Date: 07/27/2020 CLINICAL DATA:   Head trauma.  Fall. EXAM: CT HEAD WITHOUT CONTRAST CT CERVICAL SPINE WITHOUT CONTRAST TECHNIQUE: Multidetector CT imaging of the head and cervical spine was performed following the standard protocol without intravenous contrast. Multiplanar CT image reconstructions of the cervical spine were also generated. COMPARISON:  Head CT and MRI 10/28/2016. Head and neck CTA 10/29/2016. FINDINGS: CT HEAD FINDINGS Brain: There is  no evidence of an acute infarct, intracranial hemorrhage, mass, midline shift, or extra-axial fluid collection. Chronic infarcts are noted in the left occipital lobe, left thalamus, and left cerebellum. Hypodensities elsewhere in the cerebral white matter bilaterally are nonspecific but compatible with mild chronic small vessel ischemic disease and have mildly progressed. There is mild cerebral atrophy. Vascular: Calcified atherosclerosis at the skull base. No hyperdense vessel. Skull: No fracture or suspicious osseous lesion. Sinuses/Orbits: Minimal mucosal thickening in the paranasal sinuses. Clear mastoid air cells. Bilateral cataract extraction. Other: None. CT CERVICAL SPINE FINDINGS Alignment: Cervical spine straightening. Unchanged 2 mm anterolisthesis of C3 on C4 which appears degenerative and facet mediated. Skull base and vertebrae: No acute fracture or suspicious osseous lesion. Soft tissues and spinal canal: No prevertebral fluid or swelling. No visible canal hematoma. Disc levels: Moderately advanced disc space narrowing at C6-7 with disc bulging and uncovertebral spurring resulting in likely mild spinal stenosis and moderate bilateral neural foraminal stenosis. Milder disc degeneration elsewhere. Advanced facet arthrosis on the left at C2-3 and on the right at C3-4 with corresponding moderate neural foraminal stenosis. Upper chest: Clear lung apices. Other: Mild calcific atherosclerosis at the carotid bifurcations. Partially visualized right anterior neck lipoma, similar to the prior  CTA. IMPRESSION: 1. No evidence of acute intracranial abnormality. 2. Mild chronic small vessel ischemic disease with multiple chronic infarcts as above. 3. No evidence of acute cervical spine fracture. Electronically Signed   By: Logan Bores M.D.   On: 07/27/2020 19:46   CT Cervical Spine Wo Contrast  Result Date: 07/27/2020 CLINICAL DATA:  Head trauma.  Fall. EXAM: CT HEAD WITHOUT CONTRAST CT CERVICAL SPINE WITHOUT CONTRAST TECHNIQUE: Multidetector CT imaging of the head and cervical spine was performed following the standard protocol without intravenous contrast. Multiplanar CT image reconstructions of the cervical spine were also generated. COMPARISON:  Head CT and MRI 10/28/2016. Head and neck CTA 10/29/2016. FINDINGS: CT HEAD FINDINGS Brain: There is no evidence of an acute infarct, intracranial hemorrhage, mass, midline shift, or extra-axial fluid collection. Chronic infarcts are noted in the left occipital lobe, left thalamus, and left cerebellum. Hypodensities elsewhere in the cerebral white matter bilaterally are nonspecific but compatible with mild chronic small vessel ischemic disease and have mildly progressed. There is mild cerebral atrophy. Vascular: Calcified atherosclerosis at the skull base. No hyperdense vessel. Skull: No fracture or suspicious osseous lesion. Sinuses/Orbits: Minimal mucosal thickening in the paranasal sinuses. Clear mastoid air cells. Bilateral cataract extraction. Other: None. CT CERVICAL SPINE FINDINGS Alignment: Cervical spine straightening. Unchanged 2 mm anterolisthesis of C3 on C4 which appears degenerative and facet mediated. Skull base and vertebrae: No acute fracture or suspicious osseous lesion. Soft tissues and spinal canal: No prevertebral fluid or swelling. No visible canal hematoma. Disc levels: Moderately advanced disc space narrowing at C6-7 with disc bulging and uncovertebral spurring resulting in likely mild spinal stenosis and moderate bilateral neural  foraminal stenosis. Milder disc degeneration elsewhere. Advanced facet arthrosis on the left at C2-3 and on the right at C3-4 with corresponding moderate neural foraminal stenosis. Upper chest: Clear lung apices. Other: Mild calcific atherosclerosis at the carotid bifurcations. Partially visualized right anterior neck lipoma, similar to the prior CTA. IMPRESSION: 1. No evidence of acute intracranial abnormality. 2. Mild chronic small vessel ischemic disease with multiple chronic infarcts as above. 3. No evidence of acute cervical spine fracture. Electronically Signed   By: Logan Bores M.D.   On: 07/27/2020 19:46   DG Knee Complete 4 Views Right  Result Date: 07/27/2020 CLINICAL DATA:  Fall with right knee pain EXAM: RIGHT KNEE - COMPLETE 4+ VIEW COMPARISON:  None. FINDINGS: No evidence of fracture, dislocation, or joint effusion. Mild degenerative marginal spurring. Two ossified bodies are present in the posterior joint line. Postoperative medial upper calf. Atherosclerotic calcification IMPRESSION: 1. No acute finding. 2. Ossified bodies in the posterior joint line. Electronically Signed   By: Monte Fantasia M.D.   On: 07/27/2020 19:05    Procedures Procedures (including critical care time)  Medications Ordered in ED Medications - No data to display  ED Course  I have reviewed the triage vital signs and the nursing notes.  Pertinent labs & imaging results that were available during my care of the patient were reviewed by me and considered in my medical decision making (see chart for details).  Clinical Course as of Jul 27 1958  Sat Oct 16, 142  144 86 year old male, on Eliquis, report of fall earlier today with complaint of right knee pain. Patient denies falls, is alert and oriented to person and place only.  On exam, no neck or back tenderness, no pain with ROM upper or lower extremities. Mild tenderness anterior right knee with small red area. CT head and c-spine negative for acute  findings. XR right knee without acute findings. Recommend Tylenol as needed as directed for pain, follow up with PCP if pain persists, return to ER for concerning symptoms.    [LM]    Clinical Course User Index [LM] Roque Lias   MDM Rules/Calculators/A&P                          Final Clinical Impression(s) / ED Diagnoses Final diagnoses:  Fall, initial encounter  Right knee pain, unspecified chronicity    Rx / DC Orders ED Discharge Orders    None       Roque Lias 07/27/20 2000    Pattricia Boss, MD 07/28/20 2322

## 2020-07-27 NOTE — ED Triage Notes (Signed)
Patient BIB EMS. Patient fell at 7am this morning. Was assisted back into bed. Complaining of right knee pain all day and hurts with range of motion but able to bear weight on the right side. Tender upon palpation. Patient is diabetic and had a meal prior to arrival. History of dementia.  EMS VITALS 148/64 HR 88 95% Room Air CBG 275

## 2020-07-27 NOTE — ED Notes (Signed)
[facility] called  

## 2020-07-27 NOTE — ED Notes (Signed)
Son [name] called and updated on fathers status.

## 2020-07-29 DIAGNOSIS — Z1159 Encounter for screening for other viral diseases: Secondary | ICD-10-CM | POA: Diagnosis not present

## 2020-07-31 DIAGNOSIS — E119 Type 2 diabetes mellitus without complications: Secondary | ICD-10-CM | POA: Diagnosis not present

## 2020-08-21 DIAGNOSIS — E1140 Type 2 diabetes mellitus with diabetic neuropathy, unspecified: Secondary | ICD-10-CM | POA: Diagnosis not present

## 2020-08-21 DIAGNOSIS — I4891 Unspecified atrial fibrillation: Secondary | ICD-10-CM | POA: Diagnosis not present

## 2020-08-26 DIAGNOSIS — N390 Urinary tract infection, site not specified: Secondary | ICD-10-CM | POA: Diagnosis not present

## 2020-08-26 DIAGNOSIS — Z1159 Encounter for screening for other viral diseases: Secondary | ICD-10-CM | POA: Diagnosis not present

## 2020-08-28 DIAGNOSIS — M4800 Spinal stenosis, site unspecified: Secondary | ICD-10-CM | POA: Diagnosis not present

## 2020-08-28 DIAGNOSIS — G629 Polyneuropathy, unspecified: Secondary | ICD-10-CM | POA: Diagnosis not present

## 2020-08-30 DIAGNOSIS — N401 Enlarged prostate with lower urinary tract symptoms: Secondary | ICD-10-CM | POA: Diagnosis not present

## 2020-08-30 DIAGNOSIS — M5412 Radiculopathy, cervical region: Secondary | ICD-10-CM | POA: Diagnosis not present

## 2020-08-30 DIAGNOSIS — M48062 Spinal stenosis, lumbar region with neurogenic claudication: Secondary | ICD-10-CM | POA: Diagnosis not present

## 2020-08-30 DIAGNOSIS — I69318 Other symptoms and signs involving cognitive functions following cerebral infarction: Secondary | ICD-10-CM | POA: Diagnosis not present

## 2020-08-30 DIAGNOSIS — M47816 Spondylosis without myelopathy or radiculopathy, lumbar region: Secondary | ICD-10-CM | POA: Diagnosis not present

## 2020-08-30 DIAGNOSIS — I2510 Atherosclerotic heart disease of native coronary artery without angina pectoris: Secondary | ICD-10-CM | POA: Diagnosis not present

## 2020-08-30 DIAGNOSIS — F419 Anxiety disorder, unspecified: Secondary | ICD-10-CM | POA: Diagnosis not present

## 2020-08-30 DIAGNOSIS — Z7984 Long term (current) use of oral hypoglycemic drugs: Secondary | ICD-10-CM | POA: Diagnosis not present

## 2020-08-30 DIAGNOSIS — F0150 Vascular dementia without behavioral disturbance: Secondary | ICD-10-CM | POA: Diagnosis not present

## 2020-08-30 DIAGNOSIS — F32A Depression, unspecified: Secondary | ICD-10-CM | POA: Diagnosis not present

## 2020-08-30 DIAGNOSIS — R32 Unspecified urinary incontinence: Secondary | ICD-10-CM | POA: Diagnosis not present

## 2020-08-30 DIAGNOSIS — E1165 Type 2 diabetes mellitus with hyperglycemia: Secondary | ICD-10-CM | POA: Diagnosis not present

## 2020-08-30 DIAGNOSIS — N39498 Other specified urinary incontinence: Secondary | ICD-10-CM | POA: Diagnosis not present

## 2020-08-30 DIAGNOSIS — I10 Essential (primary) hypertension: Secondary | ICD-10-CM | POA: Diagnosis not present

## 2020-08-30 DIAGNOSIS — I480 Paroxysmal atrial fibrillation: Secondary | ICD-10-CM | POA: Diagnosis not present

## 2020-08-30 DIAGNOSIS — E1140 Type 2 diabetes mellitus with diabetic neuropathy, unspecified: Secondary | ICD-10-CM | POA: Diagnosis not present

## 2020-09-25 DIAGNOSIS — F0390 Unspecified dementia without behavioral disturbance: Secondary | ICD-10-CM | POA: Diagnosis not present

## 2020-09-25 DIAGNOSIS — M79673 Pain in unspecified foot: Secondary | ICD-10-CM | POA: Diagnosis not present

## 2020-09-25 DIAGNOSIS — B351 Tinea unguium: Secondary | ICD-10-CM | POA: Diagnosis not present

## 2020-09-25 DIAGNOSIS — L603 Nail dystrophy: Secondary | ICD-10-CM | POA: Diagnosis not present

## 2020-09-30 DIAGNOSIS — F32A Depression, unspecified: Secondary | ICD-10-CM | POA: Diagnosis not present

## 2020-09-30 DIAGNOSIS — M47816 Spondylosis without myelopathy or radiculopathy, lumbar region: Secondary | ICD-10-CM | POA: Diagnosis not present

## 2020-09-30 DIAGNOSIS — I10 Essential (primary) hypertension: Secondary | ICD-10-CM | POA: Diagnosis not present

## 2020-09-30 DIAGNOSIS — F0150 Vascular dementia without behavioral disturbance: Secondary | ICD-10-CM | POA: Diagnosis not present

## 2020-09-30 DIAGNOSIS — E1165 Type 2 diabetes mellitus with hyperglycemia: Secondary | ICD-10-CM | POA: Diagnosis not present

## 2020-09-30 DIAGNOSIS — E1140 Type 2 diabetes mellitus with diabetic neuropathy, unspecified: Secondary | ICD-10-CM | POA: Diagnosis not present

## 2020-09-30 DIAGNOSIS — R32 Unspecified urinary incontinence: Secondary | ICD-10-CM | POA: Diagnosis not present

## 2020-09-30 DIAGNOSIS — I480 Paroxysmal atrial fibrillation: Secondary | ICD-10-CM | POA: Diagnosis not present

## 2020-09-30 DIAGNOSIS — I2510 Atherosclerotic heart disease of native coronary artery without angina pectoris: Secondary | ICD-10-CM | POA: Diagnosis not present

## 2020-09-30 DIAGNOSIS — Z7984 Long term (current) use of oral hypoglycemic drugs: Secondary | ICD-10-CM | POA: Diagnosis not present

## 2020-09-30 DIAGNOSIS — N39498 Other specified urinary incontinence: Secondary | ICD-10-CM | POA: Diagnosis not present

## 2020-09-30 DIAGNOSIS — F419 Anxiety disorder, unspecified: Secondary | ICD-10-CM | POA: Diagnosis not present

## 2020-09-30 DIAGNOSIS — M5412 Radiculopathy, cervical region: Secondary | ICD-10-CM | POA: Diagnosis not present

## 2020-09-30 DIAGNOSIS — M48062 Spinal stenosis, lumbar region with neurogenic claudication: Secondary | ICD-10-CM | POA: Diagnosis not present

## 2020-09-30 DIAGNOSIS — I69318 Other symptoms and signs involving cognitive functions following cerebral infarction: Secondary | ICD-10-CM | POA: Diagnosis not present

## 2020-09-30 DIAGNOSIS — N401 Enlarged prostate with lower urinary tract symptoms: Secondary | ICD-10-CM | POA: Diagnosis not present

## 2020-10-24 DIAGNOSIS — Z79899 Other long term (current) drug therapy: Secondary | ICD-10-CM | POA: Diagnosis not present

## 2020-12-03 DIAGNOSIS — L851 Acquired keratosis [keratoderma] palmaris et plantaris: Secondary | ICD-10-CM | POA: Diagnosis not present

## 2020-12-03 DIAGNOSIS — E118 Type 2 diabetes mellitus with unspecified complications: Secondary | ICD-10-CM | POA: Diagnosis not present

## 2020-12-03 DIAGNOSIS — M79673 Pain in unspecified foot: Secondary | ICD-10-CM | POA: Diagnosis not present

## 2020-12-03 DIAGNOSIS — B351 Tinea unguium: Secondary | ICD-10-CM | POA: Diagnosis not present

## 2020-12-05 DIAGNOSIS — G8929 Other chronic pain: Secondary | ICD-10-CM | POA: Diagnosis not present

## 2020-12-05 DIAGNOSIS — I10 Essential (primary) hypertension: Secondary | ICD-10-CM | POA: Diagnosis not present

## 2020-12-05 DIAGNOSIS — D649 Anemia, unspecified: Secondary | ICD-10-CM | POA: Diagnosis not present

## 2020-12-05 DIAGNOSIS — F32A Depression, unspecified: Secondary | ICD-10-CM | POA: Diagnosis not present

## 2020-12-08 DIAGNOSIS — M25551 Pain in right hip: Secondary | ICD-10-CM | POA: Diagnosis not present

## 2020-12-08 DIAGNOSIS — W19XXXA Unspecified fall, initial encounter: Secondary | ICD-10-CM | POA: Diagnosis not present

## 2020-12-08 DIAGNOSIS — M25552 Pain in left hip: Secondary | ICD-10-CM | POA: Diagnosis not present

## 2020-12-08 DIAGNOSIS — M533 Sacrococcygeal disorders, not elsewhere classified: Secondary | ICD-10-CM | POA: Diagnosis not present

## 2020-12-10 DIAGNOSIS — E039 Hypothyroidism, unspecified: Secondary | ICD-10-CM | POA: Diagnosis not present

## 2020-12-10 DIAGNOSIS — D508 Other iron deficiency anemias: Secondary | ICD-10-CM | POA: Diagnosis not present

## 2020-12-10 DIAGNOSIS — E08311 Diabetes mellitus due to underlying condition with unspecified diabetic retinopathy with macular edema: Secondary | ICD-10-CM | POA: Diagnosis not present

## 2020-12-10 DIAGNOSIS — E559 Vitamin D deficiency, unspecified: Secondary | ICD-10-CM | POA: Diagnosis not present

## 2020-12-11 DIAGNOSIS — M47811 Spondylosis without myelopathy or radiculopathy, occipito-atlanto-axial region: Secondary | ICD-10-CM | POA: Diagnosis not present

## 2020-12-11 DIAGNOSIS — I4891 Unspecified atrial fibrillation: Secondary | ICD-10-CM | POA: Diagnosis not present

## 2020-12-11 DIAGNOSIS — M47816 Spondylosis without myelopathy or radiculopathy, lumbar region: Secondary | ICD-10-CM | POA: Diagnosis not present

## 2020-12-11 DIAGNOSIS — H9190 Unspecified hearing loss, unspecified ear: Secondary | ICD-10-CM | POA: Diagnosis not present

## 2020-12-11 DIAGNOSIS — F0150 Vascular dementia without behavioral disturbance: Secondary | ICD-10-CM | POA: Diagnosis not present

## 2020-12-11 DIAGNOSIS — M48062 Spinal stenosis, lumbar region with neurogenic claudication: Secondary | ICD-10-CM | POA: Diagnosis not present

## 2020-12-11 DIAGNOSIS — I10 Essential (primary) hypertension: Secondary | ICD-10-CM | POA: Diagnosis not present

## 2020-12-11 DIAGNOSIS — D519 Vitamin B12 deficiency anemia, unspecified: Secondary | ICD-10-CM | POA: Diagnosis not present

## 2020-12-11 DIAGNOSIS — N400 Enlarged prostate without lower urinary tract symptoms: Secondary | ICD-10-CM | POA: Diagnosis not present

## 2020-12-11 DIAGNOSIS — J309 Allergic rhinitis, unspecified: Secondary | ICD-10-CM | POA: Diagnosis not present

## 2020-12-11 DIAGNOSIS — I2510 Atherosclerotic heart disease of native coronary artery without angina pectoris: Secondary | ICD-10-CM | POA: Diagnosis not present

## 2020-12-11 DIAGNOSIS — G8929 Other chronic pain: Secondary | ICD-10-CM | POA: Diagnosis not present

## 2020-12-11 DIAGNOSIS — E119 Type 2 diabetes mellitus without complications: Secondary | ICD-10-CM | POA: Diagnosis not present

## 2020-12-11 DIAGNOSIS — E785 Hyperlipidemia, unspecified: Secondary | ICD-10-CM | POA: Diagnosis not present

## 2020-12-11 DIAGNOSIS — M47817 Spondylosis without myelopathy or radiculopathy, lumbosacral region: Secondary | ICD-10-CM | POA: Diagnosis not present

## 2020-12-11 DIAGNOSIS — G629 Polyneuropathy, unspecified: Secondary | ICD-10-CM | POA: Diagnosis not present

## 2020-12-12 DIAGNOSIS — E119 Type 2 diabetes mellitus without complications: Secondary | ICD-10-CM | POA: Diagnosis not present

## 2020-12-12 DIAGNOSIS — I959 Hypotension, unspecified: Secondary | ICD-10-CM | POA: Diagnosis not present

## 2020-12-12 DIAGNOSIS — D519 Vitamin B12 deficiency anemia, unspecified: Secondary | ICD-10-CM | POA: Diagnosis not present

## 2020-12-12 DIAGNOSIS — G8929 Other chronic pain: Secondary | ICD-10-CM | POA: Diagnosis not present

## 2020-12-12 DIAGNOSIS — F32A Depression, unspecified: Secondary | ICD-10-CM | POA: Diagnosis not present

## 2020-12-17 DIAGNOSIS — D508 Other iron deficiency anemias: Secondary | ICD-10-CM | POA: Diagnosis not present

## 2020-12-17 DIAGNOSIS — E039 Hypothyroidism, unspecified: Secondary | ICD-10-CM | POA: Diagnosis not present

## 2020-12-17 DIAGNOSIS — E559 Vitamin D deficiency, unspecified: Secondary | ICD-10-CM | POA: Diagnosis not present

## 2020-12-17 DIAGNOSIS — D519 Vitamin B12 deficiency anemia, unspecified: Secondary | ICD-10-CM | POA: Diagnosis not present

## 2020-12-20 DIAGNOSIS — E119 Type 2 diabetes mellitus without complications: Secondary | ICD-10-CM | POA: Diagnosis not present

## 2021-01-09 DIAGNOSIS — F32A Depression, unspecified: Secondary | ICD-10-CM | POA: Diagnosis not present

## 2021-01-14 DIAGNOSIS — M47816 Spondylosis without myelopathy or radiculopathy, lumbar region: Secondary | ICD-10-CM | POA: Diagnosis not present

## 2021-01-14 DIAGNOSIS — E785 Hyperlipidemia, unspecified: Secondary | ICD-10-CM | POA: Diagnosis not present

## 2021-01-14 DIAGNOSIS — F0150 Vascular dementia without behavioral disturbance: Secondary | ICD-10-CM | POA: Diagnosis not present

## 2021-01-14 DIAGNOSIS — J309 Allergic rhinitis, unspecified: Secondary | ICD-10-CM | POA: Diagnosis not present

## 2021-01-14 DIAGNOSIS — M47811 Spondylosis without myelopathy or radiculopathy, occipito-atlanto-axial region: Secondary | ICD-10-CM | POA: Diagnosis not present

## 2021-01-14 DIAGNOSIS — G629 Polyneuropathy, unspecified: Secondary | ICD-10-CM | POA: Diagnosis not present

## 2021-01-14 DIAGNOSIS — E119 Type 2 diabetes mellitus without complications: Secondary | ICD-10-CM | POA: Diagnosis not present

## 2021-01-14 DIAGNOSIS — G8929 Other chronic pain: Secondary | ICD-10-CM | POA: Diagnosis not present

## 2021-01-14 DIAGNOSIS — I2510 Atherosclerotic heart disease of native coronary artery without angina pectoris: Secondary | ICD-10-CM | POA: Diagnosis not present

## 2021-01-14 DIAGNOSIS — M47817 Spondylosis without myelopathy or radiculopathy, lumbosacral region: Secondary | ICD-10-CM | POA: Diagnosis not present

## 2021-01-14 DIAGNOSIS — I10 Essential (primary) hypertension: Secondary | ICD-10-CM | POA: Diagnosis not present

## 2021-01-14 DIAGNOSIS — D519 Vitamin B12 deficiency anemia, unspecified: Secondary | ICD-10-CM | POA: Diagnosis not present

## 2021-01-14 DIAGNOSIS — N400 Enlarged prostate without lower urinary tract symptoms: Secondary | ICD-10-CM | POA: Diagnosis not present

## 2021-01-14 DIAGNOSIS — M48062 Spinal stenosis, lumbar region with neurogenic claudication: Secondary | ICD-10-CM | POA: Diagnosis not present

## 2021-01-14 DIAGNOSIS — I4891 Unspecified atrial fibrillation: Secondary | ICD-10-CM | POA: Diagnosis not present

## 2021-01-14 DIAGNOSIS — H9190 Unspecified hearing loss, unspecified ear: Secondary | ICD-10-CM | POA: Diagnosis not present

## 2021-01-16 ENCOUNTER — Non-Acute Institutional Stay: Payer: Medicare Other | Admitting: Nurse Practitioner

## 2021-01-16 ENCOUNTER — Other Ambulatory Visit: Payer: Self-pay

## 2021-01-16 DIAGNOSIS — Z515 Encounter for palliative care: Secondary | ICD-10-CM

## 2021-01-16 DIAGNOSIS — F0150 Vascular dementia without behavioral disturbance: Secondary | ICD-10-CM

## 2021-01-16 NOTE — Progress Notes (Signed)
[facility] Consult Note Telephone: [phone]  Fax: [phone]  PATIENT NAME: Jonathan [name] [address] [place] [address] [phone] (home)  DOB: [birth year] MRN: [id number]  PRIMARY CARE PROVIDER:    Lavone Orn, MD,  [facility] [employer] [place] [address] [address] [phone]  REFERRING PROVIDER:   Lavone Orn, MD [facility] [employer] [place] [address],  [place] [address] [phone]  RESPONSIBLE PARTY:   Extended Emergency Contact Information Primary Emergency Contact: Jonathan [name], Jonathan [name] [place] of [place] Phone: [phone] Relation: Son Secondary Emergency Contact: Jonathan [name] Mobile Phone: [phone] Relation: Daughter  I met face to face with patient in facility.  ASSESSMENT AND RECOMMENDATIONS:   Advance Care Planning: Goal of care: Goal of care is comfort. Directives: Signed DNR and MOST form present on file and on [facility] EMR, Details of MOST form include comfort measures, determine use or limitation of antibiotics, IV fluids for defined trial period, no feeding tube. Patient reiterated desire to not be resuscitated in the event of cardiac or respiratory arrest.  Cognitive / Functional decline/Symptom Management:  Patient awake and alert, moderate confusion. Requires moderate assistance with his ADLs, ambulates independently.  Dementia is ongoing and progressing. Patient continues to require cues and reminders to complete his ADLs. Staff report patient stable at this time. No report of uncontrolled pain or uncontrolled behavior. No report of fever, chills, SOB, or chest pain. Patient tolerating anticoagulation with Eliquis without report of abnormal bruising or acute bleed. Provided general support and encouragement. Discussion on disease trajectory of dementia with staff, as it is progressive and terminal and likely to eventually lead to dysphagia, weight loss and immobility.  Staff encouraged to call with questions and/or concerns for patient. Will reach out to patient's family to discuss visit findings. Palliative care will continue to provide support to patient, family and the medical team.   Follow up Palliative Care Visit: Palliative care will continue to follow for complex decision making and symptom management. Return about 4- 6 weeks or prn.  Family /Caregiver/Community Supports: Patient is a resident of memory care care unit of [facility] facility.  I spent 47 minutes providing this consultation, time includes time spent with patient, chart review, provider coordination, and documentation. More than 50% of the time in this consultation was spent counseling and coordinating communication.   History obtained from review of EMR, discussion with facility staff, and interview with patient. Records reviewed and summarized bellow.  HISTORY OF PRESENT ILLNESS:Jonathan R Lowderis a 87 y.o.year oldmalewith multiple medical problems including Dementia (FAST 6c), CVA,BPH, CAD s/p CABG x 4, Dementia, Type 2 DM, HTN. Palliative Care was asked to help address goals of care and with symptoms management.  CODE STATUS: DNR  PPS: 50%  HOSPICE ELIGIBILITY/DIAGNOSIS: TBD  PHYSICAL EXAM/ROS:  General: NAD, frail appearing Cardiovascular: no chest pain reported, no edema  Pulmonary: no cough, no increased SOB, room air Abdomen: appetite fair, no report of constipation, continent of bowel GU: denies dysuria, incontinent of urine MSK:  no joint and ROM abnormalities, ambulatory Skin: no rashes or wounds reported Neurological: Weakness, but otherwise nonfocal  PAST MEDICAL HISTORY:  Past Medical History:  Diagnosis Date  . Allergic rhinitis   . BPH (benign prostatic hypertrophy)   . Coronary atherosclerosis of native coronary artery   . Degeneration of lumbar or lumbosacral intervertebral disc   . Diabetes ([place])    no meds  . Dyslipidemia   . HOH  (  hard of hearing)   . Hyperlipidemia   . Hypertension   . Right leg weakness   . S/P CABG x 4 2006   LIMA-LAD, SVG-rPDA, sSVG-OM1-OM2  . Stroke ([place])   . Wears glasses    reading    SOCIAL HX:  Social History   Tobacco Use  . Smoking status: Former Research scientist (life sciences)  . Smokeless tobacco: Never Used  . Tobacco comment: Quit 1958  Substance Use Topics  . Alcohol use: No    Alcohol/week: 0.0 standard drinks   FAMILY HX:  Family History  Problem Relation Age of Onset  . Hypertension Father     ALLERGIES:  Allergies  Allergen Reactions  . Amlodipine Other (See Comments)    Fatigue   . Codeine     unknown  . Metformin And Related Other (See Comments)    fatigue  . Statins     "pass out, dizzy, drunk feeling, poor balance"  . Welchol [Colesevelam Hcl]      PERTINENT MEDICATIONS:  Outpatient Encounter Medications as of 01/16/2021  Medication Sig  . apixaban (ELIQUIS) 5 MG TABS tablet Take 1 tablet (5 mg total) by mouth 2 (two) times daily.  Marland Kitchen glipiZIDE (GLUCOTROL) 5 MG tablet Take 1 tablet (5 mg total) by mouth daily before breakfast.  . isosorbide mononitrate (IMDUR) 30 MG 24 hr tablet Take 0.5 tablets (15 mg total) by mouth daily.  Marland Kitchen linaclotide (LINZESS) 290 MCG CAPS capsule Take 1 capsule (290 mcg total) by mouth daily before breakfast.  . pravastatin (PRAVACHOL) 20 MG tablet Take 1 tablet (20 mg total) by mouth daily at 6 PM.   No facility-administered encounter medications on file as of 01/16/2021.    Thank you for the opportunity to participate in the care of Mr. Jonathan [name]. Jonathan [name]. The palliative care team will continue to follow. Please call our office at [phone] if we can be of additional assistance.  Jonathan Favre, DNP, AGPCNP-BC

## 2021-01-18 ENCOUNTER — Emergency Department (HOSPITAL_COMMUNITY): Payer: Medicare Other

## 2021-01-18 ENCOUNTER — Other Ambulatory Visit: Payer: Self-pay

## 2021-01-18 ENCOUNTER — Emergency Department (HOSPITAL_COMMUNITY)
Admission: EM | Admit: 2021-01-18 | Discharge: 2021-01-18 | Disposition: A | Discharge status: Home / Self Care | Payer: Medicare Other | Attending: Emergency Medicine | Admitting: Emergency Medicine

## 2021-01-18 ENCOUNTER — Encounter (HOSPITAL_COMMUNITY): Payer: Self-pay

## 2021-01-18 DIAGNOSIS — M25552 Pain in left hip: Secondary | ICD-10-CM | POA: Diagnosis not present

## 2021-01-18 DIAGNOSIS — I708 Atherosclerosis of other arteries: Secondary | ICD-10-CM | POA: Diagnosis not present

## 2021-01-18 DIAGNOSIS — W19XXXA Unspecified fall, initial encounter: Secondary | ICD-10-CM

## 2021-01-18 DIAGNOSIS — Z951 Presence of aortocoronary bypass graft: Secondary | ICD-10-CM | POA: Diagnosis not present

## 2021-01-18 DIAGNOSIS — M1612 Unilateral primary osteoarthritis, left hip: Secondary | ICD-10-CM | POA: Diagnosis not present

## 2021-01-18 DIAGNOSIS — G9389 Other specified disorders of brain: Secondary | ICD-10-CM | POA: Diagnosis not present

## 2021-01-18 DIAGNOSIS — F0390 Unspecified dementia without behavioral disturbance: Secondary | ICD-10-CM | POA: Diagnosis not present

## 2021-01-18 DIAGNOSIS — Z7984 Long term (current) use of oral hypoglycemic drugs: Secondary | ICD-10-CM | POA: Insufficient documentation

## 2021-01-18 DIAGNOSIS — E119 Type 2 diabetes mellitus without complications: Secondary | ICD-10-CM | POA: Insufficient documentation

## 2021-01-18 DIAGNOSIS — M47816 Spondylosis without myelopathy or radiculopathy, lumbar region: Secondary | ICD-10-CM | POA: Diagnosis not present

## 2021-01-18 DIAGNOSIS — I2510 Atherosclerotic heart disease of native coronary artery without angina pectoris: Secondary | ICD-10-CM | POA: Insufficient documentation

## 2021-01-18 DIAGNOSIS — S62627A Displaced fracture of medial phalanx of left little finger, initial encounter for closed fracture: Secondary | ICD-10-CM | POA: Diagnosis not present

## 2021-01-18 DIAGNOSIS — I119 Hypertensive heart disease without heart failure: Secondary | ICD-10-CM | POA: Insufficient documentation

## 2021-01-18 DIAGNOSIS — M79646 Pain in unspecified finger(s): Secondary | ICD-10-CM | POA: Diagnosis not present

## 2021-01-18 DIAGNOSIS — W1839XA Other fall on same level, initial encounter: Secondary | ICD-10-CM | POA: Diagnosis not present

## 2021-01-18 DIAGNOSIS — S62617A Displaced fracture of proximal phalanx of left little finger, initial encounter for closed fracture: Secondary | ICD-10-CM | POA: Diagnosis not present

## 2021-01-18 DIAGNOSIS — Z87891 Personal history of nicotine dependence: Secondary | ICD-10-CM | POA: Diagnosis not present

## 2021-01-18 DIAGNOSIS — M549 Dorsalgia, unspecified: Secondary | ICD-10-CM | POA: Diagnosis not present

## 2021-01-18 DIAGNOSIS — Z7901 Long term (current) use of anticoagulants: Secondary | ICD-10-CM | POA: Insufficient documentation

## 2021-01-18 DIAGNOSIS — R0902 Hypoxemia: Secondary | ICD-10-CM | POA: Diagnosis not present

## 2021-01-18 DIAGNOSIS — S60947A Unspecified superficial injury of left little finger, initial encounter: Secondary | ICD-10-CM | POA: Diagnosis present

## 2021-01-18 NOTE — Progress Notes (Signed)
Orthopedic Tech Progress Note Patient Details:  Jonathan [name] 06/17/1929 [id number]  Ortho Devices Type of Ortho Device: Finger splint Ortho Device/Splint Location: left Ortho Device/Splint Interventions: Application   Post Interventions Patient Tolerated: Well Instructions Provided: Care of device   [name] 01/18/2021, 12:24 PM

## 2021-01-18 NOTE — ED Notes (Signed)
Report given to Lavella Hammock at [facility].

## 2021-01-18 NOTE — ED Notes (Signed)
[facility] call for patient transport.

## 2021-01-18 NOTE — ED Provider Notes (Signed)
[facility] DEPT Provider Note   CSN: [id number] Arrival date & time: 01/18/21  5427     History No chief complaint on file.   Jonathan [name] is a 87 y.o. male.  HPI Level 5 caveat due to dementia. Patient sent from nursing home.  Reportedly had 1 or 2 unwitnessed fall.  Patient does not know what happened.  Reportedly has some pain in left hip and left upper extremity.  Patient states just pain in his left hip and hand.  There is some swelling of the left fifth finger with ecchymosis.  Cervical collar in place with no midline tenderness.  No evidence of trauma to head.  Patient appears to be on anticoagulation.    Past Medical History:  Diagnosis Date  . Allergic rhinitis   . BPH (benign prostatic hypertrophy)   . Coronary atherosclerosis of native coronary artery   . Degeneration of lumbar or lumbosacral intervertebral disc   . Diabetes ([place])    no meds  . Dyslipidemia   . HOH (hard of hearing)   . Hyperlipidemia   . Hypertension   . Right leg weakness   . S/P CABG x 4 2006   LIMA-LAD, SVG-rPDA, sSVG-OM1-OM2  . Stroke ([place])   . Wears glasses    reading    Patient Active Problem List   Diagnosis Date Noted  . Cognitive deficit, post-stroke 01/12/2017  . Aphasia, late effect of cerebrovascular disease 01/12/2017  . Right hemiparesis ([place])   . Cognitive dysfunction   . Coronary artery disease involving coronary bypass graft of native heart without angina pectoris   . Expressive aphasia 10/30/2016  . Thalamic infarct, acute ([place]) 10/30/2016  . Cerebral thrombosis with cerebral infarction 10/29/2016  . Acute CVA (cerebrovascular accident) ([place]) 10/29/2016  . Syncope 10/28/2016  . Long-term (current) use of anticoagulants 10/17/2015  . Typical atrial flutter ([place])   . Diabetes mellitus ([place]) 09/28/2015  . Paroxysmal atrial fibrillation ([facility])   . Coronary atherosclerosis of native coronary artery 07/14/2013  . Mixed hyperlipidemia  07/14/2013  . Hypertensive heart disease without CHF     Past Surgical History:  Procedure Laterality Date  . bil carpal tunnel surgery    . CARDIAC CATHETERIZATION N/A 10/01/2015   Procedure: Left Heart Cath and Cors/Grafts Angiography;  Surgeon: Peter M Martinique, MD;  Location: [facility];  Service: Cardiovascular;  Laterality: N/A;  . COLONOSCOPY    . CORONARY ARTERY BYPASS GRAFT  2006    Coronary artery bypass grafting x 4 (left internal mammary  . DUPUYTREN CONTRACTURE RELEASE Left 04/05/2014   Procedure: EXCISION DUPUYTREN LEFT PALM, RING, SMALL, AND THUMB;  Surgeon: Cammie Sickle, MD;  Location: [facility];  Service: Orthopedics;  Laterality: Left;  . EYE SURGERY     both cataracts  . HEMORRHOID SURGERY    . LAMINECTOMY    . ROTATOR CUFF REPAIR    . TONSILLECTOMY    . TRANSURETHRAL RESECTION OF PROSTATE         Family History  Problem Relation Age of Onset  . Hypertension Father     Social History   Tobacco Use  . Smoking status: Former Research scientist (life sciences)  . Smokeless tobacco: Never Used  . Tobacco comment: Quit 1958  Substance Use Topics  . Alcohol use: No    Alcohol/week: 0.0 standard drinks  . Drug use: No    Home Medications Prior to Admission medications   Medication Sig Start Date End Date Taking? Authorizing  Provider  apixaban (ELIQUIS) 5 MG TABS tablet Take 1 tablet (5 mg total) by mouth 2 (two) times daily. 11/10/16   Angiulli, Lavon Paganini, PA-C  glipiZIDE (GLUCOTROL) 5 MG tablet Take 1 tablet (5 mg total) by mouth daily before breakfast. 11/10/16   Angiulli, Lavon Paganini, PA-C  isosorbide mononitrate (IMDUR) 30 MG 24 hr tablet Take 0.5 tablets (15 mg total) by mouth daily. 11/10/16   Angiulli, Lavon Paganini, PA-C  linaclotide (LINZESS) 290 MCG CAPS capsule Take 1 capsule (290 mcg total) by mouth daily before breakfast. 11/11/16   Angiulli, Lavon Paganini, PA-C  pravastatin (PRAVACHOL) 20 MG tablet Take 1 tablet (20 mg total) by mouth daily at 6 PM. 11/10/16    Angiulli, Lavon Paganini, PA-C    Allergies    Amlodipine, Codeine, Metformin and related, Statins, and Welchol [colesevelam hcl]  Review of Systems   Review of Systems  Unable to perform ROS: Dementia    Physical Exam Updated Vital Signs BP 115/61   Pulse 92   Temp 98 F (36.7 C) (Oral)   Resp 16   SpO2 96%   Physical Exam Vitals reviewed.  HENT:     Head: Atraumatic.     Right Ear: External ear normal.     Left Ear: External ear normal.     Mouth/Throat:     Mouth: Mucous membranes are moist.  Eyes:     Extraocular Movements: Extraocular movements intact.  Cardiovascular:     Rate and Rhythm: Regular rhythm.  Pulmonary:     Breath sounds: No wheezing or rhonchi.  Chest:     Chest wall: No tenderness.  Abdominal:     General: There is no distension.  Musculoskeletal:     Cervical back: Neck supple. No tenderness.     Comments: Right sided extremities nontender.  Lumbar spine thoracic and cervical spine nontender.  No tenderness over pelvis.  No real tenderness over left hip.  Good range of motion hip knee and ankle.  There is some swelling ecchymosis and tenderness over the middle phalanx of the left fifth finger.  Neurological:     Mental Status: He is alert.     Comments: Awake and pleasant but some dementia.  Reportedly at baseline.     ED Results / Procedures / Treatments   Labs (all labs ordered are listed, but only abnormal results are displayed) Labs Reviewed - No data to display  EKG None  Radiology CT Head Wo Contrast  Result Date: 01/18/2021 CLINICAL DATA:  87 year old who had an unwitnessed fall last night and again this morning. Current history of dementia. EXAM: CT HEAD WITHOUT CONTRAST TECHNIQUE: Contiguous axial images were obtained from the base of the skull through the vertex without intravenous contrast. COMPARISON:  CT head 07/27/2020 and earlier.  MRI brain 08/28/2017. FINDINGS: Brain: Moderate to severe cortical and deep atrophy. Remote LEFT  occipital lobe stroke, unchanged. Moderate to severe changes of small vessel disease of the white matter. Remote lacunar stroke in the LEFT thalamus. Remote strokes in the cerebellar hemispheres bilaterally. No new brain parenchymal abnormalities. No mass lesion. No midline shift. No acute hemorrhage or hematoma. No extra-axial fluid collections. Vascular: Severe BILATERAL carotid siphon and mild LEFT vertebral artery atherosclerosis. No hyperdense vessel. Skull: No skull fracture or other focal osseous abnormality involving the skull. Sinuses/Orbits: Minimal mucosal thickening in the visualized maxillary sinuses. Remaining visualized paranasal sinuses, BILATERAL mastoid air cells and BILATERAL middle ear cavities well-aerated. Visualized orbits and globes unremarkable. Other: None. IMPRESSION: 1.  No acute intracranial abnormality. 2. Stable moderate to severe generalized atrophy and moderate to severe chronic microvascular ischemic changes of the white matter. 3. Remote LEFT occipital lobe stroke. 4. Remote strokes in the LEFT thalamus, the cervical hemispheres bilaterally and the LEFT thalamus. 5. Minimal chronic bilateral maxillary sinus disease. Electronically Signed   By: Evangeline Dakin M.D.   On: 01/18/2021 10:31   DG Finger Little Left  Result Date: 01/18/2021 CLINICAL DATA:  Pain following fall EXAM: LEFT FIFTH FINGER 2+V COMPARISON:  None. FINDINGS: Frontal, oblique, and lateral views were obtained. There is a fracture along the volar aspect of the proximal portion of the fifth middle phalanx with mild displacement of the avulsed fracture fragment. No other fracture no dislocation. There is joint space narrowing involving the fifth PIP and D IP joints. No erosion. Underlying degree of osteoporosis. IMPRESSION: Fracture along the volar aspect of the proximal portion of the fifth middle phalanx with mild fracture fragment displacement in this area. No other fracture. No dislocation. Osteoarthritic change  in the fifth PIP and DIP joints. Osteoporosis. Electronically Signed   By: Lowella Grip III M.D.   On: 01/18/2021 11:00   DG Hip Unilat W or Wo Pelvis 2-3 Views Left  Result Date: 01/18/2021 CLINICAL DATA:  Pain following fall EXAM: DG HIP (WITH OR WITHOUT PELVIS) 2-3V LEFT COMPARISON:  May 09, 2018 FINDINGS: Frontal pelvis as well as frontal and lateral left hip images were obtained. No acute fracture or dislocation. There is severe narrowing of the left hip joint, stable, with milder narrowing of the right hip joint, stable. No erosive change. There is postoperative change in the lower lumbar region. There are foci of atherosclerotic arterial vascular calcification bilaterally. IMPRESSION: No acute fracture or dislocation. Osteoarthritic change in the hip joints, considerably more severe on the left than on the right, stable. Degenerative change also noted in lower lumbar spine. Foci of atherosclerotic arterial vascular calcification noted. Electronically Signed   By: Lowella Grip III M.D.   On: 01/18/2021 10:59    Procedures Procedures   Medications Ordered in ED Medications - No data to display  ED Course  I have reviewed the triage vital signs and the nursing notes.  Pertinent labs & imaging results that were available during my care of the patient were reviewed by me and considered in my medical decision making (see chart for details).    MDM Rules/Calculators/A&P                          Patient with reported fall.  No evidence of trauma on head and cervical spine nontender, however is on anticoagulation and CT scan done and reassuring.  No tenderness over left hip but with reported pain in this area x-ray done and reassuring.  No acute fracture does have some chronic disease there.  Does have fracture of the proximal aspect of the middle phalanx of the left fifth finger.  Will immobilize and have follow-up with hand surgery in a week.  Discharge back to nursing home. Final  Clinical Impression(s) / ED Diagnoses Final diagnoses:  Fall, initial encounter  Closed displaced fracture of middle phalanx of left little finger, initial encounter    Rx / DC Orders ED Discharge Orders    None       Davonna Belling, MD 01/19/21 [phone]

## 2021-01-18 NOTE — ED Triage Notes (Addendum)
Patient coming from [facility] with c/o unwitnessed fall. Patient c/o left hip pain and left little finger. Patient fell last night and this morning both unwitnessed. Patient have hx of dementia.

## 2021-01-18 NOTE — Discharge Instructions (Signed)
The little finger is broken.  Follow-up with Dr. Lenon Curt for further management.  No other injury was found

## 2021-01-18 NOTE — ED Notes (Signed)
Patient unable to sign MSE due to alter mental status.

## 2021-01-23 DIAGNOSIS — S62609A Fracture of unspecified phalanx of unspecified finger, initial encounter for closed fracture: Secondary | ICD-10-CM | POA: Diagnosis not present

## 2021-01-23 DIAGNOSIS — E119 Type 2 diabetes mellitus without complications: Secondary | ICD-10-CM | POA: Diagnosis not present

## 2021-01-23 DIAGNOSIS — D519 Vitamin B12 deficiency anemia, unspecified: Secondary | ICD-10-CM | POA: Diagnosis not present

## 2021-01-26 DIAGNOSIS — M25562 Pain in left knee: Secondary | ICD-10-CM | POA: Diagnosis not present

## 2021-01-26 DIAGNOSIS — M79652 Pain in left thigh: Secondary | ICD-10-CM | POA: Diagnosis not present

## 2021-01-26 DIAGNOSIS — W19XXXA Unspecified fall, initial encounter: Secondary | ICD-10-CM | POA: Diagnosis not present

## 2021-01-26 DIAGNOSIS — M25552 Pain in left hip: Secondary | ICD-10-CM | POA: Diagnosis not present

## 2021-01-27 ENCOUNTER — Emergency Department (HOSPITAL_COMMUNITY)
Admission: EM | Admit: 2021-01-27 | Discharge: 2021-01-27 | Disposition: A | Discharge status: Home / Self Care | Payer: Medicare Other | Attending: Emergency Medicine | Admitting: Emergency Medicine

## 2021-01-27 ENCOUNTER — Encounter (HOSPITAL_COMMUNITY): Payer: Self-pay | Admitting: Emergency Medicine

## 2021-01-27 ENCOUNTER — Emergency Department (HOSPITAL_COMMUNITY): Payer: Medicare Other

## 2021-01-27 ENCOUNTER — Other Ambulatory Visit: Payer: Self-pay

## 2021-01-27 DIAGNOSIS — I10 Essential (primary) hypertension: Secondary | ICD-10-CM | POA: Diagnosis not present

## 2021-01-27 DIAGNOSIS — R279 Unspecified lack of coordination: Secondary | ICD-10-CM | POA: Diagnosis not present

## 2021-01-27 DIAGNOSIS — Z79899 Other long term (current) drug therapy: Secondary | ICD-10-CM | POA: Insufficient documentation

## 2021-01-27 DIAGNOSIS — F0390 Unspecified dementia without behavioral disturbance: Secondary | ICD-10-CM | POA: Diagnosis not present

## 2021-01-27 DIAGNOSIS — Z7901 Long term (current) use of anticoagulants: Secondary | ICD-10-CM | POA: Insufficient documentation

## 2021-01-27 DIAGNOSIS — Z951 Presence of aortocoronary bypass graft: Secondary | ICD-10-CM | POA: Insufficient documentation

## 2021-01-27 DIAGNOSIS — M545 Low back pain, unspecified: Secondary | ICD-10-CM

## 2021-01-27 DIAGNOSIS — Z87891 Personal history of nicotine dependence: Secondary | ICD-10-CM | POA: Diagnosis not present

## 2021-01-27 DIAGNOSIS — R0902 Hypoxemia: Secondary | ICD-10-CM | POA: Diagnosis not present

## 2021-01-27 DIAGNOSIS — Z7984 Long term (current) use of oral hypoglycemic drugs: Secondary | ICD-10-CM | POA: Insufficient documentation

## 2021-01-27 DIAGNOSIS — Z743 Need for continuous supervision: Secondary | ICD-10-CM | POA: Diagnosis not present

## 2021-01-27 DIAGNOSIS — E119 Type 2 diabetes mellitus without complications: Secondary | ICD-10-CM | POA: Insufficient documentation

## 2021-01-27 DIAGNOSIS — M549 Dorsalgia, unspecified: Secondary | ICD-10-CM | POA: Diagnosis not present

## 2021-01-27 HISTORY — DX: Vascular dementia, unspecified severity, without behavioral disturbance, psychotic disturbance, mood disturbance, and anxiety: F01.50

## 2021-01-27 NOTE — ED Notes (Signed)
Called [facility] to transport patient back to facility. Gave report to [facility].

## 2021-01-27 NOTE — ED Triage Notes (Signed)
Pt arrives via EMS from [facility]. Pt has been having back pain for a week. Pain located in the lower left side.

## 2021-01-27 NOTE — ED Provider Notes (Signed)
[facility] DEPT Provider Note: Georgena Spurling, MD, FACEP  CSN: [id number] MRN: [id number] ARRIVAL: 01/27/21 at [place]: [place]  Back Pain  Level 5 caveat: Dementia HISTORY OF PRESENT ILLNESS  01/27/21 2:10 AM Jonathan [name] is a 87 y.o. male who was sent from his living facility for left lower back pain for a week.  Of note he was seen on 01/18/2021 for a fall.  Radiographs at that time were unremarkable.  He subsequently had radiographs at his living facility.  Left knee showed arthritic changes otherwise no fracture.  Left tib-fib showed no acute osseous abnormality.  Left femur showed no acute osseous abnormality.  Left hip showed hip arthritis but no acute osseous abnormality.  EMS reports that he was sent here because he continues to have pain and either he (the patient) or they (the living facility) 1 and x-rays of his back.  The patient himself has no acute complaints to me apart from his right side not working as well as his left side for many many years (he does have a history of a stroke with right hemiparesis) and that his head does not work right.   Past Medical History:  Diagnosis Date  . Allergic rhinitis   . BPH (benign prostatic hypertrophy)   . Coronary atherosclerosis of native coronary artery   . Degeneration of lumbar or lumbosacral intervertebral disc   . Diabetes ([place])    no meds  . Dyslipidemia   . HOH (hard of hearing)   . Hyperlipidemia   . Hypertension   . Right leg weakness   . S/P CABG x 4 2006   LIMA-LAD, SVG-rPDA, sSVG-OM1-OM2  . Stroke ([place])   . Vascular dementia ([place])   . Wears glasses    reading    Past Surgical History:  Procedure Laterality Date  . bil carpal tunnel surgery    . CARDIAC CATHETERIZATION N/A 10/01/2015   Procedure: Left Heart Cath and Cors/Grafts Angiography;  Surgeon: Peter M Martinique, MD;  [facility];  Service: Cardiovascular;  Laterality: N/A;  . COLONOSCOPY    . CORONARY ARTERY  BYPASS GRAFT  2006    Coronary artery bypass grafting x 4 (left internal mammary  . DUPUYTREN CONTRACTURE RELEASE Left 04/05/2014   Procedure: EXCISION DUPUYTREN LEFT PALM, RING, SMALL, AND THUMB;  Surgeon: Cammie Sickle, MD;  Location: [facility];  Service: Orthopedics;  Laterality: Left;  . EYE SURGERY     both cataracts  . HEMORRHOID SURGERY    . LAMINECTOMY    . ROTATOR CUFF REPAIR    . TONSILLECTOMY    . TRANSURETHRAL RESECTION OF PROSTATE      Family History  Problem Relation Age of Onset  . Hypertension Father     Social History   Tobacco Use  . Smoking status: Former Research scientist (life sciences)  . Smokeless tobacco: Never Used  . Tobacco comment: Quit 1958  Substance Use Topics  . Alcohol use: No    Alcohol/week: 0.0 standard drinks  . Drug use: No    Prior to Admission medications   Medication Sig Start Date End Date Taking? Authorizing Provider  apixaban (ELIQUIS) 5 MG TABS tablet Take 1 tablet (5 mg total) by mouth 2 (two) times daily. 11/10/16   Angiulli, Lavon Paganini, PA-C  glipiZIDE (GLUCOTROL) 5 MG tablet Take 1 tablet (5 mg total) by mouth daily before breakfast. 11/10/16   Angiulli, Lavon Paganini, PA-C  isosorbide mononitrate (IMDUR) 30 MG  24 hr tablet Take 0.5 tablets (15 mg total) by mouth daily. 11/10/16   Angiulli, Lavon Paganini, PA-C  linaclotide (LINZESS) 290 MCG CAPS capsule Take 1 capsule (290 mcg total) by mouth daily before breakfast. 11/11/16   Angiulli, Lavon Paganini, PA-C  pravastatin (PRAVACHOL) 20 MG tablet Take 1 tablet (20 mg total) by mouth daily at 6 PM. 11/10/16   Angiulli, Lavon Paganini, PA-C    Allergies Amlodipine, Codeine, Metformin and related, Statins, and Welchol [colesevelam hcl]   REVIEW OF SYSTEMS  Negative except as noted here or in the History of Present Illness.   PHYSICAL EXAMINATION  Initial Vital Signs Blood pressure (!) 142/60, pulse 68, temperature 99 F (37.2 C), temperature source Oral, resp. rate (!) 21, height 5\' 10"  (1.778 m), weight  77.1 kg, SpO2 94 %.  Examination General: Well-developed, well-nourished male in no acute distress; appearance consistent with age of record HENT: normocephalic; atraumatic Eyes: pupils equal, round and reactive to light; extraocular muscles intact Neck: supple Heart: regular rate and rhythm Lungs: clear to auscultation bilaterally Abdomen: soft; nondistended; nontender; bowel sounds present Extremities: Arthritic changes; no pain on passive movement of lower extremities Back: Mild left lower back pain on range of motion Neurologic: Awake, alert and oriented x2; hard of hearing; motor function intact in all extremities and symmetric; no facial droop Skin: Warm and dry Psychiatric: Normal mood and affect but gives confused and incoherent answers to questions   RESULTS  Summary of this visit's results, reviewed and interpreted by myself:   EKG Interpretation  Date/Time:    Ventricular Rate:    PR Interval:    QRS Duration:   QT Interval:    QTC Calculation:   R Axis:     Text Interpretation:        Laboratory Studies: No results found for this or any previous visit (from the past 24 hour(s)). Imaging Studies: CT Lumbar Spine Wo Contrast  Result Date: 01/27/2021 CLINICAL DATA:  Initial evaluation for acute left lower back pain for 1 week. EXAM: CT LUMBAR SPINE WITHOUT CONTRAST TECHNIQUE: Multidetector CT imaging of the lumbar spine was performed without intravenous contrast administration. Multiplanar CT image reconstructions were also generated. COMPARISON:  Prior radiograph from 05/09/2018 FINDINGS: Segmentation: Standard. Lowest well-formed disc space labeled the L5-S1 level. Alignment: Levoscoliosis with apex at L2-3. Straightening of the normal underlying lumbar lordosis. No listhesis. Vertebrae: Compression deformity involving the T12 vertebral body is seen, favored to be chronic in nature. Associated height loss measures up to approximate 50% without significant bony  retropulsion. Otherwise, vertebral body height maintained with no other visible acute or chronic fracture. Visualized sacrum and pelvis intact. SI joints approximated and symmetric. No discrete or worrisome osseous lesions. Paraspinal and other soft tissues: Paraspinous soft tissues demonstrate no acute finding. Advanced aorto bi-iliac atherosclerotic disease noted. Visualized visceral structures otherwise grossly unremarkable. Disc levels: Multilevel disc desiccation without high-grade spinal stenosis noted within the visualized lower thoracic spine. L1-2: Advanced degenerative intervertebral disc space narrowing with disc desiccation and diffuse disc bulge, eccentric to the left. Reactive endplate spurring. Mild facet hypertrophy. Resultant moderate canal with bilateral lateral recess narrowing, worse on the left. Moderate left worse than right L1 foraminal stenosis. L2-3: Advanced degenerative intervertebral disc space narrowing with diffuse disc bulge and reactive endplate spurring. Moderate bilateral facet hypertrophy. Resultant moderate spinal stenosis. Moderate right with mild left L2 foraminal narrowing. L3-4: Advanced degenerative intervertebral disc space narrowing with diffuse disc bulge and disc desiccation. Associated reactive endplate spurring. Moderate bilateral  facet hypertrophy. Resultant severe spinal stenosis, with moderate bilateral L3 foraminal narrowing. L4-5: Degenerative intervertebral disc space narrowing with diffuse disc bulge and disc desiccation. Reactive endplate spurring. Moderate bilateral facet hypertrophy. Resultant severe spinal stenosis, with the thecal sac measuring approximately 3 mm in AP diameter at its most narrow point. Moderate right with severe left L4 foraminal narrowing. L5-S1: Advanced degenerative intervertebral disc space narrowing with diffuse disc bulge and disc desiccation. Reactive endplate spurring. Moderate facet hypertrophy. Resultant moderate canal with right  worse than left lateral recess stenosis. Moderate bilateral L5 foraminal narrowing, worse on the right. IMPRESSION: 1. No acute osseous abnormality within the lumbar spine. If there is continued high clinical suspicion for an underlying occult injury, then further assessment with dedicated MRI would be suggested for further evaluation. 2. Chronic compression fracture of T12 with up to 50% height loss without significant bony retropulsion. 3. No other acute abnormality within the lumbar spine. 4. Levoscoliosis with advanced multilevel degenerative spondylosis as above. Resultant severe spinal stenosis at L3-4 and L4-5. Associated moderate to severe bilateral L1 through L5 foraminal narrowing as above. 5. Aortic Atherosclerosis (ICD10-I70.0). Electronically Signed   By: Jeannine Boga M.D.   On: 01/27/2021 03:13    ED COURSE and MDM  Nursing notes, initial and subsequent vitals signs, including pulse oximetry, reviewed and interpreted by myself.  Vitals:   01/27/21 0056 01/27/21 0145 01/27/21 0215 01/27/21 0245  BP: (!) 155/79 (!) 142/60 131/73 (!) 148/69  Pulse: 77 68 67 68  Resp: 18 (!) 21 17 17   Temp: 99 F (37.2 C)     TempSrc: Oral     SpO2: 94% 94% 94% 95%  Weight: 77.1 kg     Height: 5\' 10"  (1.778 m)      Medications - No data to display  The patient's CT of the lumbar spine is reassuring and I do not believe any additional imaging or work-up is indicated at this time.  We will return him to his living facility.  PROCEDURES  Procedures   ED DIAGNOSES     ICD-10-CM   1. Acute left-sided low back pain without sciatica  M54.50        Aeson Sawyers, Jenny Reichmann, MD 01/27/21 [phone]

## 2021-01-27 NOTE — ED Notes (Signed)
Attempted twice to call Son with test results.

## 2021-01-28 ENCOUNTER — Non-Acute Institutional Stay: Payer: Medicare Other | Admitting: Nurse Practitioner

## 2021-01-28 DIAGNOSIS — Z515 Encounter for palliative care: Secondary | ICD-10-CM | POA: Diagnosis not present

## 2021-01-28 DIAGNOSIS — G894 Chronic pain syndrome: Secondary | ICD-10-CM | POA: Diagnosis not present

## 2021-01-28 NOTE — Progress Notes (Addendum)
[facility] Consult Note Telephone: [phone]  Fax: [phone]    Date of encounter: 01/28/21 PATIENT NAME: Jonathan [name] [place] [id number]   [phone] (home)  DOB: 04/19/1929 MRN: [id number]   PRIMARY CARE PROVIDER:    Lavone Orn, MD,  [facility] [address] [facility] [address]  [place] [address] [phone]  REFERRING PROVIDER:   Earmon Phoenix, NP  RESPONSIBLE PARTY:    Contact Information    Name Relation Home Work [place] Son [phone]  [phone]   Jonathan [name] Daughter   [phone]   [facility] Spouse [phone]        I met face to face with patient in facility.  Palliative Care was asked to follow this patient by consultation request of Earmon Phoenix, NP to address advance care planning and complex medical decision making. This is a follow up visit.                                   ASSESSMENT AND PLAN / RECOMMENDATIONS:   Advance Care Planning/Goals of Care:  Code Status: DNR Goals of Care: Goal of care is comfort. Patient does not desire aggressive treatment for any illness. Directives: Signed DNR and MOST form present on chart in the facility and on [facility] EMR. Details of MOST form include comfort measures, determine use or limitation of antibiotics, IV fluids for defined trial period, no feeding tube.   Symptom Management/Plan: Uncontrolled pain: chronic back pain, new left hip pain radiating to left lower leg. X-ray completed in ED yesterday negative for acute fracture. Patient has received pain management in the past through [facility]. Plan: Continue Dilaudid 45m and Gabapentin 3051mthree times a day. Consider starting patient on Tylenol 50040mnd Ibuprofen 200m61mice a day scheduled together. Consider starting Tizanidine 2mg 21mmouth every 8hrs as needed for spasm. Apply heating pad to site and change routinely. Frequent falls: Patient currently on  physical therapy has poor progression due to pain control issues.  Plan: Continue supportive care. Maintain safety, fall precautions per facility protocol. Offer assistance as needed. Visit update discussed with patient's son [place] [name] made aware that patient does not at this time meet Hospice eligibility criteria as patient does not have a terminal diagnosis and patient's oral intake is 75-100%. Jonathan [name] [place] desire to speak with Hospice team regarding patient's pain management. Son made aware that discussion with Hospice physician would be arranged.  Addendum 01/29/2021 This NP spoke discussed with Hospice physician Dr. Juan-Allie Dimmeragreed to speak with patient's son tomorrow 01/30/2021 at about 11am. Follow up Palliative Care Visit: Palliative care will continue to follow for complex medical decision making, advance care planning, and clarification of goals. Return in 4 weeks or prn.  PPS: 40% weak  HOSPICE ELIGIBILITY/DIAGNOSIS: TBD  CHIEF COMPLAIN: uncontrolled pain  HISTORY OF PRESENT ILLNESS:Jonathan R Jonathan [name] a 89 y.87year oldmalewith multiple medical problems including Dementia (FAST 6c), CVA,BPH, CAD s/p CABG x 4, Type 2 DM, HTN. Patient with left hip and leg pain, ongoing since last fall 10 days ago, described pain as spasm and sharp pain that radiates to his lower leg, pain aggravated by movement, patient unable to quantify pain on 0-10 scale, report pain as "bad". Patient report lying still makes it better. Patient with chronic back pain, and frequent falls related to gait instability. Patient's family verbalized interest in  Hospice care as they desire for patient to be comfortable and pain free, Family report patient has been tried on several pain regimen without significant improvement in his quality of life. Patient current pain med regimen include, Dilaudid 10m three times daily, Gabapentin 3052mthree times a day, Tylenol 100038mvery 8hrs as needed. Patient denied  fever, denied chills, denied SOB, denied chest pain, denied dizziness, no report of constipation. 10 systems reviewed and are negative for acute change, except as noted in the HPI.   History obtained from review of Epic EMR, discussion with facility staff and interview with patient and his son.   I reviewed available labs, medications, imaging, studies and related documents from the EMR.  Records reviewed and summarized above.   Physical Exam: Vital signs: BP 120/60, P69, 93% on room air Current and past weights: 169.4lbs down from 174.8lbs last month, Ht 72f 20f, BMI 24 General: frail appearing, lying in bed in NAD  EYES: anicteric sclera, no discharge  ENMT: intact hearing, oral mucous membranes moist CV: no LE edema Pulmonary: LCTA, no increased work of breathing, no cough, room air Abdomen: intake 75-100%, no ascites GU: deferred MSK: no sarcopenia, moves all extremities, non-ambulatory at this time related to pain Skin: warm and dry, no rashes or wounds on visible skin Neuro:  generalized weakness,  mild cognitive impairment Psych: non-anxious affect, A and O x 2 Hem/lymph/immuno: no widespread bruising  This visit was coded based on medical decision making (MDM).  Thank you for the opportunity to participate in the care of Jonathan [name].  The palliative care team will continue to follow. Please call our office at [phone] can be of additional assistance.   QueeJari FavreP, AGPCNP-BC  COVID-19 PATIENT SCREENING TOOL Asked and negative response unless otherwise noted:   Have you had symptoms of covid, tested positive or been in contact with someone with symptoms/positive test in the past 5-10 days?

## 2021-01-30 DIAGNOSIS — E119 Type 2 diabetes mellitus without complications: Secondary | ICD-10-CM | POA: Diagnosis not present

## 2021-01-30 DIAGNOSIS — M4712 Other spondylosis with myelopathy, cervical region: Secondary | ICD-10-CM | POA: Diagnosis not present

## 2021-01-30 DIAGNOSIS — R42 Dizziness and giddiness: Secondary | ICD-10-CM | POA: Diagnosis not present

## 2021-01-30 DIAGNOSIS — M4716 Other spondylosis with myelopathy, lumbar region: Secondary | ICD-10-CM | POA: Diagnosis not present

## 2021-02-05 DIAGNOSIS — F0390 Unspecified dementia without behavioral disturbance: Secondary | ICD-10-CM | POA: Diagnosis not present

## 2021-02-05 DIAGNOSIS — F32A Depression, unspecified: Secondary | ICD-10-CM | POA: Diagnosis not present

## 2021-02-06 DIAGNOSIS — I4891 Unspecified atrial fibrillation: Secondary | ICD-10-CM | POA: Diagnosis not present

## 2021-02-06 DIAGNOSIS — Z8673 Personal history of transient ischemic attack (TIA), and cerebral infarction without residual deficits: Secondary | ICD-10-CM | POA: Diagnosis not present

## 2021-02-06 DIAGNOSIS — F0150 Vascular dementia without behavioral disturbance: Secondary | ICD-10-CM | POA: Diagnosis not present

## 2021-02-06 DIAGNOSIS — R269 Unspecified abnormalities of gait and mobility: Secondary | ICD-10-CM | POA: Diagnosis not present

## 2021-03-02 ENCOUNTER — Other Ambulatory Visit: Payer: Self-pay

## 2021-03-02 ENCOUNTER — Emergency Department (HOSPITAL_COMMUNITY)
Admission: EM | Admit: 2021-03-02 | Discharge: 2021-03-02 | Disposition: A | Discharge status: Skilled Nursing Facility | Payer: Medicare Other | Attending: Emergency Medicine | Admitting: Emergency Medicine

## 2021-03-02 ENCOUNTER — Emergency Department (HOSPITAL_COMMUNITY): Payer: Medicare Other

## 2021-03-02 ENCOUNTER — Encounter (HOSPITAL_COMMUNITY): Payer: Self-pay | Admitting: Emergency Medicine

## 2021-03-02 DIAGNOSIS — R0902 Hypoxemia: Secondary | ICD-10-CM | POA: Diagnosis not present

## 2021-03-02 DIAGNOSIS — Z23 Encounter for immunization: Secondary | ICD-10-CM | POA: Insufficient documentation

## 2021-03-02 DIAGNOSIS — E119 Type 2 diabetes mellitus without complications: Secondary | ICD-10-CM | POA: Diagnosis not present

## 2021-03-02 DIAGNOSIS — Z87891 Personal history of nicotine dependence: Secondary | ICD-10-CM | POA: Diagnosis not present

## 2021-03-02 DIAGNOSIS — W19XXXA Unspecified fall, initial encounter: Secondary | ICD-10-CM | POA: Insufficient documentation

## 2021-03-02 DIAGNOSIS — I10 Essential (primary) hypertension: Secondary | ICD-10-CM | POA: Diagnosis not present

## 2021-03-02 DIAGNOSIS — S0181XA Laceration without foreign body of other part of head, initial encounter: Secondary | ICD-10-CM | POA: Diagnosis not present

## 2021-03-02 DIAGNOSIS — S0990XA Unspecified injury of head, initial encounter: Secondary | ICD-10-CM | POA: Diagnosis not present

## 2021-03-02 DIAGNOSIS — G319 Degenerative disease of nervous system, unspecified: Secondary | ICD-10-CM | POA: Diagnosis not present

## 2021-03-02 DIAGNOSIS — I2510 Atherosclerotic heart disease of native coronary artery without angina pectoris: Secondary | ICD-10-CM | POA: Insufficient documentation

## 2021-03-02 DIAGNOSIS — Z7901 Long term (current) use of anticoagulants: Secondary | ICD-10-CM | POA: Diagnosis not present

## 2021-03-02 DIAGNOSIS — I119 Hypertensive heart disease without heart failure: Secondary | ICD-10-CM | POA: Diagnosis not present

## 2021-03-02 DIAGNOSIS — I480 Paroxysmal atrial fibrillation: Secondary | ICD-10-CM | POA: Diagnosis not present

## 2021-03-02 DIAGNOSIS — R58 Hemorrhage, not elsewhere classified: Secondary | ICD-10-CM | POA: Diagnosis not present

## 2021-03-02 DIAGNOSIS — I6389 Other cerebral infarction: Secondary | ICD-10-CM | POA: Diagnosis not present

## 2021-03-02 MED ORDER — TETANUS-DIPHTH-ACELL PERTUSSIS 5-2.5-18.5 LF-MCG/0.5 IM SUSY
0.5000 mL | PREFILLED_SYRINGE | Freq: Once | INTRAMUSCULAR | Status: AC
  Administered 2021-03-02: 0.5 mL via INTRAMUSCULAR
  Filled 2021-03-02: qty 0.5

## 2021-03-02 NOTE — ED Triage Notes (Signed)
Per EMS pt had an unwitnessed fall; falling forward into cabinet; unknown if pt had POC; lac to left eye noted; bleeding controlled

## 2021-03-02 NOTE — ED Provider Notes (Addendum)
[facility] EMERGENCY DEPARTMENT Provider Note   CSN: [id number] Arrival date & time: 03/02/21  0143     History Chief Complaint  Patient presents with   Jonathan [name] is a 87 y.o. male.  The history is provided by the EMS personnel. The history is limited by the condition of the patient.  Fall This is a new problem. The current episode started 3 to 5 hours ago. The problem occurs constantly. The problem has been gradually worsening. Pertinent negatives include no chest pain. Nothing aggravates the symptoms. Nothing relieves the symptoms. He has tried nothing for the symptoms. The treatment provided no relief.      Past Medical History:  Diagnosis Date   Allergic rhinitis    BPH (benign prostatic hypertrophy)    Coronary atherosclerosis of native coronary artery    Degeneration of lumbar or lumbosacral intervertebral disc    Diabetes (HCC)    no meds   Dyslipidemia    HOH (hard of hearing)    Hyperlipidemia    Hypertension    Right leg weakness    S/P CABG x 4 2006   LIMA-LAD, SVG-rPDA, sSVG-OM1-OM2   Stroke [facility])    Vascular dementia ([place])    Wears glasses    reading    Patient Active Problem List   Diagnosis Date Noted   Cognitive deficit, post-stroke 01/12/2017   Aphasia, late effect of cerebrovascular disease 01/12/2017   Right hemiparesis (HCC)    Cognitive dysfunction    Coronary artery disease involving coronary bypass graft of native heart without angina pectoris    Expressive aphasia 10/30/2016   Thalamic infarct, acute ([place]) 10/30/2016   Cerebral thrombosis with cerebral infarction 10/29/2016   Acute CVA (cerebrovascular accident) ([place]) 10/29/2016   Syncope 10/28/2016   Long-term (current) use of anticoagulants 10/17/2015   Typical atrial flutter ([place])    Diabetes mellitus ([place]) 09/28/2015   Paroxysmal atrial fibrillation (HCC)    Coronary atherosclerosis of native coronary artery 07/14/2013   Mixed hyperlipidemia  07/14/2013   Hypertensive heart disease without CHF     Past Surgical History:  Procedure Laterality Date   bil carpal tunnel surgery     CARDIAC CATHETERIZATION N/A 10/01/2015   Procedure: Left Heart Cath and Cors/Grafts Angiography;  Surgeon: Peter M Martinique, MD;  Location: [facility];  Service: Cardiovascular;  Laterality: N/A;   COLONOSCOPY     CORONARY ARTERY BYPASS GRAFT  2006     Coronary artery bypass grafting x 4 (left internal mammary   DUPUYTREN CONTRACTURE RELEASE Left 04/05/2014   Procedure: EXCISION DUPUYTREN LEFT PALM, RING, SMALL, AND THUMB;  Surgeon: Cammie Sickle, MD;  Location: [place] [facility];  Service: Orthopedics;  Laterality: Left;   EYE SURGERY     both cataracts   HEMORRHOID SURGERY     LAMINECTOMY     ROTATOR CUFF REPAIR     TONSILLECTOMY     TRANSURETHRAL RESECTION OF PROSTATE         Family History  Problem Relation Age of Onset   Hypertension Father     Social History   Tobacco Use   Smoking status: Former Smoker   Smokeless tobacco: Never Used   Tobacco comment: Quit 1958  Substance Use Topics   Alcohol use: No    Alcohol/week: 0.0 standard drinks   Drug use: No    Home Medications Prior to Admission medications   Medication Sig Start Date End Date Taking? Authorizing  Provider  apixaban (ELIQUIS) 5 MG TABS tablet Take 1 tablet (5 mg total) by mouth 2 (two) times daily. 11/10/16   Angiulli, Lavon Paganini, PA-C  glipiZIDE (GLUCOTROL) 5 MG tablet Take 1 tablet (5 mg total) by mouth daily before breakfast. 11/10/16   Angiulli, Lavon Paganini, PA-C  isosorbide mononitrate (IMDUR) 30 MG 24 hr tablet Take 0.5 tablets (15 mg total) by mouth daily. 11/10/16   Angiulli, Lavon Paganini, PA-C  linaclotide (LINZESS) 290 MCG CAPS capsule Take 1 capsule (290 mcg total) by mouth daily before breakfast. 11/11/16   Angiulli, Lavon Paganini, PA-C  pravastatin (PRAVACHOL) 20 MG tablet Take 1 tablet (20 mg total) by mouth daily at 6 PM. 11/10/16   Angiulli, Lavon Paganini, PA-C    Allergies    Amlodipine, Codeine, Metformin and related, Metoprolol, Statins, and Welchol [colesevelam hcl]  Review of Systems   Review of Systems  Unable to perform ROS: Dementia  Constitutional:  Negative for fever.  HENT:  Negative for facial swelling.   Eyes:  Negative for redness.  Cardiovascular:  Negative for chest pain.  Skin:  Positive for wound.  Psychiatric/Behavioral:  Negative for agitation.    Physical Exam Updated Vital Signs BP 132/79 (BP Location: Left Arm)   Pulse 80   Temp (!) 97.2 F (36.2 C) (Oral)   Resp 17   Ht 5\' 10"  (1.778 m)   Wt 77.1 kg   SpO2 100%   BMI 24.39 kg/m   Physical Exam Vitals and nursing note reviewed.  Constitutional:      General: He is not in acute distress.    Appearance: Normal appearance.  HENT:     Head: Normocephalic.      Right Ear: Tympanic membrane normal.     Left Ear: Tympanic membrane normal.     Nose: Nose normal.     Mouth/Throat:     Mouth: Mucous membranes are moist.     Pharynx: Oropharynx is clear.  Eyes:     Conjunctiva/sclera: Conjunctivae normal.     Pupils: Pupils are equal, round, and reactive to light.  Cardiovascular:     Rate and Rhythm: Normal rate and regular rhythm.     Pulses: Normal pulses.     Heart sounds: Normal heart sounds.  Pulmonary:     Effort: Pulmonary effort is normal.     Breath sounds: Normal breath sounds.  Abdominal:     General: Abdomen is flat. Bowel sounds are normal.     Palpations: Abdomen is soft.     Tenderness: There is no abdominal tenderness. There is no guarding.  Musculoskeletal:        General: No tenderness or deformity. Normal range of motion.     Right wrist: Normal. No snuff box tenderness.     Left wrist: Normal. No snuff box tenderness.     Right hand: Normal. Normal capillary refill. Normal pulse.     Left hand: Normal. Normal capillary refill. Normal pulse.     Cervical back: Normal, normal range of motion and neck supple. No rigidity or  tenderness.     Thoracic back: Normal.     Lumbar back: Normal.     Right hip: Normal.     Left hip: Normal.     Right knee: Normal.     Left knee: Normal.     Right ankle: Normal.     Right Achilles Tendon: Normal.     Left ankle: Normal.     Left Achilles Tendon: Normal.  Right foot: Normal.     Left foot: Normal.     Comments: Pelvis is stable, B hips are well seated and non-tender.  No foreshortening no rotation.    Lymphadenopathy:     Cervical: No cervical adenopathy.  Skin:    General: Skin is warm and dry.     Capillary Refill: Capillary refill takes less than 2 seconds.  Neurological:     Mental Status: He is alert.     Deep Tendon Reflexes: Reflexes normal.  Psychiatric:        Mood and Affect: Mood normal.    ED Results / Procedures / Treatments   Labs (all labs ordered are listed, but only abnormal results are displayed) Labs Reviewed - No data to display  EKG EKG Interpretation  Date/Time:  Sunday Mar 02 2021 01:45:12 EDT Ventricular Rate:  77 PR Interval:  182 QRS Duration: 113 QT Interval:  420 QTC Calculation: 476 R Axis:   -2 Text Interpretation: Sinus rhythm LVH with IVCD and secondary repol abnrm Confirmed by Randal Buba, Kerrigan Glendening ([id number]) on 03/02/2021 1:48:16 AM   Radiology Results for orders placed or performed during the hospital encounter of 10/30/16  Urine culture   Specimen: Urine, Random  Result Value Ref Range   Specimen Description URINE, RANDOM    Special Requests NONE    Culture NO GROWTH    Report Status 11/02/2016 FINAL   Urine culture   Specimen: Urine, Random  Result Value Ref Range   Specimen Description URINE, RANDOM    Special Requests NONE    Culture <10,000 COLONIES/mL INSIGNIFICANT GROWTH (A)    Report Status 11/11/2016 FINAL   Glucose, capillary  Result Value Ref Range   Glucose-Capillary 221 (H) 65 - 99 mg/dL   Comment 1 Notify RN   Glucose, capillary  Result Value Ref Range   Glucose-Capillary 162 (H) 65 - 99  mg/dL   Comment 1 Notify RN   Glucose, capillary  Result Value Ref Range   Glucose-Capillary 178 (H) 65 - 99 mg/dL  Glucose, capillary  Result Value Ref Range   Glucose-Capillary 133 (H) 65 - 99 mg/dL  Glucose, capillary  Result Value Ref Range   Glucose-Capillary 120 (H) 65 - 99 mg/dL  Glucose, capillary  Result Value Ref Range   Glucose-Capillary 150 (H) 65 - 99 mg/dL  CBC WITH DIFFERENTIAL  Result Value Ref Range   WBC 8.6 4.0 - 10.5 K/uL   RBC 4.91 4.22 - 5.81 MIL/uL   Hemoglobin 12.7 (L) 13.0 - 17.0 g/dL   HCT 39.1 39.0 - 52.0 %   MCV 79.6 78.0 - 100.0 fL   MCH 25.9 (L) 26.0 - 34.0 pg   MCHC 32.5 30.0 - 36.0 g/dL   RDW 13.6 11.5 - 15.5 %   Platelets 258 150 - 400 K/uL   Neutrophils Relative % 77 %   Neutro Abs 6.7 1.7 - 7.7 K/uL   Lymphocytes Relative 15 %   Lymphs Abs 1.3 0.7 - 4.0 K/uL   Monocytes Relative 6 %   Monocytes Absolute 0.5 0.1 - 1.0 K/uL   Eosinophils Relative 1 %   Eosinophils Absolute 0.1 0.0 - 0.7 K/uL   Basophils Relative 1 %   Basophils Absolute 0.1 0.0 - 0.1 K/uL  Comprehensive metabolic panel  Result Value Ref Range   Sodium 138 135 - 145 mmol/L   Potassium 4.1 3.5 - 5.1 mmol/L   Chloride 103 101 - 111 mmol/L   CO2 26 22 - 32 mmol/L  Glucose, Bld 166 (H) 65 - 99 mg/dL   BUN 18 6 - 20 mg/dL   Creatinine, Ser 0.70 0.61 - 1.24 mg/dL   Calcium 9.0 8.9 - 10.3 mg/dL   Total Protein 6.8 6.5 - 8.1 g/dL   Albumin 3.6 3.5 - 5.0 g/dL   AST 17 15 - 41 U/L   ALT 13 (L) 17 - 63 U/L   Alkaline Phosphatase 71 38 - 126 U/L   Total Bilirubin 0.4 0.3 - 1.2 mg/dL   GFR calc non Af Amer >60 >60 mL/min   GFR calc Af Amer >60 >60 mL/min   Anion gap 9 5 - 15  Urinalysis, Routine w reflex microscopic  Result Value Ref Range   Color, Urine YELLOW YELLOW   APPearance CLEAR CLEAR   Specific Gravity, Urine 1.011 1.005 - 1.030   pH 6.0 5.0 - 8.0   Glucose, UA NEGATIVE NEGATIVE mg/dL   Hgb urine dipstick NEGATIVE NEGATIVE   Bilirubin Urine NEGATIVE NEGATIVE    Ketones, ur NEGATIVE NEGATIVE mg/dL   Protein, ur NEGATIVE NEGATIVE mg/dL   Nitrite NEGATIVE NEGATIVE   Leukocytes, UA NEGATIVE NEGATIVE  Glucose, capillary  Result Value Ref Range   Glucose-Capillary 200 (H) 65 - 99 mg/dL  Glucose, capillary  Result Value Ref Range   Glucose-Capillary 194 (H) 65 - 99 mg/dL  Glucose, capillary  Result Value Ref Range   Glucose-Capillary 133 (H) 65 - 99 mg/dL  Glucose, capillary  Result Value Ref Range   Glucose-Capillary 142 (H) 65 - 99 mg/dL  Glucose, capillary  Result Value Ref Range   Glucose-Capillary 152 (H) 65 - 99 mg/dL  Glucose, capillary  Result Value Ref Range   Glucose-Capillary 127 (H) 65 - 99 mg/dL  Glucose, capillary  Result Value Ref Range   Glucose-Capillary 170 (H) 65 - 99 mg/dL  Glucose, capillary  Result Value Ref Range   Glucose-Capillary 129 (H) 65 - 99 mg/dL  Glucose, capillary  Result Value Ref Range   Glucose-Capillary 187 (H) 65 - 99 mg/dL  Glucose, capillary  Result Value Ref Range   Glucose-Capillary 177 (H) 65 - 99 mg/dL  Glucose, capillary  Result Value Ref Range   Glucose-Capillary 142 (H) 65 - 99 mg/dL  Glucose, capillary  Result Value Ref Range   Glucose-Capillary 127 (H) 65 - 99 mg/dL  Glucose, capillary  Result Value Ref Range   Glucose-Capillary 148 (H) 65 - 99 mg/dL  Glucose, capillary  Result Value Ref Range   Glucose-Capillary 113 (H) 65 - 99 mg/dL   Comment 1 Notify RN   Glucose, capillary  Result Value Ref Range   Glucose-Capillary 128 (H) 65 - 99 mg/dL   Comment 1 Notify RN   Glucose, capillary  Result Value Ref Range   Glucose-Capillary 101 (H) 65 - 99 mg/dL  Glucose, capillary  Result Value Ref Range   Glucose-Capillary 99 65 - 99 mg/dL  Glucose, capillary  Result Value Ref Range   Glucose-Capillary 170 (H) 65 - 99 mg/dL   Comment 1 Notify RN   Glucose, capillary  Result Value Ref Range   Glucose-Capillary 103 (H) 65 - 99 mg/dL   Comment 1 Notify RN   Glucose, capillary   Result Value Ref Range   Glucose-Capillary 135 (H) 65 - 99 mg/dL  Glucose, capillary  Result Value Ref Range   Glucose-Capillary 105 (H) 65 - 99 mg/dL  Glucose, capillary  Result Value Ref Range   Glucose-Capillary 118 (H) 65 - 99 mg/dL  Comment 1 Notify RN   Glucose, capillary  Result Value Ref Range   Glucose-Capillary 93 65 - 99 mg/dL  Glucose, capillary  Result Value Ref Range   Glucose-Capillary 264 (H) 65 - 99 mg/dL  Glucose, capillary  Result Value Ref Range   Glucose-Capillary 98 65 - 99 mg/dL   Comment 1 Notify RN   Glucose, capillary  Result Value Ref Range   Glucose-Capillary 146 (H) 65 - 99 mg/dL   Comment 1 Notify RN   Glucose, capillary  Result Value Ref Range   Glucose-Capillary 75 65 - 99 mg/dL   Comment 1 Notify RN   Glucose, capillary  Result Value Ref Range   Glucose-Capillary 153 (H) 65 - 99 mg/dL  Glucose, capillary  Result Value Ref Range   Glucose-Capillary 161 (H) 65 - 99 mg/dL  Glucose, capillary  Result Value Ref Range   Glucose-Capillary 102 (H) 65 - 99 mg/dL  Glucose, capillary  Result Value Ref Range   Glucose-Capillary 124 (H) 65 - 99 mg/dL  Glucose, capillary  Result Value Ref Range   Glucose-Capillary 132 (H) 65 - 99 mg/dL   Comment 1 Notify RN   Glucose, capillary  Result Value Ref Range   Glucose-Capillary 126 (H) 65 - 99 mg/dL  Glucose, capillary  Result Value Ref Range   Glucose-Capillary 129 (H) 65 - 99 mg/dL  Glucose, capillary  Result Value Ref Range   Glucose-Capillary 139 (H) 65 - 99 mg/dL  Glucose, capillary  Result Value Ref Range   Glucose-Capillary 104 (H) 65 - 99 mg/dL  Urinalysis, Routine w reflex microscopic  Result Value Ref Range   Color, Urine YELLOW YELLOW   APPearance CLEAR CLEAR   Specific Gravity, Urine 1.023 1.005 - 1.030   pH 5.0 5.0 - 8.0   Glucose, UA NEGATIVE NEGATIVE mg/dL   Hgb urine dipstick NEGATIVE NEGATIVE   Bilirubin Urine NEGATIVE NEGATIVE   Ketones, ur NEGATIVE NEGATIVE mg/dL    Protein, ur NEGATIVE NEGATIVE mg/dL   Nitrite NEGATIVE NEGATIVE   Leukocytes, UA NEGATIVE NEGATIVE  CBC  Result Value Ref Range   WBC 8.3 4.0 - 10.5 K/uL   RBC 4.99 4.22 - 5.81 MIL/uL   Hemoglobin 13.1 13.0 - 17.0 g/dL   HCT 40.0 39.0 - 52.0 %   MCV 80.2 78.0 - 100.0 fL   MCH 26.3 26.0 - 34.0 pg   MCHC 32.8 30.0 - 36.0 g/dL   RDW 13.6 11.5 - 15.5 %   Platelets 259 150 - 400 K/uL   CT Head Wo Contrast  Result Date: 03/02/2021 CLINICAL DATA:  Fall EXAM: CT HEAD WITHOUT CONTRAST CT CERVICAL SPINE WITHOUT CONTRAST TECHNIQUE: Multidetector CT imaging of the head and cervical spine was performed following the standard protocol without intravenous contrast. Multiplanar CT image reconstructions of the cervical spine were also generated. COMPARISON:  None. FINDINGS: CT HEAD FINDINGS Brain: There is no mass, hemorrhage or extra-axial collection. There is generalized atrophy without lobar predilection. Old left occipital infarct, left thalamic small vessel infarct and left cerebellar infarct. There is periventricular hypoattenuation compatible with chronic microvascular disease. Vascular: No abnormal hyperdensity of the major intracranial arteries or dural venous sinuses. No intracranial atherosclerosis. Skull: The visualized skull base, calvarium and extracranial soft tissues are normal. Sinuses/Orbits: No fluid levels or advanced mucosal thickening of the visualized paranasal sinuses. No mastoid or middle ear effusion. The orbits are normal. CT CERVICAL SPINE FINDINGS Alignment: No static subluxation. Facets are aligned. Occipital condyles are normally positioned. Skull base and  vertebrae: No acute fracture. Soft tissues and spinal canal: No prevertebral fluid or swelling. No visible canal hematoma. Disc levels: No advanced spinal canal or neural foraminal stenosis. Upper chest: No pneumothorax, pulmonary nodule or pleural effusion. Other: Normal visualized paraspinal cervical soft tissues. IMPRESSION: 1. No  acute intracranial abnormality. 2. Multiple old infarcts and findings of chronic small vessel ischemia. 3. No acute fracture or static subluxation of the cervical spine. Electronically Signed   By: Ulyses Jarred M.D.   On: 03/02/2021 04:05   CT Cervical Spine Wo Contrast  Result Date: 03/02/2021 CLINICAL DATA:  Fall EXAM: CT HEAD WITHOUT CONTRAST CT CERVICAL SPINE WITHOUT CONTRAST TECHNIQUE: Multidetector CT imaging of the head and cervical spine was performed following the standard protocol without intravenous contrast. Multiplanar CT image reconstructions of the cervical spine were also generated. COMPARISON:  None. FINDINGS: CT HEAD FINDINGS Brain: There is no mass, hemorrhage or extra-axial collection. There is generalized atrophy without lobar predilection. Old left occipital infarct, left thalamic small vessel infarct and left cerebellar infarct. There is periventricular hypoattenuation compatible with chronic microvascular disease. Vascular: No abnormal hyperdensity of the major intracranial arteries or dural venous sinuses. No intracranial atherosclerosis. Skull: The visualized skull base, calvarium and extracranial soft tissues are normal. Sinuses/Orbits: No fluid levels or advanced mucosal thickening of the visualized paranasal sinuses. No mastoid or middle ear effusion. The orbits are normal. CT CERVICAL SPINE FINDINGS Alignment: No static subluxation. Facets are aligned. Occipital condyles are normally positioned. Skull base and vertebrae: No acute fracture. Soft tissues and spinal canal: No prevertebral fluid or swelling. No visible canal hematoma. Disc levels: No advanced spinal canal or neural foraminal stenosis. Upper chest: No pneumothorax, pulmonary nodule or pleural effusion. Other: Normal visualized paraspinal cervical soft tissues. IMPRESSION: 1. No acute intracranial abnormality. 2. Multiple old infarcts and findings of chronic small vessel ischemia. 3. No acute fracture or static  subluxation of the cervical spine. Electronically Signed   By: Ulyses Jarred M.D.   On: 03/02/2021 04:05    Procedures .Marland KitchenLaceration Repair  Date/Time: 03/02/2021 1:59 AM Performed by: Veatrice Kells, MD Authorized by: Veatrice Kells, MD   Consent:    Consent obtained:  Emergent situation and verbal   Consent given by:  Patient   Risks, benefits, and alternatives were discussed: yes     Risks discussed:  Infection, need for additional repair, pain, poor cosmetic result and poor wound healing   Alternatives discussed:  No treatment Universal protocol:    Patient identity confirmed:  Arm band Anesthesia:    Anesthesia method:  None Laceration details:    Location:  Face   Face location:  Forehead   Length (cm):  3   Depth (mm):  1 Pre-procedure details:    Preparation:  Patient was prepped and draped in usual sterile fashion Exploration:    Limited defect created (wound extended): no     Hemostasis achieved with:  Direct pressure   Imaging obtained: x-ray     Imaging outcome: foreign body not noted     Wound extent: no areolar tissue violation noted   Treatment:    Area cleansed with:  Chlorhexidine   Amount of cleaning:  Extensive   Irrigation solution:  Sterile saline   Debridement:  None   Undermining:  None   Scar revision: no   Skin repair:    Repair method:  Tissue adhesive Approximation:    Approximation:  Close Repair type:    Repair type:  Simple Post-procedure details:  Dressing:  Open (no dressing)   Procedure completion:  Tolerated well, no immediate complications   Medications Ordered in ED Medications  Tdap (BOOSTRIX) injection 0.5 mL (has no administration in time range)    ED Course  I have reviewed the triage vital signs and the nursing notes.  Pertinent labs & imaging results that were available during my care of the patient were reviewed by me and considered in my medical decision making (see chart for details).    Kolten Pappa Mcevoy was  evaluated in Emergency Department on 03/02/2021 for the symptoms described in the history of present illness. He was evaluated in the context of the global COVID-19 pandemic, which necessitated consideration that the patient might be at risk for infection with the SARS-CoV-2 virus that causes COVID-19. Institutional protocols and algorithms that pertain to the evaluation of patients at risk for COVID-19 are in a state of rapid change based on information released by regulatory bodies including the CDC and federal and state organizations. These policies and algorithms were followed during the patient's care in the ED.  Final Clinical Impression(s) / ED Diagnoses  Return for intractable cough, coughing up blood, fevers > 100.4 unrelieved by medication, shortness of breath, intractable vomiting, chest pain, shortness of breath, weakness, numbness, changes in speech, facial asymmetry, abdominal pain, passing out, Inability to tolerate liquids or food, cough, altered mental status or any concerns. No signs of systemic illness or infection. The patient is nontoxic-appearing on exam and vital signs are within normal limits.  I have reviewed the triage vital signs and the nursing notes. Pertinent labs & imaging results that were available during my care of the patient were reviewed by me and considered in my medical decision making (see chart for details). After history, exam, and medical workup I feel the patient has been appropriately medically screened and is safe for discharge home. Pertinent diagnoses were discussed with the patient. Patient was given return precautions.         Esmerelda Finnigan, MD 03/02/21 [place], Arlie Riker, MD 03/28/21 [id number]

## 2021-03-02 NOTE — ED Notes (Signed)
[facility] called  

## 2021-03-19 DIAGNOSIS — R109 Unspecified abdominal pain: Secondary | ICD-10-CM | POA: Diagnosis not present

## 2021-05-19 DIAGNOSIS — E119 Type 2 diabetes mellitus without complications: Secondary | ICD-10-CM | POA: Diagnosis not present

## 2021-05-19 DIAGNOSIS — I10 Essential (primary) hypertension: Secondary | ICD-10-CM | POA: Diagnosis not present

## 2021-05-19 DIAGNOSIS — D519 Vitamin B12 deficiency anemia, unspecified: Secondary | ICD-10-CM | POA: Diagnosis not present

## 2021-05-19 DIAGNOSIS — E039 Hypothyroidism, unspecified: Secondary | ICD-10-CM | POA: Diagnosis not present

## 2021-09-03 DIAGNOSIS — R0989 Other specified symptoms and signs involving the circulatory and respiratory systems: Secondary | ICD-10-CM | POA: Diagnosis not present

## 2021-09-04 DIAGNOSIS — R55 Syncope and collapse: Secondary | ICD-10-CM | POA: Diagnosis not present

## 2021-09-04 DIAGNOSIS — K6289 Other specified diseases of anus and rectum: Secondary | ICD-10-CM | POA: Diagnosis not present

## 2021-09-04 DIAGNOSIS — R8281 Pyuria: Secondary | ICD-10-CM | POA: Diagnosis not present

## 2021-09-04 DIAGNOSIS — D72829 Elevated white blood cell count, unspecified: Secondary | ICD-10-CM | POA: Diagnosis not present

## 2021-09-24 DIAGNOSIS — F0390 Unspecified dementia without behavioral disturbance: Secondary | ICD-10-CM | POA: Diagnosis not present

## 2021-09-24 DIAGNOSIS — F32A Depression, unspecified: Secondary | ICD-10-CM | POA: Diagnosis not present

## 2022-06-08 DIAGNOSIS — F03B2 Unspecified dementia, moderate, with psychotic disturbance: Secondary | ICD-10-CM | POA: Diagnosis not present

## 2022-06-08 DIAGNOSIS — F32A Depression, unspecified: Secondary | ICD-10-CM | POA: Diagnosis not present

## 2022-07-12 DEATH — deceased

## 2022-07-14 ENCOUNTER — Telehealth: Payer: Self-pay

## 2022-07-14 NOTE — Patient Outreach (Signed)
  Care Coordination   07/14/2022 Name: Jonathan [name] MRN: [id number] DOB: 06/01/1929   Care Coordination Outreach Attempts:  An unsuccessful telephone outreach was attempted today to offer the patient information about available care coordination services as a benefit of their health plan.   Follow Up Plan:  Additional outreach attempts will be made to offer the patient care coordination information and services.   Encounter Outcome:  No Answer  Care Coordination Interventions Activated:  No   Care Coordination Interventions:  No, not indicated    [facility] [phone]
